# Patient Record
Sex: Female | Born: 1935 | Race: Black or African American | Hispanic: No | Smoking: Never smoker
Health system: Southern US, Community
[De-identification: ages and names within clinical notes are randomized; demographics above are authoritative.]

## PROBLEM LIST (undated history)

## (undated) DIAGNOSIS — I10 Essential (primary) hypertension: Secondary | ICD-10-CM

## (undated) DIAGNOSIS — E118 Type 2 diabetes mellitus with unspecified complications: Secondary | ICD-10-CM

## (undated) DIAGNOSIS — E785 Hyperlipidemia, unspecified: Secondary | ICD-10-CM

## (undated) DIAGNOSIS — I2510 Atherosclerotic heart disease of native coronary artery without angina pectoris: Secondary | ICD-10-CM

## (undated) DIAGNOSIS — K635 Polyp of colon: Secondary | ICD-10-CM

## (undated) DIAGNOSIS — H409 Unspecified glaucoma: Secondary | ICD-10-CM

## (undated) HISTORY — PX: APPENDECTOMY: SHX54

## (undated) HISTORY — PX: PARATHYROIDECTOMY: SHX19

## (undated) HISTORY — DX: Polyp of colon: K63.5

## (undated) HISTORY — PX: CORONARY ANGIOPLASTY: SHX604

---

## 1997-12-22 HISTORY — PX: BREAST BIOPSY: SHX20

## 2004-09-30 ENCOUNTER — Ambulatory Visit: Payer: Self-pay | Admitting: Unknown Physician Specialty

## 2005-01-07 ENCOUNTER — Ambulatory Visit: Payer: Self-pay | Admitting: Unknown Physician Specialty

## 2005-08-26 ENCOUNTER — Ambulatory Visit: Payer: Self-pay | Admitting: Unknown Physician Specialty

## 2005-10-01 ENCOUNTER — Ambulatory Visit: Payer: Self-pay | Admitting: Otolaryngology

## 2006-09-01 ENCOUNTER — Ambulatory Visit: Payer: Self-pay | Admitting: Unknown Physician Specialty

## 2007-09-06 ENCOUNTER — Ambulatory Visit: Payer: Self-pay | Admitting: Unknown Physician Specialty

## 2008-03-06 ENCOUNTER — Ambulatory Visit: Payer: Self-pay | Admitting: Unknown Physician Specialty

## 2008-04-17 ENCOUNTER — Ambulatory Visit: Payer: Self-pay | Admitting: Gastroenterology

## 2008-05-15 ENCOUNTER — Emergency Department: Payer: Self-pay | Admitting: Internal Medicine

## 2008-09-07 ENCOUNTER — Ambulatory Visit: Payer: Self-pay | Admitting: Unknown Physician Specialty

## 2008-09-11 ENCOUNTER — Ambulatory Visit: Payer: Self-pay | Admitting: Unknown Physician Specialty

## 2008-12-22 DIAGNOSIS — K635 Polyp of colon: Secondary | ICD-10-CM

## 2008-12-22 HISTORY — PX: COLONOSCOPY: SHX174

## 2008-12-22 HISTORY — DX: Polyp of colon: K63.5

## 2009-12-11 ENCOUNTER — Ambulatory Visit: Payer: Self-pay | Admitting: Internal Medicine

## 2010-12-12 ENCOUNTER — Ambulatory Visit: Payer: Self-pay | Admitting: Internal Medicine

## 2012-01-01 ENCOUNTER — Ambulatory Visit: Payer: Self-pay | Admitting: Internal Medicine

## 2012-07-14 ENCOUNTER — Ambulatory Visit: Payer: Self-pay | Admitting: Ophthalmology

## 2012-09-15 ENCOUNTER — Ambulatory Visit: Payer: Self-pay | Admitting: Ophthalmology

## 2013-01-12 ENCOUNTER — Ambulatory Visit: Payer: Self-pay | Admitting: Internal Medicine

## 2013-01-14 ENCOUNTER — Ambulatory Visit: Payer: Self-pay | Admitting: Internal Medicine

## 2014-01-13 ENCOUNTER — Ambulatory Visit: Payer: Self-pay | Admitting: Internal Medicine

## 2014-09-05 ENCOUNTER — Encounter: Payer: Self-pay | Admitting: *Deleted

## 2014-09-18 ENCOUNTER — Ambulatory Visit (INDEPENDENT_AMBULATORY_CARE_PROVIDER_SITE_OTHER): Payer: Medicare Other | Admitting: General Surgery

## 2014-09-18 ENCOUNTER — Encounter: Payer: Self-pay | Admitting: General Surgery

## 2014-09-18 VITALS — BP 120/80 | HR 84 | Resp 12 | Ht 66.0 in | Wt 165.0 lb

## 2014-09-18 DIAGNOSIS — Z1211 Encounter for screening for malignant neoplasm of colon: Secondary | ICD-10-CM

## 2014-09-18 DIAGNOSIS — D649 Anemia, unspecified: Secondary | ICD-10-CM

## 2014-09-18 MED ORDER — POLYETHYLENE GLYCOL 3350 17 GM/SCOOP PO POWD: ORAL | Status: DC

## 2014-09-18 NOTE — Patient Instructions (Addendum)
Colonoscopy A colonoscopy is an exam to look at the entire large intestine (colon). This exam can help find problems such as tumors, polyps, inflammation, and areas of bleeding. The exam takes about 1 hour.  LET [facility] CARE PROVIDER KNOW ABOUT:   Any allergies you have.  All medicines you are taking, including vitamins, herbs, eye drops, creams, and over-the-counter medicines.  Previous problems you or members of your family have had with the use of anesthetics.  Any blood disorders you have.  Previous surgeries you have had.  Medical conditions you have. RISKS AND COMPLICATIONS  Generally, this is a safe procedure. However, as with any procedure, complications can occur. Possible complications include:  Bleeding.  Tearing or rupture of the colon wall.  Reaction to medicines given during the exam.  Infection (rare). BEFORE THE PROCEDURE   Ask your health care provider about changing or stopping your regular medicines.  You may be prescribed an oral bowel prep. This involves drinking a large amount of medicated liquid, starting the day before your procedure. The liquid will cause you to have multiple loose stools until your stool is almost clear or light green. This cleans out your colon in preparation for the procedure.  Do not eat or drink anything else once you have started the bowel prep, unless your health care provider tells you it is safe to do so.  Arrange for someone to drive you home after the procedure. PROCEDURE   You will be given medicine to help you relax (sedative).  You will lie on your side with your knees bent.  A long, flexible tube with a light and camera on the end (colonoscope) will be inserted through the rectum and into the colon. The camera sends video back to a computer screen as it moves through the colon. The colonoscope also releases carbon dioxide gas to inflate the colon. This helps your health care provider see the area better.  During  the exam, your health care provider may take a small tissue sample (biopsy) to be examined under a microscope if any abnormalities are found.  The exam is finished when the entire colon has been viewed. AFTER THE PROCEDURE   Do not drive for 24 hours after the exam.  You may have a small amount of blood in your stool.  You may pass moderate amounts of gas and have mild abdominal cramping or bloating. This is caused by the gas used to inflate your colon during the exam.  Ask when your test results will be ready and how you will get your results. Make sure you get your test results. Document Released: 12/05/2000 Document Revised: 09/28/2013 Document Reviewed: 08/15/2013 [facility] Patient Information 2015 [place], [place]. This information is not intended to replace advice given to you by your health care provider. Make sure you discuss any questions you have with your health care provider.  Patient has been scheduled for an upper and lower endoscopy on 09-27-14 at [facility]. This patient has been asked to hold Onglyza day of prep and procedure.

## 2014-09-18 NOTE — Progress Notes (Addendum)
Patient ID: [name] [name], female   DOB: 1935-05-23, 79 y.o.   MRN: [id number]  Chief Complaint  Patient presents with  . Other    New Pt evaluation for screening colonoscopy    HPI [name] [name] is a 79 y.o. female who presents for an evaluation of a colonoscopy. Her last colonoscopy was performed at [facility] in 2010. The patient has a personal history of colon polyps. She denies any problems with the bowels at this time.   HPI  Past Medical History  Diagnosis Date  . Diabetes mellitus without complication   . Colon polyp 2010    Past Surgical History  Procedure Laterality Date  . Colonoscopy  2010  . Breast biopsy Right 1999    History reviewed. No pertinent family history.  Social History History  Substance Use Topics  . Smoking status: Never Smoker   . Smokeless tobacco: Never Used  . Alcohol Use: No    No Known Allergies  Current Outpatient Prescriptions  Medication Sig Dispense Refill  . acetaminophen (TYLENOL) 500 MG tablet Take 500 mg by mouth every 6 (six) hours as needed.      . Amlodipine-Valsartan-HCTZ 10-320-25 MG TABS Take 1 tablet by mouth daily.      . Calcium Carbonate-Vitamin D (CALCIUM + D PO) Take 1 tablet by mouth daily.      . Cholecalciferol (VITAMIN D-3) 1000 UNITS CAPS Take 1 capsule by mouth 2 (two) times daily.      . ONGLYZA 5 MG TABS tablet Take 1 tablet by mouth daily.      Marland Kitchen VIMOVO 500-20 MG TBEC Take 1 tablet by mouth daily.      . polyethylene glycol powder (GLYCOLAX/MIRALAX) powder 255 grams one bottle for colonoscopy prep  255 g  0   No current facility-administered medications for this visit.    Review of Systems Review of Systems  Constitutional: Negative.   Respiratory: Negative.   Cardiovascular: Negative.   Gastrointestinal: Negative.     Blood pressure 120/80, pulse 84, resp. rate 12, height 5\' 6"  (1.676 m), weight 165 lb (74.844 kg).  Physical Exam Physical Exam  Constitutional: She is oriented to person, place,  and time. She appears well-developed and well-nourished.  Eyes: Conjunctivae are normal. No scleral icterus.  Neck: Neck supple.  Cardiovascular: Normal rate, regular rhythm and normal heart sounds.   Pulmonary/Chest: Effort normal and breath sounds normal.  Abdominal: Soft. Normal appearance and bowel sounds are normal. There is no tenderness.  Neurological: She is alert and oriented to person, place, and time.  Skin: Skin is warm and dry.    Data Reviewed Full discussion with PCP.  Colonoscopy dated April 17, 2008 showed 2-9 mm polyps in the cecum which showed active inflammation and crypt hyperplasia. No granulomas.  Assessment    Microcytic anemia her PCP report.     Plan    Patient has been scheduled for an upper and lower endoscopy on 09-27-14 at [facility]. This patient has been asked to hold Onglyza day of prep and procedure.      PCP/Ref MD: Henderson Baltimore 09/19/2014, 8:32 PM

## 2014-09-19 ENCOUNTER — Other Ambulatory Visit: Payer: Self-pay | Admitting: General Surgery

## 2014-09-19 DIAGNOSIS — D649 Anemia, unspecified: Secondary | ICD-10-CM | POA: Insufficient documentation

## 2014-09-19 DIAGNOSIS — Z1211 Encounter for screening for malignant neoplasm of colon: Secondary | ICD-10-CM

## 2014-09-26 ENCOUNTER — Ambulatory Visit (INDEPENDENT_AMBULATORY_CARE_PROVIDER_SITE_OTHER): Payer: Medicare Other | Admitting: *Deleted

## 2014-09-26 DIAGNOSIS — D649 Anemia, unspecified: Secondary | ICD-10-CM

## 2014-09-26 LAB — POC HEMOCCULT BLD/STL (HOME/3-CARD/SCREEN)
CARD #2 DATE: NEGATIVE
Card #3 Date: NEGATIVE
OCCULT BLOOD DATE: NEGATIVE

## 2014-09-26 NOTE — Patient Instructions (Signed)
The patient is aware to call back for any questions or concerns.  

## 2014-09-26 NOTE — Progress Notes (Signed)
stool cards negative

## 2014-09-27 ENCOUNTER — Ambulatory Visit: Payer: Self-pay | Admitting: General Surgery

## 2014-09-27 DIAGNOSIS — D509 Iron deficiency anemia, unspecified: Secondary | ICD-10-CM

## 2014-09-27 DIAGNOSIS — D120 Benign neoplasm of cecum: Secondary | ICD-10-CM

## 2014-09-28 ENCOUNTER — Encounter: Payer: Self-pay | Admitting: General Surgery

## 2014-09-29 ENCOUNTER — Encounter: Payer: Self-pay | Admitting: General Surgery

## 2014-09-29 LAB — PATHOLOGY REPORT

## 2014-10-02 ENCOUNTER — Telehealth: Payer: Self-pay

## 2014-10-02 NOTE — Telephone Encounter (Signed)
Message copied by Lesly Rubenstein on Mon Oct 02, 2014  4:35 PM ------      Message from: [place], Forest Gleason      Created: Mon Oct 02, 2014  2:02 PM       Please notify the patient of the polyp removed at the time of her recent colonoscopy was benign. Confirm she tolerated the procedure well. Follow up-year-old begun as needed basis. ------

## 2014-10-02 NOTE — Telephone Encounter (Signed)
Notified patient as instructed, patient pleased °

## 2014-10-23 ENCOUNTER — Encounter: Payer: Self-pay | Admitting: General Surgery

## 2015-01-15 ENCOUNTER — Ambulatory Visit: Payer: Self-pay | Admitting: Internal Medicine

## 2015-06-18 ENCOUNTER — Ambulatory Visit
Admission: EM | Admit: 2015-06-18 | Discharge: 2015-06-18 | Disposition: A | Discharge status: Home / Self Care | Payer: Medicare Other | Attending: Internal Medicine | Admitting: Internal Medicine

## 2015-06-18 DIAGNOSIS — S39012A Strain of muscle, fascia and tendon of lower back, initial encounter: Secondary | ICD-10-CM | POA: Diagnosis not present

## 2015-06-18 DIAGNOSIS — M5430 Sciatica, unspecified side: Secondary | ICD-10-CM

## 2015-06-18 HISTORY — DX: Unspecified glaucoma: H40.9

## 2015-06-18 HISTORY — DX: Essential (primary) hypertension: I10

## 2015-06-18 MED ORDER — NAPROXEN 375 MG PO TABS: 375.0000 mg | ORAL_TABLET | Freq: Two times a day (BID) | ORAL | Status: DC

## 2015-06-18 MED ORDER — PREDNISONE 50 MG PO TABS: 50.0000 mg | ORAL_TABLET | Freq: Every day | ORAL | Status: DC

## 2015-06-18 NOTE — ED Notes (Signed)
States was helping husband out of his wheelchair a week ago Thursday. By Tuesday had pain over right buttock and down posterior right leg. Also occasionally radiated down posterior left leg

## 2015-06-18 NOTE — ED Provider Notes (Signed)
CSN: [id number]     Arrival date & time 06/18/15  1242 History   First MD Initiated Contact with Patient 06/18/15 1336     Chief Complaint  Patient presents with  . Back Pain   HPI Patient is a 80 year old lady with past medical history notable for diabetes diagnosed about 9 years ago, on oral agents. 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 She was moving him around about a week ago, and had the onset of low back pain radiating into both buttocks and down into the proximal posterior thighs. Pain is worse on the right. Changing positions seems to make it worse. She has had no change in bowel function, or bladder function. No loss of sensation. She is able to walk independently. She has been taking Tylenol, and tried some Tylenol 3 from her primary care provider, which she felt were not helpful.  Past Medical History  Diagnosis Date  . Diabetes mellitus without complication   . Colon polyp 2010  . Hypertension   . Glaucoma    Past Surgical History  Procedure Laterality Date  . Colonoscopy  2010  . Breast biopsy Right 1999  . Appendectomy    . Parathyroidectomy      1986   Family History  Problem Relation Age of Onset  . Diabetes Father   . Diabetes Sister    History  Substance Use Topics  . Smoking status: Never Smoker   . Smokeless tobacco: Never Used  . Alcohol Use: No   OB History    Gravida Para Term Preterm AB TAB SAB Ectopic Multiple Living   4 4        4       Obstetric Comments   1st Menstrual Cycle: 1st Pregnancy:  20     Review of Systems  All other systems reviewed and are negative.   Allergies  Review of patient's allergies indicates no known allergies.  Home Medications   Prior to Admission medications   Medication Sig Start Date End Date Taking? Authorizing Provider  acetaminophen (TYLENOL) 500 MG tablet Take 500 mg by mouth every 6 (six) hours as needed.   Yes Historical Provider, MD  Calcium Carbonate-Vitamin D  (CALCIUM + D PO) Take 1 tablet by mouth daily.   Yes Historical Provider, MD  latanoprost (XALATAN) 0.005 % ophthalmic solution 1 drop at bedtime.   Yes Historical Provider, MD  olmesartan-hydrochlorothiazide (BENICAR HCT) 40-12.5 MG per tablet Take 1 tablet by mouth daily.   Yes Historical Provider, MD  ONGLYZA 5 MG TABS tablet Take 1 tablet by mouth daily. 08/06/14  Yes Historical Provider, MD  VIMOVO 500-20 MG TBEC Take 1 tablet by mouth daily. 07/14/14  Yes Historical Provider, MD         polyethylene glycol powder (GLYCOLAX/MIRALAX) powder 255 grams one bottle for colonoscopy prep 09/18/14   Robert Bellow, MD     BP 173/78 mmHg  Pulse 90  Temp(Src) 97.7 F (36.5 C) (Tympanic)  Resp 16  Ht 5\' 7"  (1.702 m)  Wt 168 lb (76.204 kg)  BMI 26.31 kg/m2  SpO2 99% Physical Exam  Constitutional: She is oriented to person, place, and time. No distress.  Alert, nicely groomed  HENT:  Head: Atraumatic.  Eyes:  Conjugate gaze, no eye redness/drainage  Neck: Neck supple.  Cardiovascular: Normal rate.   Pulmonary/Chest: No respiratory distress.  Abdominal: She exhibits no distension.  Musculoskeletal: Normal range of motion.  Back:       Arms: No leg swelling Leg strength is 5 out of 5 and symmetric in the bilateral lower extremities, proximal and distal No tenderness to palpation over the low midline spine, which is where the pain is. Mild bilateral paralumbar spasm palpable. No rash. No bruising  Neurological: She is alert and oriented to person, place, and time.  Gait is steady, no foot drop, no staggering. Patient is able to easily rise from chair and walk across the room.  Skin: Skin is warm and dry.  No cyanosis  Nursing note and vitals reviewed.   ED Course  Procedures   MDM   1. Low back strain, initial encounter   2. Sciatica neuralgia, unspecified laterality    Prescription for a short pulse of prednisone and some Naprosyn sent to the pharmacy. Patient might  benefit from some physical therapy if she is not improving in 7-10 days. She should go to the emergency room if leg weakness or loss of bowel or bladder control occurs.    Sherlene Shams, MD 06/18/15 [phone]

## 2015-06-18 NOTE — Discharge Instructions (Signed)
Prescriptions for prednisone (for 3-5 days, for pain down legs) and naproxen (anti inflammatory, for pain) sent to the [facility] in [place]. Recheck or followup with primary care provider in 7-10 days if not improving. Go to ER if weakness or clumsiness of legs occurs, or change in bowel/bladder function.   Sciatica Sciatica is pain, weakness, numbness, or tingling along your sciatic nerve. The nerve starts in the lower back and runs down the back of each leg. Nerve damage or certain conditions pinch or put pressure on the sciatic nerve. This causes the pain, weakness, and other discomforts of sciatica. HOME CARE   Only take medicine as told by your doctor.  Apply ice to the affected area for 20 minutes. Do this 3-4 times a day for the first 48-72 hours. Then try heat in the same way.  Exercise, stretch, or do your usual activities if these do not make your pain worse.  Go to physical therapy as told by your doctor.  Keep all doctor visits as told.  Do not wear high heels or shoes that are not supportive.  Get a firm mattress if your mattress is too soft to lessen pain and discomfort. GET HELP RIGHT AWAY IF:   You cannot control when you poop (bowel movement) or pee (urinate).  You have more weakness in your lower back, lower belly (pelvis), butt (buttocks), or legs.  You have redness or puffiness (swelling) of your back.  You have a burning feeling when you pee.  You have pain that gets worse when you lie down.  You have pain that wakes you from your sleep.  Your pain is worse than past pain.  Your pain lasts longer than 4 weeks.  You are suddenly losing weight without reason. MAKE SURE YOU:   Understand these instructions.  Will watch this condition.  Will get help right away if you are not doing well or get worse. Document Released: 09/16/2008 Document Revised: 06/08/2012 Document Reviewed: 04/18/2012 [facility] Patient Information 2015 [place], [place]. This information  is not intended to replace advice given to you by your health care provider. Make sure you discuss any questions you have with your health care provider.

## 2016-04-15 ENCOUNTER — Other Ambulatory Visit: Payer: Self-pay | Admitting: Neurology

## 2016-04-15 DIAGNOSIS — R262 Difficulty in walking, not elsewhere classified: Secondary | ICD-10-CM

## 2016-04-16 DIAGNOSIS — E785 Hyperlipidemia, unspecified: Secondary | ICD-10-CM | POA: Insufficient documentation

## 2016-04-16 DIAGNOSIS — E1140 Type 2 diabetes mellitus with diabetic neuropathy, unspecified: Secondary | ICD-10-CM | POA: Insufficient documentation

## 2016-05-02 ENCOUNTER — Ambulatory Visit
Admission: RE | Admit: 2016-05-02 | Discharge: 2016-05-02 | Disposition: A | Discharge status: Home / Self Care | Payer: Medicare Other | Source: Ambulatory Visit | Attending: Neurology | Admitting: Neurology

## 2016-05-02 DIAGNOSIS — G9389 Other specified disorders of brain: Secondary | ICD-10-CM | POA: Insufficient documentation

## 2016-05-02 DIAGNOSIS — R9082 White matter disease, unspecified: Secondary | ICD-10-CM | POA: Diagnosis not present

## 2016-05-02 DIAGNOSIS — R262 Difficulty in walking, not elsewhere classified: Secondary | ICD-10-CM

## 2016-05-14 DIAGNOSIS — R278 Other lack of coordination: Secondary | ICD-10-CM | POA: Insufficient documentation

## 2017-02-03 ENCOUNTER — Encounter
Admission: RE | Disposition: A | Discharge status: Other Acute Inpatient Hospital | Payer: Self-pay | Source: Ambulatory Visit | Attending: Internal Medicine

## 2017-02-03 ENCOUNTER — Other Ambulatory Visit: Payer: Self-pay | Admitting: Cardiology

## 2017-02-03 ENCOUNTER — Ambulatory Visit
Admission: RE | Admit: 2017-02-03 | Discharge: 2017-02-03 | Disposition: A | Discharge status: Home / Self Care | Payer: Medicare Other | Source: Ambulatory Visit | Attending: Cardiology | Admitting: Cardiology

## 2017-02-03 ENCOUNTER — Inpatient Hospital Stay (HOSPITAL_COMMUNITY)
Admission: AD | Admit: 2017-02-03 | Discharge: 2017-03-05 | DRG: 216 | Disposition: A | Discharge status: Skilled Nursing Facility | Payer: Medicare Other | Source: Other Acute Inpatient Hospital | Attending: Surgery | Admitting: Surgery

## 2017-02-03 ENCOUNTER — Encounter (HOSPITAL_COMMUNITY): Payer: Self-pay | Admitting: Physician Assistant

## 2017-02-03 ENCOUNTER — Encounter: Payer: Self-pay | Admitting: *Deleted

## 2017-02-03 ENCOUNTER — Ambulatory Visit (INDEPENDENT_AMBULATORY_CARE_PROVIDER_SITE_OTHER): Payer: Medicare Other | Admitting: Cardiology

## 2017-02-03 ENCOUNTER — Inpatient Hospital Stay
Admission: RE | Admit: 2017-02-03 | Discharge: 2017-02-03 | DRG: 282 | Disposition: A | Discharge status: Other Acute Inpatient Hospital | Payer: Medicare Other | Source: Ambulatory Visit | Attending: Internal Medicine | Admitting: Internal Medicine

## 2017-02-03 ENCOUNTER — Encounter: Payer: Self-pay | Admitting: Cardiology

## 2017-02-03 VITALS — BP 116/64 | HR 72 | Ht 66.0 in | Wt 158.8 lb

## 2017-02-03 DIAGNOSIS — I2510 Atherosclerotic heart disease of native coronary artery without angina pectoris: Secondary | ICD-10-CM | POA: Diagnosis present

## 2017-02-03 DIAGNOSIS — R570 Cardiogenic shock: Secondary | ICD-10-CM

## 2017-02-03 DIAGNOSIS — I340 Nonrheumatic mitral (valve) insufficiency: Secondary | ICD-10-CM

## 2017-02-03 DIAGNOSIS — J40 Bronchitis, not specified as acute or chronic: Secondary | ICD-10-CM | POA: Diagnosis present

## 2017-02-03 DIAGNOSIS — R7989 Other specified abnormal findings of blood chemistry: Secondary | ICD-10-CM | POA: Diagnosis present

## 2017-02-03 DIAGNOSIS — K7200 Acute and subacute hepatic failure without coma: Secondary | ICD-10-CM | POA: Diagnosis present

## 2017-02-03 DIAGNOSIS — B370 Candidal stomatitis: Secondary | ICD-10-CM | POA: Diagnosis present

## 2017-02-03 DIAGNOSIS — J9811 Atelectasis: Secondary | ICD-10-CM | POA: Diagnosis present

## 2017-02-03 DIAGNOSIS — Z833 Family history of diabetes mellitus: Secondary | ICD-10-CM

## 2017-02-03 DIAGNOSIS — I511 Rupture of chordae tendineae, not elsewhere classified: Secondary | ICD-10-CM | POA: Diagnosis present

## 2017-02-03 DIAGNOSIS — Z952 Presence of prosthetic heart valve: Secondary | ICD-10-CM

## 2017-02-03 DIAGNOSIS — Z7982 Long term (current) use of aspirin: Secondary | ICD-10-CM

## 2017-02-03 DIAGNOSIS — I10 Essential (primary) hypertension: Secondary | ICD-10-CM | POA: Diagnosis present

## 2017-02-03 DIAGNOSIS — I200 Unstable angina: Secondary | ICD-10-CM | POA: Diagnosis present

## 2017-02-03 DIAGNOSIS — R0989 Other specified symptoms and signs involving the circulatory and respiratory systems: Secondary | ICD-10-CM | POA: Diagnosis present

## 2017-02-03 DIAGNOSIS — I5021 Acute systolic (congestive) heart failure: Secondary | ICD-10-CM | POA: Diagnosis not present

## 2017-02-03 DIAGNOSIS — R34 Anuria and oliguria: Secondary | ICD-10-CM | POA: Diagnosis present

## 2017-02-03 DIAGNOSIS — D6489 Other specified anemias: Secondary | ICD-10-CM | POA: Diagnosis present

## 2017-02-03 DIAGNOSIS — J9601 Acute respiratory failure with hypoxia: Secondary | ICD-10-CM

## 2017-02-03 DIAGNOSIS — J13 Pneumonia due to Streptococcus pneumoniae: Secondary | ICD-10-CM | POA: Diagnosis present

## 2017-02-03 DIAGNOSIS — I25110 Atherosclerotic heart disease of native coronary artery with unstable angina pectoris: Secondary | ICD-10-CM | POA: Diagnosis present

## 2017-02-03 DIAGNOSIS — R011 Cardiac murmur, unspecified: Secondary | ICD-10-CM | POA: Diagnosis present

## 2017-02-03 DIAGNOSIS — N990 Postprocedural (acute) (chronic) kidney failure: Secondary | ICD-10-CM | POA: Diagnosis present

## 2017-02-03 DIAGNOSIS — I959 Hypotension, unspecified: Secondary | ICD-10-CM | POA: Diagnosis present

## 2017-02-03 DIAGNOSIS — G9341 Metabolic encephalopathy: Secondary | ICD-10-CM | POA: Diagnosis present

## 2017-02-03 DIAGNOSIS — I214 Non-ST elevation (NSTEMI) myocardial infarction: Secondary | ICD-10-CM | POA: Diagnosis present

## 2017-02-03 DIAGNOSIS — Z951 Presence of aortocoronary bypass graft: Secondary | ICD-10-CM

## 2017-02-03 DIAGNOSIS — Z09 Encounter for follow-up examination after completed treatment for conditions other than malignant neoplasm: Secondary | ICD-10-CM

## 2017-02-03 DIAGNOSIS — Z7901 Long term (current) use of anticoagulants: Secondary | ICD-10-CM

## 2017-02-03 DIAGNOSIS — J9690 Respiratory failure, unspecified, unspecified whether with hypoxia or hypercapnia: Secondary | ICD-10-CM

## 2017-02-03 DIAGNOSIS — I110 Hypertensive heart disease with heart failure: Secondary | ICD-10-CM | POA: Diagnosis present

## 2017-02-03 DIAGNOSIS — E11649 Type 2 diabetes mellitus with hypoglycemia without coma: Secondary | ICD-10-CM | POA: Diagnosis present

## 2017-02-03 DIAGNOSIS — Z9689 Presence of other specified functional implants: Secondary | ICD-10-CM

## 2017-02-03 DIAGNOSIS — E1165 Type 2 diabetes mellitus with hyperglycemia: Secondary | ICD-10-CM | POA: Diagnosis present

## 2017-02-03 DIAGNOSIS — E874 Mixed disorder of acid-base balance: Secondary | ICD-10-CM | POA: Diagnosis present

## 2017-02-03 DIAGNOSIS — H409 Unspecified glaucoma: Secondary | ICD-10-CM | POA: Diagnosis present

## 2017-02-03 DIAGNOSIS — E119 Type 2 diabetes mellitus without complications: Secondary | ICD-10-CM | POA: Diagnosis present

## 2017-02-03 DIAGNOSIS — N189 Chronic kidney disease, unspecified: Secondary | ICD-10-CM | POA: Diagnosis present

## 2017-02-03 DIAGNOSIS — R4702 Dysphasia: Secondary | ICD-10-CM | POA: Diagnosis present

## 2017-02-03 DIAGNOSIS — I509 Heart failure, unspecified: Secondary | ICD-10-CM | POA: Diagnosis not present

## 2017-02-03 DIAGNOSIS — R9431 Abnormal electrocardiogram [ECG] [EKG]: Secondary | ICD-10-CM | POA: Diagnosis present

## 2017-02-03 DIAGNOSIS — Z79899 Other long term (current) drug therapy: Secondary | ICD-10-CM

## 2017-02-03 DIAGNOSIS — R740 Nonspecific elevation of levels of transaminase and lactic acid dehydrogenase [LDH]: Secondary | ICD-10-CM | POA: Diagnosis present

## 2017-02-03 DIAGNOSIS — M1990 Unspecified osteoarthritis, unspecified site: Secondary | ICD-10-CM | POA: Diagnosis present

## 2017-02-03 DIAGNOSIS — R490 Dysphonia: Secondary | ICD-10-CM | POA: Diagnosis not present

## 2017-02-03 DIAGNOSIS — I471 Supraventricular tachycardia: Secondary | ICD-10-CM | POA: Diagnosis present

## 2017-02-03 DIAGNOSIS — J3801 Paralysis of vocal cords and larynx, unilateral: Secondary | ICD-10-CM | POA: Diagnosis not present

## 2017-02-03 DIAGNOSIS — D6959 Other secondary thrombocytopenia: Secondary | ICD-10-CM | POA: Diagnosis present

## 2017-02-03 DIAGNOSIS — I5043 Acute on chronic combined systolic (congestive) and diastolic (congestive) heart failure: Secondary | ICD-10-CM | POA: Diagnosis present

## 2017-02-03 DIAGNOSIS — I493 Ventricular premature depolarization: Secondary | ICD-10-CM | POA: Diagnosis present

## 2017-02-03 DIAGNOSIS — N170 Acute kidney failure with tubular necrosis: Secondary | ICD-10-CM | POA: Diagnosis present

## 2017-02-03 DIAGNOSIS — Z4659 Encounter for fitting and adjustment of other gastrointestinal appliance and device: Secondary | ICD-10-CM

## 2017-02-03 DIAGNOSIS — E876 Hypokalemia: Secondary | ICD-10-CM | POA: Diagnosis present

## 2017-02-03 DIAGNOSIS — R29810 Facial weakness: Secondary | ICD-10-CM | POA: Diagnosis present

## 2017-02-03 DIAGNOSIS — Z01810 Encounter for preprocedural cardiovascular examination: Secondary | ICD-10-CM | POA: Diagnosis not present

## 2017-02-03 DIAGNOSIS — I081 Rheumatic disorders of both mitral and tricuspid valves: Secondary | ICD-10-CM | POA: Diagnosis present

## 2017-02-03 DIAGNOSIS — J154 Pneumonia due to other streptococci: Secondary | ICD-10-CM | POA: Diagnosis not present

## 2017-02-03 DIAGNOSIS — E892 Postprocedural hypoparathyroidism: Secondary | ICD-10-CM | POA: Diagnosis present

## 2017-02-03 DIAGNOSIS — Z86010 Personal history of colonic polyps: Secondary | ICD-10-CM

## 2017-02-03 DIAGNOSIS — Z9889 Other specified postprocedural states: Secondary | ICD-10-CM

## 2017-02-03 DIAGNOSIS — N179 Acute kidney failure, unspecified: Secondary | ICD-10-CM | POA: Diagnosis not present

## 2017-02-03 DIAGNOSIS — Z9049 Acquired absence of other specified parts of digestive tract: Secondary | ICD-10-CM

## 2017-02-03 DIAGNOSIS — E118 Type 2 diabetes mellitus with unspecified complications: Secondary | ICD-10-CM | POA: Diagnosis present

## 2017-02-03 DIAGNOSIS — I480 Paroxysmal atrial fibrillation: Secondary | ICD-10-CM | POA: Diagnosis present

## 2017-02-03 DIAGNOSIS — J90 Pleural effusion, not elsewhere classified: Secondary | ICD-10-CM

## 2017-02-03 HISTORY — DX: Atherosclerotic heart disease of native coronary artery without angina pectoris: I25.10

## 2017-02-03 HISTORY — DX: Type 2 diabetes mellitus with unspecified complications: E11.8

## 2017-02-03 HISTORY — PX: LEFT HEART CATH AND CORONARY ANGIOGRAPHY: CATH118249

## 2017-02-03 LAB — GLUCOSE, CAPILLARY: Glucose-Capillary: 146 mg/dL — ABNORMAL HIGH (ref 65–99)

## 2017-02-03 LAB — CBC WITH DIFFERENTIAL/PLATELET
Basophils Absolute: 0 10*3/uL (ref 0–0.1)
Basophils Relative: 0 %
Eosinophils Absolute: 0.2 10*3/uL (ref 0–0.7)
Eosinophils Relative: 2 %
HEMATOCRIT: 36.8 % (ref 35.0–47.0)
HEMOGLOBIN: 11.4 g/dL — AB (ref 12.0–16.0)
LYMPHS ABS: 1.8 10*3/uL (ref 1.0–3.6)
LYMPHS PCT: 23 %
MCH: 22.6 pg — AB (ref 26.0–34.0)
MCHC: 31.1 g/dL — AB (ref 32.0–36.0)
MCV: 72.6 fL — AB (ref 80.0–100.0)
MONOS PCT: 12 %
Monocytes Absolute: 0.9 10*3/uL (ref 0.2–0.9)
NEUTROS ABS: 4.9 10*3/uL (ref 1.4–6.5)
NEUTROS PCT: 63 %
Platelets: 219 10*3/uL (ref 150–440)
RBC: 5.06 MIL/uL (ref 3.80–5.20)
RDW: 19.8 % — ABNORMAL HIGH (ref 11.5–14.5)
WBC: 7.8 10*3/uL (ref 3.6–11.0)

## 2017-02-03 LAB — TSH: TSH: 0.576 u[IU]/mL (ref 0.350–4.500)

## 2017-02-03 LAB — PROTIME-INR
INR: 1.11
Prothrombin Time: 14.4 seconds (ref 11.4–15.2)

## 2017-02-03 LAB — TROPONIN I: Troponin I: 6.48 ng/mL (ref <0.03)

## 2017-02-03 LAB — BASIC METABOLIC PANEL
ANION GAP: 8 (ref 5–15)
BUN: 29 mg/dL — ABNORMAL HIGH (ref 6–20)
CHLORIDE: 108 mmol/L (ref 101–111)
CO2: 26 mmol/L (ref 22–32)
Calcium: 9.5 mg/dL (ref 8.9–10.3)
Creatinine, Ser: 1.27 mg/dL — ABNORMAL HIGH (ref 0.44–1.00)
GFR calc non Af Amer: 39 mL/min — ABNORMAL LOW (ref >60)
GFR, EST AFRICAN AMERICAN: 45 mL/min — AB (ref >60)
GLUCOSE: 150 mg/dL — AB (ref 65–99)
Potassium: 3.6 mmol/L (ref 3.5–5.1)
Sodium: 142 mmol/L (ref 135–145)

## 2017-02-03 LAB — MAGNESIUM: MAGNESIUM: 2.1 mg/dL (ref 1.7–2.4)

## 2017-02-03 SURGERY — LEFT HEART CATH AND CORONARY ANGIOGRAPHY
Anesthesia: Moderate Sedation | Laterality: Left

## 2017-02-03 MED ORDER — HEPARIN SODIUM (PORCINE) 1000 UNIT/ML IJ SOLN
INTRAMUSCULAR | Status: AC
  Filled 2017-02-03: qty 1

## 2017-02-03 MED ORDER — LINAGLIPTIN 5 MG PO TABS
5.0000 mg | ORAL_TABLET | Freq: Every day | ORAL | Status: DC
Start: 2017-02-04 — End: 2017-02-05
  Administered 2017-02-04: 5 mg via ORAL
  Filled 2017-02-03: qty 1

## 2017-02-03 MED ORDER — FENTANYL CITRATE (PF) 100 MCG/2ML IJ SOLN
INTRAMUSCULAR | Status: DC | PRN
  Administered 2017-02-03: 25 ug via INTRAVENOUS

## 2017-02-03 MED ORDER — ATORVASTATIN CALCIUM 20 MG PO TABS
20.0000 mg | ORAL_TABLET | Freq: Every day | ORAL | Status: DC
  Filled 2017-02-03: qty 1

## 2017-02-03 MED ORDER — HEPARIN (PORCINE) IN NACL 100-0.45 UNIT/ML-% IJ SOLN
950.0000 [IU]/h | INTRAMUSCULAR | Status: DC
  Administered 2017-02-03: 850 [IU]/h via INTRAVENOUS
  Filled 2017-02-03: qty 250

## 2017-02-03 MED ORDER — ONDANSETRON HCL 4 MG/2ML IJ SOLN
4.0000 mg | Freq: Four times a day (QID) | INTRAMUSCULAR | Status: DC | PRN
  Administered 2017-02-04: 4 mg via INTRAVENOUS
  Filled 2017-02-03: qty 2

## 2017-02-03 MED ORDER — IRBESARTAN 300 MG PO TABS
300.0000 mg | ORAL_TABLET | Freq: Every day | ORAL | Status: DC
  Administered 2017-02-04: 300 mg via ORAL
  Filled 2017-02-03 (×2): qty 1

## 2017-02-03 MED ORDER — SODIUM CHLORIDE 0.9 % IV SOLN: 250.0000 mL | INTRAVENOUS | Status: DC | PRN

## 2017-02-03 MED ORDER — ASPIRIN 81 MG PO CHEW: 81.0000 mg | CHEWABLE_TABLET | Freq: Every day | ORAL | Status: DC

## 2017-02-03 MED ORDER — SODIUM CHLORIDE 0.9 % IV SOLN: INTRAVENOUS | Status: DC

## 2017-02-03 MED ORDER — MIDAZOLAM HCL 2 MG/2ML IJ SOLN
INTRAMUSCULAR | Status: DC | PRN
  Administered 2017-02-03: 1 mg via INTRAVENOUS

## 2017-02-03 MED ORDER — SODIUM CHLORIDE 0.9% FLUSH
3.0000 mL | Freq: Two times a day (BID) | INTRAVENOUS | Status: DC
  Administered 2017-02-03: 3 mL via INTRAVENOUS

## 2017-02-03 MED ORDER — MIDAZOLAM HCL 2 MG/2ML IJ SOLN
INTRAMUSCULAR | Status: AC
  Filled 2017-02-03: qty 2

## 2017-02-03 MED ORDER — ACETAMINOPHEN 325 MG PO TABS
650.0000 mg | ORAL_TABLET | ORAL | Status: DC | PRN
  Administered 2017-02-03 – 2017-02-04 (×2): 650 mg via ORAL
  Filled 2017-02-03 (×2): qty 2

## 2017-02-03 MED ORDER — NITROGLYCERIN 0.4 MG SL SUBL: 0.4000 mg | SUBLINGUAL_TABLET | SUBLINGUAL | Status: DC | PRN

## 2017-02-03 MED ORDER — ENSURE ENLIVE PO LIQD
237.0000 mL | Freq: Two times a day (BID) | ORAL | Status: DC
  Administered 2017-02-04 (×2): 237 mL via ORAL

## 2017-02-03 MED ORDER — ASPIRIN EC 81 MG PO TBEC
81.0000 mg | DELAYED_RELEASE_TABLET | Freq: Every day | ORAL | Status: DC
  Administered 2017-02-04: 81 mg via ORAL
  Filled 2017-02-03: qty 1

## 2017-02-03 MED ORDER — HYDROCHLOROTHIAZIDE 12.5 MG PO CAPS
12.5000 mg | ORAL_CAPSULE | Freq: Every day | ORAL | Status: DC
  Administered 2017-02-04: 12.5 mg via ORAL
  Filled 2017-02-03: qty 1

## 2017-02-03 MED ORDER — ATORVASTATIN CALCIUM 80 MG PO TABS
80.0000 mg | ORAL_TABLET | Freq: Every day | ORAL | Status: DC
  Administered 2017-02-03 – 2017-02-04 (×2): 80 mg via ORAL
  Filled 2017-02-03 (×2): qty 1

## 2017-02-03 MED ORDER — FENTANYL CITRATE (PF) 100 MCG/2ML IJ SOLN
INTRAMUSCULAR | Status: AC
  Filled 2017-02-03: qty 2

## 2017-02-03 MED ORDER — PNEUMOCOCCAL VAC POLYVALENT 25 MCG/0.5ML IJ INJ: 0.5000 mL | INJECTION | INTRAMUSCULAR | Status: DC | PRN

## 2017-02-03 MED ORDER — HEPARIN SODIUM (PORCINE) 1000 UNIT/ML IJ SOLN
INTRAMUSCULAR | Status: DC | PRN
  Administered 2017-02-03: 3500 [IU] via INTRAVENOUS

## 2017-02-03 MED ORDER — SODIUM CHLORIDE 0.9% FLUSH: 3.0000 mL | Freq: Two times a day (BID) | INTRAVENOUS | Status: DC

## 2017-02-03 MED ORDER — VERAPAMIL HCL 2.5 MG/ML IV SOLN
INTRAVENOUS | Status: AC
  Filled 2017-02-03: qty 2

## 2017-02-03 MED ORDER — ACETAMINOPHEN 325 MG PO TABS: 650.0000 mg | ORAL_TABLET | ORAL | Status: DC | PRN

## 2017-02-03 MED ORDER — IOPAMIDOL (ISOVUE-300) INJECTION 61%
INTRAVENOUS | Status: DC | PRN
  Administered 2017-02-03: 95 mL via INTRA_ARTERIAL

## 2017-02-03 MED ORDER — ASPIRIN 81 MG PO CHEW
81.0000 mg | CHEWABLE_TABLET | ORAL | Status: AC
  Administered 2017-02-03: 81 mg via ORAL

## 2017-02-03 MED ORDER — CARVEDILOL 3.125 MG PO TABS
3.1250 mg | ORAL_TABLET | Freq: Two times a day (BID) | ORAL | Status: DC
  Administered 2017-02-03 – 2017-02-04 (×3): 3.125 mg via ORAL
  Filled 2017-02-03 (×3): qty 1

## 2017-02-03 MED ORDER — ASPIRIN 81 MG PO CHEW
CHEWABLE_TABLET | ORAL | Status: AC
  Filled 2017-02-03: qty 1

## 2017-02-03 MED ORDER — SODIUM CHLORIDE 0.9% FLUSH: 3.0000 mL | INTRAVENOUS | Status: DC | PRN

## 2017-02-03 MED ORDER — HEPARIN (PORCINE) IN NACL 2-0.9 UNIT/ML-% IJ SOLN
INTRAMUSCULAR | Status: AC
  Filled 2017-02-03: qty 500

## 2017-02-03 MED ORDER — CALCIUM CARBONATE-VITAMIN D 500-200 MG-UNIT PO TABS
1.0000 | ORAL_TABLET | Freq: Every day | ORAL | Status: DC
  Administered 2017-02-04: 1 via ORAL
  Filled 2017-02-03 (×3): qty 1

## 2017-02-03 MED ORDER — LATANOPROST 0.005 % OP SOLN
1.0000 [drp] | Freq: Every day | OPHTHALMIC | Status: DC
  Administered 2017-02-03 – 2017-02-04 (×2): 1 [drp] via OPHTHALMIC
  Filled 2017-02-03: qty 2.5

## 2017-02-03 SURGICAL SUPPLY — 10 items
CATH 5F 110X4 TIG (CATHETERS) ×2 IMPLANT
CATH 5FR JR4 DIAGNOSTIC (CATHETERS) ×2 IMPLANT
CATH INFINITI 5 FR 3DRC (CATHETERS) ×2 IMPLANT
CATH INFINITI 5FR AL1 (CATHETERS) ×2 IMPLANT
DEVICE RAD TR BAND REGULAR (VASCULAR PRODUCTS) ×2 IMPLANT
GLIDESHEATH SLEND SS 6F .021 (SHEATH) ×2 IMPLANT
KIT MANI 3VAL PERCEP (MISCELLANEOUS) ×2 IMPLANT
PACK CARDIAC CATH (CUSTOM PROCEDURE TRAY) ×2 IMPLANT
WIRE HITORQ VERSACORE ST 145CM (WIRE) ×2 IMPLANT
WIRE ROSEN-J .035X260CM (WIRE) ×2 IMPLANT

## 2017-02-03 NOTE — H&P (View-Only) (Signed)
Cardiology Office Note   Date:  02/03/2017   ID:  [name] [name], DOB 1935-04-22, MRN [id number]  Referring Doctor:  Marden Noble, MD   Cardiologist:   Wende Bushy, MD   Reason for consultation:  Chief Complaint  Patient presents with  . other    New patient. referral from Dr Jerene Dilling office Patient states Over the weekend pt has been having CP when lifting or pushing. Meds reviewed verbally with patient.       History of Present Illness: [name] [name] is a 82 y.o. female who presents for Chest pain. Symptoms started 3 days prior to office visit. 01/31/2017. She noted sudden onset of chest pain. She describes it as a sharp pain and a tightness across the center of her chest, 5 out of 10 severity, lasting a few seconds to minutes at a time. This radiated up to the throat. This occurred with physical exertion, mopping the floor, or pushing husband's wheelchair. There was associated significant shortness of breath with the chest pain. Eventually, the symptoms went away with rest but recurred with any physical exertion. This happened again Sunday and then Monday. 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 Since this started happening, she has felt overall weak and tired.  She did call her PCP 02/02/2017 and was advised consultation with cardiology today.  She has been taking aspirin 81 mg by mouth daily. No PND, orthopnea, edema. No palpitations. No history of loss of consciousness.   ROS:  Please see the history of present illness. Aside from mentioned under HPI, all other systems are reviewed and negative.     Past Medical History:  Diagnosis Date  . Colon polyp 2010  . Diabetes mellitus without complication (HCC)   . Glaucoma   . Hypertension     Past Surgical History:  Procedure Laterality Date  . APPENDECTOMY    . BREAST BIOPSY Right 1999  . COLONOSCOPY  2010  . PARATHYROIDECTOMY     [age]     reports that she has  never smoked. She has never used smokeless tobacco. She reports that she does not drink alcohol or use drugs.   family history includes Diabetes in her father and sister.   Outpatient Medications Prior to Visit  Medication Sig Dispense Refill  . acetaminophen (TYLENOL) 500 MG tablet Take 500 mg by mouth every 6 (six) hours as needed.    . Calcium Carbonate-Vitamin D (CALCIUM + D PO) Take 1 tablet by mouth daily.    Marland Kitchen latanoprost (XALATAN) 0.005 % ophthalmic solution 1 drop at bedtime.    . naproxen (NAPROSYN) 375 MG tablet Take 1 tablet (375 mg total) by mouth 2 (two) times daily. (Patient not taking: Reported on 02/03/2017) 20 tablet 0  . olmesartan-hydrochlorothiazide (BENICAR HCT) 40-12.5 MG per tablet Take 1 tablet by mouth daily.    . ONGLYZA 5 MG TABS tablet Take 1 tablet by mouth daily.    . polyethylene glycol powder (GLYCOLAX/MIRALAX) powder 255 grams one bottle for colonoscopy prep 255 g 0  . VIMOVO 500-20 MG TBEC Take 1 tablet by mouth daily.    . predniSONE (DELTASONE) 50 MG tablet Take 1 tablet (50 mg total) by mouth daily. (Patient not taking: Reported on 02/03/2017) 3 tablet 0   No facility-administered medications prior to visit.      Allergies: Patient has no known allergies.    PHYSICAL EXAM: VS:  BP 116/64 (BP  Location: Right Arm, Patient Position: Sitting, Cuff Size: Normal)   Pulse 72   Ht 5\' 6"  (1.676 m)   Wt 158 lb 12 oz (72 kg)   BMI 25.62 kg/m  , Body mass index is 25.62 kg/m. Wt Readings from Last 3 Encounters:  02/03/17 158 lb 12 oz (72 kg)  06/18/15 168 lb (76.2 kg)  09/18/14 165 lb (74.8 kg)    GENERAL:  well developed, well nourished, not in acute distress HEENT: normocephalic, pink conjunctivae, anicteric sclerae, no xanthelasma, normal dentition, oropharynx clear NECK:  no neck vein engorgement, JVP normal, no hepatojugular reflux, carotid upstroke brisk and symmetric, no bruit, no thyromegaly, no lymphadenopathy LUNGS:  good respiratory effort,  clear to auscultation bilaterally CV:  PMI not displaced, no thrills, no lifts, S1 and S2 within normal limits, no palpable S3 or S4, 3/6 HSM, no rubs, no gallops ABD:  Soft, nontender, nondistended, normoactive bowel sounds, no abdominal aortic bruit, no hepatomegaly, no splenomegaly MS: nontender back, no kyphosis, no scoliosis, no joint deformities EXT:  1+ DP/PT pulses, no edema, no varicosities, no cyanosis, no clubbing SKIN: warm, nondiaphoretic, normal turgor, no ulcers NEUROPSYCH: alert, oriented to person, place, and time, sensory/motor grossly intact, normal mood, appropriate affect  Recent Labs: No results found for requested labs within last 8760 hours.   Lipid Panel No results found for: CHOL, TRIG, HDL, CHOLHDL, VLDL, LDLCALC, LDLDIRECT   Other studies Reviewed:  EKG:  The ekg from 02/03/2017 was personally reviewed by me and it revealed possible ectopic atrial rhythm vs acceleratd junctional with retrograde P wave. Diffuse T-wave inversions in the inferior and anterolateral leads. Abnormal EKG. No available old EKG for comparison.  Additional studies/ records that were reviewed personally reviewed by me today include: None available   ASSESSMENT AND PLAN: Chest pain, shortness of breath with exertion Abnormal EKG Risk factors for CAD include advanced age, hypertension, diabetes Possible ACS: Possible NSTEMI versus unstable angina I had a lengthy and detailed discussion with the patient regarding possible diagnosis, diagnostic approach. Recommended further evaluation in patient. We could either do a rule out in the stress test or proceed with a heart catheterization as her presentation is consistent with at least unstable angina. Risks and benefits of cardiac catheterization have been discussed with the patient.  These include less than 1% chance of major complications including but not limited to bleeding, infection, kidney damage, arrhyhthmia, heart attack, stroke, and  death.  The patient understands these risks and is willing to proceed. I discussed the case with Dr. Saunders Revel. We will get the patient ready by doing stat labs and stat chest x-ray. Patient has been nothing by mouth since last night. Last meal was dinner.  Abnormal EKG, Systolic murmur Patient will need an echocardiogram.  HTN BP is well controlled. Continue monitoring BP. Continue current medical therapy and lifestyle changes.    Current medicines are reviewed at length with the patient today.  The patient does not have concerns regarding medicines.  Labs/ tests ordered today include:  Orders Placed This Encounter  Procedures  . DG Chest 2 View  . CBC with Differential/Platelet  . Basic Metabolic Panel (BMET)  . INR/PT  . EKG 12-Lead    I had a lengthy and detailed discussion with the patient regarding diagnoses, prognosis, diagnostic options, treatment options , and side effects of medications.   Disposition:   FU with undersigned after tests  Thank you for this consultation. We will forwarding this consultation to referring physician.  I spent at least 60 minutes with the patient today and more than 50% of the time was spent counseling the patient and coordinating care.    Signed, Wende Bushy, MD  02/03/2017 9:50 AM    [place]  This note was generated in part with voice recognition software and I apologize for any typographical errors that were not detected and corrected.

## 2017-02-03 NOTE — Progress Notes (Signed)
ANTICOAGULATION CONSULT NOTE - Initial Consult  Pharmacy Consult for Heparin Indication: 3vCAD  No Known Allergies  Patient Measurements:    Wt: 71.7 kg IBW: 59.3 kg Heparin Dosing Weight: 71.7 kg  Vital Signs: Temp: 98.1 F (36.7 C) (02/13 1620) Temp Source: Oral (02/13 1620) BP: 147/69 (02/13 1620) Pulse Rate: 86 (02/13 1620)  Labs:  Recent Labs  02/03/17 1118  HGB 11.4*  HCT 36.8  PLT 219  LABPROT 14.4  INR 1.11  CREATININE 1.27*    Estimated Creatinine Clearance: 36.5 mL/min (by C-G formula based on SCr of 1.27 mg/dL (H)).   Medical History: Past Medical History:  Diagnosis Date  . [name]    a. LHC 02/03/17 for unstable angina showed severe 3v CAD w/ sequential 70% & 80% p-mLAD, 99% mLCx, CTO mRCA, elevated LV filling pressure  . Colon polyp 2010  . Diabetes mellitus with complication ([place])   . Glaucoma   . Hypertension     Assessment: 28 YOF with recent CP and SOB concerning for NSTEMI vs [place]. The patient underwent a cardiac cath on 2/13 which showed 3vCAD. Plans are to consult CVTS for possible CABG. Pharmacy consulted to resume Heparin ~2 hours after TR band removed. TR band noted to be removed around 1430.   Goal of Therapy:  Heparin level 0.3-0.7 units/ml Monitor platelets by anticoagulation protocol: Yes   Plan:  1. Start Heparin at 850 units/hr 2. Daily heparin levels, CBC 3. Will continue to monitor for any signs/symptoms of bleeding and will follow up with heparin level in 8 hours   Thank you for allowing pharmacy to be a part of this patient's care.  Alycia Rossetti, PharmD, BCPS Clinical Pharmacist Pager: [phone] 02/03/2017 5:10 PM

## 2017-02-03 NOTE — Patient Instructions (Signed)
Labwork: Labs and Chest Xray  Testing/Procedures: Left heart catheterization  Follow-Up: Your physician recommends that you schedule a follow-up appointment in: 2 weeks with Dr. Yvone Neu  It was a pleasure seeing you today here in the office. Please do not hesitate to give [place] a call back if you have any further questions. [place], BSN

## 2017-02-03 NOTE — Progress Notes (Signed)
CRITICAL VALUE ALERT  Critical value received: Troponin 6.48  Date of notification:  02/03/17  Time of notification:  1842  Critical value read back:yes  Nurse who received alert:  Annamaria Boots  PA notified (1st page):  Ellen Henri  Time of first page:  [phone]  Responding MD:  Lyla Glassing  Time MD responded:  [phone]  Value was from [facility] prior to pt's cath. Pt is chest pain free and has been since admission to [facility]. Primary team aware. Instructed to call if pt begins having chest pain.   Fritz Pickerel, RN

## 2017-02-03 NOTE — Interval H&P Note (Signed)
History and Physical Interval Note:  02/03/2017 11:22 AM  [name]  has presented today for cardiac catheterization, with the diagnosis of unstable angina. The various methods of treatment have been discussed with the patient and family. After consideration of risks, benefits and other options for treatment, the patient has consented to  Procedure(s): Left Heart Cath and Coronary Angiography (Left) as a surgical intervention .  The patient's history has been reviewed, patient examined, no change in status, stable for surgery.  I have reviewed the patient's chart and labs.  Questions were answered to the patient's satisfaction.    Cath Lab Visit (complete for each Cath Lab visit)  Clinical Evaluation Leading to the Procedure:   ACS: Yes.    Non-ACS:    Anginal Classification: CCS III  Anti-ischemic medical therapy: No Therapy  Non-Invasive Test Results: No non-invasive testing performed  Prior CABG: No previous CABG  [name] [name]

## 2017-02-03 NOTE — Progress Notes (Signed)
Cardiology Office Note   Date:  02/03/2017   ID:  [name] [name], DOB 1935/04/14, MRN [id number]  Referring Doctor:  Marden Noble, MD   Cardiologist:   Wende Bushy, MD   Reason for consultation:  Chief Complaint  Patient presents with  . other    New patient. referral from Dr Jerene Dilling office Patient states Over the weekend pt has been having CP when lifting or pushing. Meds reviewed verbally with patient.       History of Present Illness: [name] [name] is a 82 y.o. female who presents for Chest pain. Symptoms started 3 days prior to office visit. 01/31/2017. She noted sudden onset of chest pain. She describes it as a sharp pain and a tightness across the center of her chest, 5 out of 10 severity, lasting a few seconds to minutes at a time. This radiated up to the throat. This occurred with physical exertion, mopping the floor, or pushing husband's wheelchair. There was associated significant shortness of breath with the chest pain. Eventually, the symptoms went away with rest but recurred with any physical exertion. This happened again Sunday and then Monday. 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 Since this started happening, she has felt overall weak and tired.  She did call her PCP 02/02/2017 and was advised consultation with cardiology today.  She has been taking aspirin 81 mg by mouth daily. No PND, orthopnea, edema. No palpitations. No history of loss of consciousness.   ROS:  Please see the history of present illness. Aside from mentioned under HPI, all other systems are reviewed and negative.     Past Medical History:  Diagnosis Date  . Colon polyp 2010  . Diabetes mellitus without complication (HCC)   . Glaucoma   . Hypertension     Past Surgical History:  Procedure Laterality Date  . APPENDECTOMY    . BREAST BIOPSY Right 1999  . COLONOSCOPY  2010  . PARATHYROIDECTOMY     19 86     reports that she has  never smoked. She has never used smokeless tobacco. She reports that she does not drink alcohol or use drugs.   family history includes Diabetes in her father and sister.   Outpatient Medications Prior to Visit  Medication Sig Dispense Refill  . acetaminophen (TYLENOL) 500 MG tablet Take 500 mg by mouth every 6 (six) hours as needed.    . Calcium Carbonate-Vitamin D (CALCIUM + D PO) Take 1 tablet by mouth daily.    Marland Kitchen latanoprost (XALATAN) 0.005 % ophthalmic solution 1 drop at bedtime.    . naproxen (NAPROSYN) 375 MG tablet Take 1 tablet (375 mg total) by mouth 2 (two) times daily. (Patient not taking: Reported on 02/03/2017) 20 tablet 0  . olmesartan-hydrochlorothiazide (BENICAR HCT) 40-12.5 MG per tablet Take 1 tablet by mouth daily.    . ONGLYZA 5 MG TABS tablet Take 1 tablet by mouth daily.    . polyethylene glycol powder (GLYCOLAX/MIRALAX) powder 255 grams one bottle for colonoscopy prep 255 g 0  . VIMOVO 500-20 MG TBEC Take 1 tablet by mouth daily.    . predniSONE (DELTASONE) 50 MG tablet Take 1 tablet (50 mg total) by mouth daily. (Patient not taking: Reported on 02/03/2017) 3 tablet 0   No facility-administered medications prior to visit.      Allergies: Patient has no known allergies.    PHYSICAL EXAM: VS:  BP 116/64 (BP  Location: Right Arm, Patient Position: Sitting, Cuff Size: Normal)   Pulse 72   Ht 5\' 6"  (1.676 m)   Wt 158 lb 12 oz (72 kg)   BMI 25.62 kg/m  , Body mass index is 25.62 kg/m. Wt Readings from Last 3 Encounters:  02/03/17 158 lb 12 oz (72 kg)  06/18/15 168 lb (76.2 kg)  09/18/14 165 lb (74.8 kg)    GENERAL:  well developed, well nourished, not in acute distress HEENT: normocephalic, pink conjunctivae, anicteric sclerae, no xanthelasma, normal dentition, oropharynx clear NECK:  no neck vein engorgement, JVP normal, no hepatojugular reflux, carotid upstroke brisk and symmetric, no bruit, no thyromegaly, no lymphadenopathy LUNGS:  good respiratory effort,  clear to auscultation bilaterally CV:  PMI not displaced, no thrills, no lifts, S1 and S2 within normal limits, no palpable S3 or S4, 3/6 HSM, no rubs, no gallops ABD:  Soft, nontender, nondistended, normoactive bowel sounds, no abdominal aortic bruit, no hepatomegaly, no splenomegaly MS: nontender back, no kyphosis, no scoliosis, no joint deformities EXT:  1+ DP/PT pulses, no edema, no varicosities, no cyanosis, no clubbing SKIN: warm, nondiaphoretic, normal turgor, no ulcers NEUROPSYCH: alert, oriented to person, place, and time, sensory/motor grossly intact, normal mood, appropriate affect  Recent Labs: No results found for requested labs within last 8760 hours.   Lipid Panel No results found for: CHOL, TRIG, HDL, CHOLHDL, VLDL, LDLCALC, LDLDIRECT   Other studies Reviewed:  EKG:  The ekg from 02/03/2017 was personally reviewed by me and it revealed possible ectopic atrial rhythm vs acceleratd junctional with retrograde P wave. Diffuse T-wave inversions in the inferior and anterolateral leads. Abnormal EKG. No available old EKG for comparison.  Additional studies/ records that were reviewed personally reviewed by me today include: None available   ASSESSMENT AND PLAN: Chest pain, shortness of breath with exertion Abnormal EKG Risk factors for CAD include advanced age, hypertension, diabetes Possible ACS: Possible NSTEMI versus unstable angina I had a lengthy and detailed discussion with the patient regarding possible diagnosis, diagnostic approach. Recommended further evaluation in patient. We could either do a rule out in the stress test or proceed with a heart catheterization as her presentation is consistent with at least unstable angina. Risks and benefits of cardiac catheterization have been discussed with the patient.  These include less than 1% chance of major complications including but not limited to bleeding, infection, kidney damage, arrhyhthmia, heart attack, stroke, and  death.  The patient understands these risks and is willing to proceed. I discussed the case with Dr. Saunders Revel. We will get the patient ready by doing stat labs and stat chest x-ray. Patient has been nothing by mouth since last night. Last meal was dinner.  Abnormal EKG, Systolic murmur Patient will need an echocardiogram.  HTN BP is well controlled. Continue monitoring BP. Continue current medical therapy and lifestyle changes.    Current medicines are reviewed at length with the patient today.  The patient does not have concerns regarding medicines.  Labs/ tests ordered today include:  Orders Placed This Encounter  Procedures  . DG Chest 2 View  . CBC with Differential/Platelet  . Basic Metabolic Panel (BMET)  . INR/PT  . EKG 12-Lead    I had a lengthy and detailed discussion with the patient regarding diagnoses, prognosis, diagnostic options, treatment options , and side effects of medications.   Disposition:   FU with undersigned after tests  Thank you for this consultation. We will forwarding this consultation to referring physician.  I spent at least 60 minutes with the patient today and more than 50% of the time was spent counseling the patient and coordinating care.    Signed, Wende Bushy, MD  02/03/2017 9:50 AM    [place]  This note was generated in part with voice recognition software and I apologize for any typographical errors that were not detected and corrected.

## 2017-02-03 NOTE — Progress Notes (Addendum)
Pt oriented to room and equipment. VSS. Telemetry applied, CCMD notified. Call bell within reach, will continue to monitor.   Paged attending team for orders  Fritz Pickerel, RN

## 2017-02-03 NOTE — H&P (Signed)
History and Physical  Patient ID: [name] [name] MRN: [id number], DOB: Dec 23, 1934 Admit Date: (Not on file) Date of Encounter: 02/03/2017, 1:43 PM Primary Physician: Marden Noble, MD Primary Cardiologist: Dr. Yvone Neu, MD  Chief Complaint: Chest pain Reason for Admission: Unstable angina with 3-vessel CAD noted on [facility] 02/03/2017  HPI: 82 y.o. female with h/o recently diagnosed 3-vessel CAD as below by cardiac cath on 02/03/17 in the setting of unstable angina, DM2, HTN, and glaucoma who was seen by Dr. Yvone Neu this morning as a new patient for evaluation of chest pain, was found to have unstable angina with EKG changes and underwent emergent cardiac cath that showed 3-vessel CAD and has been transferred to [facility] for evaluation of possible CABG.  Prior to her office visit on 02/03/17 she did not have any previously known cardiac history. She reported a 3 day history of sudden onset chest pain that began on 2/10. Pain has been described as sharp with a tightness across the center of her chest, rate a 5/10 in severity. Pain will last for a few seconds to minutes with self resolution. There is radiation into her throat. Pain has been associated with physical exertiona such as mopping the floor or pushing her husband's wheelchair. There is associated SOB. Pain improves with rest. Since her symptoms began on 2/10 she has overall felt fatigued and weak. She has been taking ASA 81 mg daily at home. During her office visit with Dr. Yvone Neu today in the [place] office she was still having symptoms. EKG showed possible ectopic atrial rhythm vs accelerated junctional with retrograde P wave. There were diffuse TWI in the inferior and anterolateral leads. No prior to compare to. Case was discussed with Dr. Saunders Revel. She was given ASA 81 mg pre cath and underwent stat labs and CXR followed by [place] on 02/03/17 that showed diffuse 3-vessel CAD including sequential 70% and 80% proximal and mid LAD stenoses, 99% mid  LCx lesion with TIMI-2 flow, and CTO of the mid RCA. Mildly elevated LV filling pressure. Recommendation to transfer to [facility] for evaluation of possible CABG. It was recommended for further cardiac catheterizations an alternate approach be used given severe right subclavian artery tortuosity. Currently without angina.    Past Medical History:  Diagnosis Date  . [name]    a. LHC 02/03/17 for unstable angina showed severe 3v CAD w/ sequential 70% & 80% p-mLAD, 99% mLCx, CTO mRCA, elevated LV filling pressure  . Colon polyp 2010  . Diabetes mellitus without complication ([place])   . Glaucoma   . Hypertension      Most Recent Cardiac Studies: LHC 02/03/17: Coronary Findings   Dominance: Right  Left Main  Vessel is large.  LM lesion, 15% stenosed.  Left Anterior Descending  Vessel is large.  Prox LAD lesion, 70% stenosed. The lesion is discrete.  Mid LAD lesion, 80% stenosed. The lesion is discrete and eccentric.  First Diagonal Branch  Vessel is moderate in size.  1st Diag lesion, 50% stenosed.  Second Diagonal Branch  Vessel is small in size.  Third Diagonal Branch  Vessel is small in size.  Left Circumflex  Mid Cx lesion, 99% stenosed.  First Obtuse Marginal Branch  Vessel is small in size. There is TIMI-2 flow into distal vessel.  Second Obtuse Marginal Branch  Vessel is moderate in size.  Third Obtuse Marginal Branch  Vessel is small in size.  Right Coronary Artery  Vessel is moderate in size. The  RCA has a high, anterior take-off.  Prox RCA lesion, 80% stenosed.  Mid RCA lesion, 99% stenosed.  Right Posterior Descending Artery  Vessel is moderate in size. RPDA filled by collaterals from 3rd Sept.  Right Posterior Atrioventricular Branch  Vessel is moderate in size.  Left Heart   Left Ventricle LV end diastolic pressure is mildly elevated. LVEDP 15-20 mmHg.    Coronary Diagrams   Diagnostic Diagram        Surgical History:  Past Surgical History:    Procedure Laterality Date  . APPENDECTOMY    . BREAST BIOPSY Right 1999  . COLONOSCOPY  2010  . PARATHYROIDECTOMY     1986     Home Meds: Prior to Admission medications   Medication Sig Start Date End Date Taking? Authorizing Provider  acetaminophen (TYLENOL) 500 MG tablet Take 500 mg by mouth every 6 (six) hours as needed.    Historical Provider, MD  Calcium Carbonate-Vitamin D (CALCIUM + D PO) Take 1 tablet by mouth daily.    Historical Provider, MD  latanoprost (XALATAN) 0.005 % ophthalmic solution 1 drop at bedtime.    Historical Provider, MD  naproxen (NAPROSYN) 375 MG tablet Take 1 tablet (375 mg total) by mouth 2 (two) times daily. Patient not taking: Reported on 02/03/2017 06/18/15   Sherlene Shams, MD  olmesartan-hydrochlorothiazide (BENICAR HCT) 40-12.5 MG per tablet Take 1 tablet by mouth daily.    Historical Provider, MD  ONGLYZA 5 MG TABS tablet Take 1 tablet by mouth daily. 08/06/14   Historical Provider, MD  polyethylene glycol powder (GLYCOLAX/MIRALAX) powder 255 grams one bottle for colonoscopy prep 09/18/14   Robert Bellow, MD  VIMOVO 500-20 MG TBEC Take 1 tablet by mouth daily. 07/14/14   Historical Provider, MD    Allergies: No Known Allergies  Social History   Social History  . Marital status: Married    Spouse name: N/A  . Number of children: N/A  . Years of education: N/A   Occupational History  . Not on file.   Social History Main Topics  . Smoking status: Never Smoker  . Smokeless tobacco: Never Used  . Alcohol use No  . Drug use: No  . Sexual activity: Not on file   Other Topics Concern  . Not on file   Social History Narrative  . No narrative on file     Family History  Problem Relation Age of Onset  . Diabetes Father   . Diabetes Sister     Review of Systems: Review of Systems  Constitutional: Positive for malaise/fatigue. Negative for chills, diaphoresis, fever and weight loss.  HENT: Negative.   Eyes: Negative.   Respiratory:  Positive for shortness of breath. Negative for cough, hemoptysis, sputum production and wheezing.   Cardiovascular: Positive for chest pain. Negative for palpitations, orthopnea, claudication, leg swelling and PND.  Gastrointestinal: Negative for abdominal pain, blood in stool, heartburn, melena, nausea and vomiting.  Genitourinary: Negative.   Musculoskeletal: Negative for falls and myalgias.  Skin: Negative.   Neurological: Positive for weakness. Negative for dizziness, tingling, tremors, sensory change, speech change, focal weakness and loss of consciousness.  Endo/Heme/Allergies: Negative.   Psychiatric/Behavioral: Negative for substance abuse. The patient is not nervous/anxious.   All other systems reviewed and are negative.   Labs:   Lab Results  Component Value Date   WBC 7.8 02/03/2017   HGB 11.4 (L) 02/03/2017   HCT 36.8 02/03/2017   MCV 72.6 (L) 02/03/2017   PLT 219 02/03/2017  Recent Labs Lab 02/03/17 1118  NA 142  K 3.6  CL 108  CO2 26  BUN 29*  CREATININE 1.27*  CALCIUM 9.5  GLUCOSE 150*    Radiology/Studies:  Dg Chest 2 View  Result Date: 02/03/2017 IMPRESSION: Cardiomegaly with mild pulmonary interstitial prominence suggests low-grade CHF. Alternatively chronic bronchitic-fibrotic changes could produce similar appearance of the pulmonary interstitium. There is no acute pneumonia nor pleural effusion. Thoracic aortic atherosclerosis. Electronically Signed   By: David  Martinique M.D.   On: 02/03/2017 10:56     EKG: Interpreted by me showed: Possible ectopic atrial rhythm vs accelerated junctional with retrograde P wave. There were diffuse TWI in the inferior and anterolateral leads. No prior to compare to.  Weights: Wt Readings from Last 3 Encounters:  02/03/17 158 lb 12 oz (72 kg)  06/18/15 168 lb (76.2 kg)  09/18/14 165 lb (74.8 kg)    Physical Exam: VS:  BP 116/64 (BP Location: Right Arm, Patient Position: Sitting, Cuff Size: Normal)   Pulse 72   Ht  5\' 6"  (1.676 m)   Wt 158 lb 12 oz (72 kg)   BMI 25.62 kg/m  , Body mass index is 25.62 kg/m. General: Well developed, well nourished, in no acute distress. Head: Normocephalic, atraumatic, sclera non-icteric, no xanthomas, nares are without discharge.  Neck: Negative for carotid bruits. JVD not elevated. Lungs: Clear bilaterally to auscultation without wheezes, rales, or rhonchi. Breathing is unlabored. Heart: RRR with S1 S2.III/VI HSM, no rubs, or gallops appreciated. Abdomen: Soft, non-tender, non-distended with normoactive bowel sounds. No hepatomegaly. No rebound/guarding. No obvious abdominal masses. Msk:  Strength and tone appear normal for age. Extremities: No clubbing or cyanosis. No edema. Distal pedal pulses are 1+ and equal bilaterally. Neuro: Alert and oriented X 3. No focal deficit. No facial asymmetry. Moves all extremities spontaneously. Psych:  Responds to questions appropriately with a normal affect.    ASSESSMENT AND PLAN:  Principal Problem:   Unstable angina (HCC) Active Problems:   [name]   Murmur   AKI (acute kidney injury) ([place])   Type 2 diabetes mellitus with complication (HCC)   Essential hypertension   1. 3-vessel CAD/unstable angina: -By cardiac cath 02/03/17 as above -Admit to [name] for evaluation of possible CABG, Trish to get in touch with [facility] -Start heparin gtt 2 hours after TR band removal per MD (approximately 5 PM on 02/03/17) -Check echo as no LV gram was performed on cardiac catheterization -Check fasting lipid panel and A1C for further risk stratification -Continue ASA 81 mg daily -Add Coreg 3.125 mg bid -Add Lipitor 80 mg daily  2. Murmur: -Check echo as above  3. DM/hyperglycemia: -A1C as above -Continue home medications  4. HTN: -Well controlled  -Continue home medications  5. AKI: -Baseline unknown -IV fluids post cardiac cath -Monitor  6. Glaucoma: -Continue home Xalatan  7. OA: -Hold Vimovo   8.  Subclinical hypokalemia: -Replete to 4.0 -Check magnesium and replete to 2.0 as indicated   9. Status post parathyroidectomy: -Check TSH -Not on thyroid replacement therapy as an outpatient   Signed, Marcille Blanco [place] Pager: [phone] 02/03/2017, 1:43 PM

## 2017-02-04 ENCOUNTER — Inpatient Hospital Stay (HOSPITAL_COMMUNITY): Payer: Medicare Other | Admitting: Anesthesiology

## 2017-02-04 ENCOUNTER — Encounter (HOSPITAL_COMMUNITY): Payer: Medicare Other

## 2017-02-04 ENCOUNTER — Inpatient Hospital Stay (HOSPITAL_COMMUNITY): Payer: Medicare Other

## 2017-02-04 ENCOUNTER — Encounter (HOSPITAL_COMMUNITY): Payer: Self-pay | Admitting: Anesthesiology

## 2017-02-04 ENCOUNTER — Other Ambulatory Visit: Payer: Self-pay | Admitting: *Deleted

## 2017-02-04 ENCOUNTER — Encounter (HOSPITAL_COMMUNITY)
Admission: AD | Disposition: A | Discharge status: Skilled Nursing Facility | Payer: Self-pay | Source: Other Acute Inpatient Hospital | Attending: Surgery

## 2017-02-04 DIAGNOSIS — R011 Cardiac murmur, unspecified: Secondary | ICD-10-CM

## 2017-02-04 DIAGNOSIS — Z01810 Encounter for preprocedural cardiovascular examination: Secondary | ICD-10-CM

## 2017-02-04 DIAGNOSIS — I2510 Atherosclerotic heart disease of native coronary artery without angina pectoris: Secondary | ICD-10-CM

## 2017-02-04 DIAGNOSIS — N179 Acute kidney failure, unspecified: Secondary | ICD-10-CM

## 2017-02-04 DIAGNOSIS — I25110 Atherosclerotic heart disease of native coronary artery with unstable angina pectoris: Secondary | ICD-10-CM

## 2017-02-04 DIAGNOSIS — I340 Nonrheumatic mitral (valve) insufficiency: Secondary | ICD-10-CM

## 2017-02-04 DIAGNOSIS — I200 Unstable angina: Secondary | ICD-10-CM

## 2017-02-04 DIAGNOSIS — R570 Cardiogenic shock: Secondary | ICD-10-CM

## 2017-02-04 HISTORY — PX: IABP INSERTION: CATH118242

## 2017-02-04 HISTORY — PX: RIGHT HEART CATH: CATH118263

## 2017-02-04 LAB — COMPREHENSIVE METABOLIC PANEL
ALT: 24 U/L (ref 14–54)
AST: 72 U/L — AB (ref 15–41)
Albumin: 3.5 g/dL (ref 3.5–5.0)
Alkaline Phosphatase: 63 U/L (ref 38–126)
Anion gap: 11 (ref 5–15)
BILIRUBIN TOTAL: 1.2 mg/dL (ref 0.3–1.2)
BUN: 22 mg/dL — AB (ref 6–20)
CHLORIDE: 106 mmol/L (ref 101–111)
CO2: 24 mmol/L (ref 22–32)
CREATININE: 1.15 mg/dL — AB (ref 0.44–1.00)
Calcium: 9.2 mg/dL (ref 8.9–10.3)
GFR calc non Af Amer: 44 mL/min — ABNORMAL LOW (ref >60)
GFR, EST AFRICAN AMERICAN: 51 mL/min — AB (ref >60)
Glucose, Bld: 141 mg/dL — ABNORMAL HIGH (ref 65–99)
Potassium: 3.6 mmol/L (ref 3.5–5.1)
Sodium: 141 mmol/L (ref 135–145)
Total Protein: 5.8 g/dL — ABNORMAL LOW (ref 6.5–8.1)

## 2017-02-04 LAB — GLUCOSE, CAPILLARY
GLUCOSE-CAPILLARY: 135 mg/dL — AB (ref 65–99)
Glucose-Capillary: 139 mg/dL — ABNORMAL HIGH (ref 65–99)
Glucose-Capillary: 224 mg/dL — ABNORMAL HIGH (ref 65–99)
Glucose-Capillary: 236 mg/dL — ABNORMAL HIGH (ref 65–99)

## 2017-02-04 LAB — PULMONARY FUNCTION TEST
DL/VA % pred: 105 %
DL/VA: 5.42 ml/min/mmHg/L
DLCO COR % PRED: 46 %
DLCO COR: 13.09 ml/min/mmHg
DLCO UNC % PRED: 40 %
DLCO UNC: 11.59 ml/min/mmHg
FEF 25-75 POST: 0.76 L/s
FEF 25-75 PRE: 1.42 L/s
FEF2575-%Change-Post: -46 %
FEF2575-%PRED-PRE: 91 %
FEF2575-%Pred-Post: 49 %
FEV1-%Change-Post: -11 %
FEV1-%PRED-POST: 71 %
FEV1-%PRED-PRE: 79 %
FEV1-POST: 1.31 L
FEV1-Pre: 1.48 L
FEV1FVC-%CHANGE-POST: 0 %
FEV1FVC-%Pred-Pre: 107 %
FEV6-%CHANGE-POST: -10 %
FEV6-%PRED-POST: 71 %
FEV6-%PRED-PRE: 80 %
FEV6-Post: 1.64 L
FEV6-Pre: 1.83 L
FEV6FVC-%PRED-POST: 104 %
FEV6FVC-%Pred-Pre: 104 %
FVC-%Change-Post: -10 %
FVC-%Pred-Post: 68 %
FVC-%Pred-Pre: 76 %
FVC-Post: 1.64 L
FVC-Pre: 1.83 L
POST FEV6/FVC RATIO: 100 %
PRE FEV1/FVC RATIO: 81 %
Post FEV1/FVC ratio: 80 %
Pre FEV6/FVC Ratio: 100 %
RV % pred: 74 %
RV: 1.87 L
TLC % PRED: 60 %
TLC: 3.33 L

## 2017-02-04 LAB — COOXEMETRY PANEL
Carboxyhemoglobin: 1.1 % (ref 0.5–1.5)
Methemoglobin: 0.9 % (ref 0.0–1.5)
O2 SAT: 39.4 %
TOTAL HEMOGLOBIN: 10.3 g/dL — AB (ref 12.0–16.0)

## 2017-02-04 LAB — CBC WITH DIFFERENTIAL/PLATELET
BASOS ABS: 0 10*3/uL (ref 0.0–0.1)
BASOS PCT: 0 %
EOS ABS: 0.3 10*3/uL (ref 0.0–0.7)
Eosinophils Relative: 3 %
HCT: 33.7 % — ABNORMAL LOW (ref 36.0–46.0)
HEMOGLOBIN: 10.2 g/dL — AB (ref 12.0–15.0)
LYMPHS ABS: 2.9 10*3/uL (ref 0.7–4.0)
LYMPHS PCT: 34 %
MCH: 22.5 pg — ABNORMAL LOW (ref 26.0–34.0)
MCHC: 30.3 g/dL (ref 30.0–36.0)
MCV: 74.4 fL — ABNORMAL LOW (ref 78.0–100.0)
Monocytes Absolute: 0.9 10*3/uL (ref 0.1–1.0)
Monocytes Relative: 10 %
NEUTROS ABS: 4.5 10*3/uL (ref 1.7–7.7)
Neutrophils Relative %: 53 %
Platelets: 192 10*3/uL (ref 150–400)
RBC: 4.53 MIL/uL (ref 3.87–5.11)
RDW: 19.1 % — ABNORMAL HIGH (ref 11.5–15.5)
WBC: 8.6 10*3/uL (ref 4.0–10.5)

## 2017-02-04 LAB — POCT I-STAT 3, VENOUS BLOOD GAS (G3P V)
Acid-base deficit: 10 mmol/L — ABNORMAL HIGH (ref 0.0–2.0)
Acid-base deficit: 9 mmol/L — ABNORMAL HIGH (ref 0.0–2.0)
Bicarbonate: 14.7 mmol/L — ABNORMAL LOW (ref 20.0–28.0)
Bicarbonate: 17.1 mmol/L — ABNORMAL LOW (ref 20.0–28.0)
O2 Saturation: 100 %
O2 Saturation: 51 %
TCO2: 15 mmol/L (ref 0–100)
TCO2: 18 mmol/L (ref 0–100)
pCO2, Ven: 27.7 mmHg — ABNORMAL LOW (ref 44.0–60.0)
pCO2, Ven: 35 mmHg — ABNORMAL LOW (ref 44.0–60.0)
pH, Ven: 7.297 (ref 7.250–7.430)
pH, Ven: 7.332 (ref 7.250–7.430)
pO2, Ven: 174 mmHg — ABNORMAL HIGH (ref 32.0–45.0)
pO2, Ven: 30 mmHg — CL (ref 32.0–45.0)

## 2017-02-04 LAB — POCT I-STAT 3, ART BLOOD GAS (G3+)
Acid-base deficit: 7 mmol/L — ABNORMAL HIGH (ref 0.0–2.0)
BICARBONATE: 16.4 mmol/L — AB (ref 20.0–28.0)
O2 SAT: 93 %
PCO2 ART: 23.6 mmHg — AB (ref 32.0–48.0)
Patient temperature: 35.3
TCO2: 17 mmol/L (ref 0–100)
pH, Arterial: 7.443 (ref 7.350–7.450)
pO2, Arterial: 58 mmHg — ABNORMAL LOW (ref 83.0–108.0)

## 2017-02-04 LAB — HEPARIN LEVEL (UNFRACTIONATED)
HEPARIN UNFRACTIONATED: 0.21 [IU]/mL — AB (ref 0.30–0.70)
Heparin Unfractionated: 0.32 IU/mL (ref 0.30–0.70)

## 2017-02-04 LAB — HEMOGLOBIN A1C
HEMOGLOBIN A1C: 6.3 % — AB (ref 4.8–5.6)
MEAN PLASMA GLUCOSE: 134 mg/dL

## 2017-02-04 LAB — ECHOCARDIOGRAM COMPLETE
HEIGHTINCHES: 66 in
Weight: 2528 oz

## 2017-02-04 LAB — LIPID PANEL
Cholesterol: 156 mg/dL (ref 0–200)
HDL: 42 mg/dL (ref >40)
LDL Cholesterol: 93 mg/dL (ref 0–99)
TRIGLYCERIDES: 104 mg/dL (ref <150)
Total CHOL/HDL Ratio: 3.7 RATIO
VLDL: 21 mg/dL (ref 0–40)

## 2017-02-04 LAB — ABO/RH: ABO/RH(D): O POS

## 2017-02-04 SURGERY — IABP INSERTION
Anesthesia: LOCAL

## 2017-02-04 MED ORDER — SODIUM CHLORIDE 0.9 % IV SOLN
INTRAVENOUS | Status: DC
  Filled 2017-02-04 (×2): qty 2.5

## 2017-02-04 MED ORDER — DEXTROSE 5 % IV SOLN
0.0000 ug/min | INTRAVENOUS | Status: DC
  Administered 2017-02-05: 30 ug/min via INTRAVENOUS
  Filled 2017-02-04 (×4): qty 16

## 2017-02-04 MED ORDER — SODIUM CHLORIDE 0.9 % IV SOLN
INTRAVENOUS | Status: DC
  Administered 2017-02-04 – 2017-02-05 (×2): via INTRAVENOUS

## 2017-02-04 MED ORDER — HEPARIN (PORCINE) IN NACL 100-0.45 UNIT/ML-% IJ SOLN
800.0000 [IU]/h | INTRAMUSCULAR | Status: DC
  Administered 2017-02-04: 1000 [IU]/h via INTRAVENOUS
  Administered 2017-02-04: 800 [IU]/h via INTRAVENOUS
  Filled 2017-02-04: qty 250

## 2017-02-04 MED ORDER — SODIUM BICARBONATE 8.4 % IV SOLN
INTRAVENOUS | Status: DC | PRN
  Administered 2017-02-04: 100 meq via INTRAVENOUS

## 2017-02-04 MED ORDER — DEXMEDETOMIDINE HCL IN NACL 400 MCG/100ML IV SOLN
0.1000 ug/kg/h | INTRAVENOUS | Status: DC
  Filled 2017-02-04 (×2): qty 100

## 2017-02-04 MED ORDER — SODIUM CHLORIDE 0.9 % IV SOLN
INTRAVENOUS | Status: DC | PRN
  Administered 2017-02-04: 100 mL/h via INTRAVENOUS
  Administered 2017-02-04: 75 mL/h via INTRAVENOUS

## 2017-02-04 MED ORDER — ADENOSINE 6 MG/2ML IV SOLN
INTRAVENOUS | Status: AC
  Filled 2017-02-04: qty 2

## 2017-02-04 MED ORDER — HEPARIN (PORCINE) IN NACL 2-0.9 UNIT/ML-% IJ SOLN
INTRAMUSCULAR | Status: DC | PRN
Start: 2017-02-04 — End: 2017-02-04
  Administered 2017-02-04: 1500 mL

## 2017-02-04 MED ORDER — TRANEXAMIC ACID (OHS) PUMP PRIME SOLUTION
2.0000 mg/kg | INTRAVENOUS | Status: DC
  Filled 2017-02-04 (×2): qty 1.43

## 2017-02-04 MED ORDER — ETOMIDATE 2 MG/ML IV SOLN
INTRAVENOUS | Status: DC | PRN
  Administered 2017-02-04: 10 mg via INTRAVENOUS

## 2017-02-04 MED ORDER — TEMAZEPAM 15 MG PO CAPS: 15.0000 mg | ORAL_CAPSULE | Freq: Once | ORAL | Status: DC | PRN

## 2017-02-04 MED ORDER — CHLORHEXIDINE GLUCONATE 0.12% ORAL RINSE (MEDLINE KIT)
15.0000 mL | Freq: Two times a day (BID) | OROMUCOSAL | Status: DC
  Administered 2017-02-04 – 2017-02-05 (×2): 15 mL via OROMUCOSAL

## 2017-02-04 MED ORDER — NITROGLYCERIN IN D5W 200-5 MCG/ML-% IV SOLN
2.0000 ug/min | INTRAVENOUS | Status: DC
  Filled 2017-02-04: qty 250

## 2017-02-04 MED ORDER — MIDAZOLAM HCL 2 MG/2ML IJ SOLN
INTRAMUSCULAR | Status: DC | PRN
  Administered 2017-02-04 (×3): 2 mg via INTRAVENOUS

## 2017-02-04 MED ORDER — EPINEPHRINE PF 1 MG/ML IJ SOLN
0.0000 ug/min | INTRAVENOUS | Status: DC
  Filled 2017-02-04 (×2): qty 4

## 2017-02-04 MED ORDER — DOPAMINE-DEXTROSE 3.2-5 MG/ML-% IV SOLN
0.0000 ug/kg/min | INTRAVENOUS | Status: DC
  Filled 2017-02-04: qty 250

## 2017-02-04 MED ORDER — SUCCINYLCHOLINE CHLORIDE 20 MG/ML IJ SOLN
INTRAMUSCULAR | Status: DC | PRN
  Administered 2017-02-04: 100 mg via INTRAVENOUS

## 2017-02-04 MED ORDER — ALBUTEROL SULFATE (2.5 MG/3ML) 0.083% IN NEBU
2.5000 mg | INHALATION_SOLUTION | Freq: Once | RESPIRATORY_TRACT | Status: AC
  Administered 2017-02-04: 2.5 mg via RESPIRATORY_TRACT

## 2017-02-04 MED ORDER — BISACODYL 5 MG PO TBEC
5.0000 mg | DELAYED_RELEASE_TABLET | Freq: Once | ORAL | Status: AC
  Administered 2017-02-04: 5 mg via ORAL
  Filled 2017-02-04: qty 1

## 2017-02-04 MED ORDER — SODIUM CHLORIDE 0.9 % IV SOLN
INTRAVENOUS | Status: DC | PRN
  Administered 2017-02-04: 50 mL/h via INTRAVENOUS

## 2017-02-04 MED ORDER — TRANEXAMIC ACID (OHS) BOLUS VIA INFUSION
15.0000 mg/kg | INTRAVENOUS | Status: DC
  Filled 2017-02-04: qty 1076

## 2017-02-04 MED ORDER — SODIUM CHLORIDE 0.9% FLUSH: 10.0000 mL | INTRAVENOUS | Status: DC | PRN

## 2017-02-04 MED ORDER — DOPAMINE-DEXTROSE 3.2-5 MG/ML-% IV SOLN
INTRAVENOUS | Status: DC | PRN
  Administered 2017-02-04: 10 ug/kg/min via INTRAVENOUS

## 2017-02-04 MED ORDER — DOPAMINE-DEXTROSE 3.2-5 MG/ML-% IV SOLN
INTRAVENOUS | Status: AC
  Filled 2017-02-04: qty 250

## 2017-02-04 MED ORDER — VANCOMYCIN HCL 10 G IV SOLR
1250.0000 mg | INTRAVENOUS | Status: DC
  Filled 2017-02-04 (×2): qty 1250

## 2017-02-04 MED ORDER — SODIUM CHLORIDE 0.9% FLUSH: 3.0000 mL | INTRAVENOUS | Status: DC | PRN

## 2017-02-04 MED ORDER — FENTANYL CITRATE (PF) 100 MCG/2ML IJ SOLN
INTRAMUSCULAR | Status: AC
  Filled 2017-02-04: qty 2

## 2017-02-04 MED ORDER — SODIUM CHLORIDE 0.9 % IV BOLUS (SEPSIS)
500.0000 mL | Freq: Once | INTRAVENOUS | Status: AC
  Administered 2017-02-04: 500 mL via INTRAVENOUS

## 2017-02-04 MED ORDER — DEXTROSE 5 % IV SOLN
1.5000 g | INTRAVENOUS | Status: DC
  Filled 2017-02-04 (×2): qty 1.5

## 2017-02-04 MED ORDER — ADENOSINE 6 MG/2ML IV SOLN
INTRAVENOUS | Status: DC | PRN
  Administered 2017-02-04: 6 mg via INTRAVENOUS

## 2017-02-04 MED ORDER — HEPARIN (PORCINE) IN NACL 2-0.9 UNIT/ML-% IJ SOLN
INTRAMUSCULAR | Status: AC
  Filled 2017-02-04: qty 1000

## 2017-02-04 MED ORDER — PANTOPRAZOLE SODIUM 40 MG IV SOLR
40.0000 mg | INTRAVENOUS | Status: DC
  Administered 2017-02-04: 40 mg via INTRAVENOUS
  Filled 2017-02-04: qty 40

## 2017-02-04 MED ORDER — MIDAZOLAM HCL 2 MG/2ML IJ SOLN
INTRAMUSCULAR | Status: AC
  Filled 2017-02-04: qty 2

## 2017-02-04 MED ORDER — SODIUM CHLORIDE 0.9 % IV SOLN
30.0000 ug/min | INTRAVENOUS | Status: DC
  Filled 2017-02-04 (×2): qty 2

## 2017-02-04 MED ORDER — LIDOCAINE HCL (PF) 1 % IJ SOLN
INTRAMUSCULAR | Status: AC
  Filled 2017-02-04: qty 30

## 2017-02-04 MED ORDER — LIDOCAINE HCL (PF) 1 % IJ SOLN
INTRAMUSCULAR | Status: DC | PRN
  Administered 2017-02-04: 20 mL

## 2017-02-04 MED ORDER — POTASSIUM CHLORIDE 2 MEQ/ML IV SOLN
80.0000 meq | INTRAVENOUS | Status: DC
  Filled 2017-02-04 (×2): qty 40

## 2017-02-04 MED ORDER — SODIUM CHLORIDE 0.9 % IV SOLN: 250.0000 mL | INTRAVENOUS | Status: DC | PRN

## 2017-02-04 MED ORDER — SODIUM CHLORIDE 0.9 % IV SOLN
0.5000 mg/h | INTRAVENOUS | Status: DC
  Administered 2017-02-04: 2 mg/h via INTRAVENOUS
  Administered 2017-02-05: 1 mg/h via INTRAVENOUS
  Filled 2017-02-04 (×2): qty 10

## 2017-02-04 MED ORDER — TRANEXAMIC ACID 1000 MG/10ML IV SOLN
1.5000 mg/kg/h | INTRAVENOUS | Status: DC
  Filled 2017-02-04 (×2): qty 25

## 2017-02-04 MED ORDER — PLASMA-LYTE 148 IV SOLN
INTRAVENOUS | Status: DC
  Filled 2017-02-04 (×2): qty 2.5

## 2017-02-04 MED ORDER — SODIUM CHLORIDE 0.9% FLUSH
10.0000 mL | Freq: Two times a day (BID) | INTRAVENOUS | Status: DC
  Administered 2017-02-04: 10 mL

## 2017-02-04 MED ORDER — DEXTROSE 5 % IV SOLN
0.0000 ug/min | INTRAVENOUS | Status: DC
  Administered 2017-02-04: 20 ug/min via INTRAVENOUS
  Filled 2017-02-04 (×2): qty 4

## 2017-02-04 MED ORDER — SODIUM CHLORIDE 0.9 % IV SOLN
10.0000 ug/h | INTRAVENOUS | Status: DC
  Administered 2017-02-04: 25 ug/h via INTRAVENOUS
  Administered 2017-02-05: 50 ug/h via INTRAVENOUS
  Administered 2017-02-05: 300 ug/h via INTRAVENOUS
  Filled 2017-02-04 (×2): qty 50

## 2017-02-04 MED ORDER — ORAL CARE MOUTH RINSE
15.0000 mL | Freq: Four times a day (QID) | OROMUCOSAL | Status: DC
  Administered 2017-02-05 (×2): 15 mL via OROMUCOSAL

## 2017-02-04 MED ORDER — DEXTROSE 5 % IV SOLN
750.0000 mg | INTRAVENOUS | Status: DC
  Filled 2017-02-04 (×2): qty 750

## 2017-02-04 MED ORDER — FENTANYL CITRATE (PF) 100 MCG/2ML IJ SOLN
INTRAMUSCULAR | Status: DC | PRN
  Administered 2017-02-04 (×2): 25 ug via INTRAVENOUS

## 2017-02-04 MED ORDER — MAGNESIUM SULFATE 50 % IJ SOLN
40.0000 meq | INTRAMUSCULAR | Status: DC
  Filled 2017-02-04 (×2): qty 10

## 2017-02-04 MED ORDER — SODIUM BICARBONATE 8.4 % IV SOLN
INTRAVENOUS | Status: AC
  Filled 2017-02-04: qty 50

## 2017-02-04 MED ORDER — SODIUM CHLORIDE 0.9 % IV SOLN
INTRAVENOUS | Status: DC
  Filled 2017-02-04 (×2): qty 30

## 2017-02-04 MED ORDER — SODIUM CHLORIDE 0.9% FLUSH
3.0000 mL | Freq: Two times a day (BID) | INTRAVENOUS | Status: DC
  Administered 2017-02-05: 3 mL via INTRAVENOUS

## 2017-02-04 MED ORDER — MIDAZOLAM HCL 2 MG/2ML IJ SOLN
0.5000 mg | INTRAMUSCULAR | Status: DC | PRN
Start: 2017-02-04 — End: 2017-02-05
  Filled 2017-02-04 (×2): qty 2

## 2017-02-04 SURGICAL SUPPLY — 13 items
BALLN LINEAR 7.5FR IABP 40CC (BALLOONS) ×2
BALLOON LINEAR 7.5FR IABP 40CC (BALLOONS) ×1 IMPLANT
CATH SWAN GANZ VIP 7.5F (CATHETERS) ×2 IMPLANT
DEVICE SECURE STATLOCK IABP (MISCELLANEOUS) ×4 IMPLANT
HOVERMATT SINGLE USE (MISCELLANEOUS) ×2 IMPLANT
SHEATH PINNACLE 6F 10CM (SHEATH) ×2 IMPLANT
SHEATH PINNACLE 8F 10CM (SHEATH) ×2 IMPLANT
SLEEVE REPOSITIONING LENGTH 30 (MISCELLANEOUS) ×4 IMPLANT
TRANSDUCER W/STOPCOCK (MISCELLANEOUS) ×2 IMPLANT
TUBING ART PRESS 72  MALE/FEM (TUBING) ×1
TUBING ART PRESS 72 MALE/FEM (TUBING) ×1 IMPLANT
WIRE EMERALD 3MM-J .025X260CM (WIRE) ×2 IMPLANT
WIRE EMERALD 3MM-J .035X150CM (WIRE) ×2 IMPLANT

## 2017-02-04 NOTE — Progress Notes (Signed)
Patient became very nauseated and vomited several times.  Patient stated she was dizzy and "swimmy-headed"  Patient's BP was noted to be 82/57.  Patient was lethargic and increasingly drowsy, still stating she felt badly.  Patient was noted to be cool and clammy.  Respiratory Response was called.  Cardiology PA was called.  Patient was given a 500 cc fluid bolus per MD order.  Patient continued to deteriorate.  Dr. Ellyn Hack and Dr. Irish Lack were present.  Patient was intubated and transferred to the cath lab emergently.

## 2017-02-04 NOTE — Progress Notes (Signed)
ANTICOAGULATION CONSULT NOTE - FOLLOW UP  Pharmacy Consult for Heparin Indication: 3vCAD  No Known Allergies  Patient Measurements: Height: 5\' 6"  (167.6 cm) Weight: 158 lb (71.7 kg) IBW/kg (Calculated) : 59.3  Heparin Dosing Weight: 72 kg  Vital Signs: Temp: 98.4 F (36.9 C) (02/14 0535) Temp Source: Oral (02/14 0535) BP: 140/81 (02/14 0535) Pulse Rate: 88 (02/14 0535)  Labs:  Recent Labs  02/03/17 1118 02/04/17 0211 02/04/17 1132  HGB 11.4* 10.2*  --   HCT 36.8 33.7*  --   PLT 219 192  --   LABPROT 14.4  --   --   INR 1.11  --   --   HEPARINUNFRC  --  0.21* 0.32  CREATININE 1.27* 1.15*  --   TROPONINI 6.48*  --   --     Estimated Creatinine Clearance: 40.3 mL/min (by C-G formula based on SCr of 1.15 mg/dL (H)).   Assessment: 40 YOF with recent CP and SOB concerning for NSTEMI vs [place]. The patient underwent a cardiac cath on 02/03/17 which showed 3vCAD.  Plans are to continue IV heparin while awaiting CVTS evaluation for possible CABG.    Heparin level is now therapeutic and toward the low end of normal.  No bleeding nor hematoma documented.   Goal of Therapy:  Heparin level 0.3-0.7 units/ml Monitor platelets by anticoagulation protocol: Yes    Plan:  - Increase heparin gtt slightly to 1000 units/hr - Daily heparin level and CBC    Kavir Savoca D. Mina Marble, PharmD, BCPS Pager:  [phone] 02/04/2017, 12:45 PM

## 2017-02-04 NOTE — Progress Notes (Signed)
[id number] Did not walk with pt as she stated she has had a busy day and she got some pain med for discomfort in chest. Discussed importance of mobility and IS after surgery. Does not have IS yet. Demonstrated how to get up and down without use of arms and discussed sternal precautions. Gave pt OHS booklet and care guide. Wrote down how to view pre op video. Son had concerns as pt may need Rehab after discharge. The family is to discuss if anyone available 24/7 after discharge. Told son that case manager and SW would be involved with them after surgery to discuss if needed. Graylon Good RN BSN 02/04/2017 2:15 PM

## 2017-02-04 NOTE — Progress Notes (Signed)
Progress Note  Patient Name: [name] [name] Date of Encounter: 02/04/2017  Primary Cardiologist: Dr Yvone Neu  Subjective   Pt is comfortable this morning. Has some chest pain more musculoskeletal in nature.   Inpatient Medications    Scheduled Meds: . aspirin EC  81 mg Oral Daily  . atorvastatin  80 mg Oral q1800  . calcium-vitamin D  1 tablet Oral Q breakfast  . carvedilol  3.125 mg Oral BID WC  . feeding supplement (ENSURE ENLIVE)  237 mL Oral BID BM  . hydrochlorothiazide  12.5 mg Oral Daily  . irbesartan  300 mg Oral Daily  . latanoprost  1 drop Both Eyes QHS  . linagliptin  5 mg Oral Daily   Continuous Infusions: . heparin 950 Units/hr (02/04/17 0304)   PRN Meds: acetaminophen, nitroGLYCERIN, ondansetron (ZOFRAN) IV, pneumococcal 23 valent vaccine   Vital Signs    Vitals:   02/03/17 1620 02/03/17 2033 02/04/17 0535  BP: (!) 147/69 132/80 140/81  Pulse: 86 88 88  Resp: 18 18 18   Temp: 98.1 F (36.7 C) 98.2 F (36.8 C) 98.4 F (36.9 C)  TempSrc: Oral Oral Oral  SpO2: 100% 99% 100%   No intake or output data in the 24 hours ending 02/04/17 0931 There were no vitals filed for this visit.  Telemetry    NSR  With rates in the 80's and 90's - Personally Reviewed  ECG    02/03/2017- possible ectopic atrial rhythm with inferior and anterolateral T-wave inversions concerning for ischemia   Physical Exam   GEN: No acute distress.   Neck: No JVD Cardiac: RRR, SEM murmurs, rubs, or gallops.  Respiratory: Clear to auscultation bilaterally. GI: Soft, nontender, non-distended  MS: No edema; No deformity. Neuro:  Nonfocal  Psych: Normal affect   Labs    Chemistry Recent Labs Lab 02/03/17 1118 02/04/17 0211  NA 142 141  K 3.6 3.6  CL 108 106  CO2 26 24  GLUCOSE 150* 141*  BUN 29* 22*  CREATININE 1.27* 1.15*  CALCIUM 9.5 9.2  PROT  --  5.8*  ALBUMIN  --  3.5  AST  --  72*  ALT  --  24  ALKPHOS  --  63  BILITOT  --  1.2  GFRNONAA 39* 44*    GFRAA 45* 51*  ANIONGAP 8 11     Hematology Recent Labs Lab 02/03/17 1118 02/04/17 0211  WBC 7.8 8.6  RBC 5.06 4.53  HGB 11.4* 10.2*  HCT 36.8 33.7*  MCV 72.6* 74.4*  MCH 22.6* 22.5*  MCHC 31.1* 30.3  RDW 19.8* 19.1*  PLT 219 192    Cardiac Enzymes Recent Labs Lab 02/03/17 1118  TROPONINI 6.48*   No results for input(s): TROPIPOC in the last 168 hours.   BNPNo results for input(s): BNP, PROBNP in the last 168 hours.   DDimer No results for input(s): DDIMER in the last 168 hours.   Radiology    Dg Chest 2 View  Result Date: 02/03/2017 CLINICAL DATA:  Unstable angina with symptoms beginning on February 7th. Nonsmoker. EXAM: CHEST  2 VIEW COMPARISON:  None in PACs FINDINGS: The lungs are well-expanded. The interstitial markings are coarse. There is no alveolar infiltrate or pleural effusion. The cardiac silhouette is mildly enlarged. The pulmonary vascularity is mildly engorged. There is tortuosity of the ascending and descending thoracic aorta with mural calcification. There is mild multilevel degenerative disc disease of the thoracic spine. IMPRESSION: Cardiomegaly with mild pulmonary interstitial prominence suggests low-grade  CHF. Alternatively chronic bronchitic-fibrotic changes could produce similar appearance of the pulmonary interstitium. There is no acute pneumonia nor pleural effusion. Thoracic aortic atherosclerosis. Electronically Signed   By: David  Martinique M.D.   On: 02/03/2017 10:56    Cardiac Studies  Echo pending  ____________________________________________________________________________________ Procedures 02/03/2017  Left Heart Cath and Coronary Angiography  Conclusion   Conclusions: 1. Significant three-vessel coronary artery disease, including sequential 70% and 80% proximal and mid LAD stenoses, 99% mid LCx lesion with TIMI-2 flow, and chronic subtotal occlusion of the mid RCA. 2. Mildly elevated left ventricular filling  pressure.  Recommendations: 1. Given history of new chest pain this weekend with minimal activity, diabetes, and three-vessel CAD, recommend transfer to [facility] for inpatient CABG evaluation. 2. Initiate heparin infusion 2 hours after TR band removal. 3. Aggressive secondary prevention with statin therapy and beta-blockade. 4. Transthoracic echocardiogram. 5. Given severe right subclavian artery tortuosity, recommend alternate approach for further heart catheterizations.  Nelva Bush, MD [place] Pager: [phone]     Patient Profile     82 y.o. female  with h/o DM2, HTN, and glaucoma who was seen by Dr. Yvone Neu on 2/13 as a new patient for evaluation of chest pain, was found to have unstable angina with EKG changes and underwent emergent cardiac cath that showed 3-vessel CAD and has been transferred to [facility] for evaluation of possible CABG.  Assessment & Plan    1. 3-vessel CAD/unstable angina: -By cardiac cath 02/03/17 as above -Troponin 6.48 -Admit to [facility] for evaluation of possible CABG, Trish to get in touch with TCTS -Heparin drip -Check echo as no LV gram was performed on cardiac catheterization -LDL 96, Hgb A1c 6.3 -Continue ASA 81 mg daily, Coreg 3.125 mg bid, Lipitor 80 mg daily  2. Murmur: -Check echo as above  3. DM/hyperglycemia: -A1C 6.3 -Continue home medications  4. HTN: -Well controlled  -Continue home medications  5. AKI: -Baseline unknown, Cr 1.27 --> 1.15 post cath -IV fluids post cardiac cath -Monitor  6. Glaucoma: -Continue home Xalatan  7. OA: -Hold Vimovo   8. Subclinical hypokalemia: -Replete to 4.0, K+ 3.6 today -Magnesium 2.1  9. Status post parathyroidectomy: -TSH 0.576 -Not on thyroid replacement therapy as an outpatient   Signed, Daune Perch, NP  02/04/2017, 9:31 AM    Patient examined chart reviewed some muscular pain this am SEMI with 3VD transferred for CABG. CVTS has not seen have asked Trish to  call again Continue heparin check ECG this  Am SEM and need for echo to check EF  Jenkins Rouge

## 2017-02-04 NOTE — Significant Event (Signed)
Rapid Response Event Note  Overview:  Called by RN for assistance with patient suddenly hypotensive. Time Called: 4514 Arrival Time: 1800 Event Type: Cardiac, Hypotension  Initial Focused Assessment:  Called by RN for assistance with patient suddenly hypotensive.  Patient was c/o nausea, vomited and had BM, while on commode she developed a headache and received zofran.  On my arrival to patients room, RN and family at bedside.  Patient lying in bed lethargic, c/o not feeling well and hot, denies CP or SOB.  Skin is clammy.  SBP 60's, HR 90, spo2 96% on RR 10.  Patient is currently confused, no other focal neuro deficit noted.  [place] PA at bedside.     Interventions:  NS 500cc bolus given, EKG done.  zoll pads placed. MD called to bedside.  BP 76/51, HR 87, another 500cc bolus given per MD.  Another EKg done, MD viewed.  Anesthesia called for intubation.  1855 Levophed hung at 60mcg/min.  NS bag 1000cc wide open, levophed titrated upat 1659 to 63mcg/min, 1900 74/56, 1906 patient intubated by anesthesia with 7.5, subglottic with color change.  1910 76/61.    Plan of Care (if not transferred):  Patienit transferred to cath lab with RT on vent, zoll and MD  Event Summary:     at      at          [facility]

## 2017-02-04 NOTE — Progress Notes (Signed)
[place] Progress Note Patient Name: [name] [name] DOB: July 02, 1935 MRN: [id number]   Date of Service  02/04/2017  HPI/Events of Note  Hypoxemia on ABG No sedation order  eICU Interventions  PAD protocol Fentanyl gtt, prn versed Increase PEEP to 8        [facility] 02/04/2017, 11:54 PM

## 2017-02-04 NOTE — Progress Notes (Signed)
ANTICOAGULATION CONSULT NOTE - Follow Up Consult  Pharmacy Consult for Heparin  Indication: Multi-vessel CAD  No Known Allergies   Vital Signs: Temp: 98.2 F (36.8 C) (02/13 2033) Temp Source: Oral (02/13 2033) BP: 132/80 (02/13 2033) Pulse Rate: 88 (02/13 2033)  Labs:  Recent Labs  02/03/17 1118 02/04/17 0211  HGB 11.4* 10.2*  HCT 36.8 33.7*  PLT 219 192  LABPROT 14.4  --   INR 1.11  --   HEPARINUNFRC  --  0.21*  CREATININE 1.27* 1.15*  TROPONINI 6.48*  --     Estimated Creatinine Clearance: 40.3 mL/min (by C-G formula based on SCr of 1.15 mg/dL (H)).   Assessment: On heparin s/p cath for multi-vessel CAD, awaiting CVTS consult, heparin level low, no issues per RN.   Goal of Therapy:  Heparin level 0.3-0.7 units/ml Monitor platelets by anticoagulation protocol: Yes   Plan:  -Inc heparin to 950 units/hr -1200 HL  [name] [name] 02/04/2017,2:55 AM

## 2017-02-04 NOTE — Progress Notes (Addendum)
   Called to evaluate patient due to decreased mental status and hypotension. She had been doing well all day. She then became more lethargic. She was arousable only to loud voice or mechanical stimulus. She would wake up slightly and be able to nod her head yes or no but then would quickly go back to sleep. Blood pressures were in the 16X to 09U systolic. She did not report any chest pain but she was diaphoretic. Initial ECG performed showed diffuse ST segment depression, suggestive of global ischemia.  Exam: Lethargic Shallow respirations, no wheezing Regular rate and rhythm 2+ right femoral pulse Skin cool to touch  ECG as noted above.  I spoke to Dr. Ellyn Hack who is the intervention was on call. We discussed plan for balloon pump placement versus Impella for support device. I called the on-call surgeon who agreed with balloon pump.  Dr. Ellyn Hack and I discussed the case and decided the patient needed to be intubated as well given this cardiogenic shock presentation. The cath lab team was called in emergently for plan for balloon pump placement and Swan-Ganz catheter placement.  The patient will be moved to the ICU. I discussed the situation with the family who were at bedside. The chaplain was also available to them for support.  Critical care time 45 minutes  Jettie Booze, MD

## 2017-02-04 NOTE — Progress Notes (Signed)
ANTICOAGULATION CONSULT NOTE - FOLLOW UP  Pharmacy Consult for Heparin Indication: 3vCAD--> now s/p IABP  No Known Allergies  Patient Measurements: Height: 5\' 6"  (167.6 cm) Weight: 158 lb (71.7 kg) IBW/kg (Calculated) : 59.3  Heparin Dosing Weight: 72 kg  Vital Signs: Temp: 97.8 F (36.6 C) (02/14 1336) Temp Source: Oral (02/14 1336) BP: 113/79 (02/14 2100) Pulse Rate: 99 (02/14 2028)  Labs:  Recent Labs  02/03/17 1118 02/04/17 0211 02/04/17 1132  HGB 11.4* 10.2*  --   HCT 36.8 33.7*  --   PLT 219 192  --   LABPROT 14.4  --   --   INR 1.11  --   --   HEPARINUNFRC  --  0.21* 0.32  CREATININE 1.27* 1.15*  --   TROPONINI 6.48*  --   --     Estimated Creatinine Clearance: 40.3 mL/min (by C-G formula based on SCr of 1.15 mg/dL (H)).   Assessment: 62 YOF with recent CP and SOB concerning for NSTEMI vs [place]. The patient underwent a cardiac cath on 02/03/17 which showed 3vCAD.  Plans are to continue IV heparin while awaiting CVTS evaluation for possible CABG.    Now s/p IABP placement this evening, heparin to resume with lower goal range of 0.2 to 0.5  Goal of Therapy:  Heparin level = 0.2 to 0.5 Monitor platelets by anticoagulation protocol: Yes    Plan:  - Heparin at 800 units / hr - Daily heparin level and CBC  Follow up plans for CABG 2/15  Thank you Anette Guarneri, PharmD [phone]   02/04/2017, 9:35 PM

## 2017-02-04 NOTE — Progress Notes (Addendum)
Pre-op Cardiac Surgery  Carotid Findings:  Bilateral:  1-39% ICA stenosis.  Vertebral artery flow is antegrade.     Upper Extremity Right Left  Brachial Pressures 148 Triphasic 155 Triphasic  Radial Waveforms Triphasic Triphasic  Ulnar Waveforms Triphasic Triphasic  Palmar Arch (Allen's Test) Normal Normal   Findings:  Doppler waveforms remained normal bilaterally with both radial and ulnar compressions    Lower  Extremity Right Left  Dorsalis Pedis 145 Triphasic 158 Biphasic  Posterior Tibial 149 triphasic 163 Triphasic  Ankle/Brachial Indices 0.96 1.05    Findings:  ABIs indicate normal arterial flow bilaterally at rest.  Toma Copier, RVS 02/04/2017 04:30 PM

## 2017-02-04 NOTE — Anesthesia Procedure Notes (Signed)
Procedure Name: Intubation Date/Time: 02/04/2017 7:05 PM Performed by: Jeray Shugart S Pre-anesthesia Checklist: Emergency Drugs available, Suction available, Patient identified, Patient being monitored and Timeout performed Patient Re-evaluated:Patient Re-evaluated prior to inductionOxygen Delivery Method: Ambu bag Preoxygenation: Pre-oxygenation with 100% oxygen Intubation Type: IV induction Ventilation: Mask ventilation without difficulty Laryngoscope Size: Glidescope Tube type: Subglottic suction tube Tube size: 7.5 mm Number of attempts: 1 Airway Equipment and Method: Video-laryngoscopy Placement Confirmation: ETT inserted through vocal cords under direct vision,  CO2 detector and breath sounds checked- equal and bilateral Secured at: 21 cm Tube secured with: Tape Dental Injury: Teeth and Oropharynx as per pre-operative assessment

## 2017-02-04 NOTE — Interval H&P Note (Signed)
History and Physical Interval Note:  02/04/2017 7:28 PM  [name] [name]  has presented today for surgery, with the diagnosis of Hypotension - Cardiogenic Shock with Multivessel CAD  The various methods of treatment have been discussed with the patient and family. After consideration of risks, benefits and other options for treatment, the patient has consented to  Procedure(s): IABP Insertion (N/A) Swan Ganx Insertion  as a surgical intervention .  The patient's history has been reviewed, patient examined, She has had a notabl change in status, stable for surgery.  I have reviewed the patient's chart and labs.  Questions were answered to the patient's satisfaction.     Glenetta Hew

## 2017-02-04 NOTE — Transfer of Care (Signed)
Emergent airway securement for cath lab procedure

## 2017-02-04 NOTE — Progress Notes (Signed)
  Initially called to see the patient around 1800 due to headache, vomiting, diaphoresis, and hypotension. BP at 80/56 when call received. Went to check on the patient and ordered 500 cc IVF bolus.  She was lethargic and minimally responsive to questions. EKG performed and showed significant ST depression along anterior leads. ZOLL Pads placed. Dr. Irish Lack notified and came to the bedside to examine the patient. He contacted Dr. Ellyn Hack who came to assess the patient and recommended balloon pump. Started on Levophed 10 mcg/min. Was intubated prior to going to the cath lab due to concerns about protecting her airway.   Family updated about patient's current conditions. Patient's belongings given to the family.  Signed, Erma Heritage, PA-C 02/04/2017, 7:35 PM Pager: [phone]

## 2017-02-04 NOTE — Consult Note (Signed)
PULMONARY / CRITICAL CARE MEDICINE   Name: [name] [name] MRN: [id number] DOB: 11/25/1935    ADMISSION DATE:  02/03/2017 CONSULTATION DATE:  02/04/2017  REFERRING MD:  Dr. Martinique  CHIEF COMPLAINT:  AMS  HISTORY OF PRESENT ILLNESS:  82 year old female with past medical history as below, which is significant for CAD, diabetes mellitus, and hypertension. She presented to cardiologist to/13 with complaints of chest pain and shortness of breath while doing light housework onset about 3 days prior to arrival. She had an EKG done in the clinic, which was concerning for ischemia. She was referred for urgent cardiac cath which revealed three-vessel CAD. She was admitted in transfer to [facility] for urgent CABG which was scheduled for 2/15. After arrival to [facility] on 2/14 she was noted to be lethargic and hypotensive after doing well all day. She was in cardiogenic shock secondary to coronary artery disease requiring intubation, vasopressors, and emergent blood pressure support with intra-aortic balloon pump. The procedure was without complication, and she was sent to the ICU for recovery. PCCM to assist with ventilator and medical management.   PAST MEDICAL HISTORY :  She  has a past medical history of [name]; Colon polyp (2010); Diabetes mellitus with complication ([place]); Glaucoma; and Hypertension.  PAST SURGICAL HISTORY: She  has a past surgical history that includes Colonoscopy (2010); Breast biopsy (Right, 1999); Appendectomy; Parathyroidectomy; and Left Heart Cath and Coronary Angiography (Left, 02/03/2017).  No Known Allergies  No current facility-administered medications on file prior to encounter.    Current Outpatient Prescriptions on File Prior to Encounter  Medication Sig  . acetaminophen (TYLENOL) 500 MG tablet Take 500 mg by mouth every 6 (six) hours as needed for mild pain.   . Calcium Carbonate-Vitamin D (CALCIUM + D PO) Take 1 tablet by mouth daily.  Marland Kitchen  latanoprost (XALATAN) 0.005 % ophthalmic solution Place 1 drop into both eyes at bedtime.   Marland Kitchen olmesartan-hydrochlorothiazide (BENICAR HCT) 40-12.5 MG per tablet Take 1 tablet by mouth daily.  . ONGLYZA 5 MG TABS tablet Take 1 tablet by mouth daily.  Marland Kitchen VIMOVO 500-20 MG TBEC Take 1 tablet by mouth daily.  . naproxen (NAPROSYN) 375 MG tablet Take 1 tablet (375 mg total) by mouth 2 (two) times daily. (Patient not taking: Reported on 02/03/2017)  . polyethylene glycol powder (GLYCOLAX/MIRALAX) powder 255 grams one bottle for colonoscopy prep (Patient not taking: Reported on 02/03/2017)  . [DISCONTINUED] Amlodipine-Valsartan-HCTZ 10-320-25 MG TABS Take 1 tablet by mouth daily.    FAMILY HISTORY:  Her indicated that her mother is deceased. She indicated that her father is deceased. She indicated that her sister is deceased. She indicated that only one of her four brothers is alive.    SOCIAL HISTORY: She  reports that she has never smoked. She has never used smokeless tobacco. She reports that she does not drink alcohol or use drugs.  REVIEW OF SYSTEMS:   Unable due to intubation and encephalopathy  SUBJECTIVE:    VITAL SIGNS: BP (!) 76/51 (BP Location: Right Arm)   Pulse 87   Temp 97.8 F (36.6 C) (Oral)   Resp 20   Ht 5\' 6"  (1.676 m)   Wt 71.7 kg (158 lb)   SpO2 96%   BMI 25.50 kg/m   HEMODYNAMICS:    VENTILATOR SETTINGS:    INTAKE / OUTPUT: No intake/output data recorded.  PHYSICAL EXAMINATION: General:  Elderly female in no acute distress on ventilator Neuro:  Sedated HEENT:  Normocephalic/atraumatic, PERRL, mild JVD Cardiovascular:  RRR, no MRG Lungs:  Clear bilateral breath sounds Abdomen:  Soft, nondistended Musculoskeletal:  No acute deformity Skin:  Grossly intact  LABS:  BMET  Recent Labs Lab 02/03/17 1118 02/04/17 0211  NA 142 141  K 3.6 3.6  CL 108 106  CO2 26 24  BUN 29* 22*  CREATININE 1.27* 1.15*  GLUCOSE 150* 141*    Electrolytes  Recent  Labs Lab 02/03/17 1118 02/03/17 1839 02/04/17 0211  CALCIUM 9.5  --  9.2  MG  --  2.1  --     CBC  Recent Labs Lab 02/03/17 1118 02/04/17 0211  WBC 7.8 8.6  HGB 11.4* 10.2*  HCT 36.8 33.7*  PLT 219 192    Coag's  Recent Labs Lab 02/03/17 1118  INR 1.11    Sepsis Markers No results for input(s): LATICACIDVEN, PROCALCITON, O2SATVEN in the last 168 hours.  ABG No results for input(s): PHART, PCO2ART, PO2ART in the last 168 hours.  Liver Enzymes  Recent Labs Lab 02/04/17 0211  AST 72*  ALT 24  ALKPHOS 63  BILITOT 1.2  ALBUMIN 3.5    Cardiac Enzymes  Recent Labs Lab 02/03/17 1118  TROPONINI 6.48*    Glucose  Recent Labs Lab 02/03/17 2203 02/04/17 0621 02/04/17 1121 02/04/17 1801  GLUCAP 146* 139* 135* 224*    Imaging No results found.   STUDIES:  LHC 2/13 > significant three-vessel coronary artery disease, including sequential 70% 80% proximal and mid LAD stenoses, mid left circumflex lesion, and chronic subtotal occlusion of the mid RCA. Echo 2/13 > LVEF 40-45%, hypokinesis of the anterior myocardium, akinesis of the anterolateral and inferior lateral myocardium. Grade 2 diastolic dysfunction.  CULTURES:   ANTIBIOTICS:  SIGNIFICANT EVENTS:   LINES/TUBES: L fem art sheath 2/14 > L fem Swan 2/14 >  DISCUSSION:   ASSESSMENT / PLAN:  PULMONARY A: Acute hypoxemic respiratory failure secondary to acute heart failure  P:   Full vent support Chest x-ray for ET tube placement ABG Vap prevention bundle  CARDIOVASCULAR A:  Cardiac shock secondary to acute heart failure Three-vessel CAD, severe P:  Telemetry monitoring M AP goal greater than 65 mmHg Levophed infusion titrated to map goal Cardiology primary For CABG 2/15 Did not tolerate dopamine well (SVT)  RENAL A:   Acute kidney injury  P:   Follow BMP Strict I&O  GASTROINTESTINAL A:   No acute issues  P:   Nothing by mouth Protonix IV for stress ulcer  prophylaxis  HEMATOLOGIC A:   Anemia, uncertain chronicity P:  Follow CBC Heparin infusion  INFECTIOUS A:   No acute issues P:   Follow WBC and fever curve   ENDOCRINE A:   Diabetes mellitus P:   CBG monitoring  Insulin infusion per primary  NEUROLOGIC A:   Acute metabolic encephalopathy in the setting of shock P:   RASS goal: -1 to -2 Fentanyl and Versed infusions   FAMILY  - Updates: Family updated by cardiology  - Inter-disciplinary family meet or Palliative Care meeting due by:  2/20   Georgann Housekeeper, AGACNP-BC [place] [facility]/Critical [facility] Pager [phone] or [phone]  02/04/2017 9:46 PM

## 2017-02-04 NOTE — Consult Note (Signed)
[address]       [place] [address]             [phone]        [name] [name] [place]in Medical Record #[id number] Date of Birth: [birth year]  Referring: No ref. provider found Primary Care: Marden Noble, MD  Primary Cardiologist: Dr. Yvone Neu  Chief Complaint:   Chest pain  History of Present Illness:     This is a 82 year old female with a past medical history of type 2 diabetes mellitus, hypertension, hyperlipidemia, acute kidney injury, and a history of glaucoma who was seen on 02/03/2017 by Dr. Farrel Conners with a new complaint of chest pain. She was found to have unstable angina with EKG changes and therefore underwent an emergent cardiac catheterization which showed three-vessel coronary artery disease. She also has a history of a murmur, therefore an echocardiogram was performed today. The results are pending. She does have a surgical history significant for parathyroidectomy in 1986. She is not on any thyroid replacement therapy. We were consulted for evaluation of candidacy for revascularization. She is currently on heparin drip and pain free.    Current Activity/ Functional Status: Patient is independent with mobility/ambulation, transfers, ADL's, IADL's.   Zubrod Score: At the time of surgery this patient's most appropriate activity status/level should be described as:  []     0    Normal activity, no symptoms [x]     1    Restricted in physical strenuous activity but ambulatory, able to do out light work []     2    Ambulatory and capable of self care, unable to do work activities, up and about                 more than 50%  Of the time                            []     3    Only limited self care, in bed greater than 50% of waking hours []     4    Completely disabled, no self care, confined to bed or chair []     5    Moribund  Past Medical History:  Diagnosis Date  . [name]    a. LHC 02/03/17 for unstable angina showed severe 3v CAD  w/ sequential 70% & 80% p-mLAD, 99% mLCx, CTO mRCA, elevated LV filling pressure  . Colon polyp 2010  . Diabetes mellitus with complication ([place])   . Glaucoma   . Hypertension     Past Surgical History:  Procedure Laterality Date  . APPENDECTOMY    . BREAST BIOPSY Right 1999  . COLONOSCOPY  2010  . LEFT HEART CATH AND CORONARY ANGIOGRAPHY Left 02/03/2017   Procedure: Left Heart Cath and Coronary Angiography;  Surgeon: Nelva Bush, MD;  Location: [facility];  Service: Cardiovascular;  Laterality: Left;  . PARATHYROIDECTOMY     1986    History  Smoking Status  . Never Smoker  Smokeless Tobacco  . Never Used    History  Alcohol Use No    Social History   Social History  . Marital status: Married    Spouse name: N/A  . Number of children: N/A  . Years of education: N/A   Occupational History  . Not on file.   Social History Main Topics  . Smoking status: Never Smoker  .  Smokeless tobacco: Never Used  . Alcohol use No  . Drug use: No  . Sexual activity: Not on file   Other Topics Concern  . Not on file   Social History Narrative  . No narrative on file    No Known Allergies  Current Facility-Administered Medications  Medication Dose Route Frequency Provider Last Rate Last Dose  . acetaminophen (TYLENOL) tablet 650 mg  650 mg Oral Q4H PRN Consuelo Pandy, PA-C   650 mg at 02/04/17 0943  . aspirin EC tablet 81 mg  81 mg Oral Daily Consuelo Pandy, PA-C   81 mg at 02/04/17 0943  . atorvastatin (LIPITOR) tablet 80 mg  80 mg Oral q1800 Brittainy Erie Noe, PA-C   80 mg at 02/03/17 1741  . calcium-vitamin D (OSCAL WITH D) 500-200 MG-UNIT per tablet 1 tablet  1 tablet Oral Q breakfast Consuelo Pandy, PA-C   1 tablet at 02/04/17 0943  . carvedilol (COREG) tablet 3.125 mg  3.125 mg Oral BID WC Brittainy M Simmons, PA-C   3.125 mg at 02/04/17 0943  . feeding supplement (ENSURE ENLIVE) (ENSURE ENLIVE) liquid 237 mL  237 mL Oral BID BM Peter M  Martinique, MD   237 mL at 02/04/17 0944  . heparin ADULT infusion 100 units/mL (25000 units/216mL sodium chloride 0.45%)  950 Units/hr Intravenous Continuous Erenest Blank, RPH 9.5 mL/hr at 02/04/17 0304 950 Units/hr at 02/04/17 0304  . hydrochlorothiazide (MICROZIDE) capsule 12.5 mg  12.5 mg Oral Daily Brittainy Erie Noe, PA-C   12.5 mg at 02/04/17 0943  . irbesartan (AVAPRO) tablet 300 mg  300 mg Oral Daily Brittainy Erie Noe, PA-C   300 mg at 02/04/17 5102  . latanoprost (XALATAN) 0.005 % ophthalmic solution 1 drop  1 drop Both Eyes QHS Brittainy Erie Noe, PA-C   1 drop at 02/03/17 2131  . linagliptin (TRADJENTA) tablet 5 mg  5 mg Oral Daily Brittainy Erie Noe, PA-C   5 mg at 02/04/17 0943  . nitroGLYCERIN (NITROSTAT) SL tablet 0.4 mg  0.4 mg Sublingual Q5 Min x 3 PRN Brittainy M Simmons, PA-C      . ondansetron [facility]) injection 4 mg  4 mg Intravenous Q6H PRN Brittainy Erie Noe, PA-C      . pneumococcal 23 valent vaccine (PNU-IMMUNE) injection 0.5 mL  0.5 mL Intramuscular Prior to discharge Peter M Martinique, MD        Prescriptions Prior to Admission  Medication Sig Dispense Refill Last Dose  . acetaminophen (TYLENOL) 500 MG tablet Take 500 mg by mouth every 6 (six) hours as needed for mild pain.    Past Week at Unknown time  . Calcium Carbonate-Vitamin D (CALCIUM + D PO) Take 1 tablet by mouth daily.   02/02/2017 at Unknown time  . latanoprost (XALATAN) 0.005 % ophthalmic solution Place 1 drop into both eyes at bedtime.    02/02/2017 at Unknown time  . olmesartan-hydrochlorothiazide (BENICAR HCT) 40-12.5 MG per tablet Take 1 tablet by mouth daily.   02/02/2017 at Unknown time  . ONGLYZA 5 MG TABS tablet Take 1 tablet by mouth daily.   02/02/2017 at Unknown time  . VIMOVO 500-20 MG TBEC Take 1 tablet by mouth daily.   02/02/2017 at Unknown time  . naproxen (NAPROSYN) 375 MG tablet Take 1 tablet (375 mg total) by mouth 2 (two) times daily. (Patient not taking: Reported on 02/03/2017) 20 tablet 0  Not Taking at Unknown time  . polyethylene glycol powder (GLYCOLAX/MIRALAX) powder 255  grams one bottle for colonoscopy prep (Patient not taking: Reported on 02/03/2017) 255 g 0 Not Taking at Unknown time    Family History  Problem Relation Age of Onset  . Diabetes Father   . Diabetes Sister      Review of Systems:  Pertinent items are noted in HPI.     Cardiac Review of Systems: Y or N  Chest Pain Jazmín.Cullens    ]  Resting SOB [ N  ] Exertional SOB  [Y  ]  Orthopnea [  ]   Pedal Edema [  N ]    Palpitations [ N ] Syncope  [  ]   Presyncope [   ]  General Review of Systems: [Y] = yes [  ]=no Constitional: recent weight change [ Y ]; anorexia [  ]; fatigue [Y  ]; nausea [ N ]; night sweats [  ]; fever [ N ]; or chills Aqua.Slicker  ]                                                               Dental: poor dentition[  ]; Last Dentist visit:   Eye : blurred vision [  ]; diplopia [   ]; vision changes [  ];  Amaurosis fugax[  ]; Resp: cough [ N ];  wheezing[  ];  hemoptysis[  ]; shortness of breath[N  ]; paroxysmal nocturnal dyspnea[  ]; dyspnea on exertion[  Y]; or orthopnea[  ];  GI:  gallstones[  ], vomiting[ N ];  dysphagia[N  ]; melena[ N ];  hematochezia [  ]; heartburn[  ];   Hx of  Colonoscopy[  ]; GU: kidney stones [  ]; hematuria[  ];   dysuria [N  ];  nocturia[  ];  history of     obstruction [  ]; urinary frequency Aqua.Slicker  ]             Skin: rash, swelling[ N ];, hair loss[  ];  peripheral edema[N  ];  or itching[  ]; Musculosketetal: myalgias[  ];  joint swelling[  ];  joint erythema[  ];  joint pain[  ];  back pain[  ];  Heme/Lymph: bruising[  ];  bleeding[  ];  anemia[  ];  Neuro: TIA[  ];  headaches[ N ];  stroke[  ];  vertigo[  ];  seizures[  ];   paresthesias[  Y];  difficulty walking[N  ];  Psych:depression[  ]; anxiety[  ];  Endocrine: diabetes[ Y ];  thyroid dysfunction[N  ];  Immunizations: Flu [  ]; Pneumococcal[  ];  Other:  Physical Exam: BP 140/81 (BP Location: Left Arm)   Pulse  88   Temp 98.4 F (36.9 C) (Oral)   Resp 18   SpO2 100%    General appearance: alert, cooperative, appears stated age and no distress Neck: no JVD and supple, symmetrical, trachea midline Resp: clear to auscultation bilaterally Cardio: regular rate and rhythm, S1, S2 normal, systolic murmur: holosystolic 2/6, blowing left anterior axillary line at 5th ICS and no rub GI: soft, non-tender; bowel sounds normal; no masses,  no organomegaly Extremities: extremities normal, atraumatic, no cyanosis or edema  Diagnostic Studies & Laboratory data:  Procedures   Left Heart Cath and Coronary Angiography  Conclusion  Conclusions: 1. Significant three-vessel coronary artery disease, including sequential 70% and 80% proximal and mid LAD stenoses, 99% mid LCx lesion with TIMI-2 flow, and chronic subtotal occlusion of the mid RCA. 2. Mildly elevated left ventricular filling pressure.  Recommendations: 1. Given history of new chest pain this weekend with minimal activity, diabetes, and three-vessel CAD, recommend transfer to [facility] for inpatient CABG evaluation. 2. Initiate heparin infusion 2 hours after TR band removal. 3. Aggressive secondary prevention with statin therapy and beta-blockade. 4. Transthoracic echocardiogram. 5. Given severe right subclavian artery tortuosity, recommend alternate approach for further heart catheterizations.  Nelva Bush, MD [facility] Pager: [phone]        Recent Radiology Findings:   Dg Chest 2 View  Result Date: 02/03/2017 CLINICAL DATA:  Unstable angina with symptoms beginning on February 7th. Nonsmoker. EXAM: CHEST  2 VIEW COMPARISON:  None in PACs FINDINGS: The lungs are well-expanded. The interstitial markings are coarse. There is no alveolar infiltrate or pleural effusion. The cardiac silhouette is mildly enlarged. The pulmonary vascularity is mildly engorged. There is tortuosity of the ascending and descending thoracic aorta with  mural calcification. There is mild multilevel degenerative disc disease of the thoracic spine. IMPRESSION: Cardiomegaly with mild pulmonary interstitial prominence suggests low-grade CHF. Alternatively chronic bronchitic-fibrotic changes could produce similar appearance of the pulmonary interstitium. There is no acute pneumonia nor pleural effusion. Thoracic aortic atherosclerosis. Electronically Signed   By: David  Martinique M.D.   On: 02/03/2017 10:56     I have independently reviewed the above radiologic studies.  Recent Lab Findings: Lab Results  Component Value Date   WBC 8.6 02/04/2017   HGB 10.2 (L) 02/04/2017   HCT 33.7 (L) 02/04/2017   PLT 192 02/04/2017   GLUCOSE 141 (H) 02/04/2017   CHOL 156 02/04/2017   TRIG 104 02/04/2017   HDL 42 02/04/2017   LDLCALC 93 02/04/2017   ALT 24 02/04/2017   AST 72 (H) 02/04/2017   NA 141 02/04/2017   K 3.6 02/04/2017   CL 106 02/04/2017   CREATININE 1.15 (H) 02/04/2017   BUN 22 (H) 02/04/2017   CO2 24 02/04/2017   TSH 0.576 02/03/2017   INR 1.11 02/03/2017   HGBA1C 6.3 (H) 02/03/2017      Assessment / Plan:   This patient is currently being worked up for candidacy for revascularization. The cardiac cath report and echocardiogram will be reviewed by the surgeon. The current plan is to either undergo coronary bypass grafting on Thursday or Friday of this week. The patient is on Lipitor, Coreg, Avapro and heparin gtt.      I  spent 30 minutes counseling the patient face to face and 50% or more the  time was spent in counseling and coordination of care. The total time spent in the appointment was 30 minutes.    Claiborne Billings Rayburn PA-S Tessa Conte,PA-C  02/04/2017 10:04 AM   Chart reviewed, patient examined, agree with above.  She had no prior cardiac history until she started having chest pain and nausea on Saturday and again Sunday. Admitted with NSTEMI with troponin 6.48 yesterday. Cath reviewed and shows severe 3 vessel coronary  disease with tight proximal LAD and LCX, occluded RCA. Her echo was reviewed and shows an EF of 45% with anterior hypokinesis, anterolateral and inferolateral akinesis with severe central MR that appears to be due to annular dilation. She has not had any significant SOB or orthopnea but had mild pulmonary interstitial edema on CXR. I agree that  CABG and MV repair are indicated. I discussed the operative procedure with the patient and her son including alternatives, benefits and risks; including but not limited to bleeding, blood transfusion, infection, stroke, myocardial infarction, graft failure, heart block requiring a permanent pacemaker, organ dysfunction, and death.  [name] [name] understands and agrees to proceed.  We will schedule surgery for tomorrow am.

## 2017-02-04 NOTE — H&P (View-Only) (Signed)
   Called to evaluate patient due to decreased mental status and hypotension. She had been doing well all day. She then became more lethargic. She was arousable only to loud voice or mechanical stimulus. She would wake up slightly and be able to nod her head yes or no but then would quickly go back to sleep. Blood pressures were in the 92T to 24M systolic. She did not report any chest pain but she was diaphoretic. Initial ECG performed showed diffuse ST segment depression, suggestive of global ischemia.  Exam: Lethargic Shallow respirations, no wheezing Regular rate and rhythm 2+ right femoral pulse Skin cool to touch  ECG as noted above.  I spoke to Dr. Ellyn Hack who is the intervention was on call. We discussed plan for balloon pump placement versus Impella for support device. I called the on-call surgeon who agreed with balloon pump.  Dr. Ellyn Hack and I discussed the case and decided the patient needed to be intubated as well given this cardiogenic shock presentation. The cath lab team was called in emergently for plan for balloon pump placement and Swan-Ganz catheter placement.  The patient will be moved to the ICU. I discussed the situation with the family who were at bedside. The chaplain was also available to them for support.  Critical care time 45 minutes  Jettie Booze, MD

## 2017-02-05 ENCOUNTER — Encounter (HOSPITAL_COMMUNITY)
Admission: AD | Disposition: A | Discharge status: Skilled Nursing Facility | Payer: Self-pay | Source: Other Acute Inpatient Hospital | Attending: Surgery

## 2017-02-05 ENCOUNTER — Inpatient Hospital Stay (HOSPITAL_COMMUNITY): Payer: Medicare Other

## 2017-02-05 ENCOUNTER — Inpatient Hospital Stay (HOSPITAL_COMMUNITY): Payer: Medicare Other | Admitting: Certified Registered"

## 2017-02-05 ENCOUNTER — Encounter (HOSPITAL_COMMUNITY): Payer: Self-pay | Admitting: *Deleted

## 2017-02-05 DIAGNOSIS — Z951 Presence of aortocoronary bypass graft: Secondary | ICD-10-CM

## 2017-02-05 DIAGNOSIS — J9601 Acute respiratory failure with hypoxia: Secondary | ICD-10-CM

## 2017-02-05 DIAGNOSIS — I340 Nonrheumatic mitral (valve) insufficiency: Secondary | ICD-10-CM

## 2017-02-05 HISTORY — PX: TEE WITHOUT CARDIOVERSION: SHX5443

## 2017-02-05 HISTORY — PX: CORONARY ARTERY BYPASS GRAFT: SHX141

## 2017-02-05 HISTORY — PX: MITRAL VALVE REPAIR: SHX2039

## 2017-02-05 LAB — POCT I-STAT 3, ART BLOOD GAS (G3+)
ACID-BASE DEFICIT: 6 mmol/L — AB (ref 0.0–2.0)
ACID-BASE DEFICIT: 6 mmol/L — AB (ref 0.0–2.0)
ACID-BASE DEFICIT: 4 mmol/L — AB (ref 0.0–2.0)
Acid-base deficit: 7 mmol/L — ABNORMAL HIGH (ref 0.0–2.0)
Acid-base deficit: 7 mmol/L — ABNORMAL HIGH (ref 0.0–2.0)
Acid-base deficit: 4 mmol/L — ABNORMAL HIGH (ref 0.0–2.0)
BICARBONATE: 17 mmol/L — AB (ref 20.0–28.0)
BICARBONATE: 17.4 mmol/L — AB (ref 20.0–28.0)
BICARBONATE: 18.2 mmol/L — AB (ref 20.0–28.0)
BICARBONATE: 23.1 mmol/L (ref 20.0–28.0)
Bicarbonate: 17.3 mmol/L — ABNORMAL LOW (ref 20.0–28.0)
Bicarbonate: 20.4 mmol/L (ref 20.0–28.0)
Bicarbonate: 23.4 mmol/L (ref 20.0–28.0)
O2 SAT: 99 %
O2 SAT: 100 %
O2 SAT: 100 %
O2 Saturation: 98 %
O2 Saturation: 100 %
O2 Saturation: 100 %
O2 Saturation: 99 %
PCO2 ART: 35.5 mmHg (ref 32.0–48.0)
PCO2 ART: 33.7 mmHg (ref 32.0–48.0)
PH ART: 7.416 (ref 7.350–7.450)
PH ART: 7.348 — AB (ref 7.350–7.450)
PH ART: 7.318 — AB (ref 7.350–7.450)
PO2 ART: 104 mmHg (ref 83.0–108.0)
PO2 ART: 117 mmHg — AB (ref 83.0–108.0)
PO2 ART: 324 mmHg — AB (ref 83.0–108.0)
Patient temperature: 98.8
TCO2: 18 mmol/L (ref 0–100)
TCO2: 18 mmol/L (ref 0–100)
TCO2: 18 mmol/L (ref 0–100)
TCO2: 19 mmol/L (ref 0–100)
TCO2: 25 mmol/L (ref 0–100)
TCO2: 21 mmol/L (ref 0–100)
TCO2: 24 mmol/L (ref 0–100)
pCO2 arterial: 23.9 mmHg — ABNORMAL LOW (ref 32.0–48.0)
pCO2 arterial: 27 mmHg — ABNORMAL LOW (ref 32.0–48.0)
pCO2 arterial: 31.7 mmHg — ABNORMAL LOW (ref 32.0–48.0)
pCO2 arterial: 50.4 mmHg — ABNORMAL HIGH (ref 32.0–48.0)
pCO2 arterial: 29.6 mmHg — ABNORMAL LOW (ref 32.0–48.0)
pH, Arterial: 7.459 — ABNORMAL HIGH (ref 7.350–7.450)
pH, Arterial: 7.269 — ABNORMAL LOW (ref 7.350–7.450)
pH, Arterial: 7.389 (ref 7.350–7.450)
pH, Arterial: 7.504 — ABNORMAL HIGH (ref 7.350–7.450)
pO2, Arterial: 185 mmHg — ABNORMAL HIGH (ref 83.0–108.0)
pO2, Arterial: 323 mmHg — ABNORMAL HIGH (ref 83.0–108.0)
pO2, Arterial: 142 mmHg — ABNORMAL HIGH (ref 83.0–108.0)
pO2, Arterial: 237 mmHg — ABNORMAL HIGH (ref 83.0–108.0)

## 2017-02-05 LAB — CBC
HCT: 26 % — ABNORMAL LOW (ref 36.0–46.0)
HEMATOCRIT: 31.8 % — AB (ref 36.0–46.0)
HEMATOCRIT: 29.8 % — AB (ref 36.0–46.0)
Hemoglobin: 9.7 g/dL — ABNORMAL LOW (ref 12.0–15.0)
Hemoglobin: 9.6 g/dL — ABNORMAL LOW (ref 12.0–15.0)
Hemoglobin: 8.4 g/dL — ABNORMAL LOW (ref 12.0–15.0)
MCH: 22.7 pg — ABNORMAL LOW (ref 26.0–34.0)
MCH: 24.2 pg — ABNORMAL LOW (ref 26.0–34.0)
MCH: 24.4 pg — AB (ref 26.0–34.0)
MCHC: 30.5 g/dL (ref 30.0–36.0)
MCHC: 32.2 g/dL (ref 30.0–36.0)
MCHC: 32.3 g/dL (ref 30.0–36.0)
MCV: 74.3 fL — ABNORMAL LOW (ref 78.0–100.0)
MCV: 75.1 fL — AB (ref 78.0–100.0)
MCV: 75.6 fL — ABNORMAL LOW (ref 78.0–100.0)
PLATELETS: 104 10*3/uL — AB (ref 150–400)
Platelets: 153 10*3/uL (ref 150–400)
Platelets: 114 10*3/uL — ABNORMAL LOW (ref 150–400)
RBC: 4.28 MIL/uL (ref 3.87–5.11)
RBC: 3.97 MIL/uL (ref 3.87–5.11)
RBC: 3.44 MIL/uL — ABNORMAL LOW (ref 3.87–5.11)
RDW: 19.6 % — AB (ref 11.5–15.5)
RDW: 18.6 % — ABNORMAL HIGH (ref 11.5–15.5)
RDW: 19 % — ABNORMAL HIGH (ref 11.5–15.5)
WBC: 12.5 10*3/uL — AB (ref 4.0–10.5)
WBC: 20.1 10*3/uL — AB (ref 4.0–10.5)
WBC: 13.9 10*3/uL — ABNORMAL HIGH (ref 4.0–10.5)

## 2017-02-05 LAB — POCT I-STAT, CHEM 8
BUN: 26 mg/dL — ABNORMAL HIGH (ref 6–20)
BUN: 23 mg/dL — ABNORMAL HIGH (ref 6–20)
BUN: 28 mg/dL — ABNORMAL HIGH (ref 6–20)
BUN: 26 mg/dL — ABNORMAL HIGH (ref 6–20)
BUN: 27 mg/dL — AB (ref 6–20)
BUN: 28 mg/dL — ABNORMAL HIGH (ref 6–20)
BUN: 30 mg/dL — AB (ref 6–20)
CALCIUM ION: 1.04 mmol/L — AB (ref 1.15–1.40)
CALCIUM ION: 1.01 mmol/L — AB (ref 1.15–1.40)
CALCIUM ION: 1.22 mmol/L (ref 1.15–1.40)
CHLORIDE: 106 mmol/L (ref 101–111)
CHLORIDE: 104 mmol/L (ref 101–111)
CHLORIDE: 108 mmol/L (ref 101–111)
CHLORIDE: 110 mmol/L (ref 101–111)
CREATININE: 2.4 mg/dL — AB (ref 0.44–1.00)
CREATININE: 2.1 mg/dL — AB (ref 0.44–1.00)
CREATININE: 2.2 mg/dL — AB (ref 0.44–1.00)
CREATININE: 2.3 mg/dL — AB (ref 0.44–1.00)
Calcium, Ion: 1.14 mmol/L — ABNORMAL LOW (ref 1.15–1.40)
Calcium, Ion: 0.96 mmol/L — ABNORMAL LOW (ref 1.15–1.40)
Calcium, Ion: 1.13 mmol/L — ABNORMAL LOW (ref 1.15–1.40)
Calcium, Ion: 0.91 mmol/L — ABNORMAL LOW (ref 1.15–1.40)
Chloride: 111 mmol/L (ref 101–111)
Chloride: 112 mmol/L — ABNORMAL HIGH (ref 101–111)
Chloride: 111 mmol/L (ref 101–111)
Creatinine, Ser: 2.5 mg/dL — ABNORMAL HIGH (ref 0.44–1.00)
Creatinine, Ser: 2.1 mg/dL — ABNORMAL HIGH (ref 0.44–1.00)
Creatinine, Ser: 2.3 mg/dL — ABNORMAL HIGH (ref 0.44–1.00)
GLUCOSE: 135 mg/dL — AB (ref 65–99)
GLUCOSE: 151 mg/dL — AB (ref 65–99)
GLUCOSE: 136 mg/dL — AB (ref 65–99)
GLUCOSE: 123 mg/dL — AB (ref 65–99)
GLUCOSE: 105 mg/dL — AB (ref 65–99)
Glucose, Bld: 141 mg/dL — ABNORMAL HIGH (ref 65–99)
Glucose, Bld: 159 mg/dL — ABNORMAL HIGH (ref 65–99)
HCT: 29 % — ABNORMAL LOW (ref 36.0–46.0)
HCT: 20 % — ABNORMAL LOW (ref 36.0–46.0)
HCT: 22 % — ABNORMAL LOW (ref 36.0–46.0)
HCT: 22 % — ABNORMAL LOW (ref 36.0–46.0)
HCT: 22 % — ABNORMAL LOW (ref 36.0–46.0)
HCT: 22 % — ABNORMAL LOW (ref 36.0–46.0)
HEMATOCRIT: 27 % — AB (ref 36.0–46.0)
HEMOGLOBIN: 9.9 g/dL — AB (ref 12.0–15.0)
HEMOGLOBIN: 9.2 g/dL — AB (ref 12.0–15.0)
Hemoglobin: 6.8 g/dL — CL (ref 12.0–15.0)
Hemoglobin: 7.5 g/dL — ABNORMAL LOW (ref 12.0–15.0)
Hemoglobin: 7.5 g/dL — ABNORMAL LOW (ref 12.0–15.0)
Hemoglobin: 7.5 g/dL — ABNORMAL LOW (ref 12.0–15.0)
Hemoglobin: 7.5 g/dL — ABNORMAL LOW (ref 12.0–15.0)
POTASSIUM: 3.1 mmol/L — AB (ref 3.5–5.1)
POTASSIUM: 3 mmol/L — AB (ref 3.5–5.1)
POTASSIUM: 3.2 mmol/L — AB (ref 3.5–5.1)
POTASSIUM: 3.4 mmol/L — AB (ref 3.5–5.1)
Potassium: 3.8 mmol/L (ref 3.5–5.1)
Potassium: 4.5 mmol/L (ref 3.5–5.1)
Potassium: 3.8 mmol/L (ref 3.5–5.1)
SODIUM: 142 mmol/L (ref 135–145)
SODIUM: 146 mmol/L — AB (ref 135–145)
Sodium: 145 mmol/L (ref 135–145)
Sodium: 145 mmol/L (ref 135–145)
Sodium: 148 mmol/L — ABNORMAL HIGH (ref 135–145)
Sodium: 144 mmol/L (ref 135–145)
Sodium: 145 mmol/L (ref 135–145)
TCO2: 18 mmol/L (ref 0–100)
TCO2: 18 mmol/L (ref 0–100)
TCO2: 21 mmol/L (ref 0–100)
TCO2: 29 mmol/L (ref 0–100)
TCO2: 24 mmol/L (ref 0–100)
TCO2: 24 mmol/L (ref 0–100)
TCO2: 24 mmol/L (ref 0–100)

## 2017-02-05 LAB — CREATININE, SERUM
CREATININE: 2.42 mg/dL — AB (ref 0.44–1.00)
GFR calc Af Amer: 21 mL/min — ABNORMAL LOW (ref >60)
GFR, EST NON AFRICAN AMERICAN: 18 mL/min — AB (ref >60)

## 2017-02-05 LAB — URINALYSIS, ROUTINE W REFLEX MICROSCOPIC
BILIRUBIN URINE: NEGATIVE
Glucose, UA: 100 mg/dL — AB
Ketones, ur: NEGATIVE mg/dL
Nitrite: POSITIVE — AB
SPECIFIC GRAVITY, URINE: 1.02 (ref 1.005–1.030)
pH: 6.5 (ref 5.0–8.0)

## 2017-02-05 LAB — MAGNESIUM
MAGNESIUM: 1.8 mg/dL (ref 1.7–2.4)
MAGNESIUM: 1.9 mg/dL (ref 1.7–2.4)

## 2017-02-05 LAB — COOXEMETRY PANEL
CARBOXYHEMOGLOBIN: 0.7 % (ref 0.5–1.5)
Carboxyhemoglobin: 1.1 % (ref 0.5–1.5)
Methemoglobin: 0.7 % (ref 0.0–1.5)
Methemoglobin: 1 % (ref 0.0–1.5)
O2 SAT: 77.6 %
O2 Saturation: 53.1 %
TOTAL HEMOGLOBIN: 10 g/dL — AB (ref 12.0–16.0)
Total hemoglobin: 9.9 g/dL — ABNORMAL LOW (ref 12.0–16.0)

## 2017-02-05 LAB — PROTIME-INR
INR: 1.87
Prothrombin Time: 21.8 seconds — ABNORMAL HIGH (ref 11.4–15.2)

## 2017-02-05 LAB — GLUCOSE, CAPILLARY
GLUCOSE-CAPILLARY: 121 mg/dL — AB (ref 65–99)
GLUCOSE-CAPILLARY: 121 mg/dL — AB (ref 65–99)
Glucose-Capillary: 203 mg/dL — ABNORMAL HIGH (ref 65–99)
Glucose-Capillary: 118 mg/dL — ABNORMAL HIGH (ref 65–99)
Glucose-Capillary: 109 mg/dL — ABNORMAL HIGH (ref 65–99)
Glucose-Capillary: 94 mg/dL (ref 65–99)

## 2017-02-05 LAB — VAS US DOPPLER PRE CABG
LCCADSYS: -30 cm/s
LCCAPDIAS: -7 cm/s
LEFT ECA DIAS: -8 cm/s
LEFT VERTEBRAL DIAS: -7 cm/s
LICADDIAS: -13 cm/s
Left CCA dist dias: -10 cm/s
Left CCA prox sys: -41 cm/s
Left ICA dist sys: -40 cm/s
Left ICA prox dias: -11 cm/s
Left ICA prox sys: -35 cm/s
RCCAPSYS: -42 cm/s
RIGHT ECA DIAS: -8 cm/s
RIGHT VERTEBRAL DIAS: -12 cm/s
Right CCA prox dias: -12 cm/s
Right cca dist sys: -47 cm/s

## 2017-02-05 LAB — COMPREHENSIVE METABOLIC PANEL
ALK PHOS: 91 U/L (ref 38–126)
ALT: 313 U/L — AB (ref 14–54)
ANION GAP: 9 (ref 5–15)
AST: 527 U/L — ABNORMAL HIGH (ref 15–41)
Albumin: 2.7 g/dL — ABNORMAL LOW (ref 3.5–5.0)
BILIRUBIN TOTAL: 1.5 mg/dL — AB (ref 0.3–1.2)
BUN: 28 mg/dL — ABNORMAL HIGH (ref 6–20)
CALCIUM: 7.9 mg/dL — AB (ref 8.9–10.3)
CO2: 19 mmol/L — AB (ref 22–32)
CREATININE: 2.13 mg/dL — AB (ref 0.44–1.00)
Chloride: 114 mmol/L — ABNORMAL HIGH (ref 101–111)
GFR, EST AFRICAN AMERICAN: 24 mL/min — AB (ref >60)
GFR, EST NON AFRICAN AMERICAN: 21 mL/min — AB (ref >60)
Glucose, Bld: 221 mg/dL — ABNORMAL HIGH (ref 65–99)
Potassium: 3.4 mmol/L — ABNORMAL LOW (ref 3.5–5.1)
SODIUM: 142 mmol/L (ref 135–145)
TOTAL PROTEIN: 4.7 g/dL — AB (ref 6.5–8.1)

## 2017-02-05 LAB — URINALYSIS, MICROSCOPIC (REFLEX)

## 2017-02-05 LAB — APTT
APTT: 83 s — AB (ref 24–36)
aPTT: 61 seconds — ABNORMAL HIGH (ref 24–36)

## 2017-02-05 LAB — SURGICAL PCR SCREEN
MRSA, PCR: NEGATIVE
STAPHYLOCOCCUS AUREUS: NEGATIVE

## 2017-02-05 LAB — HEMOGLOBIN AND HEMATOCRIT, BLOOD
HCT: 23.5 % — ABNORMAL LOW (ref 36.0–46.0)
HEMOGLOBIN: 7.5 g/dL — AB (ref 12.0–15.0)

## 2017-02-05 LAB — PHOSPHORUS: PHOSPHORUS: 2.6 mg/dL (ref 2.5–4.6)

## 2017-02-05 LAB — POCT I-STAT 4, (NA,K, GLUC, HGB,HCT)
GLUCOSE: 109 mg/dL — AB (ref 65–99)
HEMATOCRIT: 26 % — AB (ref 36.0–46.0)
Hemoglobin: 8.8 g/dL — ABNORMAL LOW (ref 12.0–15.0)
POTASSIUM: 3.6 mmol/L (ref 3.5–5.1)
SODIUM: 145 mmol/L (ref 135–145)

## 2017-02-05 LAB — PREPARE RBC (CROSSMATCH)

## 2017-02-05 LAB — HEPARIN LEVEL (UNFRACTIONATED): HEPARIN UNFRACTIONATED: 0.23 [IU]/mL — AB (ref 0.30–0.70)

## 2017-02-05 LAB — LACTIC ACID, PLASMA: Lactic Acid, Venous: 1.6 mmol/L (ref 0.5–1.9)

## 2017-02-05 LAB — PLATELET COUNT: Platelets: 70 10*3/uL — ABNORMAL LOW (ref 150–400)

## 2017-02-05 SURGERY — Surgical Case
Anesthesia: *Unknown

## 2017-02-05 SURGERY — CORONARY ARTERY BYPASS GRAFTING (CABG)
Anesthesia: General | Site: Chest

## 2017-02-05 MED ORDER — HEPARIN SODIUM (PORCINE) 1000 UNIT/ML IJ SOLN
INTRAMUSCULAR | Status: DC | PRN
  Administered 2017-02-05: 19000 [IU] via INTRAVENOUS

## 2017-02-05 MED ORDER — MAGNESIUM SULFATE 50 % IJ SOLN: 40.0000 meq | INTRAMUSCULAR | Status: DC

## 2017-02-05 MED ORDER — CEFUROXIME SODIUM 1.5 G IJ SOLR
1.5000 g | INTRAMUSCULAR | Status: AC
  Administered 2017-02-05: 1.5 g via INTRAVENOUS
  Administered 2017-02-05: .75 g via INTRAVENOUS
  Filled 2017-02-05 (×2): qty 1.5

## 2017-02-05 MED ORDER — LACTATED RINGERS IV SOLN
INTRAVENOUS | Status: DC
  Administered 2017-02-05 (×2): via INTRAVENOUS

## 2017-02-05 MED ORDER — SODIUM CHLORIDE 0.9 % IV SOLN: INTRAVENOUS | Status: DC

## 2017-02-05 MED ORDER — DOBUTAMINE IN D5W 4-5 MG/ML-% IV SOLN
2.5000 ug/kg/min | INTRAVENOUS | Status: DC
  Administered 2017-02-05: 2.5 ug/kg/min via INTRAVENOUS
  Filled 2017-02-05: qty 250

## 2017-02-05 MED ORDER — METOPROLOL TARTRATE 25 MG/10 ML ORAL SUSPENSION: 12.5000 mg | Freq: Two times a day (BID) | ORAL | Status: DC

## 2017-02-05 MED ORDER — METOPROLOL TARTRATE 5 MG/5ML IV SOLN: 2.5000 mg | INTRAVENOUS | Status: DC | PRN

## 2017-02-05 MED ORDER — TRANEXAMIC ACID (OHS) BOLUS VIA INFUSION: 15.0000 mg/kg | INTRAVENOUS | Status: DC

## 2017-02-05 MED ORDER — DEXMEDETOMIDINE HCL IN NACL 400 MCG/100ML IV SOLN
0.0000 ug/kg/h | INTRAVENOUS | Status: DC
  Administered 2017-02-05 – 2017-02-09 (×17): 0.7 ug/kg/h via INTRAVENOUS
  Filled 2017-02-05 (×3): qty 100
  Filled 2017-02-05: qty 50
  Filled 2017-02-05 (×2): qty 100
  Filled 2017-02-05 (×2): qty 50
  Filled 2017-02-05: qty 100
  Filled 2017-02-05: qty 50
  Filled 2017-02-05: qty 100
  Filled 2017-02-05: qty 50
  Filled 2017-02-05: qty 100
  Filled 2017-02-05: qty 50
  Filled 2017-02-05: qty 100
  Filled 2017-02-05: qty 50

## 2017-02-05 MED ORDER — HEMOSTATIC AGENTS (NO CHARGE) OPTIME
TOPICAL | Status: DC | PRN
  Administered 2017-02-05: 1 via TOPICAL

## 2017-02-05 MED ORDER — PANTOPRAZOLE SODIUM 40 MG PO TBEC: 40.0000 mg | DELAYED_RELEASE_TABLET | Freq: Every day | ORAL | Status: DC

## 2017-02-05 MED ORDER — SODIUM CHLORIDE 0.9 % IV SOLN: 250.0000 mL | INTRAVENOUS | Status: DC

## 2017-02-05 MED ORDER — MORPHINE SULFATE (PF) 2 MG/ML IV SOLN
2.0000 mg | INTRAVENOUS | Status: DC | PRN
  Administered 2017-02-06 – 2017-02-07 (×11): 4 mg via INTRAVENOUS
  Administered 2017-02-08: 2 mg via INTRAVENOUS
  Administered 2017-02-08: 4 mg via INTRAVENOUS
  Administered 2017-02-08: 2 mg via INTRAVENOUS
  Administered 2017-02-08 – 2017-02-09 (×4): 4 mg via INTRAVENOUS
  Administered 2017-02-09: 2 mg via INTRAVENOUS
  Filled 2017-02-05 (×19): qty 2

## 2017-02-05 MED ORDER — MORPHINE SULFATE (PF) 2 MG/ML IV SOLN: 1.0000 mg | INTRAVENOUS | Status: AC | PRN

## 2017-02-05 MED ORDER — SODIUM CHLORIDE 0.9 % IV SOLN
INTRAVENOUS | Status: AC
  Administered 2017-02-05: 1.5 [IU]/h via INTRAVENOUS
  Filled 2017-02-05 (×2): qty 2.5

## 2017-02-05 MED ORDER — THROMBIN 20000 UNITS EX SOLR
OROMUCOSAL | Status: DC | PRN
  Administered 2017-02-05: 4 mL via TOPICAL
  Administered 2017-02-05 (×2): via TOPICAL

## 2017-02-05 MED ORDER — SODIUM CHLORIDE 0.9% FLUSH
3.0000 mL | Freq: Two times a day (BID) | INTRAVENOUS | Status: DC
  Administered 2017-02-06 – 2017-02-18 (×17): 3 mL via INTRAVENOUS

## 2017-02-05 MED ORDER — TRANEXAMIC ACID 1000 MG/10ML IV SOLN
1.5000 mg/kg/h | INTRAVENOUS | Status: AC
  Administered 2017-02-05: 1.5 mg/kg/h via INTRAVENOUS
  Filled 2017-02-05 (×2): qty 25

## 2017-02-05 MED ORDER — FENTANYL CITRATE (PF) 250 MCG/5ML IJ SOLN
INTRAMUSCULAR | Status: AC
  Filled 2017-02-05: qty 20

## 2017-02-05 MED ORDER — SODIUM CHLORIDE 0.9 % IV SOLN: 30.0000 ug/min | INTRAVENOUS | Status: DC

## 2017-02-05 MED ORDER — CHLORHEXIDINE GLUCONATE 0.12 % MT SOLN
15.0000 mL | Freq: Once | OROMUCOSAL | Status: AC
  Administered 2017-02-05: 15 mL via OROMUCOSAL

## 2017-02-05 MED ORDER — PHENYLEPHRINE 40 MCG/ML (10ML) SYRINGE FOR IV PUSH (FOR BLOOD PRESSURE SUPPORT)
PREFILLED_SYRINGE | INTRAVENOUS | Status: AC
  Filled 2017-02-05: qty 20

## 2017-02-05 MED ORDER — MIDAZOLAM HCL 2 MG/2ML IJ SOLN
2.0000 mg | INTRAMUSCULAR | Status: DC | PRN
  Administered 2017-02-06 – 2017-02-09 (×18): 2 mg via INTRAVENOUS
  Filled 2017-02-05 (×20): qty 2

## 2017-02-05 MED ORDER — PROTAMINE SULFATE 10 MG/ML IV SOLN
INTRAVENOUS | Status: DC | PRN
  Administered 2017-02-05: 190 mg via INTRAVENOUS

## 2017-02-05 MED ORDER — ASPIRIN 81 MG PO CHEW
324.0000 mg | CHEWABLE_TABLET | Freq: Every day | ORAL | Status: DC
  Administered 2017-02-06 – 2017-02-08 (×3): 324 mg
  Filled 2017-02-05 (×4): qty 4

## 2017-02-05 MED ORDER — SODIUM CHLORIDE 0.9 % IV SOLN: 1.5000 mg/kg/h | INTRAVENOUS | Status: DC

## 2017-02-05 MED ORDER — TRAMADOL HCL 50 MG PO TABS: 50.0000 mg | ORAL_TABLET | ORAL | Status: DC | PRN

## 2017-02-05 MED ORDER — METOPROLOL TARTRATE 12.5 MG HALF TABLET: 12.5000 mg | ORAL_TABLET | Freq: Two times a day (BID) | ORAL | Status: DC

## 2017-02-05 MED ORDER — VANCOMYCIN HCL IN DEXTROSE 1-5 GM/200ML-% IV SOLN
1000.0000 mg | Freq: Once | INTRAVENOUS | Status: DC
  Filled 2017-02-05: qty 200

## 2017-02-05 MED ORDER — DEXTROSE 5 % IV SOLN: 750.0000 mg | INTRAVENOUS | Status: DC

## 2017-02-05 MED ORDER — CHLORHEXIDINE GLUCONATE CLOTH 2 % EX PADS
6.0000 | MEDICATED_PAD | Freq: Once | CUTANEOUS | Status: AC
  Administered 2017-02-05: 6 via TOPICAL

## 2017-02-05 MED ORDER — DEXTROSE 5 % IV SOLN
750.0000 mg | INTRAVENOUS | Status: DC
  Filled 2017-02-05 (×2): qty 750

## 2017-02-05 MED ORDER — TRANEXAMIC ACID (OHS) BOLUS VIA INFUSION
15.0000 mg/kg | INTRAVENOUS | Status: AC
  Administered 2017-02-05: 1164 mg via INTRAVENOUS
  Filled 2017-02-05: qty 1164

## 2017-02-05 MED ORDER — PLASMA-LYTE 148 IV SOLN
INTRAVENOUS | Status: AC
  Administered 2017-02-05: 500 mL
  Filled 2017-02-05 (×3): qty 2.5

## 2017-02-05 MED ORDER — ALBUMIN HUMAN 5 % IV SOLN
250.0000 mL | INTRAVENOUS | Status: AC | PRN
  Administered 2017-02-05 (×3): 250 mL via INTRAVENOUS
  Filled 2017-02-05: qty 250

## 2017-02-05 MED ORDER — LACTATED RINGERS IV SOLN
INTRAVENOUS | Status: DC
  Administered 2017-02-05 – 2017-02-14 (×4): via INTRAVENOUS

## 2017-02-05 MED ORDER — POTASSIUM CHLORIDE 2 MEQ/ML IV SOLN
80.0000 meq | INTRAVENOUS | Status: DC
  Filled 2017-02-05 (×2): qty 40

## 2017-02-05 MED ORDER — ACETAMINOPHEN 160 MG/5ML PO SOLN: 650.0000 mg | Freq: Once | ORAL | Status: AC

## 2017-02-05 MED ORDER — EPHEDRINE 5 MG/ML INJ
INTRAVENOUS | Status: AC
  Filled 2017-02-05: qty 10

## 2017-02-05 MED ORDER — SODIUM CHLORIDE 0.9 % IV SOLN
0.0000 ug/min | INTRAVENOUS | Status: DC
  Filled 2017-02-05: qty 2

## 2017-02-05 MED ORDER — FENTANYL CITRATE (PF) 250 MCG/5ML IJ SOLN
INTRAMUSCULAR | Status: DC | PRN
  Administered 2017-02-05 (×3): 100 ug via INTRAVENOUS
  Administered 2017-02-05: 250 ug via INTRAVENOUS
  Administered 2017-02-05 (×2): 100 ug via INTRAVENOUS
  Administered 2017-02-05: 250 ug via INTRAVENOUS

## 2017-02-05 MED ORDER — METOPROLOL TARTRATE 12.5 MG HALF TABLET: 12.5000 mg | ORAL_TABLET | Freq: Once | ORAL | Status: DC

## 2017-02-05 MED ORDER — FENTANYL CITRATE (PF) 100 MCG/2ML IJ SOLN: 50.0000 ug | Freq: Once | INTRAMUSCULAR | Status: AC

## 2017-02-05 MED ORDER — ACETAMINOPHEN 160 MG/5ML PO SOLN
1000.0000 mg | Freq: Four times a day (QID) | ORAL | Status: DC
  Administered 2017-02-05 – 2017-02-06 (×2): 1000 mg
  Filled 2017-02-05 (×2): qty 40.6

## 2017-02-05 MED ORDER — METOPROLOL TARTRATE 12.5 MG HALF TABLET: 12.5000 mg | ORAL_TABLET | ORAL | Status: DC

## 2017-02-05 MED ORDER — NITROGLYCERIN IN D5W 200-5 MCG/ML-% IV SOLN
0.0000 ug/min | INTRAVENOUS | Status: DC
  Administered 2017-02-09: 25 ug/min via INTRAVENOUS
  Administered 2017-02-10 (×3): 100 ug/min via INTRAVENOUS
  Filled 2017-02-05 (×4): qty 250

## 2017-02-05 MED ORDER — EPINEPHRINE PF 1 MG/ML IJ SOLN
0.0000 ug/min | INTRAMUSCULAR | Status: DC
  Filled 2017-02-05 (×2): qty 4

## 2017-02-05 MED ORDER — DOPAMINE-DEXTROSE 3.2-5 MG/ML-% IV SOLN
0.0000 ug/kg/min | INTRAVENOUS | Status: DC
  Filled 2017-02-05 (×2): qty 250

## 2017-02-05 MED ORDER — MIDAZOLAM HCL 10 MG/2ML IJ SOLN
INTRAMUSCULAR | Status: AC
  Filled 2017-02-05: qty 2

## 2017-02-05 MED ORDER — DIAZEPAM 2 MG PO TABS: 2.0000 mg | ORAL_TABLET | Freq: Once | ORAL | Status: DC

## 2017-02-05 MED ORDER — ROCURONIUM BROMIDE 50 MG/5ML IV SOSY
PREFILLED_SYRINGE | INTRAVENOUS | Status: AC
  Filled 2017-02-05: qty 25

## 2017-02-05 MED ORDER — NITROGLYCERIN IN D5W 200-5 MCG/ML-% IV SOLN: 2.0000 ug/min | INTRAVENOUS | Status: DC

## 2017-02-05 MED ORDER — NITROGLYCERIN IN D5W 200-5 MCG/ML-% IV SOLN
2.0000 ug/min | INTRAVENOUS | Status: AC
  Administered 2017-02-05: 16.6 ug/min via INTRAVENOUS
  Filled 2017-02-05: qty 250

## 2017-02-05 MED ORDER — TRANEXAMIC ACID (OHS) PUMP PRIME SOLUTION: 2.0000 mg/kg | INTRAVENOUS | Status: DC

## 2017-02-05 MED ORDER — ACETAMINOPHEN 500 MG PO TABS: 1000.0000 mg | ORAL_TABLET | Freq: Four times a day (QID) | ORAL | Status: DC

## 2017-02-05 MED ORDER — POTASSIUM CHLORIDE 2 MEQ/ML IV SOLN: 80.0000 meq | INTRAVENOUS | Status: DC

## 2017-02-05 MED ORDER — VANCOMYCIN HCL 10 G IV SOLR: 1250.0000 mg | INTRAVENOUS | Status: DC

## 2017-02-05 MED ORDER — CHLORHEXIDINE GLUCONATE 0.12 % MT SOLN
15.0000 mL | OROMUCOSAL | Status: AC
  Administered 2017-02-05: 15 mL via OROMUCOSAL

## 2017-02-05 MED ORDER — SODIUM CHLORIDE 0.9 % IV SOLN: INTRAVENOUS | Status: DC | PRN

## 2017-02-05 MED ORDER — ROCURONIUM BROMIDE 10 MG/ML (PF) SYRINGE
PREFILLED_SYRINGE | INTRAVENOUS | Status: DC | PRN
  Administered 2017-02-05: 100 mg via INTRAVENOUS
  Administered 2017-02-05: 50 mg via INTRAVENOUS

## 2017-02-05 MED ORDER — ASPIRIN 81 MG PO CHEW: 81.0000 mg | CHEWABLE_TABLET | Freq: Every day | ORAL | Status: DC

## 2017-02-05 MED ORDER — FUROSEMIDE 10 MG/ML IJ SOLN
INTRAMUSCULAR | Status: AC
  Filled 2017-02-05: qty 8

## 2017-02-05 MED ORDER — DOPAMINE-DEXTROSE 3.2-5 MG/ML-% IV SOLN: 0.0000 ug/kg/min | INTRAVENOUS | Status: DC

## 2017-02-05 MED ORDER — BISACODYL 10 MG RE SUPP: 10.0000 mg | Freq: Every day | RECTAL | Status: DC

## 2017-02-05 MED ORDER — ACETAMINOPHEN 650 MG RE SUPP
650.0000 mg | Freq: Once | RECTAL | Status: AC
  Administered 2017-02-05: 650 mg via RECTAL

## 2017-02-05 MED ORDER — EPINEPHRINE PF 1 MG/ML IJ SOLN: 0.0000 ug/min | INTRAVENOUS | Status: DC

## 2017-02-05 MED ORDER — LACTATED RINGERS IV SOLN
INTRAVENOUS | Status: DC | PRN
  Administered 2017-02-05 (×2): via INTRAVENOUS

## 2017-02-05 MED ORDER — MILRINONE LACTATE IN DEXTROSE 20-5 MG/100ML-% IV SOLN
0.3750 ug/kg/min | INTRAVENOUS | Status: DC
  Administered 2017-02-05: 0.375 ug/kg/min via INTRAVENOUS
  Filled 2017-02-05: qty 100

## 2017-02-05 MED ORDER — MILRINONE LACTATE IN DEXTROSE 20-5 MG/100ML-% IV SOLN
0.2500 ug/kg/min | INTRAVENOUS | Status: DC
  Administered 2017-02-05 – 2017-02-09 (×8): 0.375 ug/kg/min via INTRAVENOUS
  Administered 2017-02-10: 0.25 ug/kg/min via INTRAVENOUS
  Administered 2017-02-10: 0.375 ug/kg/min via INTRAVENOUS
  Filled 2017-02-05 (×11): qty 100

## 2017-02-05 MED ORDER — SODIUM CHLORIDE 0.45 % IV SOLN
INTRAVENOUS | Status: DC | PRN
  Administered 2017-02-05 – 2017-02-08 (×2): via INTRAVENOUS

## 2017-02-05 MED ORDER — BISACODYL 5 MG PO TBEC
10.0000 mg | DELAYED_RELEASE_TABLET | Freq: Every day | ORAL | Status: DC
Start: 2017-02-06 — End: 2017-02-07
  Administered 2017-02-06 – 2017-02-07 (×2): 10 mg via ORAL
  Filled 2017-02-05 (×2): qty 2

## 2017-02-05 MED ORDER — SODIUM CHLORIDE 0.9% FLUSH: 3.0000 mL | INTRAVENOUS | Status: DC | PRN

## 2017-02-05 MED ORDER — SODIUM CHLORIDE 0.9 % IV SOLN
INTRAVENOUS | Status: DC
  Filled 2017-02-05 (×2): qty 30

## 2017-02-05 MED ORDER — 0.9 % SODIUM CHLORIDE (POUR BTL) OPTIME
TOPICAL | Status: DC | PRN
  Administered 2017-02-05: 1000 mL

## 2017-02-05 MED ORDER — SODIUM CHLORIDE 0.9 % IV SOLN
INTRAVENOUS | Status: DC
  Administered 2017-02-06 – 2017-02-08 (×2): via INTRAVENOUS

## 2017-02-05 MED ORDER — INSULIN ASPART 100 UNIT/ML ~~LOC~~ SOLN
0.0000 [IU] | SUBCUTANEOUS | Status: DC
  Administered 2017-02-05: 5 [IU] via SUBCUTANEOUS

## 2017-02-05 MED ORDER — SODIUM CHLORIDE 0.9 % IV SOLN: 10.0000 mL/h | Freq: Once | INTRAVENOUS | Status: DC

## 2017-02-05 MED ORDER — SODIUM CHLORIDE 0.9 % IV SOLN
INTRAVENOUS | Status: DC
  Administered 2017-02-05: 2 [IU]/h via INTRAVENOUS
  Filled 2017-02-05 (×2): qty 2.5

## 2017-02-05 MED ORDER — DEXMEDETOMIDINE HCL IN NACL 400 MCG/100ML IV SOLN: 0.1000 ug/kg/h | INTRAVENOUS | Status: DC

## 2017-02-05 MED ORDER — NOREPINEPHRINE BITARTRATE 1 MG/ML IV SOLN
0.0000 ug/min | INTRAVENOUS | Status: DC
  Administered 2017-02-05: 20 ug/min via INTRAVENOUS
  Administered 2017-02-06: 15 ug/min via INTRAVENOUS
  Administered 2017-02-06 – 2017-02-07 (×2): 17 ug/min via INTRAVENOUS
  Filled 2017-02-05 (×5): qty 4

## 2017-02-05 MED ORDER — CEFUROXIME SODIUM 1.5 G IJ SOLR: 1.5000 g | INTRAMUSCULAR | Status: DC

## 2017-02-05 MED ORDER — INSULIN REGULAR BOLUS VIA INFUSION
0.0000 [IU] | Freq: Three times a day (TID) | INTRAVENOUS | Status: DC
  Filled 2017-02-05: qty 10

## 2017-02-05 MED ORDER — ASPIRIN EC 325 MG PO TBEC
325.0000 mg | DELAYED_RELEASE_TABLET | Freq: Every day | ORAL | Status: DC
  Administered 2017-02-09 – 2017-02-11 (×2): 325 mg via ORAL
  Filled 2017-02-05: qty 1

## 2017-02-05 MED ORDER — MIDAZOLAM HCL 2 MG/2ML IJ SOLN
4.0000 mg | Freq: Once | INTRAMUSCULAR | Status: AC
  Administered 2017-02-05: 4 mg via INTRAVENOUS

## 2017-02-05 MED ORDER — TRANEXAMIC ACID (OHS) PUMP PRIME SOLUTION
2.0000 mg/kg | INTRAVENOUS | Status: DC
Start: 2017-02-05 — End: 2017-02-05
  Filled 2017-02-05 (×2): qty 1.55

## 2017-02-05 MED ORDER — PLASMA-LYTE 148 IV SOLN: INTRAVENOUS | Status: DC

## 2017-02-05 MED ORDER — THROMBIN 20000 UNITS EX SOLR
CUTANEOUS | Status: DC | PRN
  Administered 2017-02-05: 20000 [IU] via TOPICAL

## 2017-02-05 MED ORDER — FAMOTIDINE IN NACL 20-0.9 MG/50ML-% IV SOLN
20.0000 mg | Freq: Two times a day (BID) | INTRAVENOUS | Status: AC
Start: 2017-02-05 — End: 2017-02-05
  Administered 2017-02-05 (×2): 20 mg via INTRAVENOUS
  Filled 2017-02-05: qty 50

## 2017-02-05 MED ORDER — LACTATED RINGERS IV SOLN: 500.0000 mL | Freq: Once | INTRAVENOUS | Status: DC | PRN

## 2017-02-05 MED ORDER — FUROSEMIDE 10 MG/ML IJ SOLN
INTRAMUSCULAR | Status: DC | PRN
  Administered 2017-02-05: 80 mg via INTRAMUSCULAR

## 2017-02-05 MED ORDER — ROCURONIUM BROMIDE 50 MG/5ML IV SOSY
PREFILLED_SYRINGE | INTRAVENOUS | Status: AC
  Filled 2017-02-05: qty 5

## 2017-02-05 MED ORDER — DEXMEDETOMIDINE HCL IN NACL 400 MCG/100ML IV SOLN
0.1000 ug/kg/h | INTRAVENOUS | Status: AC
  Administered 2017-02-05: 0.7 ug/kg/h via INTRAVENOUS
  Filled 2017-02-05 (×2): qty 100

## 2017-02-05 MED ORDER — POTASSIUM CHLORIDE 2 MEQ/ML IV SOLN: 30.0000 meq | Freq: Once | INTRAVENOUS | Status: DC

## 2017-02-05 MED ORDER — SODIUM CHLORIDE 0.9 % IV SOLN
30.0000 meq | Freq: Once | INTRAVENOUS | Status: AC
  Administered 2017-02-06: 30 meq via INTRAVENOUS
  Filled 2017-02-05: qty 15

## 2017-02-05 MED ORDER — MIDAZOLAM HCL 5 MG/5ML IJ SOLN
INTRAMUSCULAR | Status: DC | PRN
  Administered 2017-02-05 (×2): 5 mg via INTRAVENOUS

## 2017-02-05 MED ORDER — DEXTROSE 5 % IV SOLN
1.5000 g | Freq: Two times a day (BID) | INTRAVENOUS | Status: DC
  Administered 2017-02-05 – 2017-02-06 (×2): 1.5 g via INTRAVENOUS
  Filled 2017-02-05 (×3): qty 1.5

## 2017-02-05 MED ORDER — ONDANSETRON HCL 4 MG/2ML IJ SOLN
4.0000 mg | Freq: Four times a day (QID) | INTRAMUSCULAR | Status: DC | PRN
  Administered 2017-02-13 – 2017-02-19 (×2): 4 mg via INTRAVENOUS
  Filled 2017-02-05 (×2): qty 2

## 2017-02-05 MED ORDER — MAGNESIUM SULFATE 4 GM/100ML IV SOLN: 4.0000 g | Freq: Once | INTRAVENOUS | Status: DC

## 2017-02-05 MED ORDER — SODIUM CHLORIDE 0.9 % IV SOLN
30.0000 ug/min | INTRAVENOUS | Status: AC
  Administered 2017-02-05: 30 ug/min via INTRAVENOUS
  Filled 2017-02-05 (×2): qty 2

## 2017-02-05 MED ORDER — DOCUSATE SODIUM 100 MG PO CAPS
200.0000 mg | ORAL_CAPSULE | Freq: Every day | ORAL | Status: DC
  Filled 2017-02-05: qty 2

## 2017-02-05 MED ORDER — OXYCODONE HCL 5 MG PO TABS: 5.0000 mg | ORAL_TABLET | ORAL | Status: DC | PRN

## 2017-02-05 MED ORDER — VANCOMYCIN HCL 10 G IV SOLR
1250.0000 mg | INTRAVENOUS | Status: AC
  Administered 2017-02-05: 1250 mg via INTRAVENOUS
  Filled 2017-02-05 (×2): qty 1250

## 2017-02-05 MED ORDER — MAGNESIUM SULFATE 50 % IJ SOLN
40.0000 meq | INTRAMUSCULAR | Status: DC
  Filled 2017-02-05 (×2): qty 10

## 2017-02-05 MED FILL — Heparin Sodium (Porcine) Inj 1000 Unit/ML: INTRAMUSCULAR | Qty: 30 | Status: AC

## 2017-02-05 MED FILL — Magnesium Sulfate Inj 50%: INTRAMUSCULAR | Qty: 10 | Status: AC

## 2017-02-05 MED FILL — Potassium Chloride Inj 2 mEq/ML: INTRAVENOUS | Qty: 40 | Status: AC

## 2017-02-05 SURGICAL SUPPLY — 128 items
ADAPTER CARDIO PERF ANTE/RETRO (ADAPTER) ×3 IMPLANT
BAG DECANTER FOR FLEXI CONT (MISCELLANEOUS) ×3 IMPLANT
BANDAGE ACE 4X5 VEL STRL LF (GAUZE/BANDAGES/DRESSINGS) ×3 IMPLANT
BANDAGE ACE 6X5 VEL STRL LF (GAUZE/BANDAGES/DRESSINGS) ×3 IMPLANT
BANDAGE ELASTIC 4 VELCRO ST LF (GAUZE/BANDAGES/DRESSINGS) ×3 IMPLANT
BANDAGE ELASTIC 6 VELCRO ST LF (GAUZE/BANDAGES/DRESSINGS) ×3 IMPLANT
BASKET HEART (ORDER IN 25'S) (MISCELLANEOUS) ×1
BASKET HEART (ORDER IN 25S) (MISCELLANEOUS) ×2 IMPLANT
BIOPATCH RED 1 DISK 7.0 (GAUZE/BANDAGES/DRESSINGS) ×3 IMPLANT
BLADE STERNUM SYSTEM 6 (BLADE) ×3 IMPLANT
BLADE SURG 15 STRL LF DISP TIS (BLADE) ×2 IMPLANT
BLADE SURG 15 STRL SS (BLADE) ×1
BNDG GAUZE ELAST 4 BULKY (GAUZE/BANDAGES/DRESSINGS) ×6 IMPLANT
CANISTER SUCTION 2500CC (MISCELLANEOUS) ×3 IMPLANT
CANN PRFSN 3/8X14X24FR PCFC (MISCELLANEOUS) ×2
CANNULA GUNDRY RCSP 15FR (MISCELLANEOUS) ×3 IMPLANT
CANNULA PRFSN 3/8X14X24FR PCFC (MISCELLANEOUS) ×2 IMPLANT
CANNULA VEN MTL TIP RT (MISCELLANEOUS) ×1
CANNULA VRC MALB SNGL STG 34FR (MISCELLANEOUS) ×2 IMPLANT
CATH ROBINSON RED A/P 18FR (CATHETERS) ×9 IMPLANT
CATH THORACIC 28FR (CATHETERS) ×6 IMPLANT
CATH THORACIC 36FR (CATHETERS) ×3 IMPLANT
CATH THORACIC 36FR RT ANG (CATHETERS) ×3 IMPLANT
CLIP TI MEDIUM 24 (CLIP) IMPLANT
CLIP TI WIDE RED SMALL 24 (CLIP) ×3 IMPLANT
CONN 1/2X1/2X1/2  BEN (MISCELLANEOUS) ×1
CONN 1/2X1/2X1/2 BEN (MISCELLANEOUS) ×2 IMPLANT
CONN 3/8X1/2 ST GISH (MISCELLANEOUS) ×6 IMPLANT
COUNTER NEEDLE 20 DBL MAG RED (NEEDLE) ×3 IMPLANT
COVER SURGICAL LIGHT HANDLE (MISCELLANEOUS) ×6 IMPLANT
CRADLE DONUT ADULT HEAD (MISCELLANEOUS) ×3 IMPLANT
DRAPE CARDIOVASCULAR INCISE (DRAPES) ×1
DRAPE SLUSH/WARMER DISC (DRAPES) ×3 IMPLANT
DRAPE SRG 135X102X78XABS (DRAPES) ×2 IMPLANT
DRSG COVADERM 4X14 (GAUZE/BANDAGES/DRESSINGS) ×3 IMPLANT
ELECT CAUTERY BLADE 6.4 (BLADE) ×3 IMPLANT
ELECT REM PT RETURN 9FT ADLT (ELECTROSURGICAL) ×6
ELECTRODE REM PT RTRN 9FT ADLT (ELECTROSURGICAL) ×4 IMPLANT
FELT TEFLON 1X6 (MISCELLANEOUS) ×6 IMPLANT
GAUZE SPONGE 4X4 12PLY STRL (GAUZE/BANDAGES/DRESSINGS) ×6 IMPLANT
GLOVE BIO SURGEON STRL SZ 6 (GLOVE) IMPLANT
GLOVE BIO SURGEON STRL SZ 6.5 (GLOVE) ×18 IMPLANT
GLOVE BIO SURGEON STRL SZ7 (GLOVE) ×6 IMPLANT
GLOVE BIO SURGEON STRL SZ7.5 (GLOVE) IMPLANT
GLOVE BIOGEL PI IND STRL 6 (GLOVE) IMPLANT
GLOVE BIOGEL PI IND STRL 6.5 (GLOVE) IMPLANT
GLOVE BIOGEL PI IND STRL 7.0 (GLOVE) IMPLANT
GLOVE BIOGEL PI INDICATOR 6 (GLOVE)
GLOVE BIOGEL PI INDICATOR 6.5 (GLOVE)
GLOVE BIOGEL PI INDICATOR 7.0 (GLOVE)
GLOVE EUDERMIC 7 POWDERFREE (GLOVE) ×6 IMPLANT
GLOVE ORTHO TXT STRL SZ7.5 (GLOVE) IMPLANT
GOWN STRL REUS W/ TWL LRG LVL3 (GOWN DISPOSABLE) ×8 IMPLANT
GOWN STRL REUS W/ TWL XL LVL3 (GOWN DISPOSABLE) ×2 IMPLANT
GOWN STRL REUS W/TWL LRG LVL3 (GOWN DISPOSABLE) ×4
GOWN STRL REUS W/TWL XL LVL3 (GOWN DISPOSABLE) ×1
HEMOSTAT POWDER SURGIFOAM 1G (HEMOSTASIS) ×9 IMPLANT
HEMOSTAT SURGICEL 2X14 (HEMOSTASIS) ×3 IMPLANT
INSERT FOGARTY 61MM (MISCELLANEOUS) IMPLANT
INSERT FOGARTY XLG (MISCELLANEOUS) IMPLANT
KIT BASIN OR (CUSTOM PROCEDURE TRAY) ×3 IMPLANT
KIT CATH CPB BARTLE (MISCELLANEOUS) ×3 IMPLANT
KIT DRAINAGE VACCUM ASSIST (KITS) ×3 IMPLANT
KIT ROOM TURNOVER OR (KITS) ×3 IMPLANT
KIT SUCTION CATH 14FR (SUCTIONS) ×3 IMPLANT
KIT VASOVIEW HEMOPRO VH 3000 (KITS) ×3 IMPLANT
LINE VENT (MISCELLANEOUS) ×3 IMPLANT
LOOP VESSEL SUPERMAXI WHITE (MISCELLANEOUS) ×3 IMPLANT
NS IRRIG 1000ML POUR BTL (IV SOLUTION) ×18 IMPLANT
PACK OPEN HEART (CUSTOM PROCEDURE TRAY) ×3 IMPLANT
PAD ARMBOARD 7.5X6 YLW CONV (MISCELLANEOUS) ×6 IMPLANT
PAD ELECT DEFIB RADIOL ZOLL (MISCELLANEOUS) ×3 IMPLANT
PENCIL BUTTON HOLSTER BLD 10FT (ELECTRODE) ×3 IMPLANT
PUNCH AORTIC ROT 4.0MM RCL 40 (MISCELLANEOUS) ×3 IMPLANT
PUNCH AORTIC ROTATE 4.0MM (MISCELLANEOUS) IMPLANT
PUNCH AORTIC ROTATE 4.5MM 8IN (MISCELLANEOUS) IMPLANT
PUNCH AORTIC ROTATE 5MM 8IN (MISCELLANEOUS) IMPLANT
SET CARDIOPLEGIA MPS 5001102 (MISCELLANEOUS) ×3 IMPLANT
SPONGE GAUZE 4X4 12PLY STER LF (GAUZE/BANDAGES/DRESSINGS) ×9 IMPLANT
SPONGE INTESTINAL PEANUT (DISPOSABLE) IMPLANT
SPONGE LAP 18X18 X RAY DECT (DISPOSABLE) ×3 IMPLANT
SPONGE LAP 4X18 X RAY DECT (DISPOSABLE) ×9 IMPLANT
SUCKER WEIGHTED FLEX (MISCELLANEOUS) ×3 IMPLANT
SUT BONE WAX W31G (SUTURE) ×3 IMPLANT
SUT ETHIBOND 2 0 SH (SUTURE) ×7 IMPLANT
SUT ETHIBOND 2 0 SH 36X2 (SUTURE) ×2 IMPLANT
SUT ETHIBOND 2 0 V4 (SUTURE) IMPLANT
SUT ETHIBOND 2 0V4 GREEN (SUTURE) IMPLANT
SUT MNCRL AB 4-0 PS2 18 (SUTURE) IMPLANT
SUT PROLENE 3 0 SH 48 (SUTURE) ×12 IMPLANT
SUT PROLENE 3 0 SH DA (SUTURE) IMPLANT
SUT PROLENE 3 0 SH1 36 (SUTURE) ×6 IMPLANT
SUT PROLENE 4 0 RB 1 (SUTURE) ×2
SUT PROLENE 4 0 SH DA (SUTURE) IMPLANT
SUT PROLENE 4-0 RB1 .5 CRCL 36 (SUTURE) ×4 IMPLANT
SUT PROLENE 5 0 C 1 36 (SUTURE) IMPLANT
SUT PROLENE 6 0 C 1 30 (SUTURE) IMPLANT
SUT PROLENE 7 0 BV 1 (SUTURE) IMPLANT
SUT PROLENE 7 0 BV1 MDA (SUTURE) ×6 IMPLANT
SUT PROLENE 8 0 BV175 6 (SUTURE) IMPLANT
SUT SILK  1 MH (SUTURE) ×1
SUT SILK 1 MH (SUTURE) ×2 IMPLANT
SUT SILK 2 0 SH CR/8 (SUTURE) ×6 IMPLANT
SUT STEEL STERNAL CCS#1 18IN (SUTURE) IMPLANT
SUT STEEL SZ 6 DBL 3X14 BALL (SUTURE) IMPLANT
SUT VIC AB 1 CTX 36 (SUTURE) ×2
SUT VIC AB 1 CTX36XBRD ANBCTR (SUTURE) ×4 IMPLANT
SUT VIC AB 2-0 CT1 27 (SUTURE)
SUT VIC AB 2-0 CT1 TAPERPNT 27 (SUTURE) IMPLANT
SUT VIC AB 2-0 CTX 27 (SUTURE) IMPLANT
SUT VIC AB 3-0 SH 27 (SUTURE)
SUT VIC AB 3-0 SH 27X BRD (SUTURE) IMPLANT
SUT VIC AB 3-0 X1 27 (SUTURE) IMPLANT
SUT VICRYL 4-0 PS2 18IN ABS (SUTURE) IMPLANT
SUTURE E-PAK OPEN HEART (SUTURE) ×3 IMPLANT
SYSTEM SAHARA CHEST DRAIN ATS (WOUND CARE) ×6 IMPLANT
TAPE CLOTH SURG 4X10 WHT LF (GAUZE/BANDAGES/DRESSINGS) ×9 IMPLANT
TAPE PAPER 3X10 WHT MICROPORE (GAUZE/BANDAGES/DRESSINGS) ×3 IMPLANT
TOWEL GREEN STERILE (TOWEL DISPOSABLE) ×3 IMPLANT
TOWEL GREEN STERILE FF (TOWEL DISPOSABLE) ×3 IMPLANT
TOWEL OR 17X24 6PK STRL BLUE (TOWEL DISPOSABLE) IMPLANT
TOWEL OR 17X26 10 PK STRL BLUE (TOWEL DISPOSABLE) IMPLANT
TRAY FOLEY IC TEMP SENS 16FR (CATHETERS) ×3 IMPLANT
TUBING INSUFFLATION (TUBING) ×3 IMPLANT
UNDERPAD 30X30 (UNDERPADS AND DIAPERS) ×3 IMPLANT
VALVE MAGNA MITRAL 25MM (Prosthesis & Implant Heart) ×3 IMPLANT
VRC MALLEABLE SINGLE STG 34FR (MISCELLANEOUS) ×3
WATER STERILE IRR 1000ML POUR (IV SOLUTION) ×6 IMPLANT

## 2017-02-05 NOTE — Progress Notes (Signed)
1 Day Post-Op Procedure(s) (LRB): IABP Insertion (N/A) Right Heart Cath (N/A) Subjective:  Events of overnight noted. I saw her at 5:00 last night and she was doing fine sitting up eating dinner and then around 6:30 developed hypotension, N/V and became lethargic with ECG showing anteroseptal ST depression. There was already an emergency CABG going on in the OR and she was intubated and taken to the cath lab where IABP and Swan inserted. Left heart cath not done. She required escalating doses of levophed for cardiogenic shock and was started on dobutamine overnight. This am she is still in cardiogenic shock with CI 1.6 on levophed 14 mcg and dobut 10. PAP is 54/23, CVP 12. The pressures recorded overnight were probably falsely low due to transducer being too high. Co-ox 53 this am. CXR shows pulmonary vascular congestion.   Objective: Vital signs in last 24 hours: Temp:  [94.3 F (34.6 C)-99 F (37.2 C)] 98.8 F (37.1 C) (02/15 0800) Pulse Rate:  [80-163] 119 (02/15 0800) Cardiac Rhythm: Normal sinus rhythm (02/15 0732) Resp:  [17-81] 20 (02/15 0800) BP: (70-151)/(49-132) 117/79 (02/15 0800) SpO2:  [0 %-100 %] 99 % (02/15 0800) FiO2 (%):  [50 %-100 %] 50 % (02/15 0800) Weight:  [71.7 kg (158 lb)-77.6 kg (171 lb 1.2 oz)] 77.6 kg (171 lb 1.2 oz) (02/15 0500)  Hemodynamic parameters for last 24 hours: PAP: (28-53)/(12-25) 43/16 CVP:  [4 mmHg-51 mmHg] 6 mmHg CO:  [2.1 L/min-3.2 L/min] 3 L/min CI:  [1.2 L/min/m2-1.7 L/min/m2] 1.6 L/min/m2  Intake/Output from previous day: 02/14 0701 - 02/15 0700 In: 1217.7 [I.V.:1217.7] Out: 35 [Urine:35] Intake/Output this shift: Total I/O In: 272.1 [I.V.:272.1] Out: 0   General appearance: intubated and sedated Neurologic: intact Heart: regular rate and rhythm and 3/6 holosystolic murmur Lungs: clear to auscultation bilaterally Abdomen: soft, non-tender  Lab Results:  Recent Labs  02/04/17 0211 02/05/17 0515  WBC 8.6 12.5*  HGB 10.2*  9.7*  HCT 33.7* 31.8*  PLT 192 153   BMET:  Recent Labs  02/04/17 0211 02/05/17 0515  NA 141 142  K 3.6 3.4*  CL 106 114*  CO2 24 19*  GLUCOSE 141* 221*  BUN 22* 28*  CREATININE 1.15* 2.13*  CALCIUM 9.2 7.9*    PT/INR:  Recent Labs  02/03/17 1118  LABPROT 14.4  INR 1.11   ABG    Component Value Date/Time   PHART 7.459 (H) 02/05/2017 0522   HCO3 17.0 (L) 02/05/2017 0522   TCO2 18 02/05/2017 0522   ACIDBASEDEF 6.0 (H) 02/05/2017 0522   O2SAT 98.0 02/05/2017 0522   CBG (last 3)   Recent Labs  02/04/17 2342 02/05/17 0346 02/05/17 0740  GLUCAP 236* 203* 118*    Assessment/Plan:  Severe multi-vessel coronary disease and severe central MR. Decompensation last night that was very sudden with anteroseptal ST depression with persistent cardiogenic shock despite IABP, high dose vasopressors, anuric renal failure, shock liver dysfunction. She has a much different murmur than she had last night when I listened to her and it is now holosystolic. She should have an echo to reassess things, rule out VSD, flail mitral valve. She is not a candidate for CABG, MV repair in her current condition. Heart failure team is consulting this am. Will follow.   LOS: 2 days    Gaye Pollack 02/05/2017

## 2017-02-05 NOTE — Progress Notes (Signed)
Initial Nutrition Assessment  DOCUMENTATION CODES:   Not applicable  INTERVENTION:    If TF warranted, rec Vital AF 1.2 at goal rate of 50 ml/h (1200 ml per day) and Prostat 30 ml BID   TF regimen to provide 1640 kcals, 120 gm protein, 973 ml free water daily  NUTRITION DIAGNOSIS:   Inadequate oral intake related to inability to eat as evidenced by NPO status  GOAL:   Patient will meet greater than or equal to 90% of their needs  MONITOR:   Vent status, Labs, Weight trends, I & O's  REASON FOR ASSESSMENT:   Ventilator  ASSESSMENT:   82 yo [place] with HTN and DM found to have significant three-vessel CAD in the setting of new chest pain with minimal exertion over the last 3 days consistent with unstable angina.   Pt s/p procedures 2/15: CABG, MITRAL VALVE REPAIR  Patient is currently intubated on ventilator support >> OGT in place MV: 9.8 L/min Temp (24hrs), Avg:97.8 F (36.6 C), Min:94.3 F (34.6 C), Max:99.5 F (37.5 C)  Pt currently in ECHO LAB. Per Malnutrition Screening Tool, pt was eating poorly because of a decreased appetite. RD unable to complete Nutrition Focused Physical Exam at this time. Labs and medications reviewed. CBG's [phone].  Diet Order:  Diet NPO time specified  Skin:  Reviewed, no issues  Last BM:  2/12  Height:   Ht Readings from Last 1 Encounters:  02/05/17 5\' 6"  (1.676 m)    Weight:   Wt Readings from Last 1 Encounters:  02/05/17 171 lb 1.2 oz (77.6 kg)    Ideal Body Weight:  59 kg  BMI:  Body mass index is 27.61 kg/m.  Estimated Nutritional Needs:   Kcal:  2500  Protein:  115-125 gm  Fluid:  per MD  EDUCATION NEEDS:   No education needs identified at this time  Arthur Holms, RD, LDN Pager #: [phone] After-Hours Pager #: [phone]

## 2017-02-05 NOTE — Progress Notes (Signed)
PULMONARY / CRITICAL CARE MEDICINE   Name: [name] [name] MRN: [id number] DOB: 04-23-1935    ADMISSION DATE:  02/03/2017 CONSULTATION DATE:  02/04/2017  REFERRING MD:  Dr. Martinique  CHIEF COMPLAINT:  AMS  HISTORY OF PRESENT ILLNESS:  82 year old female with past medical history as below, which is significant for CAD, diabetes mellitus, and hypertension. She presented to cardiologist to/13 with complaints of chest pain and shortness of breath while doing light housework onset about 3 days prior to arrival. She had an EKG done in the clinic, which was concerning for ischemia. She was referred for urgent cardiac cath which revealed three-vessel CAD. She was admitted in transfer to [facility] for urgent CABG which was scheduled for 2/15. After arrival to [facility] on 2/14 she was noted to be lethargic and hypotensive after doing well all day. She was in cardiogenic shock secondary to coronary artery disease requiring intubation, vasopressors, and emergent blood pressure support with intra-aortic balloon pump. The procedure was without complication, and she was sent to the ICU for recovery. PCCM to assist with ventilator and medical management.   SUBJECTIVE:  Intubated, sedated, IABP in.  Arousable Dropping urine output BP low on pressors   VITAL SIGNS: BP 112/75   Pulse (!) 101   Temp 99 F (37.2 C)   Resp 20   Ht 5\' 6"  (1.676 m)   Wt 77.6 kg (171 lb 1.2 oz)   SpO2 99%   BMI 27.61 kg/m   HEMODYNAMICS: PAP: (28-55)/(12-25) 55/24 CVP:  [4 mmHg-51 mmHg] 13 mmHg CO:  [2.1 L/min-3.2 L/min] 3 L/min CI:  [1.2 L/min/m2-1.7 L/min/m2] 1.6 L/min/m2  VENTILATOR SETTINGS: Vent Mode: PRVC FiO2 (%):  [50 %-100 %] 50 % Set Rate:  [20 bmp] 20 bmp Vt Set:  [500 mL] 500 mL PEEP:  [5 cmH20-8 cmH20] 8 cmH20 Plateau Pressure:  [18 cmH20-24 cmH20] 24 cmH20  INTAKE / OUTPUT: I/O last 3 completed shifts: In: 1217.7 [I.V.:1217.7] Out: 35 [Urine:35]  PHYSICAL EXAMINATION: General:   Elderly female in no acute distress on ventilator. Intubated. Sedated. Neuro:  Sedated.  CN grossly intact. (-) lateralizing signs.  HEENT:  Normocephalic/atraumatic, PERRL, mild JVD Cardiovascular:  RRR, no MRG Lungs:  Good ae. Crackles at bases. (-) wheezing/rhonchi Abdomen:  Soft, nondistended. (-) masses/tenderness. Dec BS Musculoskeletal:  No acute deformity Skin:  Grossly intact. Cool distal extremities.   LABS:  BMET  Recent Labs Lab 02/03/17 1118 02/04/17 0211 02/05/17 0515  NA 142 141 142  K 3.6 3.6 3.4*  CL 108 106 114*  CO2 26 24 19*  BUN 29* 22* 28*  CREATININE 1.27* 1.15* 2.13*  GLUCOSE 150* 141* 221*    Electrolytes  Recent Labs Lab 02/03/17 1118 02/03/17 1839 02/04/17 0211 02/05/17 0515  CALCIUM 9.5  --  9.2 7.9*  MG  --  2.1  --   --     CBC  Recent Labs Lab 02/03/17 1118 02/04/17 0211 02/05/17 0515  WBC 7.8 8.6 12.5*  HGB 11.4* 10.2* 9.7*  HCT 36.8 33.7* 31.8*  PLT 219 192 153    Coag's  Recent Labs Lab 02/03/17 1118 02/05/17 0240  APTT  --  61*  INR 1.11  --     Sepsis Markers No results for input(s): LATICACIDVEN, PROCALCITON, O2SATVEN in the last 168 hours.  ABG  Recent Labs Lab 02/04/17 2204 02/05/17 0522  PHART 7.443 7.459*  PCO2ART 23.6* 23.9*  PO2ART 58.0* 104.0    Liver Enzymes  Recent Labs Lab 02/04/17 0211  02/05/17 0515  AST 72* 527*  ALT 24 313*  ALKPHOS 63 91  BILITOT 1.2 1.5*  ALBUMIN 3.5 2.7*    Cardiac Enzymes  Recent Labs Lab 02/03/17 1118  TROPONINI 6.48*    Glucose  Recent Labs Lab 02/04/17 0621 02/04/17 1121 02/04/17 1801 02/04/17 2342 02/05/17 0346 02/05/17 0740  GLUCAP 139* 135* 224* 236* 203* 118*    Imaging Dg Chest Port 1 View  Result Date: 02/05/2017 CLINICAL DATA:  Hypoxia EXAM: PORTABLE CHEST 1 VIEW COMPARISON:  February 04, 2017 FINDINGS: Endotracheal tube tip is 1.8 cm above the carina. Central catheter placed from a femoral approach has its tip in the distal  right main pulmonary artery. Nasogastric tube tip and side port are below the diaphragm. No pneumothorax. There is perihilar interstitial edema. There is patchy atelectasis in mid lower lung zones. There is cardiomegaly with pulmonary venous hypertension. IMPRESSION: Tube and catheter positions as described without pneumothorax. Evidence a degree of congestive heart failure. No airspace consolidation. Electronically Signed   By: Lowella Grip III M.D.   On: 02/05/2017 07:36   Dg Chest Port 1 View  Result Date: 02/04/2017 CLINICAL DATA:  Respiratory failure. History of diabetes, coronary artery disease, hypertension, left heart catheterization. Nonsmoker. EXAM: PORTABLE CHEST 1 VIEW COMPARISON:  02/03/2017 FINDINGS: Interval placement of an endotracheal tube with tip measuring 3.3 cm above the carina. An inferior approach Swan-Ganz catheter is present with tip over the right pulmonary artery. Shallow inspiration. Cardiac enlargement. Mild diffuse pulmonary vascular congestion. Mild interstitial pattern to the lung bases suggest edema. No definite consolidation. No pneumothorax. IMPRESSION: Appliances appear in satisfactory location. Shallow inspiration. Cardiac enlargement with vascular congestion. Mild edema. Electronically Signed   By: Lucienne Capers M.D.   On: 02/04/2017 22:34   Dg Abd Portable 1v  Result Date: 02/05/2017 CLINICAL DATA:  OG tube placement EXAM: PORTABLE ABDOMEN - 1 VIEW COMPARISON:  None. FINDINGS: Enteric tube is demonstrated with tip in the mid upper abdomen consistent with location in the body of the stomach. Paucity of gas in the abdomen. Surgical clips in the right lower quadrant. No radiopaque stones. Degenerative changes in the spine. IMPRESSION: Enteric tube tip is in the upper mid abdomen consistent with location in the body of the stomach. Electronically Signed   By: Lucienne Capers M.D.   On: 02/05/2017 00:25     STUDIES:  LHC 2/13 > significant three-vessel coronary  artery disease, including sequential 70% 80% proximal and mid LAD stenoses, mid left circumflex lesion, and chronic subtotal occlusion of the mid RCA. Echo 2/13 > LVEF 40-45%, hypokinesis of the anterior myocardium, akinesis of the anterolateral and inferior lateral myocardium. Grade 2 diastolic dysfunction.  CULTURES: MRSA 2/14 > (-) Trache asp 2/14 >   ANTIBIOTICS: Cefuroxime 2/14 > 2/14 Vanc 2/14 >   SIGNIFICANT EVENTS: 2/14 admit for urgent CABG but pt in cardiogenic shock. Intubated.    LINES/TUBES: L fem art sheath 2/14 > L fem Swan 2/14 >  DISCUSSION: 20F, with severe 3VD, in cardiogenic shock.  Initial plan was for CABG but it is on hold as she is too sick for CABG. Intubated. With IABP. On pressors.   ASSESSMENT / PLAN:  PULMONARY A: Acute hypoxemic respiratory failure secondary to acute systolic heart failure exacerbation/pulmonary edema + CAD/ischemia  P:   Full vent support. Adjust vent 2/2 alkalemia.  Not ready for weaning. Check abg/CXR.   Vap prevention bundle Will defer to cards re: diuresis   CARDIOVASCULAR A:  Cardiogenic shock  secondary to acute heart failure/CAD/Ischemia Three-vessel CAD, severe P:  Cont vent support Cardiology is primary. Will defer drips to cards. Currently on Dopamine and levophed drip. Suggest to increase levophed drip.  Telemetry monitoring MAP goal greater than 65 mmHg CABG on hold.  Did not tolerate dopamine well (SVT) Cont IABP Cont heparin drip.  Defer diuresis to cards.  Trand lactic acid  RENAL A:   Acute kidney injury  P:   Follow BMP Strict I&O Defer diuretics to cards. May end up needing CVVH.    GASTROINTESTINAL A:   No acute issues  P:   Nothing by mouth Protonix IV for stress ulcer prophylaxis  HEMATOLOGIC A:   Anemia, uncertain chronicity P:  Follow CBC Heparin infusion   INFECTIOUS A:   No acute issues P:   Follow WBC and fever curve  On cefuroxime and vanc perioperatively.  (-)  evidence for infection for now. Observe.   ENDOCRINE A:   Diabetes mellitus P:   CBG monitoring  Insulin infusion per primary  NEUROLOGIC A:   Acute metabolic encephalopathy in the setting of shock P:   RASS goal: -1 to -2 Fentanyl drip  and Versed pushes.  She appears comfortable.    FAMILY  - Updates: Family updated by cardiology on 2/14.  No family at bedside on 2/15.   - Inter-disciplinary family meet or Palliative Care meeting due by:  2/20    I spent  30  minutes of Critical Care time with this patient today.    Monica Becton, MD 02/05/2017, 8:57 AM [facility] Pager [phone] After 3 pm or if no answer, call [phone]

## 2017-02-05 NOTE — Progress Notes (Signed)
  Echocardiogram Echocardiogram Transesophageal has been performed.  Diamond Nickel 02/05/2017, 10:49 AM

## 2017-02-05 NOTE — Progress Notes (Signed)
RT note: Sputum sample obtained and sent down to main lab without complications.

## 2017-02-05 NOTE — Progress Notes (Signed)
Advanced Heart Failure Rounding Note   Subjective:    82 y/o woman with HTN, DM and glaucoma admitted 2/13 from [facility] with NSTEMI. Cath with severe 3v CAD with TIMI-2 flow in large OM territory and chronically occluded RCA  Echo 40-45% with severe central MR. RV ok   CABG was scheduled for today but last night developed hypotension and shock with multi-sytem organ failure. Intubated and taken to cath lab for IABP placement. Dr. Prescott Gum has seen the patient. CABG deferred and request HF team input.   Co-ox last night 39% -> 53% this am on dobutamine 10 and norepi 14. SBP 90s. Sedated but awakens to stimuli  Swan numbers  RA  5 PA  46/16 CO/CI  3.0/1.6   Objective:   Weight Range:  Vital Signs:   Temp:  [94.3 F (34.6 C)-99 F (37.2 C)] 99 F (37.2 C) (02/15 0745) Pulse Rate:  [80-163] 146 (02/15 0745) Resp:  [17-81] 20 (02/15 0745) BP: (70-151)/(49-132) 95/57 (02/15 0745) SpO2:  [0 %-100 %] 98 % (02/15 0745) FiO2 (%):  [50 %-100 %] 50 % (02/15 0430) Weight:  [71.7 kg (158 lb)-77.6 kg (171 lb 1.2 oz)] 77.6 kg (171 lb 1.2 oz) (02/15 0500) Last BM Date: 02/02/17  Weight change: Filed Weights   02/04/17 1241 02/05/17 0500  Weight: 71.7 kg (158 lb) 77.6 kg (171 lb 1.2 oz)    Intake/Output:   Intake/Output Summary (Last 24 hours) at 02/05/17 0751 Last data filed at 02/05/17 0600  Gross per 24 hour  Intake          1217.66 ml  Output               35 ml  Net          1182.66 ml     Physical Exam: General:  Intubated sedated. Awakens to stimul HEENT: normal Neck: supple. RIJ Carotids 2+ bilat; no bruits. No lymphadenopathy or thryomegaly appreciated. Cor: PMI nondisplaced. Regular rate & rhythm. Loud holosystolic murmur Lungs: clear Abdomen: soft, nontender, nondistended. No hepatosplenomegaly. No bruits or masses. Good bowel sounds. Extremities: no cyanosis, clubbing, rash, edema. R groin IABP and swan in place Neuro: intubated/sedated, cranial nerves  grossly intact. moves all 4 extremities w/o difficulty. Affect pleasant  Telemetry: Sinus tach 110  Labs: Basic Metabolic Panel:  Recent Labs Lab 02/03/17 1118 02/03/17 1839 02/04/17 0211 02/05/17 0515  NA 142  --  141 142  K 3.6  --  3.6 3.4*  CL 108  --  106 114*  CO2 26  --  24 19*  GLUCOSE 150*  --  141* 221*  BUN 29*  --  22* 28*  CREATININE 1.27*  --  1.15* 2.13*  CALCIUM 9.5  --  9.2 7.9*  MG  --  2.1  --   --     Liver Function Tests:  Recent Labs Lab 02/04/17 0211 02/05/17 0515  AST 72* 527*  ALT 24 313*  ALKPHOS 63 91  BILITOT 1.2 1.5*  PROT 5.8* 4.7*  ALBUMIN 3.5 2.7*   No results for input(s): LIPASE, AMYLASE in the last 168 hours. No results for input(s): AMMONIA in the last 168 hours.  CBC:  Recent Labs Lab 02/03/17 1118 02/04/17 0211 02/05/17 0515  WBC 7.8 8.6 12.5*  NEUTROABS 4.9 4.5  --   HGB 11.4* 10.2* 9.7*  HCT 36.8 33.7* 31.8*  MCV 72.6* 74.4* 74.3*  PLT 219 192 153    Cardiac Enzymes:  Recent Labs  Lab 02/03/17 1118  TROPONINI 6.48*    BNP: BNP (last 3 results) No results for input(s): BNP in the last 8760 hours.  ProBNP (last 3 results) No results for input(s): PROBNP in the last 8760 hours.    Other results:  Imaging: Dg Chest 2 View  Result Date: 02/03/2017 CLINICAL DATA:  Unstable angina with symptoms beginning on February 7th. Nonsmoker. EXAM: CHEST  2 VIEW COMPARISON:  None in PACs FINDINGS: The lungs are well-expanded. The interstitial markings are coarse. There is no alveolar infiltrate or pleural effusion. The cardiac silhouette is mildly enlarged. The pulmonary vascularity is mildly engorged. There is tortuosity of the ascending and descending thoracic aorta with mural calcification. There is mild multilevel degenerative disc disease of the thoracic spine. IMPRESSION: Cardiomegaly with mild pulmonary interstitial prominence suggests low-grade CHF. Alternatively chronic bronchitic-fibrotic changes could produce  similar appearance of the pulmonary interstitium. There is no acute pneumonia nor pleural effusion. Thoracic aortic atherosclerosis. Electronically Signed   By: David  Martinique M.D.   On: 02/03/2017 10:56   Dg Chest Port 1 View  Result Date: 02/05/2017 CLINICAL DATA:  Hypoxia EXAM: PORTABLE CHEST 1 VIEW COMPARISON:  February 04, 2017 FINDINGS: Endotracheal tube tip is 1.8 cm above the carina. Central catheter placed from a femoral approach has its tip in the distal right main pulmonary artery. Nasogastric tube tip and side port are below the diaphragm. No pneumothorax. There is perihilar interstitial edema. There is patchy atelectasis in mid lower lung zones. There is cardiomegaly with pulmonary venous hypertension. IMPRESSION: Tube and catheter positions as described without pneumothorax. Evidence a degree of congestive heart failure. No airspace consolidation. Electronically Signed   By: Lowella Grip III M.D.   On: 02/05/2017 07:36   Dg Chest Port 1 View  Result Date: 02/04/2017 CLINICAL DATA:  Respiratory failure. History of diabetes, coronary artery disease, hypertension, left heart catheterization. Nonsmoker. EXAM: PORTABLE CHEST 1 VIEW COMPARISON:  02/03/2017 FINDINGS: Interval placement of an endotracheal tube with tip measuring 3.3 cm above the carina. An inferior approach Swan-Ganz catheter is present with tip over the right pulmonary artery. Shallow inspiration. Cardiac enlargement. Mild diffuse pulmonary vascular congestion. Mild interstitial pattern to the lung bases suggest edema. No definite consolidation. No pneumothorax. IMPRESSION: Appliances appear in satisfactory location. Shallow inspiration. Cardiac enlargement with vascular congestion. Mild edema. Electronically Signed   By: Lucienne Capers M.D.   On: 02/04/2017 22:34   Dg Abd Portable 1v  Result Date: 02/05/2017 CLINICAL DATA:  OG tube placement EXAM: PORTABLE ABDOMEN - 1 VIEW COMPARISON:  None. FINDINGS: Enteric tube is  demonstrated with tip in the mid upper abdomen consistent with location in the body of the stomach. Paucity of gas in the abdomen. Surgical clips in the right lower quadrant. No radiopaque stones. Degenerative changes in the spine. IMPRESSION: Enteric tube tip is in the upper mid abdomen consistent with location in the body of the stomach. Electronically Signed   By: Lucienne Capers M.D.   On: 02/05/2017 00:25      Medications:     Scheduled Medications: . aspirin EC  81 mg Oral Daily  . atorvastatin  80 mg Oral q1800  . calcium-vitamin D  1 tablet Oral Q breakfast  . cefUROXime (ZINACEF)  IV  1.5 g Intravenous To OR  . cefUROXime (ZINACEF)  IV  750 mg Intravenous To OR  . chlorhexidine gluconate (MEDLINE KIT)  15 mL Mouth Rinse BID  . dexmedetomidine  0.1-0.7 mcg/kg/hr Intravenous To OR  . diazepam  2 mg Oral Once  . DOPamine  0-10 mcg/kg/min Intravenous To OR  . epinephrine  0-10 mcg/min Intravenous To OR  . feeding supplement (ENSURE ENLIVE)  237 mL Oral BID BM  . heparin 30,000 units/NS 1000 mL solution for CELLSAVER   Other To OR  . hydrochlorothiazide  12.5 mg Oral Daily  . insulin aspart  0-15 Units Subcutaneous Q4H  . insulin (NOVOLIN-R) infusion   Intravenous To OR  . irbesartan  300 mg Oral Daily  . latanoprost  1 drop Both Eyes QHS  . linagliptin  5 mg Oral Daily  . magnesium sulfate  40 mEq Other To OR  . mouth rinse  15 mL Mouth Rinse QID  . metoprolol tartrate  12.5 mg Oral Once  . nitroGLYCERIN  2-200 mcg/min Intravenous To OR  . pantoprazole (PROTONIX) IV  40 mg Intravenous Q24H  . phenylephrine 14m/250mL NS (0.066mml) infusion  30-200 mcg/min Intravenous To OR  . potassium chloride  80 mEq Other To OR  . sodium chloride flush  10-40 mL Intracatheter Q12H  . sodium chloride flush  3 mL Intravenous Q12H  . tranexamic acid (CYKLOKAPRON) infusion ([facility])  1.5 mg/kg/hr Intravenous To OR  . tranexamic acid  15 mg/kg Intravenous To OR  . tranexamic acid  2 mg/kg  Intracatheter To OR  . vancomycin  1,250 mg Intravenous To OR     Infusions: . sodium chloride 75 mL/hr at 02/05/17 0600  . DOBUTamine 10 mcg/kg/min (02/05/17 0627)  . fentaNYL infusion INTRAVENOUS 300 mcg/hr (02/05/17 0715)  . heparin 800 Units/hr (02/05/17 0600)  . midazolam (VERSED) infusion Stopped (02/05/17 0026)  . norepinephrine (LEVOPHED) Adult infusion 14 mcg/min (02/05/17 0618)     PRN Medications:  sodium chloride, acetaminophen, midazolam, nitroGLYCERIN, ondansetron (ZOFRAN) IV, pneumococcal 23 valent vaccine, sodium chloride flush, sodium chloride flush, temazepam  ECG: NSR 98 with LVH and severe anterior ST depression.   Assessment:   1. Cardiogenic shock    --echo 02/04/14. EF 40-45% severe MR 2. Acute respiratory failure 3. NSTEMI/CAD    --cath 2/13: Significant three-vessel coronary artery disease, including sequential 70% and 80% proximal and mid LAD stenoses, 99% mid LCx lesion with TIMI-2 flow, and chronic subtotal occlusion of the mid RCA.  4. Severe mitral regurgitation 5. DM2 6. HTN  7. AKI  8. Hypokalemia 9. Transaminitis/shock liver       Plan/Discussion:    She has profound cardiogenic shock in setting of acute MI and severe MR. Based on echo MR looks central and not clearly ischemic. There is akinesis of inferior and inferolateral walls.   BP improved with IABP and dual pressors but with progressive multi-system organ failure and evidence of ongoing hypoperfusion. Will titrate norepi to 30 in an attempt to get co-ox > 60%. Repeat co-ox in 1 hour. Check lactate. Will need arterial line.   Will proceed with bedside TEE to better evaluate MR and look for other mechanical complications. I suspect she will get worse before she gets better.   D/w Dr. BaCyndia Bentt the bedside.   The patient is critically ill with multiple organ systems failure and requires high complexity decision making for assessment and support, frequent evaluation and titration of  therapies, application of advanced monitoring technologies and extensive interpretation of multiple databases.   Critical Care Time devoted to patient care services described in this note is 45 Minutes   Length of Stay: 2   BeGlori BickersD 02/05/2017, 7:51 AM  Advanced Heart Failure Team Pager [phone]  M-F; 7a - 4p)  Please contact [facility] for night-coverage after hours (4p -7a ) and weekends on [email or web address]

## 2017-02-05 NOTE — Progress Notes (Signed)
Received patient from OR, BP labile, requiring titration of pressors and albumin infusions. See flowsheets for more details on intake and output on arrival to SICU.

## 2017-02-05 NOTE — Procedures (Signed)
Arterial Catheter Insertion Procedure Note [name] [name] [id number] 1935-09-08  Procedure: Insertion of Arterial Catheter  Indications: Blood pressure monitoring and Frequent blood sampling  Procedure Details Consent: Risks of procedure as well as the alternatives and risks of each were explained to the (patient/caregiver).  Consent for procedure obtained. Time Out: Verified patient identification, verified procedure, site/side was marked, verified correct patient position, special equipment/implants available, medications/allergies/relevent history reviewed, required imaging and test results available.  Performed  Maximum sterile technique was used including antiseptics, cap, gloves, gown, hand hygiene, mask and sheet. Skin prep: Chlorhexidine; local anesthetic administered 20 gauge catheter was inserted into left radial artery using the Seldinger technique.  Evaluation Blood flow good; BP tracing good. Complications: No apparent complications.   Philomena Doheny 02/05/2017

## 2017-02-05 NOTE — Anesthesia Preprocedure Evaluation (Signed)
Anesthesia Evaluation    Reviewed: Patient's Chart, lab work & pertinent test results, Unable to perform ROS - Chart review only  Airway Mallampati: II       Dental  (+) Edentulous Upper, Edentulous Lower   Pulmonary    + rhonchi  + decreased breath sounds      Cardiovascular hypertension,  Rhythm:Regular Rate:Tachycardia     Neuro/Psych    GI/Hepatic   Endo/Other  diabetes  Renal/GU      Musculoskeletal   Abdominal   Peds  Hematology   Anesthesia Other Findings   Reproductive/Obstetrics                             Anesthesia Physical Anesthesia Plan  ASA: IV and emergent  Anesthesia Plan: General   Post-op Pain Management:    Induction: Intravenous  Airway Management Planned: Oral ETT  Additional Equipment:   Intra-op Plan:   Post-operative Plan:   Informed Consent:   Only emergency history available  Plan Discussed with: Anesthesiologist and CRNA  Anesthesia Plan Comments:         Anesthesia Quick Evaluation

## 2017-02-05 NOTE — Anesthesia Postprocedure Evaluation (Addendum)
Anesthesia Post Note  Patient: [name] [name]  Procedure(s) Performed: Procedure(s) (LRB): IABP Insertion (N/A) Right Heart Cath (N/A)  Patient location during evaluation: Nursing Unit Anesthesia Type: General Level of consciousness: sedated and patient remains intubated per anesthesia plan Vital Signs Assessment: vitals unstable Respiratory status: patient on ventilator - see flowsheet for VS and patient remains intubated per anesthesia plan Cardiovascular status: tachycardic and unstable Anesthetic complications: no       Last Vitals:  Vitals:   02/05/17 0730 02/05/17 0745  BP: 92/70 (!) 95/57  Pulse: (!) 112 (!) 146  Resp: 20 20  Temp: 37.1 C 37.2 C    Last Pain:  Vitals:   02/05/17 0400  TempSrc: Core (Comment)  PainSc:                  [name] [name]

## 2017-02-05 NOTE — OR Nursing (Signed)
1709 First call made to SICU.

## 2017-02-05 NOTE — OR Nursing (Signed)
1827 Rolling call made to SICU.

## 2017-02-05 NOTE — Anesthesia Preprocedure Evaluation (Signed)
Anesthesia Evaluation  Patient identified by MRN, date of birth, ID band Patient unresponsive    Reviewed: Unable to perform ROS - Chart review onlyPreop documentation limited or incomplete due to emergent nature of procedure.  Airway Mallampati: Intubated       Dental   Pulmonary    Pulmonary exam normal        Cardiovascular hypertension, + CAD  Normal cardiovascular exam+ Valvular Problems/Murmurs MR      Neuro/Psych    GI/Hepatic   Endo/Other  diabetes  Renal/GU      Musculoskeletal   Abdominal   Peds  Hematology   Anesthesia Other Findings   Reproductive/Obstetrics                             Anesthesia Physical Anesthesia Plan  ASA: IV  Anesthesia Plan: General   Post-op Pain Management:    Induction: Intravenous  Airway Management Planned: Oral ETT  Additional Equipment: Arterial line, CVP, PA Cath and Ultrasound Guidance Line Placement  Intra-op Plan:   Post-operative Plan: Post-operative intubation/ventilation  Informed Consent: I have reviewed the patients History and Physical, chart, labs and discussed the procedure including the risks, benefits and alternatives for the proposed anesthesia with the patient or authorized representative who has indicated his/her understanding and acceptance.     Plan Discussed with: CRNA and Surgeon  Anesthesia Plan Comments:         Anesthesia Quick Evaluation

## 2017-02-05 NOTE — Progress Notes (Signed)
ANTICOAGULATION CONSULT NOTE - Follow Up Consult  Pharmacy Consult for heparin Indication: IABP  Labs:  Recent Labs  02/03/17 1118 02/04/17 0211 02/04/17 1132 02/05/17 0240 02/05/17 0515  HGB 11.4* 10.2*  --   --  9.7*  HCT 36.8 33.7*  --   --  31.8*  PLT 219 192  --   --  153  APTT  --   --   --  61*  --   LABPROT 14.4  --   --   --   --   INR 1.11  --   --   --   --   HEPARINUNFRC  --  0.21* 0.32  --  0.23*  CREATININE 1.27* 1.15*  --   --   --   TROPONINI 6.48*  --   --   --   --     Assessment/Plan:  82yo female therapeutic on heparin with initial dosing after IABP placed.  CTCS states pt is not currently CABG candidate. Will continue gtt at current rate and confirm stable with additional level.   Wynona Neat, PharmD, BCPS  02/05/2017,6:00 AM

## 2017-02-05 NOTE — Progress Notes (Signed)
   Progress Note  Patient Name: [name] [name] Date of Encounter: 02/05/2017  Primary Cardiologist: Dr. Yvone Neu  Subjective   Echo yesterday showing severe MR with moderate reduced EF. She acutely decompensated overnight with cardiogenic shock and multiple organ system dysfunction. She was was intubated and sedated with IABP and vasopressors initiated. 90s SBP this morning on art line. TEE this morning demonstrated severe MR w/ partial flail mitral valve.  Vital Signs    Vitals:   02/05/17 0900 02/05/17 0915 02/05/17 0930 02/05/17 0945  BP: (!) 124/101 (!) 118/93 121/68 110/62  Pulse: (!) 113 (!) 151 (!) 208 (!) 155  Resp: 20 16 16 16   Temp: 99.1 F (37.3 C) 99.1 F (37.3 C) 99.1 F (37.3 C) 99.3 F (37.4 C)  TempSrc:      SpO2: 96% 97% 97% 98%  Weight:      Height:        Filed Weights   02/04/17 1241 02/05/17 0500  Weight: 158 lb (71.7 kg) 171 lb 1.2 oz (77.6 kg)    Physical Exam   General: Intubated, sedated Neck: Supple without bruits, no LAN Lungs: Ventilator assisted breaths, no crackles/wheezes Heart: RRR, loud holosystolic murmur audible over left lower sternal border and apex Extremities: No clubbing, cyanosis, no  Pitting edema. R groin IABP in place Neuro: Sedated, Moving extremities to stimuli, not following directions  Patient Profile     82 y.o. female with HTN and DM found to have significant three-vessel CAD in the setting of new unstable angina admitted with NSTEMI with plans for CABG and MV repair.  Assessment & Plan    Cardiogenic shock w/ severe MR: Acutely worsened MR overnight with partial posterior mitral valve flail seen on TEE today suspected 2/2 papillary rupture from her NSTEMI. Now s/p IABP on levophed, dobutamine/milrinine. LA not too high but multiple organ system biomarkers worsened.   -Now with advanced heart failure team for management and CVTS planning urgent MV repair today. CAD/NSTEMI: 70%-80% proximal and mid LAD stenoses, 99%  mid LCx lesion, and chronic subtotal occlusion mid RCA. Trop 6.8 w/ EKG depressions. -For CABG as above.  Hypoxic respiratory failure: Intubated and sedated now w/ PCCM managing AKI, LFT elevation: 2/2 cardiogenic shock  Pulmonary and critical care, advanced heart failure team, CVTS on board to assume care. We appreciate their assistance on this critically ill, unstable patient.    Collier Salina, MD PGY-II Internal Medicine Resident Pager# [phone] 02/05/2017, 10:16 AM   Patient seen this am Agree with Dr Cyndia Bent that murmur much louder TEE by DB with flail leaflet PMR. Given diagnosis patient to OR Despite overall clinical condition. She had TIMI 2 flow to circumflex And suspect this closed or caused infarct to papillary muscle.    Jenkins Rouge

## 2017-02-05 NOTE — Anesthesia Procedure Notes (Signed)
Date/Time: 02/05/2017 10:37 AM Performed by: Lance Coon Pre-anesthesia Checklist: Patient identified, Emergency Drugs available, Suction available, Patient being monitored and Timeout performed Patient Re-evaluated:Patient Re-evaluated prior to inductionOxygen Delivery Method: Circle system utilized Preoxygenation: Pre-oxygenation with 100% oxygen Intubation Type: Inhalational induction Placement Confirmation: positive ETCO2 and breath sounds checked- equal and bilateral Comments: 7.5 subglottic suction tube secured at 20cm indwelling from ICU.

## 2017-02-05 NOTE — Procedures (Signed)
     TRANSESOPHAGEAL ECHOCARDIOGRAM   NAME:  [name] [name]   MRN: [id number] DOB:  [birth year]   ADMIT DATE: 02/03/2017  INDICATIONS: Cardiogenic shock. Severe MR  PROCEDURE:   Emergent consent assumed. The patient was intubated and sedated and in cardiogenic shock.  After a procedural time-out, the patient was given 4 mg versed and 50 mcg fentanyl. The transesophageal probe was inserted in the esophagus and stomach with mild difficulty due to presence of ETT and NGT.    COMPLICATIONS:    There were no immediate complications.  FINDINGS:  LEFT VENTRICLE: EF = 45% (on dual inotropes). Lateral and inferolateral AK  RIGHT VENTRICLE: Normal size and function.   LEFT ATRIUM: Dilated  LEFT ATRIAL APPENDAGE: Not well visualized  RIGHT ATRIUM: Normal. + Swan   AORTIC VALVE:  Trileaflet. Mild to moderately calcified. No significant AI or AS.    MITRAL VALVE:  Partial flail of MV with severe eccentric MR directed anteriorly.   TRICUSPID VALVE: Normal. Mild to moderate TR  PULMONIC VALVE: Grossly normal. Trivial PI  INTERATRIAL SEPTUM: No PFO or ASD.  PERICARDIUM: Small effusion along RV.   DESCENDING AORTA: Not well visualized.    CONCLUSION:  Patient with severe MR due to partial flail of MV. Reviewed with Dr. Cyndia Bent at bedside.    Bensimhon, Daniel,MD 9:29 AM

## 2017-02-05 NOTE — Anesthesia Procedure Notes (Addendum)
Central Venous Catheter Insertion Performed by: Lillia Abed, anesthesiologist Start/End2/15/2018 11:00 AM, 02/05/2017 11:10 AM Patient location: OR. Position: Trendelenburg Patient sedated Hand hygiene performed , maximum sterile barriers used  and Seldinger technique used Central line was placed.MAC introducer Swan type:thermodilution Procedure performed using ultrasound guided technique. Ultrasound Notes:anatomy identified, needle tip was noted to be adjacent to the nerve/plexus identified, no ultrasound evidence of intravascular and/or intraneural injection and image(s) printed for medical record Attempts: 1 Following insertion, line sutured. Post procedure assessment: blood return through all ports, free fluid flow and no air  Patient tolerated the procedure well with no immediate complications.

## 2017-02-05 NOTE — Progress Notes (Signed)
Cardiology Fellow Dr. Kenton Kingfisher updated on patient. CVP dropped from 10 to 3, Augmentation on IABP dropped from 100s to 70s, BP Mean dropped from 70 to low 50s on 17 of levophed, and UOP has been low with 25cc since admit at 2130 on 2/14. Order given to start low dose dobutamine. Will continue to monitor.

## 2017-02-05 NOTE — OR Nursing (Signed)
1750 Second call made to SICU.

## 2017-02-05 NOTE — Anesthesia Postprocedure Evaluation (Signed)
Anesthesia Post Note  Patient: [name] [name]  Procedure(s) Performed: Procedure(s) (LRB): CORONARY ARTERY BYPASS GRAFTING (CABG) x 4                                                                               LIMA-LAD SEQ SVG-DIAG-OM SVG-PD (N/A) TRANSESOPHAGEAL ECHOCARDIOGRAM (TEE) (N/A) MITRAL VALVE REPLACEMENT (MVR) WITH MAGNA MITRAL EASE PERICARDIAL BIOPROSTHESIS 25 MM (N/A)  Patient location during evaluation: SICU Anesthesia Type: General Level of consciousness: sedated Pain management: pain level controlled Vital Signs Assessment: post-procedure vital signs reviewed and stable Respiratory status: patient remains intubated per anesthesia plan Cardiovascular status: stable Anesthetic complications: no       Last Vitals:  Vitals:   02/05/17 1849 02/05/17 1900  BP: (!) 103/35   Pulse: (!) 102   Resp: 16 16  Temp:  36.4 C    Last Pain:  Vitals:   02/05/17 0400  TempSrc: Core (Comment)  PainSc:                  [name] [name] DAVID

## 2017-02-05 NOTE — Progress Notes (Signed)
  Echocardiogram Echocardiogram Transesophageal has been performed.  [name] [name] 02/05/2017, 9:58 AM

## 2017-02-05 NOTE — Progress Notes (Signed)
Patient ID: [name] [name], female   DOB: 05/18/1935, 82 y.o.   MRN: [id number] Cardiothoracic Surgery:  TEE reviewed with Dr. Haroldine Laws. There is wide open MR due to flail posterior mitral leaflet, most likely papillary muscle rupture. She is still in cardiogenic shock but LV function looks reasonable and her only chance of survival is with emergent CABG and MV replacement. I discussed the surgery, high risks including postop renal failure requiring dialysis, multi-system organ failure and death with her daughter by telephone and she would like to proceed with surgery. Patient's son is on the way to hospital. Posted in the OR.

## 2017-02-05 NOTE — Brief Op Note (Signed)
02/03/2017 - 02/05/2017  4:02 PM  PATIENT:  [name] [name]  82 y.o. female  PRE-OPERATIVE DIAGNOSIS:  Coronary artery disease  POST-OPERATIVE DIAGNOSIS:  Coronary artery disease  PROCEDURE:  Procedure(s): CORONARY ARTERY BYPASS GRAFTING (CABG) x 4 (N/A) TRANSESOPHAGEAL ECHOCARDIOGRAM (TEE) (N/A) MITRAL VALVE REPLACEMENT (MVR) WITH MAGNA MITRAL EASE PERICARDIAL BIOPROSTHESIS 25 MM (N/A) LIMA-LAD SEQ SVG-DIAG-OM SVG-PD  SURGEON:  Surgeon(s) and Role:    * Gaye Pollack, MD - Primary  PHYSICIAN ASSISTANT: Jadene Pierini PA-C  ANESTHESIA:   general  EBL:  Total I/O In: 1467.5 [I.V.:1467.5] Out: 24 [Urine:13]  BLOOD ADMINISTERED:1 UNIT CC PRBC  DRAINS: 3 Chest Tube(s) in the MEDIASTINUM AND PLEURA   LOCAL MEDICATIONS USED:  NONE  SPECIMEN:  Source of Specimen:  PAPILLARY MUSCLE  DISPOSITION OF SPECIMEN: PATHOLOGY COUNTS:  YES  TOURNIQUET:  * No tourniquets in log *  DICTATION: .Dragon Dictation  PLAN OF CARE: Admit to inpatient   PATIENT DISPOSITION:  ICU - intubated and hemodynamically stable.   Delay start of Pharmacological VTE agent (>24hrs) due to surgical blood loss or risk of bleeding: yes  COMPLICATIONS: NO KNOWN

## 2017-02-05 NOTE — OR Nursing (Signed)
1840 Pt's rings given to SICU nurse in specimen container with pt label on it. Given upon arrival to pt room.

## 2017-02-05 NOTE — Progress Notes (Signed)
1 Day Post-Op Procedure(s) (LRB): IABP Insertion (N/A) Right Heart Cath (N/A) Subjective: Patient examined after requiring urgent intubation and placement of IABP She is currently in cardiogenic shock, acidotic with pulmonary edema on CXR, low urine output on norepi She is not currently a candidate for CABG/MVR Recommend consult to Advance heart Failure service to treat cardiogenic shock  Objective: Vital signs in last 24 hours: Temp:  [95.5 F (35.3 C)-98.4 F (36.9 C)] 96.1 F (35.6 C) (02/15 0000) Pulse Rate:  [80-135] 87 (02/15 0000) Cardiac Rhythm: Normal sinus rhythm (02/14 2130) Resp:  [17-81] 20 (02/15 0000) BP: (70-140)/(49-89) 113/73 (02/15 0000) SpO2:  [0 %-100 %] 97 % (02/15 0000) FiO2 (%):  [100 %] 100 % (02/14 2050) Weight:  [158 lb (71.7 kg)] 158 lb (71.7 kg) (02/14 1241)  Hemodynamic parameters for last 24 hours: PAP: (47-51)/(17-24) 47/24 CVP:  [7 mmHg-13 mmHg] 13 mmHg  Intake/Output from previous day: No intake/output data recorded. Intake/Output this shift: No intake/output data recorded.  Sedated on vent IABP in place Extremities cool, poor pulses  Lab Results:  Recent Labs  02/03/17 1118 02/04/17 0211  WBC 7.8 8.6  HGB 11.4* 10.2*  HCT 36.8 33.7*  PLT 219 192   BMET:  Recent Labs  02/03/17 1118 02/04/17 0211  NA 142 141  K 3.6 3.6  CL 108 106  CO2 26 24  GLUCOSE 150* 141*  BUN 29* 22*  CREATININE 1.27* 1.15*  CALCIUM 9.5 9.2    PT/INR:  Recent Labs  02/03/17 1118  LABPROT 14.4  INR 1.11   ABG    Component Value Date/Time   PHART 7.443 02/04/2017 2204   HCO3 16.4 (L) 02/04/2017 2204   TCO2 17 02/04/2017 2204   ACIDBASEDEF 7.0 (H) 02/04/2017 2204   O2SAT 93.0 02/04/2017 2204   CBG (last 3)   Recent Labs  02/04/17 1121 02/04/17 1801 02/04/17 2342  GLUCAP 135* 224* 236*    Assessment/Plan: S/P Procedure(s) (LRB): IABP Insertion (N/A) Right Heart Cath (N/A) Patients surgery on hold- will discuss with Dr Cyndia Bent  in am   LOS: 2 days    Tharon Aquas Trigt III 02/05/2017

## 2017-02-05 NOTE — Transfer of Care (Signed)
Immediate Anesthesia Transfer of Care Note  Patient: [name] [name]  Procedure(s) Performed: Procedure(s): CORONARY ARTERY BYPASS GRAFTING (CABG) x 4                                                                               LIMA-LAD SEQ SVG-DIAG-OM SVG-PD (N/A) TRANSESOPHAGEAL ECHOCARDIOGRAM (TEE) (N/A) MITRAL VALVE REPLACEMENT (MVR) WITH MAGNA MITRAL EASE PERICARDIAL BIOPROSTHESIS 25 MM (N/A)  Patient Location: SICU  Anesthesia Type:General  Level of Consciousness: sedated, patient cooperative and Patient remains intubated per anesthesia plan  Airway & Oxygen Therapy: Patient remains intubated per anesthesia plan and Patient placed on Ventilator (see vital sign flow sheet for setting)  Post-op Assessment: Report given to RN and Post -op Vital signs reviewed and stable  Post vital signs: Reviewed and stable  Last Vitals:  Vitals:   02/05/17 1030 02/05/17 1849  BP:  (!) 103/35  Pulse:  (!) 102  Resp: 18 16  Temp:      Last Pain:  Vitals:   02/05/17 0400  TempSrc: Core (Comment)  PainSc:          Complications: No apparent anesthesia complications

## 2017-02-06 ENCOUNTER — Inpatient Hospital Stay (HOSPITAL_COMMUNITY): Payer: Medicare Other

## 2017-02-06 ENCOUNTER — Encounter (HOSPITAL_COMMUNITY): Payer: Self-pay | Admitting: Surgery

## 2017-02-06 DIAGNOSIS — G5603 Carpal tunnel syndrome, bilateral upper limbs: Secondary | ICD-10-CM | POA: Insufficient documentation

## 2017-02-06 DIAGNOSIS — I214 Non-ST elevation (NSTEMI) myocardial infarction: Principal | ICD-10-CM

## 2017-02-06 DIAGNOSIS — I340 Nonrheumatic mitral (valve) insufficiency: Secondary | ICD-10-CM

## 2017-02-06 LAB — PREPARE RBC (CROSSMATCH)

## 2017-02-06 LAB — PREPARE PLATELET PHERESIS
BLOOD PRODUCT EXPIRATION DATE: 201802172359
Blood Product Expiration Date: 201802172359
ISSUE DATE / TIME: 201802151619
ISSUE DATE / TIME: 201802151619
UNIT TYPE AND RH: 6200
Unit Type and Rh: 6200

## 2017-02-06 LAB — POCT I-STAT 3, ART BLOOD GAS (G3+)
Acid-base deficit: 5 mmol/L — ABNORMAL HIGH (ref 0.0–2.0)
Bicarbonate: 20.1 mmol/L (ref 20.0–28.0)
O2 Saturation: 99 %
PH ART: 7.353 (ref 7.350–7.450)
PO2 ART: 134 mmHg — AB (ref 83.0–108.0)
Patient temperature: 37.2
TCO2: 21 mmol/L (ref 0–100)
pCO2 arterial: 36.2 mmHg (ref 32.0–48.0)

## 2017-02-06 LAB — GLUCOSE, CAPILLARY
GLUCOSE-CAPILLARY: 106 mg/dL — AB (ref 65–99)
GLUCOSE-CAPILLARY: 106 mg/dL — AB (ref 65–99)
GLUCOSE-CAPILLARY: 115 mg/dL — AB (ref 65–99)
GLUCOSE-CAPILLARY: 117 mg/dL — AB (ref 65–99)
GLUCOSE-CAPILLARY: 120 mg/dL — AB (ref 65–99)
GLUCOSE-CAPILLARY: 123 mg/dL — AB (ref 65–99)
GLUCOSE-CAPILLARY: 124 mg/dL — AB (ref 65–99)
GLUCOSE-CAPILLARY: 125 mg/dL — AB (ref 65–99)
GLUCOSE-CAPILLARY: 126 mg/dL — AB (ref 65–99)
GLUCOSE-CAPILLARY: 134 mg/dL — AB (ref 65–99)
GLUCOSE-CAPILLARY: 135 mg/dL — AB (ref 65–99)
GLUCOSE-CAPILLARY: 142 mg/dL — AB (ref 65–99)
Glucose-Capillary: 123 mg/dL — ABNORMAL HIGH (ref 65–99)
Glucose-Capillary: 117 mg/dL — ABNORMAL HIGH (ref 65–99)
Glucose-Capillary: 110 mg/dL — ABNORMAL HIGH (ref 65–99)
Glucose-Capillary: 112 mg/dL — ABNORMAL HIGH (ref 65–99)
Glucose-Capillary: 128 mg/dL — ABNORMAL HIGH (ref 65–99)
Glucose-Capillary: 134 mg/dL — ABNORMAL HIGH (ref 65–99)
Glucose-Capillary: 134 mg/dL — ABNORMAL HIGH (ref 65–99)
Glucose-Capillary: 115 mg/dL — ABNORMAL HIGH (ref 65–99)
Glucose-Capillary: 112 mg/dL — ABNORMAL HIGH (ref 65–99)

## 2017-02-06 LAB — BASIC METABOLIC PANEL
Anion gap: 8 (ref 5–15)
BUN: 26 mg/dL — AB (ref 6–20)
CHLORIDE: 116 mmol/L — AB (ref 101–111)
CO2: 20 mmol/L — AB (ref 22–32)
Calcium: 7.5 mg/dL — ABNORMAL LOW (ref 8.9–10.3)
Creatinine, Ser: 2.63 mg/dL — ABNORMAL HIGH (ref 0.44–1.00)
GFR calc Af Amer: 19 mL/min — ABNORMAL LOW (ref >60)
GFR calc non Af Amer: 16 mL/min — ABNORMAL LOW (ref >60)
GLUCOSE: 106 mg/dL — AB (ref 65–99)
POTASSIUM: 4 mmol/L (ref 3.5–5.1)
Sodium: 144 mmol/L (ref 135–145)

## 2017-02-06 LAB — CBC
HCT: 29 % — ABNORMAL LOW (ref 36.0–46.0)
HEMATOCRIT: 29.8 % — AB (ref 36.0–46.0)
HEMOGLOBIN: 9.7 g/dL — AB (ref 12.0–15.0)
Hemoglobin: 9.5 g/dL — ABNORMAL LOW (ref 12.0–15.0)
MCH: 25.3 pg — AB (ref 26.0–34.0)
MCH: 25.3 pg — AB (ref 26.0–34.0)
MCHC: 32.6 g/dL (ref 30.0–36.0)
MCHC: 32.8 g/dL (ref 30.0–36.0)
MCV: 77.6 fL — AB (ref 78.0–100.0)
MCV: 77.1 fL — ABNORMAL LOW (ref 78.0–100.0)
PLATELETS: 94 10*3/uL — AB (ref 150–400)
Platelets: 105 10*3/uL — ABNORMAL LOW (ref 150–400)
RBC: 3.84 MIL/uL — AB (ref 3.87–5.11)
RBC: 3.76 MIL/uL — AB (ref 3.87–5.11)
RDW: 19.2 % — ABNORMAL HIGH (ref 11.5–15.5)
RDW: 19 % — ABNORMAL HIGH (ref 11.5–15.5)
WBC: 12.9 10*3/uL — ABNORMAL HIGH (ref 4.0–10.5)
WBC: 14.6 10*3/uL — ABNORMAL HIGH (ref 4.0–10.5)

## 2017-02-06 LAB — PREPARE FRESH FROZEN PLASMA
Blood Product Expiration Date: 201802192359
Blood Product Expiration Date: 201802192359
ISSUE DATE / TIME: 201802151618
ISSUE DATE / TIME: 201802151618
UNIT TYPE AND RH: 7300
Unit Type and Rh: 7300

## 2017-02-06 LAB — POCT I-STAT, CHEM 8
BUN: 26 mg/dL — ABNORMAL HIGH (ref 6–20)
BUN: 27 mg/dL — ABNORMAL HIGH (ref 6–20)
CALCIUM ION: 1.12 mmol/L — AB (ref 1.15–1.40)
CHLORIDE: 110 mmol/L (ref 101–111)
CREATININE: 2.4 mg/dL — AB (ref 0.44–1.00)
CREATININE: 3 mg/dL — AB (ref 0.44–1.00)
Calcium, Ion: 1.11 mmol/L — ABNORMAL LOW (ref 1.15–1.40)
Chloride: 110 mmol/L (ref 101–111)
GLUCOSE: 126 mg/dL — AB (ref 65–99)
GLUCOSE: 128 mg/dL — AB (ref 65–99)
HCT: 25 % — ABNORMAL LOW (ref 36.0–46.0)
HCT: 30 % — ABNORMAL LOW (ref 36.0–46.0)
HEMOGLOBIN: 8.5 g/dL — AB (ref 12.0–15.0)
Hemoglobin: 10.2 g/dL — ABNORMAL LOW (ref 12.0–15.0)
POTASSIUM: 3.7 mmol/L (ref 3.5–5.1)
Potassium: 3.4 mmol/L — ABNORMAL LOW (ref 3.5–5.1)
Sodium: 146 mmol/L — ABNORMAL HIGH (ref 135–145)
Sodium: 145 mmol/L (ref 135–145)
TCO2: 22 mmol/L (ref 0–100)
TCO2: 22 mmol/L (ref 0–100)

## 2017-02-06 LAB — COOXEMETRY PANEL
Carboxyhemoglobin: 0.9 % (ref 0.5–1.5)
METHEMOGLOBIN: 1.4 % (ref 0.0–1.5)
O2 Saturation: 69 %
Total hemoglobin: 9.5 g/dL — ABNORMAL LOW (ref 12.0–16.0)

## 2017-02-06 LAB — CREATININE, SERUM
CREATININE: 2.94 mg/dL — AB (ref 0.44–1.00)
GFR calc Af Amer: 16 mL/min — ABNORMAL LOW (ref >60)
GFR calc non Af Amer: 14 mL/min — ABNORMAL LOW (ref >60)

## 2017-02-06 LAB — MAGNESIUM
MAGNESIUM: 1.8 mg/dL (ref 1.7–2.4)
Magnesium: 1.8 mg/dL (ref 1.7–2.4)

## 2017-02-06 MED ORDER — DEXTROSE 5 % IV SOLN
1.5000 g | INTRAVENOUS | Status: AC
  Administered 2017-02-07: 1.5 g via INTRAVENOUS
  Filled 2017-02-06: qty 1.5

## 2017-02-06 MED ORDER — ORAL CARE MOUTH RINSE
15.0000 mL | Freq: Four times a day (QID) | OROMUCOSAL | Status: DC
  Administered 2017-02-06 – 2017-02-10 (×19): 15 mL via OROMUCOSAL

## 2017-02-06 MED ORDER — SODIUM CHLORIDE 0.9 % IV SOLN: Freq: Once | INTRAVENOUS | Status: DC

## 2017-02-06 MED ORDER — VASOPRESSIN 20 UNIT/ML IV SOLN
0.0300 [IU]/min | INTRAVENOUS | Status: DC
  Administered 2017-02-06: 0.03 [IU]/min via INTRAVENOUS
  Filled 2017-02-06: qty 2

## 2017-02-06 MED ORDER — FUROSEMIDE 10 MG/ML IJ SOLN
40.0000 mg | Freq: Once | INTRAMUSCULAR | Status: AC
  Administered 2017-02-06: 40 mg via INTRAVENOUS

## 2017-02-06 MED ORDER — CHLORHEXIDINE GLUCONATE 0.12% ORAL RINSE (MEDLINE KIT)
15.0000 mL | Freq: Two times a day (BID) | OROMUCOSAL | Status: DC
  Administered 2017-02-06 – 2017-02-10 (×9): 15 mL via OROMUCOSAL

## 2017-02-06 MED ORDER — FENTANYL CITRATE (PF) 100 MCG/2ML IJ SOLN
25.0000 ug | INTRAMUSCULAR | Status: DC | PRN
  Administered 2017-02-10: 25 ug via INTRAVENOUS
  Filled 2017-02-06: qty 2

## 2017-02-06 MED FILL — Mannitol IV Soln 20%: INTRAVENOUS | Qty: 500 | Status: AC

## 2017-02-06 MED FILL — Electrolyte-R (PH 7.4) Solution: INTRAVENOUS | Qty: 4000 | Status: AC

## 2017-02-06 MED FILL — Sodium Bicarbonate IV Soln 8.4%: INTRAVENOUS | Qty: 150 | Status: AC

## 2017-02-06 MED FILL — Heparin Sodium (Porcine) Inj 1000 Unit/ML: INTRAMUSCULAR | Qty: 10 | Status: AC

## 2017-02-06 MED FILL — Calcium Chloride Inj 10%: INTRAVENOUS | Qty: 10 | Status: AC

## 2017-02-06 MED FILL — Sodium Chloride IV Soln 0.9%: INTRAVENOUS | Qty: 4000 | Status: AC

## 2017-02-06 MED FILL — Lidocaine HCl IV Inj 20 MG/ML: INTRAVENOUS | Qty: 5 | Status: AC

## 2017-02-06 NOTE — Plan of Care (Signed)
Problem: Activity: Goal: Risk for activity intolerance will decrease Outcome: Not Progressing Pt remains on bedrest with IABP in place.

## 2017-02-06 NOTE — Progress Notes (Signed)
EKG CRITICAL VALUE     12 lead EKG performed.  Critical value noted.  Lattie Haw, RN notified.   Neva Seat, CCT 02/06/2017 6:58 AM

## 2017-02-06 NOTE — Op Note (Addendum)
CARDIOVASCULAR SURGERY OPERATIVE NOTE  02/05/2017  Surgeon:  Gaye Pollack, MD  First Assistant: Jadene Pierini,  PA-C   Preoperative Diagnosis:  Severe multi-vessel coronary artery disease and severe mitral regurgitation due to acute papillary muscle rupture with cardiogenic shock and multi-system organ failure.   Postoperative Diagnosis:  Same   Procedure:  1. Median Sternotomy 2. Extracorporeal circulation 3.   Coronary artery bypass grafting x 4   Left internal mammary graft to the LAD  Sequential SVG to diagonal and OM  SVG to PDA  4.   Endoscopic vein harvest from the right leg 5.   Chordal sparing Mitral valve replacement using a 25 mm Edwards Magna-Ease pericardial valve  Anesthesia:  General Endotracheal   Clinical History/Surgical Indication:  This is a 82 year old female with a past medical history of type 2 diabetes mellitus, hypertension, hyperlipidemia, acute kidney injury, and a history of glaucoma who was seen on 02/03/2017 by Dr. Farrel Conners with a new complaint of chest pain. She had no prior cardiac history until she started having chest pain and nausea on Saturday and again Sunday. Admitted with NSTEMI with troponin 6.48 yesterday. Cath reviewed and shows severe 3 vessel coronary disease with tight proximal LAD and LCX, occluded RCA. Her echo was reviewed and shows an EF of 45% with anterior hypokinesis, anterolateral and inferolateral akinesis with severe central MR that appears to be due to annular dilation. She has not had any significant SOB or orthopnea but had mild pulmonary interstitial edema on CXR. Plans were made for urgent surgery the following day but about an hour and a half after I talked with her and her son she developed hypotension, N/V and became lethargic with ECG showing anteroseptal ST depression. There was already an emergency CABG going on in the OR and  she was intubated and taken to the cath lab where IABP and Swan inserted. Left heart cath not done. She required escalating doses of levophed for cardiogenic shock and was started on dobutamine. She remained in cardiogenic shock with acute anuric renal failue, shock liver and acute hypoxemic respiratory failure on the vent. When I saw her in the morning she had a new holosystolic murmur and a TEE showed a flail posterior mitral leaflet with wide open MR. She was still in cardiogenic shock but LV function looked reasonable and her only chance of survival was with emergent CABG and MV replacement. I discussed the surgery, high risks including postop renal failure requiring dialysis, multi-system organ failure and death with her daughter by telephone and she agreed to proceed with surgery.  Preparation:  The patient was taken straight back to the OR intubated with an IABP.  The consent was signed by me. Preoperative antibiotics were given. A pulmonary arterial line and radial arterial line were placed by the anesthesia team. She was positioned supine on the operating room table. After being placed under general endotracheal anesthesia by the anesthesia team the neck, chest, abdomen, and both legs were prepped with betadine soap and solution and draped in the usual sterile manner. A surgical time-out was taken and the correct patient and operative procedure were confirmed with the nursing and anesthesia staff.  TEE: performed by Dr. Lillia Abed  Conclusions   Result status: Final result   Left ventricle: Normal cavity size. LV systolic function is mildly to moderately reduced with an EF of 40-45%.  Mitral valve: No leaflet thickening and calcification present. Ruptured chordae causing severe posterior leaflet incompetence. Severe regurgitation. Large flail portion of  the posterior leaflet involving the medial scallop.  Tricuspid valve: Mild regurgitation. The tricuspid valve regurgitation jet is  central.     Cardiopulmonary Bypass:  A median sternotomy was performed. The pericardium was opened in the midline. Right ventricular function appeared normal. The ascending aorta was of normal size and had no palpable plaque. There were no contraindications to aortic cannulation or cross-clamping. The patient was fully systemically heparinized and the ACT was maintained > 400 sec. The proximal aortic arch was cannulated with a 20 F aortic cannula for arterial inflow. Bi-caval venous cannulation was performed via the SVC with a 24 F metal tip right angle cannula and the IVC with a 32 F plastic malleable cannnula. An antegrade cardioplegia/vent cannula was inserted into the mid-ascending aorta. A retrograde cardioplegia cannula was placed in the coronary sinus via the right atrium. Aortic occlusion was performed with a single cross-clamp. Systemic cooling to 28 degrees Centigrade and topical cooling of the heart with iced saline were used. Hyperkalemic antegrade and retrograde cold blood cardioplegia was used to induce diastolic arrest and was then given at about 20 minute intervals throughout the period of arrest to maintain myocardial temperature at or below 10 degrees centigrade. A temperature probe was inserted into the interventricular septum and an insulating pad was placed in the pericardium.   Left internal mammary harvest:  The left side of the sternum was retracted using the Rultract retractor. The left internal mammary artery was harvested as a pedicle graft. All side branches were clipped. It was a medium-sized vessel of good quality with excellent blood flow. It was ligated distally and divided. It was sprayed with topical papaverine solution to prevent vasospasm.   Endoscopic vein harvest:  The left greater saphenous vein was harvested endoscopically through a 2 cm incision medial to the left knee. It was harvested from the upper thigh to below the knee. It was a medium-sized vein of  good quality. The side branches were all ligated with 4-0 silk ties. We initially examined the saphenous vein in the right leg but it was too small.   Coronary arteries:  The coronary arteries were examined.   LAD:  Medium sized vessel with mild distal disease. The diagonal was also a medium sized vessel with heavy proximal disease only.  LCX:  Medium sized OM that was only diseased proximally  RCA:  Diffusely diseased. The PDA was small but graftable with minimal distal disease.   Grafts:  1. LIMA to the LAD: 1.75 mm. It was sewn end to side using 8-0 prolene continuous suture. 2. Sequential SVG to diagonal:  1.6 mm. It was sewn sequential side to side using 7-0 prolene continuous suture. 3. Sequential SVG to OM:  1.75 mm. It was sewn sequential end to side using 7-0 prolene continuous suture. 4. SVG to PDA:  1.6 mm. It was sewn end to side using 7-0 prolene continuous suture.   The diagonal and OM were grafted sequentially due the lack of adequate vein length to do them separately.  The proximal vein graft anastomoses were performed to the mid-ascending aorta using continuous 6-0 prolene suture. Graft markers were placed around the proximal anastomoses.  Mitral Valve Replacement:   The left atrium was opened through a vertical incision in the interatrial groove. Exposure was good. Valve inspection showed fairly normal valve leaflets but there was a rupture of one of the heads of the posteromedial papillary muscle to the posterior leaflet. There was a large flail segment of the posterior leaflet  comprising all of P2 and part of P3. The anterior leaflet was split down the middle and a paddle of the leaflet was left attached to the  mitral annulus laterally and medially using the valve sutures. The ruptured papillary muscle and the attached cords were excised. The remainder of the posterior leaflet was left intact and folded up using the valve sutures to reinforced the posterior annulus  and maintain the subvalvular apparatus.  A series of pledgetted 2-0 Ethibond sutures were placed around the mitral annulus. A 25 mm Edwards Magna-Ease valve was chosen ( model [id number], [place] [id number]). The sutures were placed through the sewing ring and it was lowered into place. The sutures were tied. The valve was tested with saline and there was no regurgitation. The atrium was closed with 2 layers of continuous 3-0 prolene suture.   Completion:  The patient was rewarmed to 37 degrees Centigrade. The clamp was removed from the LIMA pedicle and there was rapid warming of the septum and no intrinsic rhythm.  The crossclamp was removed with a time of 172 minutes. The distal and proximal anastomoses were checked for hemostasis. The position of the grafts was satisfactory. Two temporary epicardial pacing wires were placed on the right atrium and two on the right ventricle. She was paced DOO. The patient was weaned from CPB without difficulty on Milrinone and levophed.  CPB time was 231 minutes. Cardiac output was 3.8 LPM. TEE showed a normally functioning mitral valve prosthesis.  Heparin was fully reversed with protamine and the aortic and venous cannulas removed. Hemostasis was achieved. Mediastinal and bilateral pleural drainage tubes were placed. The sternum was closed with double #6 stainless steel wires. The fascia was closed with continuous # 1 vicryl suture. The subcutaneous tissue was closed with 2-0 vicryl continuous suture. The skin was closed with 3-0 vicryl subcuticular suture. All sponge, needle, and instrument counts were reported correct at the end of the case. Dry sterile dressings were placed over the incisions and around the chest tubes which were connected to pleurevac suction. The patient was then transported to the surgical intensive care unit in critical but stable condition.

## 2017-02-06 NOTE — Progress Notes (Signed)
Patients two rings were given to son and daughter in specimen cup to take home.

## 2017-02-06 NOTE — Progress Notes (Signed)
PULMONARY / CRITICAL CARE MEDICINE   Name: [name] [name] MRN: [id number] DOB: Jan 16, 1935    ADMISSION DATE:  02/03/2017 CONSULTATION DATE:  02/04/2017  REFERRING MD:  Dr. Martinique  CHIEF COMPLAINT:  AMS  HISTORY OF PRESENT ILLNESS:  82 year old female with past medical history as below, which is significant for CAD, diabetes mellitus, and hypertension. She presented to cardiologist to/13 with complaints of chest pain and shortness of breath while doing light housework onset about 3 days prior to arrival. She had an EKG done in the clinic, which was concerning for ischemia. She was referred for urgent cardiac cath which revealed three-vessel CAD. She was admitted in transfer to [facility] for urgent CABG which was scheduled for 2/15. After arrival to [facility] on 2/14 she was noted to be lethargic and hypotensive after doing well all day. She was in cardiogenic shock secondary to coronary artery disease requiring intubation, vasopressors, and emergent blood pressure support with intra-aortic balloon pump. The procedure was without complication, and she was sent to the ICU for recovery. PCCM to assist with ventilator and medical management. Patient then had CABG x 4 and MV replacement on 2/15.    SUBJECTIVE:  Pt had CABG x 4 and MV replacement on 2/15 pm.  Remains on the vent  VITAL SIGNS: BP (!) 115/56   Pulse 86   Temp 99.1 F (37.3 C)   Resp 12   Ht 5\' 6"  (1.676 m)   Wt 84.9 kg (187 lb 2.7 oz)   SpO2 100%   BMI 30.21 kg/m   HEMODYNAMICS: PAP: (18-54)/(8-31) 28/13 CVP:  [22 mmHg-23 mmHg] 22 mmHg CO:  [2.8 L/min-4 L/min] 3.2 L/min CI:  [1.5 L/min/m2-2.1 L/min/m2] 1.7 L/min/m2  VENTILATOR SETTINGS: Vent Mode: SIMV;PSV FiO2 (%):  [50 %-100 %] 50 % Set Rate:  [12 bmp-16 bmp] 12 bmp Vt Set:  [500 mL] 500 mL PEEP:  [5 cmH20-8 cmH20] 5 cmH20 Pressure Support:  [10 cmH20] 10 cmH20 Plateau Pressure:  [20 cmH20-24 cmH20] 22 cmH20  INTAKE / OUTPUT: I/O last 3 completed  shifts: In: 8099.7 [I.V.:5604.7; Blood:1930; IV IOMBTDHRC:163] Out: 8453 [MIWOE:3212; Blood:1500; Chest Tube:655]  PHYSICAL EXAMINATION: General:  Elderly female in no acute distress on ventilator. Intubated. Sedated. Neuro:  Sedated.  CN grossly intact. (-) lateralizing signs.  HEENT:  Normocephalic/atraumatic, PERRL, mild JVD Cardiovascular:  RRR, no MRG Lungs:  Good ae. Crackles at bases. (-) wheezing/rhonchi. (+) CT per surgery.  Abdomen:  Soft, nondistended. (-) masses/tenderness. Dec BS Musculoskeletal:  No acute deformity Skin:  Grossly intact. Warm distal extremities.   LABS:  BMET  Recent Labs Lab 02/04/17 0211 02/05/17 0515  02/05/17 1539 02/05/17 1712 02/05/17 1842 02/05/17 2300 02/06/17 0417  NA 141 142  < > 145 146* 145  --  144  K 3.6 3.4*  < > 4.5 3.8 3.6  --  4.0  CL 106 114*  < > 110 111  --   --  116*  CO2 24 19*  --   --   --   --   --  20*  BUN 22* 28*  < > 28* 30*  --   --  26*  CREATININE 1.15* 2.13*  < > 2.20* 2.30*  --  2.42* 2.63*  GLUCOSE 141* 221*  < > 123* 105* 109*  --  106*  < > = values in this interval not displayed.  Electrolytes  Recent Labs Lab 02/04/17 0211 02/05/17 0515 02/05/17 0930 02/05/17 2300 02/06/17 0417  CALCIUM 9.2  7.9*  --   --  7.5*  MG  --   --  1.8 1.9 1.8  PHOS  --   --  2.6  --   --     CBC  Recent Labs Lab 02/05/17 1844 02/05/17 2300 02/06/17 0417  WBC 20.1* 13.9* 12.9*  HGB 9.6* 8.4* 9.7*  HCT 29.8* 26.0* 29.8*  PLT 114* 104* 105*    Coag's  Recent Labs Lab 02/03/17 1118 02/05/17 0240 02/05/17 1844  APTT  --  61* 83*  INR 1.11  --  1.87    Sepsis Markers  Recent Labs Lab 02/05/17 0835  LATICACIDVEN 1.6    ABG  Recent Labs Lab 02/05/17 1444 02/05/17 1751 02/05/17 1856  PHART 7.269* 7.389 7.504*  PCO2ART 50.4* 33.7 29.6*  PO2ART 323.0* 142.0* 237.0*    Liver Enzymes  Recent Labs Lab 02/04/17 0211 02/05/17 0515  AST 72* 527*  ALT 24 313*  ALKPHOS 63 91  BILITOT 1.2  1.5*  ALBUMIN 3.5 2.7*    Cardiac Enzymes  Recent Labs Lab 02/03/17 1118  TROPONINI 6.48*    Glucose  Recent Labs Lab 02/06/17 0259 02/06/17 0403 02/06/17 0457 02/06/17 0554 02/06/17 0657 02/06/17 0800  GLUCAP 123* 117* 110* 106* 112* 115*    Imaging Dg Chest Port 1 View  Result Date: 02/06/2017 CLINICAL DATA:  Status post coronary bypass grafting EXAM: PORTABLE CHEST 1 VIEW COMPARISON:  02/05/2017 FINDINGS: Cardiac shadow is stable. Postsurgical changes are again seen. An intra-aortic balloon pump is noted in satisfactory position. Endotracheal tube and nasogastric catheter are seen. Pericardial drain, mediastinal drain and bilateral thoracostomy catheters are noted. Swan-Ganz catheter is stable. No pneumothorax is seen. Patchy changes in the left base are again noted IMPRESSION: Postsurgical changes with tubes and lines as described. Persistent left basilar changes. Electronically Signed   By: Inez Catalina M.D.   On: 02/06/2017 07:53   Dg Chest Port 1 View  Result Date: 02/05/2017 CLINICAL DATA:  Status post CABG EXAM: PORTABLE CHEST 1 VIEW COMPARISON:  Chest radiograph 02/05/2017 at 6:09 a.m. FINDINGS: The patient has undergone median sternotomy. There is now prosthetic mitral valve. There are chest tubes overlying the right upper and left lower hemi thoraces. Intra-aortic balloon pump radiopaque marker is just above the left hilum. Endotracheal tube tip is just below the level of the clavicular heads. NG tube side port overlies the gastric body. Pulmonary artery catheter tip overlies the main pulmonary artery. There is shallow lung inflation and bibasilar atelectasis. Suspected small left pleural effusion. No focal consolidation. Mild pulmonary edema. IMPRESSION: 1. Status post CABG and mitral valve replacement. 2. PA catheter tip overlying the main pulmonary artery. 3. Intra-aortic balloon pump marker overlies the distal aortic arch. 4. Mild pulmonary edema and bibasilar  atelectasis. Electronically Signed   By: Ulyses Jarred M.D.   On: 02/05/2017 19:17     STUDIES:  LHC 2/13 > significant three-vessel coronary artery disease, including sequential 70% 80% proximal and mid LAD stenoses, mid left circumflex lesion, and chronic subtotal occlusion of the mid RCA. Echo 2/13 > LVEF 40-45%, hypokinesis of the anterior myocardium, akinesis of the anterolateral and inferior lateral myocardium. Grade 2 diastolic dysfunction.  CULTURES: MRSA 2/14 > (-) Trache asp 2/14 >   ANTIBIOTICS: Cefuroxime 2/14 >  Vanc 2/14 >   SIGNIFICANT EVENTS: 2/14 admit for urgent CABG but pt in cardiogenic shock. Intubated.  2/15 CABG x 4, MV replacement   LINES/TUBES: L fem art sheath 2/14 > L IJ Luiz Blare  2/14 > L radial art line 2/15 >    DISCUSSION: 44F, with severe 3VD, in cardiogenic shock.  Initial plan was for CABG but it is on hold as she is too sick for CABG. Intubated. With IABP. On pressors. Patient subsequently had CABG x 4 and MV replacement on 2/15.   ASSESSMENT / PLAN:  PULMONARY A: Acute hypoxemic respiratory failure secondary to acute systolic heart failure exacerbation/pulmonary edema + CAD/ischemia + S/P CABG 2/15  P:   Full vent support.  No weaning for now.  ABG is acceptable.  Lasix per Cards.  Vap prevention bundle   CARDIOVASCULAR A:  [name], MV replacement 2/15 Cardiogenic shock secondary to acute heart failure/CAD/Ischemia Three-vessel CAD, severe P:  Cont vent support Per TCVS/cardiology MAP goal greater than 65 mmHg On levophed drip Cont IABP Heparin drip was discontinued with CABG Agree with diuresis   RENAL A:   Acute kidney injury  P:   Follow BMP Strict I&O Making urine.  Agree with diuresis (gentle).    GASTROINTESTINAL A:   No acute issues  P:   Nothing by mouth Protonix IV for stress ulcer prophylaxis   HEMATOLOGIC A:   Anemia, uncertain chronicity P:  Follow CBC Heparin infusion on hold with  CABG.    INFECTIOUS A:   No acute issues P:   Follow WBC and fever curve  On cefuroxime and vanc perioperatively.  (-) evidence for infection for now. Observe.    ENDOCRINE A:   Diabetes mellitus P:   CBG monitoring  Insulin infusion per primary   NEUROLOGIC A:   Acute metabolic encephalopathy in the setting of shock P:   RASS goal: -1 to -2 Precedex drip (came with it post op) Fentanyl pushes and Versed pushes.  She appears comfortable.    FAMILY  - Updates: Family updated by cardiology on 2/14.  No family at bedside on 2/15.   - Inter-disciplinary family meet or Palliative Care meeting due by:  2/20    I spent  30  minutes of Critical Care time with this patient today.    Monica Becton, MD 02/06/2017, 9:53 AM [facility] Pager [phone] After 3 pm or if no answer, call [phone]

## 2017-02-06 NOTE — Progress Notes (Signed)
PHARMACY NOTE:  ANTIMICROBIAL RENAL DOSAGE ADJUSTMENT  Current antimicrobial regimen includes a mismatch between antimicrobial dosage and estimated renal function.  As per policy approved by the Pharmacy & Therapeutics and Medical Executive Committees, the antimicrobial dosage will be adjusted accordingly.  Current antimicrobial dosage:  Cefuroxime 1.5gm IV q12h for 4 doses  Indication: surgical prophyalxis  Renal Function:  Estimated Creatinine Clearance: 16.7 mL/min (by C-G formula based on SCr of 3 mg/dL (H)).     Antimicrobial dosage has been changed to:  Cefuroxime 1.5gm IV q24h for 1 dose (next due 02/07/17 ~ 10am)   Hildred Laser, Pharm D 02/06/2017 6:12 PM

## 2017-02-06 NOTE — Progress Notes (Signed)
Advanced Heart Failure Rounding Note  PCP:  Primary Cardiologist:   Subjective:    S/p emergent CABG x 4 and MVR with bioprosthetic valve for LCX NSTEMI with flail MV  Remains intubated and sedated. On IABP with 1:1 support. Currently on milrinone 0.375 mcg/kg/min, norepi 12, and vasopressin 0.03.  Out 1.7 L of urine. Weight up 17 lbs from pre op weight, though ? Accuracy due to bed weight. IABP remains in place   Hemodynamics performed at bedside with RN PAP 28/13 PAP mean 19 CO 3.3 CI 1.8 PCWP 21  Objective:   Weight Range: 187 lb 2.7 oz (84.9 kg) Body mass index is 30.21 kg/m.   Vital Signs:   Temp:  [96.3 F (35.7 C)-99.5 F (37.5 C)] 99.1 F (37.3 C) (02/16 0900) Pulse Rate:  [86-226] 86 (02/16 0900) Resp:  [12-25] 12 (02/16 0900) BP: (103-115)/(35-56) 115/56 (02/16 0900) SpO2:  [87 %-100 %] 100 % (02/16 0900) Arterial Line BP: (128-144)/(46-51) 128/46 (02/15 1030) FiO2 (%):  [50 %-100 %] 50 % (02/16 0825) Weight:  [187 lb 2.7 oz (84.9 kg)] 187 lb 2.7 oz (84.9 kg) (02/16 0500) Last BM Date: 02/02/17  Weight change: Filed Weights   02/04/17 1241 02/05/17 0500 02/06/17 0500  Weight: 158 lb (71.7 kg) 171 lb 1.2 oz (77.6 kg) 187 lb 2.7 oz (84.9 kg)    Intake/Output:   Intake/Output Summary (Last 24 hours) at 02/06/17 1001 Last data filed at 02/06/17 0900  Gross per 24 hour  Intake          6652.44 ml  Output             4088 ml  Net          2564.44 ml     Physical Exam: General:  Intubated and sedated.  HEENT: + ETT Neck: supple. RIJ swan Carotids 2+ bilat; no bruits. No lymphadenopathy or thyromegaly appreciated. Cor: PMI nondisplaced. RRR.No M/G/R noted.  Lungs: Scattered rhonchi bilaterally.  Abdomen: soft, ND, no HSM. No bruits or masses. +BS  Extremities: no cyanosis, clubbing, rash. 1+ BLE edema. R groin IABP  Neuro: Intubated and sedated.   Telemetry: Reviewed, NSR  Labs: CBC  Recent Labs  02/03/17 1118 02/04/17 0211   02/05/17 2300 02/06/17 0417  WBC 7.8 8.6  < > 13.9* 12.9*  NEUTROABS 4.9 4.5  --   --   --   HGB 11.4* 10.2*  < > 8.4* 9.7*  HCT 36.8 33.7*  < > 26.0* 29.8*  MCV 72.6* 74.4*  < > 75.6* 77.6*  PLT 219 192  < > 104* 105*  < > = values in this interval not displayed. Basic Metabolic Panel  Recent Labs  02/05/17 0515 02/05/17 0930  02/05/17 1712 02/05/17 1842 02/05/17 2300 02/06/17 0417  NA 142  --   < > 146* 145  --  144  K 3.4*  --   < > 3.8 3.6  --  4.0  CL 114*  --   < > 111  --   --  116*  CO2 19*  --   --   --   --   --  20*  GLUCOSE 221*  --   < > 105* 109*  --  106*  BUN 28*  --   < > 30*  --   --  26*  CREATININE 2.13*  --   < > 2.30*  --  2.42* 2.63*  CALCIUM 7.9*  --   --   --   --   --  7.5*  MG  --  1.8  --   --   --  1.9 1.8  PHOS  --  2.6  --   --   --   --   --   < > = values in this interval not displayed. Liver Function Tests  Recent Labs  02/04/17 0211 02/05/17 0515  AST 72* 527*  ALT 24 313*  ALKPHOS 63 91  BILITOT 1.2 1.5*  PROT 5.8* 4.7*  ALBUMIN 3.5 2.7*   No results for input(s): LIPASE, AMYLASE in the last 72 hours. Cardiac Enzymes  Recent Labs  02/03/17 1118  TROPONINI 6.48*    BNP: BNP (last 3 results) No results for input(s): BNP in the last 8760 hours.  ProBNP (last 3 results) No results for input(s): PROBNP in the last 8760 hours.   D-Dimer No results for input(s): DDIMER in the last 72 hours. Hemoglobin A1C  Recent Labs  02/03/17 1839  HGBA1C 6.3*   Fasting Lipid Panel  Recent Labs  02/04/17 0211  CHOL 156  HDL 42  LDLCALC 93  TRIG 104  CHOLHDL 3.7   Thyroid Function Tests  Recent Labs  02/03/17 1839  TSH 0.576    Other results:     Imaging/Studies:  Dg Chest Port 1 View  Result Date: 02/06/2017 CLINICAL DATA:  Status post coronary bypass grafting EXAM: PORTABLE CHEST 1 VIEW COMPARISON:  02/05/2017 FINDINGS: Cardiac shadow is stable. Postsurgical changes are again seen. An intra-aortic balloon  pump is noted in satisfactory position. Endotracheal tube and nasogastric catheter are seen. Pericardial drain, mediastinal drain and bilateral thoracostomy catheters are noted. Swan-Ganz catheter is stable. No pneumothorax is seen. Patchy changes in the left base are again noted IMPRESSION: Postsurgical changes with tubes and lines as described. Persistent left basilar changes. Electronically Signed   By: Inez Catalina M.D.   On: 02/06/2017 07:53   Dg Chest Port 1 View  Result Date: 02/05/2017 CLINICAL DATA:  Status post CABG EXAM: PORTABLE CHEST 1 VIEW COMPARISON:  Chest radiograph 02/05/2017 at 6:09 a.m. FINDINGS: The patient has undergone median sternotomy. There is now prosthetic mitral valve. There are chest tubes overlying the right upper and left lower hemi thoraces. Intra-aortic balloon pump radiopaque marker is just above the left hilum. Endotracheal tube tip is just below the level of the clavicular heads. NG tube side port overlies the gastric body. Pulmonary artery catheter tip overlies the main pulmonary artery. There is shallow lung inflation and bibasilar atelectasis. Suspected small left pleural effusion. No focal consolidation. Mild pulmonary edema. IMPRESSION: 1. Status post CABG and mitral valve replacement. 2. PA catheter tip overlying the main pulmonary artery. 3. Intra-aortic balloon pump marker overlies the distal aortic arch. 4. Mild pulmonary edema and bibasilar atelectasis. Electronically Signed   By: Ulyses Jarred M.D.   On: 02/05/2017 19:17       Medications:     Scheduled Medications: . sodium chloride   Intravenous Once  . aspirin EC  325 mg Oral Daily   Or  . aspirin  324 mg Per Tube Daily  . bisacodyl  10 mg Oral Daily   Or  . bisacodyl  10 mg Rectal Daily  . cefUROXime (ZINACEF)  IV  1.5 g Intravenous Q12H  . chlorhexidine gluconate (MEDLINE KIT)  15 mL Mouth Rinse BID  . docusate sodium  200 mg Oral Daily  . insulin regular  0-10 Units Intravenous TID WC  .  mouth rinse  15 mL Mouth Rinse QID  . [  START ON 02/07/2017] pantoprazole  40 mg Oral Daily  . sodium chloride flush  3 mL Intravenous Q12H     Infusions: . sodium chloride 20 mL/hr at 02/06/17 0900  . sodium chloride    . sodium chloride 20 mL/hr at 02/06/17 0900  . dexmedetomidine 0.7 mcg/kg/hr (02/06/17 0948)  . insulin (NOVOLIN-R) infusion 2 Units/hr (02/06/17 0903)  . lactated ringers Stopped (02/06/17 0100)  . lactated ringers 20 mL/hr at 02/06/17 0900  . milrinone 0.374 mcg/kg/min (02/06/17 0900)  . nitroGLYCERIN Stopped (02/05/17 2300)  . norepinephrine (LEVOPHED) Adult infusion 15 mcg/min (02/06/17 0923)  . vasopressin (PITRESSIN) infusion - *FOR SHOCK* Stopped (02/06/17 0924)     PRN Medications:  sodium chloride, albumin human, fentaNYL (SUBLIMAZE) injection, lactated ringers, midazolam, morphine injection, ondansetron (ZOFRAN) IV, oxyCODONE, sodium chloride flush, traMADol   Assessment/Plan   1. CAD [name] 2. Severe MR with flail segment s/p MVR c/ bioprosthetic valve. 3. Cardiogenic shock 4. ARF 5. Shock Liver 6. Acute respiratory failure with hypoxemia requiring intubation  Remains intubated and sedated this am. Management per CCM.  Chest tubes in place.    Cardiac index marginal.  Would keep norepi and wean vasopressin first as tolerated.  May have to increase norepi to tolerate. Continue milrinone 0.375 mcg/kg/min for now.   NO weaning.   Continue IABP at current support. 1:1.  With some urine output but weight up post op.  Will give 40 mg IV lasix and follow response.    Follow renal function and LFTs as shock improves.   Length of Stay: 3  Annamaria Helling  02/06/2017, 10:01 AM  Advanced Heart Failure Team Pager [phone] (M-F; 7a - 4p)  Please contact [facility] for night-coverage after hours (4p -7a ) and weekends on [email or web address]  Patient seen and examined with Oda Kilts, PA-C. We discussed all aspects of the encounter. I  agree with the assessment and plan as stated above.   She is doing quite well post emergency CABG/MVR for flail MV leaflet. Creatinine up but from baseline but has stabilized and is making urine. Hemodynamics look decent though CO still a bit low. Will continue IABP and milrinone. Titrate norepi up slowly to get CI up around 2. Can stop vasopressin. Diurese gently. Hopefully can extubate soon.  The patient is critically ill with multiple organ systems failure and requires high complexity decision making for assessment and support, frequent evaluation and titration of therapies, application of advanced monitoring technologies and extensive interpretation of multiple databases.   Critical Care Time devoted to patient care services described in this note is 35 Minutes.  Romond Pipkins,MD 11:02 AM

## 2017-02-06 NOTE — Progress Notes (Signed)
30cc of 1mg /ml Versed and 170cc of `72mcg/ml Fentanyl wasted in sink with Eleonore Chiquito, RN.

## 2017-02-06 NOTE — Progress Notes (Signed)
1 Day Post-Op Procedure(s) (LRB): CORONARY ARTERY BYPASS GRAFTING (CABG) x 4                                                                               LIMA-LAD SEQ SVG-DIAG-OM SVG-PD (N/A) TRANSESOPHAGEAL ECHOCARDIOGRAM (TEE) (N/A) MITRAL VALVE REPLACEMENT (MVR) WITH MAGNA MITRAL EASE PERICARDIAL BIOPROSTHESIS 25 MM (N/A) Subjective:  Intubated and sedated on Precedex  Objective: Vital signs in last 24 hours: Temp:  [96.3 F (35.7 C)-99.5 F (37.5 C)] 99.1 F (37.3 C) (02/16 0645) Pulse Rate:  [90-226] 90 (02/16 0645) Cardiac Rhythm: Normal sinus rhythm (02/15 2000) Resp:  [12-25] 12 (02/16 0645) BP: (95-124)/(35-101) 103/35 (02/15 1849) SpO2:  [87 %-100 %] 100 % (02/16 0645) Arterial Line BP: (128-144)/(46-51) 128/46 (02/15 1030) FiO2 (%):  [50 %-100 %] 50 % (02/16 0515) Weight:  [84.9 kg (187 lb 2.7 oz)] 84.9 kg (187 lb 2.7 oz) (02/16 0500)  Hemodynamic parameters for last 24 hours: PAP: (18-68)/(8-31) 26/12 CVP:  [5 mmHg-23 mmHg] 22 mmHg CO:  [2.8 L/min-4 L/min] 3.2 L/min CI:  [1.5 L/min/m2-2.1 L/min/m2] 1.7 L/min/m2  Intake/Output from previous day: 02/15 0701 - 02/16 0700 In: 6882.1 [I.V.:4387.1; Blood:1930; IV DHRCBULAG:536] Out: 3908 [IWOEH:2122; Blood:1500; Chest Tube:655] Intake/Output this shift: No intake/output data recorded.  General appearance: mild anasarca Neurologic: reportedly woke up and moved all ext per nursing. Heart: regular rate and rhythm, S1, S2 normal, no murmur, click, rub or gallop Lungs: rhonchi bilaterally Abdomen: soft, non-distended, no BS Extremities: edema mild, pedal doppler flow present per nurses Wound: dressings dry chest tube output low, no air leak  Lab Results:  Recent Labs  02/05/17 2300 02/06/17 0417  WBC 13.9* 12.9*  HGB 8.4* 9.7*  HCT 26.0* 29.8*  PLT 104* 105*   BMET:  Recent Labs  02/05/17 0515  02/05/17 1712 02/05/17 1842 02/05/17 2300 02/06/17 0417  NA 142  < > 146* 145  --  144  K 3.4*  < > 3.8 3.6   --  4.0  CL 114*  < > 111  --   --  116*  CO2 19*  --   --   --   --  20*  GLUCOSE 221*  < > 105* 109*  --  106*  BUN 28*  < > 30*  --   --  26*  CREATININE 2.13*  < > 2.30*  --  2.42* 2.63*  CALCIUM 7.9*  --   --   --   --  7.5*  < > = values in this interval not displayed.  PT/INR:  Recent Labs  02/05/17 1844  LABPROT 21.8*  INR 1.87   ABG    Component Value Date/Time   PHART 7.504 (H) 02/05/2017 1856   HCO3 23.4 02/05/2017 1856   TCO2 24 02/05/2017 1856   ACIDBASEDEF 4.0 (H) 02/05/2017 1751   O2SAT 69.0 02/06/2017 0425   CBG (last 3)   Recent Labs  02/06/17 0457 02/06/17 0554 02/06/17 0657  GLUCAP 110* 106* 112*   CXR: Pulmonary vascular congestion and pulmonary edema that is mild, bilateral atelectasis.  ECG: sinus, anterolateral and inferior T wave abnormality, QTc 563  Assessment/Plan: S/P Procedure(s) (LRB): Emergent CORONARY ARTERY BYPASS GRAFTING (  CABG) x 4                                                                               LIMA-LAD SEQ SVG-DIAG-OM SVG-PD (N/A) TRANSESOPHAGEAL ECHOCARDIOGRAM (TEE) (N/A) MITRAL VALVE REPLACEMENT (MVR) WITH MAGNA MITRAL EASE PERICARDIAL BIOPROSTHESIS 25 MM (N/A)  Severe 3-vessel CAD presenting with acute NSTEMI. Initial echo showing severe central MR due to annular dilation. Sudden decompensation with acute systolic and diastolic heart failure, cardiogenic shock due to ruptured papillary muscle and wide open MR, acute respiratory failure, acute anuric renal failure and shock liver.  She has been labile overnight with marginal CI 1.7-2.1 on Milrinone 0.375, levophed up to 20 mcg, IABP, NO 30 ppm. Vasopressin added to support BP and allow using less Levophed. She has not been acidotic and has had good urine output so I think CI is probably adequate. PA pressures have been low. Will wean NO today but plan to continue milrinone and levophed, IABP and give her a day of stability before removing IABP.  Keep on vent today.    Preop acute renal failue: making adequate urine now although creat up a little higher. Continue to observe.  Preop shock liver: This should improve now. Will check LFT's tomorrow..   LOS: 3 days    [name] [name] 02/06/2017

## 2017-02-06 NOTE — Progress Notes (Signed)
Patient ID: [name] [name], female   DOB: [birth year], 82 y.o.   MRN: [id number]  SICU Evening Rounds:   Hemodynamically labile on Milrinone 0.375, levophed 17 and NO 2 ppm. IABP 1:1 CI = 2.3 PA 36/21  Remains sedated on vent Urine output 50-60/hr CT output low  CBC    Component Value Date/Time   WBC 14.6 (H) 02/06/2017 1636   RBC 3.76 (L) 02/06/2017 1636   HGB 10.2 (L) 02/06/2017 1658   HCT 30.0 (L) 02/06/2017 1658   PLT 94 (L) 02/06/2017 1636   MCV 77.1 (L) 02/06/2017 1636   MCH 25.3 (L) 02/06/2017 1636   MCHC 32.8 02/06/2017 1636   RDW 19.0 (H) 02/06/2017 1636   LYMPHSABS 2.9 02/04/2017 0211   MONOABS 0.9 02/04/2017 0211   EOSABS 0.3 02/04/2017 0211   BASOSABS 0.0 02/04/2017 0211     BMET    Component Value Date/Time   NA 145 02/06/2017 1658   K 3.7 02/06/2017 1658   CL 110 02/06/2017 1658   CO2 20 (L) 02/06/2017 0417   GLUCOSE 128 (H) 02/06/2017 1658   BUN 27 (H) 02/06/2017 1658   CREATININE 3.00 (H) 02/06/2017 1658   CALCIUM 7.5 (L) 02/06/2017 0417   GFRNONAA 14 (L) 02/06/2017 1636   GFRAA 16 (L) 02/06/2017 1636     A/P:  She remains critically ill with multi-system organ failure. Continue current pressors and IABP Repeat labs and CXR in am.

## 2017-02-07 ENCOUNTER — Inpatient Hospital Stay (HOSPITAL_COMMUNITY): Payer: Medicare Other

## 2017-02-07 DIAGNOSIS — Z951 Presence of aortocoronary bypass graft: Secondary | ICD-10-CM

## 2017-02-07 DIAGNOSIS — J154 Pneumonia due to other streptococci: Secondary | ICD-10-CM

## 2017-02-07 LAB — COOXEMETRY PANEL
Carboxyhemoglobin: 1.4 % (ref 0.5–1.5)
METHEMOGLOBIN: 0.8 % (ref 0.0–1.5)
O2 Saturation: 81.5 %
Total hemoglobin: 9.5 g/dL — ABNORMAL LOW (ref 12.0–16.0)

## 2017-02-07 LAB — CBC
HCT: 27.2 % — ABNORMAL LOW (ref 36.0–46.0)
HCT: 28.9 % — ABNORMAL LOW (ref 36.0–46.0)
HEMOGLOBIN: 8.7 g/dL — AB (ref 12.0–15.0)
Hemoglobin: 9.3 g/dL — ABNORMAL LOW (ref 12.0–15.0)
MCH: 25.1 pg — ABNORMAL LOW (ref 26.0–34.0)
MCH: 25.3 pg — AB (ref 26.0–34.0)
MCHC: 32 g/dL (ref 30.0–36.0)
MCHC: 32.2 g/dL (ref 30.0–36.0)
MCV: 78.4 fL (ref 78.0–100.0)
MCV: 78.7 fL (ref 78.0–100.0)
PLATELETS: 93 10*3/uL — AB (ref 150–400)
Platelets: 76 10*3/uL — ABNORMAL LOW (ref 150–400)
RBC: 3.47 MIL/uL — ABNORMAL LOW (ref 3.87–5.11)
RBC: 3.67 MIL/uL — AB (ref 3.87–5.11)
RDW: 19.8 % — AB (ref 11.5–15.5)
RDW: 19.8 % — AB (ref 11.5–15.5)
WBC: 13.8 10*3/uL — AB (ref 4.0–10.5)
WBC: 14.6 10*3/uL — ABNORMAL HIGH (ref 4.0–10.5)

## 2017-02-07 LAB — BLOOD GAS, ARTERIAL
ACID-BASE DEFICIT: 5.6 mmol/L — AB (ref 0.0–2.0)
BICARBONATE: 19.5 mmol/L — AB (ref 20.0–28.0)
FIO2: 50
MECHVT: 500 mL
O2 SAT: 97 %
PATIENT TEMPERATURE: 98.6
PCO2 ART: 39.8 mmHg (ref 32.0–48.0)
PEEP/CPAP: 5 cmH2O
PH ART: 7.312 — AB (ref 7.350–7.450)
PRESSURE SUPPORT: 10 cmH2O
RATE: 12 resp/min
pO2, Arterial: 102 mmHg (ref 83.0–108.0)

## 2017-02-07 LAB — COMPREHENSIVE METABOLIC PANEL
ALBUMIN: 2.3 g/dL — AB (ref 3.5–5.0)
ALK PHOS: 57 U/L (ref 38–126)
ALK PHOS: 61 U/L (ref 38–126)
ALT: 220 U/L — ABNORMAL HIGH (ref 14–54)
ALT: 240 U/L — AB (ref 14–54)
ANION GAP: 9 (ref 5–15)
AST: 211 U/L — ABNORMAL HIGH (ref 15–41)
AST: 243 U/L — AB (ref 15–41)
Albumin: 2.4 g/dL — ABNORMAL LOW (ref 3.5–5.0)
Anion gap: 11 (ref 5–15)
BUN: 29 mg/dL — AB (ref 6–20)
BUN: 28 mg/dL — AB (ref 6–20)
CALCIUM: 7.3 mg/dL — AB (ref 8.9–10.3)
CO2: 20 mmol/L — AB (ref 22–32)
CO2: 20 mmol/L — AB (ref 22–32)
Calcium: 7.2 mg/dL — ABNORMAL LOW (ref 8.9–10.3)
Chloride: 111 mmol/L (ref 101–111)
Chloride: 110 mmol/L (ref 101–111)
Creatinine, Ser: 2.85 mg/dL — ABNORMAL HIGH (ref 0.44–1.00)
Creatinine, Ser: 2.98 mg/dL — ABNORMAL HIGH (ref 0.44–1.00)
GFR calc Af Amer: 17 mL/min — ABNORMAL LOW (ref >60)
GFR calc non Af Amer: 15 mL/min — ABNORMAL LOW (ref >60)
GFR calc non Af Amer: 14 mL/min — ABNORMAL LOW (ref >60)
GFR, EST AFRICAN AMERICAN: 16 mL/min — AB (ref >60)
GLUCOSE: 169 mg/dL — AB (ref 65–99)
Glucose, Bld: 164 mg/dL — ABNORMAL HIGH (ref 65–99)
POTASSIUM: 3.6 mmol/L (ref 3.5–5.1)
Potassium: 3.7 mmol/L (ref 3.5–5.1)
SODIUM: 140 mmol/L (ref 135–145)
SODIUM: 141 mmol/L (ref 135–145)
Total Bilirubin: 3 mg/dL — ABNORMAL HIGH (ref 0.3–1.2)
Total Bilirubin: 3.4 mg/dL — ABNORMAL HIGH (ref 0.3–1.2)
Total Protein: 3.8 g/dL — ABNORMAL LOW (ref 6.5–8.1)
Total Protein: 4.1 g/dL — ABNORMAL LOW (ref 6.5–8.1)

## 2017-02-07 LAB — GLUCOSE, CAPILLARY
GLUCOSE-CAPILLARY: 133 mg/dL — AB (ref 65–99)
GLUCOSE-CAPILLARY: 159 mg/dL — AB (ref 65–99)
GLUCOSE-CAPILLARY: 150 mg/dL — AB (ref 65–99)
GLUCOSE-CAPILLARY: 120 mg/dL — AB (ref 65–99)
GLUCOSE-CAPILLARY: 128 mg/dL — AB (ref 65–99)
GLUCOSE-CAPILLARY: 116 mg/dL — AB (ref 65–99)
GLUCOSE-CAPILLARY: 103 mg/dL — AB (ref 65–99)
Glucose-Capillary: 116 mg/dL — ABNORMAL HIGH (ref 65–99)
Glucose-Capillary: 122 mg/dL — ABNORMAL HIGH (ref 65–99)
Glucose-Capillary: 129 mg/dL — ABNORMAL HIGH (ref 65–99)
Glucose-Capillary: 129 mg/dL — ABNORMAL HIGH (ref 65–99)
Glucose-Capillary: 132 mg/dL — ABNORMAL HIGH (ref 65–99)
Glucose-Capillary: 150 mg/dL — ABNORMAL HIGH (ref 65–99)

## 2017-02-07 LAB — MAGNESIUM: Magnesium: 1.6 mg/dL — ABNORMAL LOW (ref 1.7–2.4)

## 2017-02-07 MED ORDER — POTASSIUM CHLORIDE 20 MEQ/15ML (10%) PO SOLN
20.0000 meq | Freq: Once | ORAL | Status: AC
  Administered 2017-02-07: 20 meq
  Filled 2017-02-07: qty 15

## 2017-02-07 MED ORDER — FAMOTIDINE IN NACL 20-0.9 MG/50ML-% IV SOLN
20.0000 mg | Freq: Two times a day (BID) | INTRAVENOUS | Status: DC
  Administered 2017-02-07 – 2017-02-08 (×3): 20 mg via INTRAVENOUS
  Filled 2017-02-07 (×3): qty 50

## 2017-02-07 MED ORDER — FUROSEMIDE 10 MG/ML IJ SOLN
40.0000 mg | Freq: Two times a day (BID) | INTRAMUSCULAR | Status: AC
  Administered 2017-02-07 (×2): 40 mg via INTRAVENOUS
  Filled 2017-02-07 (×2): qty 4

## 2017-02-07 MED ORDER — SODIUM BICARBONATE 8.4 % IV SOLN
50.0000 meq | Freq: Once | INTRAVENOUS | Status: AC
  Administered 2017-02-07: 50 meq via INTRAVENOUS

## 2017-02-07 MED ORDER — DEXTROSE 5 % IV SOLN
1.0000 g | INTRAVENOUS | Status: DC
  Administered 2017-02-07 – 2017-02-11 (×5): 1 g via INTRAVENOUS
  Filled 2017-02-07 (×5): qty 10

## 2017-02-07 MED ORDER — NOREPINEPHRINE BITARTRATE 1 MG/ML IV SOLN
0.0000 ug/min | INTRAVENOUS | Status: DC
  Administered 2017-02-07: 15 ug/min via INTRAVENOUS
  Administered 2017-02-07: 18 ug/min via INTRAVENOUS
  Administered 2017-02-08: 9 ug/min via INTRAVENOUS
  Administered 2017-02-09: 2 ug/min via INTRAVENOUS
  Filled 2017-02-07 (×4): qty 16

## 2017-02-07 MED ORDER — INSULIN DETEMIR 100 UNIT/ML ~~LOC~~ SOLN
20.0000 [IU] | Freq: Every day | SUBCUTANEOUS | Status: DC
  Administered 2017-02-07 – 2017-02-08 (×2): 20 [IU] via SUBCUTANEOUS
  Filled 2017-02-07 (×3): qty 0.2

## 2017-02-07 MED ORDER — ATORVASTATIN CALCIUM 80 MG PO TABS
80.0000 mg | ORAL_TABLET | Freq: Every day | ORAL | Status: DC
  Administered 2017-02-07 – 2017-02-08 (×2): 80 mg
  Filled 2017-02-07 (×3): qty 1

## 2017-02-07 MED ORDER — MAGNESIUM SULFATE 2 GM/50ML IV SOLN
2.0000 g | Freq: Once | INTRAVENOUS | Status: AC
  Administered 2017-02-07: 2 g via INTRAVENOUS
  Filled 2017-02-07: qty 50

## 2017-02-07 MED ORDER — INSULIN ASPART 100 UNIT/ML ~~LOC~~ SOLN
0.0000 [IU] | SUBCUTANEOUS | Status: DC
  Administered 2017-02-07: 2 [IU] via SUBCUTANEOUS
  Administered 2017-02-10: 5 [IU] via SUBCUTANEOUS
  Administered 2017-02-10 (×2): 3 [IU] via SUBCUTANEOUS
  Administered 2017-02-10: 5 [IU] via SUBCUTANEOUS
  Administered 2017-02-10: 2 [IU] via SUBCUTANEOUS
  Administered 2017-02-10 (×2): 5 [IU] via SUBCUTANEOUS
  Administered 2017-02-11 (×5): 3 [IU] via SUBCUTANEOUS
  Administered 2017-02-12 (×3): 2 [IU] via SUBCUTANEOUS
  Administered 2017-02-12: 3 [IU] via SUBCUTANEOUS
  Administered 2017-02-12 – 2017-02-13 (×3): 2 [IU] via SUBCUTANEOUS
  Administered 2017-02-13: 3 [IU] via SUBCUTANEOUS
  Administered 2017-02-14: 2 [IU] via SUBCUTANEOUS
  Administered 2017-02-14: 3 [IU] via SUBCUTANEOUS
  Administered 2017-02-14 – 2017-02-15 (×4): 2 [IU] via SUBCUTANEOUS
  Administered 2017-02-15 – 2017-02-16 (×4): 3 [IU] via SUBCUTANEOUS
  Administered 2017-02-16 (×2): 2 [IU] via SUBCUTANEOUS
  Administered 2017-02-16 – 2017-02-17 (×2): 3 [IU] via SUBCUTANEOUS
  Administered 2017-02-17 – 2017-02-18 (×5): 2 [IU] via SUBCUTANEOUS
  Administered 2017-02-18: 3 [IU] via SUBCUTANEOUS
  Administered 2017-02-18 – 2017-02-19 (×5): 2 [IU] via SUBCUTANEOUS
  Administered 2017-02-19: 3 [IU] via SUBCUTANEOUS
  Administered 2017-02-20: 5 [IU] via SUBCUTANEOUS
  Administered 2017-02-20: 2 [IU] via SUBCUTANEOUS
  Administered 2017-02-21: 3 [IU] via SUBCUTANEOUS
  Administered 2017-02-21 – 2017-02-22 (×4): 2 [IU] via SUBCUTANEOUS
  Administered 2017-02-22: 3 [IU] via SUBCUTANEOUS
  Administered 2017-02-22 – 2017-02-23 (×2): 2 [IU] via SUBCUTANEOUS

## 2017-02-07 NOTE — Progress Notes (Addendum)
Patient ID: [name] [name], female   DOB: 01-Apr-1935, 82 y.o.   MRN: [id number]     Advanced Heart Failure Rounding Note  PCP:  Primary Cardiologist:   Subjective:    S/p emergent CABG x 4 and bioprosthetic MVR for LCX NSTEMI with flail MV  Remains intubated and sedated. On IABP with 1:1 support. Currently on milrinone 0.375 mcg/kg/min and norepi 18, off vasopressin.  Out 1.2 L of urine. Weight stable   [place] numbers:  PAP 38/22 CI 2.4  Objective:   Weight Range: 187 lb 9.8 oz (85.1 kg) Body mass index is 30.28 kg/m.   Vital Signs:   Temp:  [97.5 F (36.4 C)-99.1 F (37.3 C)] 97.9 F (36.6 C) (02/17 0700) Pulse Rate:  [86-218] 100 (02/17 0700) Resp:  [12-16] 12 (02/17 0700) BP: (73-121)/(41-73) 102/73 (02/17 0700) SpO2:  [98 %-100 %] 100 % (02/17 0700) FiO2 (%):  [50 %] 50 % (02/17 0410) Weight:  [187 lb 9.8 oz (85.1 kg)] 187 lb 9.8 oz (85.1 kg) (02/17 0340) Last BM Date:  (Prior to surgery)  Weight change: Filed Weights   02/05/17 0500 02/06/17 0500 02/07/17 0340  Weight: 171 lb 1.2 oz (77.6 kg) 187 lb 2.7 oz (84.9 kg) 187 lb 9.8 oz (85.1 kg)    Intake/Output:   Intake/Output Summary (Last 24 hours) at 02/07/17 0752 Last data filed at 02/07/17 0700  Gross per 24 hour  Intake          3704.31 ml  Output             2070 ml  Net          1634.31 ml     Physical Exam: General:  Intubated and sedated.  HEENT: + ETT Neck: supple. RIJ swan Carotids 2+ bilat; no bruits. No lymphadenopathy or thyromegaly appreciated. Cor: PMI nondisplaced. RRR. No M/G/R noted.  Pedal pulses dopplerable.  Lungs: Scattered rhonchi bilaterally.  Abdomen: soft, ND, no HSM. No bruits or masses. +BS  Extremities: no cyanosis, clubbing, rash. Trace ankle edema. R groin IABP.  Neuro: Intubated and sedated.   Telemetry: Reviewed, NSR  Labs: CBC  Recent Labs  02/07/17 0454 02/07/17 0456  WBC 14.6* 13.8*  HGB 9.3* 8.7*  HCT 28.9* 27.2*  MCV 78.7 78.4  PLT 93* 76*   Basic  Metabolic Panel  Recent Labs  02/05/17 0930  02/06/17 0417 02/06/17 1636  02/07/17 0454 02/07/17 0456  NA  --   < > 144  --   < > 141 140  K  --   < > 4.0  --   < > 3.7 3.6  CL  --   < > 116*  --   < > 110 111  CO2  --   --  20*  --   --  20* 20*  GLUCOSE  --   < > 106*  --   < > 164* 169*  BUN  --   < > 26*  --   < > 28* 29*  CREATININE  --   < > 2.63* 2.94*  < > 2.98* 2.85*  CALCIUM  --   --  7.5*  --   --  7.3* 7.2*  MG 1.8  < > 1.8 1.8  --   --   --   PHOS 2.6  --   --   --   --   --   --   < > = values in this interval not displayed. Liver Function  Tests  Recent Labs  02/07/17 0454 02/07/17 0456  AST 243* 211*  ALT 240* 220*  ALKPHOS 61 57  BILITOT 3.4* 3.0*  PROT 4.1* 3.8*  ALBUMIN 2.4* 2.3*   No results for input(s): LIPASE, AMYLASE in the last 72 hours. Cardiac Enzymes No results for input(s): CKTOTAL, CKMB, CKMBINDEX, TROPONINI in the last 72 hours.  BNP: BNP (last 3 results) No results for input(s): BNP in the last 8760 hours.  ProBNP (last 3 results) No results for input(s): PROBNP in the last 8760 hours.   D-Dimer No results for input(s): DDIMER in the last 72 hours. Hemoglobin A1C No results for input(s): HGBA1C in the last 72 hours. Fasting Lipid Panel No results for input(s): CHOL, HDL, LDLCALC, TRIG, CHOLHDL, LDLDIRECT in the last 72 hours. Thyroid Function Tests No results for input(s): TSH, T4TOTAL, T3FREE, THYROIDAB in the last 72 hours.  Invalid input(s): FREET3  Other results:     Imaging/Studies:  Dg Chest Port 1 View  Result Date: 02/07/2017 CLINICAL DATA:  Status post CABG EXAM: PORTABLE CHEST 1 VIEW COMPARISON:  Yesterday FINDINGS: Endotracheal tube tip between the clavicular heads and carina. Right IJ sheath with tip at the pulmonary artery outflow or main pulmonary artery. Intra-aortic balloon pump which is inflated at time of imaging, marker overlapping the arch. Thoracic drains present. Nasogastric tube reaches the stomach.  Unchanged atelectasis. No pulmonary edema or pneumothorax noted. Stable cardiomegaly after CABG and mitral valve replacement. IMPRESSION: 1. Stable positioning of tubes and lines as described. 2. Unchanged atelectasis.  No edema or visible pneumothorax. Electronically Signed   By: Monte Fantasia M.D.   On: 02/07/2017 07:25      Medications:     Scheduled Medications: . sodium chloride   Intravenous Once  . aspirin EC  325 mg Oral Daily   Or  . aspirin  324 mg Per Tube Daily  . atorvastatin  80 mg Per Tube q1800  . bisacodyl  10 mg Oral Daily   Or  . bisacodyl  10 mg Rectal Daily  . cefUROXime (ZINACEF)  IV  1.5 g Intravenous Q24H  . chlorhexidine gluconate (MEDLINE KIT)  15 mL Mouth Rinse BID  . docusate sodium  200 mg Oral Daily  . insulin regular  0-10 Units Intravenous TID WC  . mouth rinse  15 mL Mouth Rinse QID  . pantoprazole  40 mg Oral Daily  . sodium chloride flush  3 mL Intravenous Q12H    Infusions: . sodium chloride 20 mL/hr at 02/07/17 0700  . sodium chloride    . sodium chloride 10 mL/hr at 02/07/17 0600  . dexmedetomidine 0.7 mcg/kg/hr (02/07/17 0700)  . insulin (NOVOLIN-R) infusion 2.9 Units/hr (02/07/17 0747)  . lactated ringers Stopped (02/06/17 0100)  . lactated ringers 20 mL/hr at 02/07/17 0700  . milrinone 0.375 mcg/kg/min (02/07/17 0700)  . nitroGLYCERIN Stopped (02/05/17 2300)  . norepinephrine (LEVOPHED) Adult infusion 181 mcg/min (02/07/17 0700)  . vasopressin (PITRESSIN) infusion - *FOR SHOCK* Stopped (02/06/17 0924)    PRN Medications: sodium chloride, fentaNYL (SUBLIMAZE) injection, lactated ringers, midazolam, morphine injection, ondansetron (ZOFRAN) IV, oxyCODONE, sodium chloride flush, traMADol   Assessment/Plan   1. CAD [name] 2. Severe MR with flail segment s/p MVR c/ bioprosthetic valve. 3. Cardiogenic shock: Echo pre-op with EF 40-45%, lateral akinesis, severe MR.  4. AKI 5. Shock Liver 6. Acute respiratory failure with  hypoxemia requiring intubation 7. Thrombocytopenia: Post-op. 8. Tracheal aspirate with Strep pneumoniae  Remains intubated and sedated  this am. Management per CCM.  Chest tubes in place.    Cardiac index good this morning, off vasopressin now.  Wean norepinephrine as tolerated by BP today. Continue milrinone 0.375 mcg/kg/min for now.   Continue IABP at 1:1, doubt we will be able to wean today.  Site ok, pulses dopplerable.  Hgb and plts higher today.   UOP present but not vigorous, creatinine stable.  Will give Lasix 40 mg IV bid x 2 doses today.    Tracheal aspirate with Strep pneumo, CXR with atelectasis.  Afebrile, WBCs 14.6.  Would treat for tracheobronchitis with ceftriaxone.    Critical Care Time devoted to patient care services described in this note is 35 Minutes.  Sheamus Hasting,MD 7:52 AM  02/07/2017

## 2017-02-07 NOTE — Progress Notes (Signed)
ANTIBIOTIC CONSULT NOTE - INITIAL  Pharmacy Consult for Ceftriaxone Indication: pneumonia  No Known Allergies  Patient Measurements: Height: 5\' 6"  (167.6 cm) Weight: 187 lb 9.8 oz (85.1 kg) IBW/kg (Calculated) : 59.3  Vital Signs: Temp: 98.1 F (36.7 C) (02/17 0800) Temp Source: Core (Comment) (02/17 0400) BP: 96/66 (02/17 0800) Pulse Rate: 220 (02/17 0800) Intake/Output from previous day: 02/16 0701 - 02/17 0700 In: 3704.3 [I.V.:3644.3; NG/GT:60] Out: 2070 [Urine:1260; Emesis/NG output:450; Chest Tube:360] Intake/Output from this shift: No intake/output data recorded.  Labs:  Recent Labs  02/06/17 1636 02/06/17 1658 02/07/17 0454 02/07/17 0456  WBC 14.6*  --  14.6* 13.8*  HGB 9.5* 10.2* 9.3* 8.7*  PLT 94*  --  93* 76*  CREATININE 2.94* 3.00* 2.98* 2.85*   Estimated Creatinine Clearance: 17.6 mL/min (by C-G formula based on SCr of 2.85 mg/dL (H)). No results for input(s): VANCOTROUGH, VANCOPEAK, VANCORANDOM, GENTTROUGH, GENTPEAK, GENTRANDOM, TOBRATROUGH, TOBRAPEAK, TOBRARND, AMIKACINPEAK, AMIKACINTROU, AMIKACIN in the last 72 hours.   Microbiology: Recent Results (from the past 720 hour(s))  Surgical pcr screen     Status: None   Collection Time: 02/04/17  9:56 PM  Result Value Ref Range Status   MRSA, PCR NEGATIVE NEGATIVE Final   Staphylococcus aureus NEGATIVE NEGATIVE Final    Comment:        The Xpert SA Assay (FDA approved for NASAL specimens in patients over 98 years of age), is one component of a comprehensive surveillance program.  Test performance has been validated by [facility] for patients greater than or equal to 83 year old. It is not intended to diagnose infection nor to guide or monitor treatment.   Culture, respiratory (NON-Expectorated)     Status: None (Preliminary result)   Collection Time: 02/05/17  9:44 AM  Result Value Ref Range Status   Specimen Description TRACHEAL ASPIRATE  Final   Special Requests Normal  Final   Gram Stain    Final    ABUNDANT WBC PRESENT,BOTH PMN AND MONONUCLEAR ABUNDANT GRAM POSITIVE COCCI IN PAIRS MODERATE GRAM NEGATIVE COCCI IN PAIRS FEW GRAM NEGATIVE RODS    Culture ABUNDANT STREPTOCOCCUS PNEUMONIAE  Final   Report Status PENDING  Incomplete    Medical History: Past Medical History:  Diagnosis Date  . [name]    a. LHC 02/03/17 for unstable angina showed severe 3v CAD w/ sequential 70% & 80% p-mLAD, 99% mLCx, CTO mRCA, elevated LV filling pressure  . Colon polyp 2010  . Diabetes mellitus with complication ([place])   . Glaucoma   . Hypertension     Assessment: Patient growing strep pneumo in trach aspirate from 2/15, now treating for CAP. Previously not on any abx except pre/post op. WBC 13.8, afebrile.   Plan:  -CTX 1g Q24H -F/u LOT, cultures, clinical progress -Pharmacy to sign off as no renal adjustment needed  Myer Peer Grayland Ormond), PharmD  [id number] Pharmacy Resident Pager: [phone] 02/07/2017 8:14 AM

## 2017-02-07 NOTE — Progress Notes (Signed)
2 Days Post-Op Procedure(s) (LRB): CORONARY ARTERY BYPASS GRAFTING (CABG) x 4                                                                               LIMA-LAD SEQ SVG-DIAG-OM SVG-PD (N/A) TRANSESOPHAGEAL ECHOCARDIOGRAM (TEE) (N/A) MITRAL VALVE REPLACEMENT (MVR) WITH MAGNA MITRAL EASE PERICARDIAL BIOPROSTHESIS 25 MM (N/A) Subjective:  Remains intubated and sedated.  On milrinone 0.375, levophed 18 mcg, IABP 1:1 with CI 2.22, Co-ox 81.5.  Objective: Vital signs in last 24 hours: Temp:  [97.5 F (36.4 C)-98.6 F (37 C)] 98.1 F (36.7 C) (02/17 0900) Pulse Rate:  [97-218] 179 (02/17 0900) Cardiac Rhythm: Sinus tachycardia (02/17 0600) Resp:  [12-17] 17 (02/17 0900) BP: (73-121)/(42-73) 104/53 (02/17 0900) SpO2:  [98 %-100 %] 100 % (02/17 0900) FiO2 (%):  [50 %] 50 % (02/17 0840) Weight:  [85.1 kg (187 lb 9.8 oz)] 85.1 kg (187 lb 9.8 oz) (02/17 0340)  Hemodynamic parameters for last 24 hours: PAP: (31-47)/(18-31) 36/19 CO:  [3.9 L/min-5.2 L/min] 4.2 L/min CI:  [2.1 L/min/m2-2.8 L/min/m2] 2.2 L/min/m2  Intake/Output from previous day: 02/16 0701 - 02/17 0700 In: 3704.3 [I.V.:3644.3; NG/GT:60] Out: 2070 [Urine:1260; Emesis/NG output:450; Chest Tube:360] Intake/Output this shift: Total I/O In: 341.2 [I.V.:341.2] Out: 155 [Urine:120; Emesis/NG output:20; Chest Tube:15]  General appearance: intubated and sedated Heart: regular rate and rhythm, S1, S2 normal, no murmur, click, rub or gallop Lungs: clear to auscultation bilaterally Abdomen: soft, no bowel sounds Extremities: edema moderate anasarca Wound: dressings dry  Lab Results:  Recent Labs  02/07/17 0454 02/07/17 0456  WBC 14.6* 13.8*  HGB 9.3* 8.7*  HCT 28.9* 27.2*  PLT 93* 76*   BMET:  Recent Labs  02/07/17 0454 02/07/17 0456  NA 141 140  K 3.7 3.6  CL 110 111  CO2 20* 20*  GLUCOSE 164* 169*  BUN 28* 29*  CREATININE 2.98* 2.85*  CALCIUM 7.3* 7.2*    PT/INR:  Recent Labs  02/05/17 1844   LABPROT 21.8*  INR 1.87   ABG    Component Value Date/Time   PHART 7.312 (L) 02/07/2017 0330   HCO3 19.5 (L) 02/07/2017 0330   TCO2 22 02/06/2017 1658   ACIDBASEDEF 5.6 (H) 02/07/2017 0330   O2SAT 81.5 02/07/2017 0330   O2SAT 97.0 02/07/2017 0330   CBG (last 3)   Recent Labs  02/07/17 0624 02/07/17 0741 02/07/17 0845  GLUCAP 150* 132* 120*    Assessment/Plan: S/P Procedure(s) (LRB): CORONARY ARTERY BYPASS GRAFTING (CABG) x 4                                                                               LIMA-LAD SEQ SVG-DIAG-OM SVG-PD (N/A) TRANSESOPHAGEAL ECHOCARDIOGRAM (TEE) (N/A) MITRAL VALVE REPLACEMENT (MVR) WITH MAGNA MITRAL EASE PERICARDIAL BIOPROSTHESIS 25 MM (N/A)  Severe 3-vessel CAD presenting with acute NSTEMI. Initial echo showing severe central MR due to annular dilation. Sudden decompensation  with acute systolic and diastolic heart failure, cardiogenic shock due to ruptured papillary muscle and wide open MR, acute respiratory failure, acute anuric renal failure and shock liver.  Hemodynamics are relatively stable on milrinone and levophed at 17-18 mcg. Good CI and Co-ox. Wean levophed as tolerated. I decreased IABP to 1:2. I don't think I would remove that today on high dose levophed.  Acute renal failure: creat improved today and making adequate urine. Lasix ordered. Weight is 29 lbs over preop.  Shock liver: LFT's and bili improving slowly  Acute respiratory failure: continue vent support for now until more stable and can diurese volume excess.  Thrombocytopenia: most likely secondary to IABP  Expected acute postop blood loss anemia: stable.  Hypomagnesemia: 1.6 and having PVC's so will give 2 g Mag sulfate.  Sputum culture with strep pneumo: started on Ceftriaxone for possible bronchitis. I don't see any signs of pneumonia. Preop urinalysis showed moderate leukocytes and + nitrite. Will culture urine.  DM: start Levemir and SSI. Stop insulin  drip.    LOS: 4 days    Gaye Pollack 02/07/2017

## 2017-02-07 NOTE — Progress Notes (Signed)
Patient ID: [name] [name], female   DOB: [birth year], 82 y.o.   MRN: [id number]   SICU Evening Rounds:   Hemodynamically stable on milrinone, levophed and IABP 1:2 CI = 2.1  Remains sedated on vent  Urine output good with lasix. CT output low  CBC    Component Value Date/Time   WBC 13.8 (H) 02/07/2017 0456   RBC 3.47 (L) 02/07/2017 0456   HGB 8.7 (L) 02/07/2017 0456   HCT 27.2 (L) 02/07/2017 0456   PLT 76 (L) 02/07/2017 0456   MCV 78.4 02/07/2017 0456   MCH 25.1 (L) 02/07/2017 0456   MCHC 32.0 02/07/2017 0456   RDW 19.8 (H) 02/07/2017 0456   LYMPHSABS 2.9 02/04/2017 0211   MONOABS 0.9 02/04/2017 0211   EOSABS 0.3 02/04/2017 0211   BASOSABS 0.0 02/04/2017 0211     BMET    Component Value Date/Time   NA 140 02/07/2017 0456   K 3.6 02/07/2017 0456   CL 111 02/07/2017 0456   CO2 20 (L) 02/07/2017 0456   GLUCOSE 169 (H) 02/07/2017 0456   BUN 29 (H) 02/07/2017 0456   CREATININE 2.85 (H) 02/07/2017 0456   CALCIUM 7.2 (L) 02/07/2017 0456   GFRNONAA 15 (L) 02/07/2017 0456   GFRAA 17 (L) 02/07/2017 0456     A/P:  Stable day today.  Continue current plans. If renal function continues to improve will increase diuresis.

## 2017-02-07 NOTE — Progress Notes (Signed)
PULMONARY / CRITICAL CARE MEDICINE   Name: [name] [name] MRN: [id number] DOB: [birth year]    ADMISSION DATE:  02/03/2017 CONSULTATION DATE:  02/04/2017  REFERRING MD:  Dr. Martinique  CHIEF COMPLAINT:  AMS  HISTORY OF PRESENT ILLNESS:  82 year old female with past medical history as below, which is significant for CAD, diabetes mellitus, and hypertension. She presented to cardiologist to/13 with complaints of chest pain and shortness of breath while doing light housework onset about 3 days prior to arrival. She had an EKG done in the clinic, which was concerning for ischemia. She was referred for urgent cardiac cath which revealed three-vessel CAD. She was admitted in transfer to [facility] for urgent CABG which was scheduled for 2/15. After arrival to [facility] on 2/14 she was noted to be lethargic and hypotensive after doing well all day. She was in cardiogenic shock secondary to coronary artery disease requiring intubation, vasopressors, and emergent blood pressure support with intra-aortic balloon pump. The procedure was without complication, and she was sent to the ICU for recovery. PCCM to assist with ventilator and medical management. Patient then had CABG x 4 and MV replacement on 2/15.    SUBJECTIVE:  Resting on vent , sedation with precedex   Remains on Levophed, off Vasopressin  On IABP 1:1   VITAL SIGNS: BP (!) 104/53   Pulse (!) 179   Temp 98.1 F (36.7 C)   Resp 17   Ht 5\' 6"  (1.676 m)   Wt 187 lb 9.8 oz (85.1 kg)   SpO2 100%   BMI 30.28 kg/m   HEMODYNAMICS: PAP: (31-47)/(18-31) 36/19 CO:  [3.9 L/min-5.2 L/min] 4.2 L/min CI:  [2.1 L/min/m2-2.8 L/min/m2] 2.2 L/min/m2  VENTILATOR SETTINGS: Vent Mode: SIMV;PSV;PRVC FiO2 (%):  [50 %] 50 % Set Rate:  [12 bmp] 12 bmp Vt Set:  [500 mL] 500 mL PEEP:  [5 cmH20] 5 cmH20 Pressure Support:  [10 cmH20] 10 cmH20 Plateau Pressure:  [10 cmH20-22 cmH20] 10 cmH20  INTAKE / OUTPUT: I/O last 3 completed shifts: In:  6563.9 [I.V.:5843.9; Blood:345; NG/GT:60; IV [id number]:287] Out: 8676 [Urine:2550; Emesis/NG output:450; Chest Tube:995]  PHYSICAL EXAMINATION: General:  Elderly female on ventilator. Intubated. Sedated. Neuro:  Sedated. On vent  HEENT:  ETT  Cardiovascular:  RRR, no MRG Lungs:  Good ae. Crackles at bases. (-) wheezing/rhonchi. (+) CT per surgery.  Abdomen:  Soft, nondistended. (-) masses/tenderness. Dec BS Musculoskeletal:  No acute deformity Skin:  Grossly intact. Warm distal extremities.   LABS:  BMET  Recent Labs Lab 02/06/17 0417  02/06/17 1658 02/07/17 0454 02/07/17 0456  NA 144  --  145 141 140  K 4.0  --  3.7 3.7 3.6  CL 116*  --  110 110 111  CO2 20*  --   --  20* 20*  BUN 26*  --  27* 28* 29*  CREATININE 2.63*  < > 3.00* 2.98* 2.85*  GLUCOSE 106*  --  128* 164* 169*  < > = values in this interval not displayed.  Electrolytes  Recent Labs Lab 02/05/17 0930  02/06/17 0417 02/06/17 1636 02/07/17 0454 02/07/17 0456 02/07/17 0843  CALCIUM  --   --  7.5*  --  7.3* 7.2*  --   MG 1.8  < > 1.8 1.8  --   --  1.6*  PHOS 2.6  --   --   --   --   --   --   < > = values in this interval not displayed.  CBC  Recent Labs Lab 02/06/17 1636 02/06/17 1658 02/07/17 0454 02/07/17 0456  WBC 14.6*  --  14.6* 13.8*  HGB 9.5* 10.2* 9.3* 8.7*  HCT 29.0* 30.0* 28.9* 27.2*  PLT 94*  --  93* 76*    Coag's  Recent Labs Lab 02/03/17 1118 02/05/17 0240 02/05/17 1844  APTT  --  61* 83*  INR 1.11  --  1.87    Sepsis Markers  Recent Labs Lab 02/05/17 0835  LATICACIDVEN 1.6    ABG  Recent Labs Lab 02/05/17 1856 02/06/17 0948 02/07/17 0330  PHART 7.504* 7.353 7.312*  PCO2ART 29.6* 36.2 39.8  PO2ART 237.0* 134.0* 102    Liver Enzymes  Recent Labs Lab 02/05/17 0515 02/07/17 0454 02/07/17 0456  AST 527* 243* 211*  ALT 313* 240* 220*  ALKPHOS 91 61 57  BILITOT 1.5* 3.4* 3.0*  ALBUMIN 2.7* 2.4* 2.3*    Cardiac Enzymes  Recent Labs Lab  02/03/17 1118  TROPONINI 6.48*    Glucose  Recent Labs Lab 02/07/17 0122 02/07/17 0337 02/07/17 0533 02/07/17 0624 02/07/17 0741 02/07/17 0845  GLUCAP 133* 159* 150* 150* 132* 120*    Imaging Dg Chest Port 1 View  Result Date: 02/07/2017 CLINICAL DATA:  Status post CABG EXAM: PORTABLE CHEST 1 VIEW COMPARISON:  Yesterday FINDINGS: Endotracheal tube tip between the clavicular heads and carina. Right IJ sheath with tip at the pulmonary artery outflow or main pulmonary artery. Intra-aortic balloon pump which is inflated at time of imaging, marker overlapping the arch. Thoracic drains present. Nasogastric tube reaches the stomach. Unchanged atelectasis. No pulmonary edema or pneumothorax noted. Stable cardiomegaly after CABG and mitral valve replacement. IMPRESSION: 1. Stable positioning of tubes and lines as described. 2. Unchanged atelectasis.  No edema or visible pneumothorax. Electronically Signed   By: Monte Fantasia M.D.   On: 02/07/2017 07:25     STUDIES:  LHC 2/13 > significant three-vessel coronary artery disease, including sequential 70% 80% proximal and mid LAD stenoses, mid left circumflex lesion, and chronic subtotal occlusion of the mid RCA. Echo 2/13 > LVEF 40-45%, hypokinesis of the anterior myocardium, akinesis of the anterolateral and inferior lateral myocardium. Grade 2 diastolic dysfunction.  CULTURES: MRSA 2/14 > (-) Trache asp 2/14 >abund strep pneum>   ANTIBIOTICS: Cefuroxime 2/14 > 2/15 Vanc 2/14 > 2/15 2/17 Rocephin >>  SIGNIFICANT EVENTS: 2/14 admit for urgent CABG but pt in cardiogenic shock. Intubated.  2/15 CABG x 4, MV replacement   LINES/TUBES: L fem art sheath 2/14 > L IJ Swan 2/14 > L radial art line 2/15 >    DISCUSSION: 9F, with severe 3VD, in cardiogenic shock.  Initial plan was for CABG but it is on hold as she is too sick for CABG. Intubated. With IABP. On pressors. Patient subsequently had CABG x 4 and MV replacement on 2/15.    ASSESSMENT / PLAN:  PULMONARY A: Acute hypoxemic respiratory failure secondary to acute systolic heart failure exacerbation/pulmonary edema + CAD/ischemia + S/P CABG 2/15 Strep Pneum +Bronchitis /PNA   P:   Full vent support.  No weaning for now.  Lasix per Cards.  Vap prevention bundle Cont IV abx, w/ Rocephin   CARDIOVASCULAR A:  [name], MV replacement 2/15 Cardiogenic shock secondary to acute heart failure/CAD/Ischemia Three-vessel CAD, severe P:  Cont vent support Per TCVS/cardiology MAP goal greater than 65 mmHg On levophed drip Cont IABP Heparin drip was discontinued with CABG Diuresis per CTS    RENAL A:   Acute  kidney injury  P:   Follow BMP Strict I&O Making urine.  Agree with diuresis (gentle).    GASTROINTESTINAL A:   No acute issues  P:   NPO  Protonix IV for stress ulcer prophylaxis   HEMATOLOGIC A:   Anemia, uncertain chronicity P:  Follow CBC Heparin infusion on hold with CABG.    INFECTIOUS A:   Strep Pneumonia Sputum ?Bronchitis/CAP  P:   Follow WBC and fever curve  Rocephin 2/17     ENDOCRINE A:   Diabetes mellitus P:   CBG monitoring  Insulin infusion per primary   NEUROLOGIC A:   Acute metabolic encephalopathy in the setting of shock P:   RASS goal: -1 to -2 Precedex drip (came with it post op) Fentanyl pushes and Versed pushes.  She appears comfortable.    FAMILY  - Updates: Family updated by cardiology on 2/14.  No family at bedside on 2/15.   - Inter-disciplinary family meet or Palliative Care meeting due by:  2/20  Rahaf Carbonell NP-C  [facility]  [phone]   02/07/2017

## 2017-02-08 ENCOUNTER — Inpatient Hospital Stay (HOSPITAL_COMMUNITY): Payer: Medicare Other

## 2017-02-08 DIAGNOSIS — J13 Pneumonia due to Streptococcus pneumoniae: Secondary | ICD-10-CM

## 2017-02-08 LAB — CBC
HCT: 28.3 % — ABNORMAL LOW (ref 36.0–46.0)
Hemoglobin: 9 g/dL — ABNORMAL LOW (ref 12.0–15.0)
MCH: 24.8 pg — ABNORMAL LOW (ref 26.0–34.0)
MCHC: 31.8 g/dL (ref 30.0–36.0)
MCV: 78 fL (ref 78.0–100.0)
PLATELETS: 78 10*3/uL — AB (ref 150–400)
RBC: 3.63 MIL/uL — ABNORMAL LOW (ref 3.87–5.11)
RDW: 20.1 % — AB (ref 11.5–15.5)
WBC: 14.4 10*3/uL — ABNORMAL HIGH (ref 4.0–10.5)

## 2017-02-08 LAB — TYPE AND SCREEN
BLOOD PRODUCT EXPIRATION DATE: 201803152359
Blood Product Expiration Date: 201803152359
Blood Product Expiration Date: 201803152359
Blood Product Expiration Date: 201803152359
ISSUE DATE / TIME: 201802151124
ISSUE DATE / TIME: 201802151124
ISSUE DATE / TIME: 201802160145
ISSUE DATE / TIME: 201802151124
UNIT TYPE AND RH: 5100
UNIT TYPE AND RH: 5100
Unit Type and Rh: 5100
Unit Type and Rh: 5100

## 2017-02-08 LAB — COOXEMETRY PANEL
CARBOXYHEMOGLOBIN: 1 % (ref 0.5–1.5)
METHEMOGLOBIN: 1.2 % (ref 0.0–1.5)
O2 Saturation: 68.5 %
Total hemoglobin: 8.8 g/dL — ABNORMAL LOW (ref 12.0–16.0)

## 2017-02-08 LAB — GLUCOSE, CAPILLARY
GLUCOSE-CAPILLARY: 111 mg/dL — AB (ref 65–99)
GLUCOSE-CAPILLARY: 106 mg/dL — AB (ref 65–99)
Glucose-Capillary: 102 mg/dL — ABNORMAL HIGH (ref 65–99)
Glucose-Capillary: 92 mg/dL (ref 65–99)
Glucose-Capillary: 160 mg/dL — ABNORMAL HIGH (ref 65–99)
Glucose-Capillary: 61 mg/dL — ABNORMAL LOW (ref 65–99)

## 2017-02-08 LAB — URINE CULTURE: Culture: NO GROWTH

## 2017-02-08 LAB — COMPREHENSIVE METABOLIC PANEL
ALBUMIN: 2.2 g/dL — AB (ref 3.5–5.0)
ALT: 176 U/L — ABNORMAL HIGH (ref 14–54)
ANION GAP: 9 (ref 5–15)
AST: 112 U/L — ABNORMAL HIGH (ref 15–41)
Alkaline Phosphatase: 66 U/L (ref 38–126)
BUN: 32 mg/dL — ABNORMAL HIGH (ref 6–20)
CHLORIDE: 111 mmol/L (ref 101–111)
CO2: 22 mmol/L (ref 22–32)
Calcium: 7.3 mg/dL — ABNORMAL LOW (ref 8.9–10.3)
Creatinine, Ser: 2.78 mg/dL — ABNORMAL HIGH (ref 0.44–1.00)
GFR calc Af Amer: 18 mL/min — ABNORMAL LOW (ref >60)
GFR calc non Af Amer: 15 mL/min — ABNORMAL LOW (ref >60)
GLUCOSE: 109 mg/dL — AB (ref 65–99)
POTASSIUM: 3.6 mmol/L (ref 3.5–5.1)
SODIUM: 142 mmol/L (ref 135–145)
Total Bilirubin: 3.4 mg/dL — ABNORMAL HIGH (ref 0.3–1.2)
Total Protein: 4.5 g/dL — ABNORMAL LOW (ref 6.5–8.1)

## 2017-02-08 LAB — MAGNESIUM: Magnesium: 1.9 mg/dL (ref 1.7–2.4)

## 2017-02-08 MED ORDER — DEXTROSE 50 % IV SOLN
INTRAVENOUS | Status: AC
  Administered 2017-02-08: 25 mL
  Filled 2017-02-08: qty 50

## 2017-02-08 MED ORDER — FAMOTIDINE IN NACL 20-0.9 MG/50ML-% IV SOLN
20.0000 mg | INTRAVENOUS | Status: DC
  Administered 2017-02-09 – 2017-02-12 (×4): 20 mg via INTRAVENOUS
  Filled 2017-02-08 (×4): qty 50

## 2017-02-08 MED ORDER — POTASSIUM CHLORIDE 20 MEQ PO PACK: 20.0000 meq | PACK | Freq: Two times a day (BID) | ORAL | Status: DC

## 2017-02-08 MED ORDER — FUROSEMIDE 10 MG/ML IJ SOLN
60.0000 mg | Freq: Two times a day (BID) | INTRAMUSCULAR | Status: DC
  Administered 2017-02-08 – 2017-02-09 (×3): 60 mg via INTRAVENOUS
  Filled 2017-02-08 (×3): qty 6

## 2017-02-08 MED ORDER — POTASSIUM CHLORIDE 20 MEQ PO PACK
20.0000 meq | PACK | Freq: Once | ORAL | Status: DC
Start: 2017-02-08 — End: 2017-02-08
  Filled 2017-02-08: qty 1

## 2017-02-08 MED ORDER — POTASSIUM CHLORIDE 2 MEQ/ML IV SOLN
30.0000 meq | Freq: Once | INTRAVENOUS | Status: AC
  Administered 2017-02-08: 30 meq via INTRAVENOUS
  Filled 2017-02-08: qty 15

## 2017-02-08 NOTE — Progress Notes (Signed)
PULMONARY / CRITICAL CARE MEDICINE   Name: MONICK RENA MRN: [id number] DOB: 04-Dec-1935    ADMISSION DATE:  02/03/2017 CONSULTATION DATE:  02/04/2017  REFERRING MD:  Dr. Martinique  CHIEF COMPLAINT:  AMS  HISTORY OF PRESENT ILLNESS:  82 year old female with past medical history as below, which is significant for CAD, diabetes mellitus, and hypertension. She presented to cardiologist to/13 with complaints of chest pain and shortness of breath while doing light housework onset about 3 days prior to arrival. She had an EKG done in the clinic, which was concerning for ischemia. She was referred for urgent cardiac cath which revealed three-vessel CAD. She was admitted in transfer to [facility] for urgent CABG which was scheduled for 2/15. After arrival to [facility] on 2/14 she was noted to be lethargic and hypotensive after doing well all day. She was in cardiogenic shock secondary to coronary artery disease requiring intubation, vasopressors, and emergent blood pressure support with intra-aortic balloon pump. The procedure was without complication, and she was sent to the ICU for recovery. PCCM to assist with ventilator and medical management. Patient then had CABG x 4 and MV replacement on 2/15.    SUBJECTIVE:  Resting on vent , sedation with precedex  , woke up for RN this morning , followed simple commans. Levophed dose decreased this am   VITAL SIGNS: BP 102/69   Pulse 99   Temp 97.9 F (36.6 C)   Resp 12   Ht 5\' 6"  (1.676 m)   Wt 178 lb 9.2 oz (81 kg)   SpO2 100%   BMI 28.82 kg/m   HEMODYNAMICS: PAP: (27-42)/(14-25) 28/14 CO:  [3.7 L/min-4.9 L/min] 3.7 L/min CI:  [2 L/min/m2-2.6 L/min/m2] 2 L/min/m2  VENTILATOR SETTINGS: Vent Mode: SIMV;PSV FiO2 (%):  [40 %-50 %] 40 % Set Rate:  [12 bmp] 12 bmp Vt Set:  [500 mL] 500 mL PEEP:  [5 cmH20] 5 cmH20 Pressure Support:  [10 cmH20] 10 cmH20 Plateau Pressure:  [13 cmH20-20 cmH20] 20 cmH20  INTAKE / OUTPUT: I/O last 3  completed shifts: In: 4016.6 [I.V.:3846.6; NG/GT:120; IV Piggyback:50] Out: 3290 [Urine:2355; Emesis/NG output:470; Chest Tube:465]  PHYSICAL EXAMINATION: General:  Elderly female on ventilator. Intubated. Sedated. Neuro:  Sedated. On vent  HEENT:  ETT  Cardiovascular:  RRR, no MRG Lungs:  Good ae. Crackles at bases. (-) wheezing/rhonchi. (+) CT per surgery.  Abdomen:  Soft, nondistended. (-) masses/tenderness. Dec BS Musculoskeletal:  No acute deformity Skin:  Grossly intact. Warm distal extremities.   LABS:  BMET  Recent Labs Lab 02/07/17 0454 02/07/17 0456 02/08/17 0334  NA 141 140 142  K 3.7 3.6 3.6  CL 110 111 111  CO2 20* 20* 22  BUN 28* 29* 32*  CREATININE 2.98* 2.85* 2.78*  GLUCOSE 164* 169* 109*    Electrolytes  Recent Labs Lab 02/05/17 0930  02/06/17 1636 02/07/17 0454 02/07/17 0456 02/07/17 0843 02/08/17 0334  CALCIUM  --   < >  --  7.3* 7.2*  --  7.3*  MG 1.8  < > 1.8  --   --  1.6* 1.9  PHOS 2.6  --   --   --   --   --   --   < > = values in this interval not displayed.  CBC  Recent Labs Lab 02/07/17 0454 02/07/17 0456 02/08/17 0334  WBC 14.6* 13.8* 14.4*  HGB 9.3* 8.7* 9.0*  HCT 28.9* 27.2* 28.3*  PLT 93* 76* 78*    Coag's  Recent  Labs Lab 02/03/17 1118 02/05/17 0240 02/05/17 1844  APTT  --  61* 83*  INR 1.11  --  1.87    Sepsis Markers  Recent Labs Lab 02/05/17 0835  LATICACIDVEN 1.6    ABG  Recent Labs Lab 02/05/17 1856 02/06/17 0948 02/07/17 0330  PHART 7.504* 7.353 7.312*  PCO2ART 29.6* 36.2 39.8  PO2ART 237.0* 134.0* 102    Liver Enzymes  Recent Labs Lab 02/07/17 0454 02/07/17 0456 02/08/17 0334  AST 243* 211* 112*  ALT 240* 220* 176*  ALKPHOS 61 57 66  BILITOT 3.4* 3.0* 3.4*  ALBUMIN 2.4* 2.3* 2.2*    Cardiac Enzymes  Recent Labs Lab 02/03/17 1118  TROPONINI 6.48*    Glucose  Recent Labs Lab 02/07/17 0952 02/07/17 1221 02/07/17 1610 02/07/17 1931 02/07/17 2327 02/08/17 0306   GLUCAP 128* 122* 129* 116* 103* 102*    Imaging Dg Chest Port 1 View  Result Date: 02/08/2017 CLINICAL DATA:  Coronary disease post CABG, history hypertension, diabetes mellitus EXAM: PORTABLE CHEST 1 VIEW COMPARISON:  Portable exam 0544 hours compared to 02/07/2017 FINDINGS: Tip of endotracheal tube projects 10 mm above carina. Nasogastric tube extends into stomach. Epicardial pacing wires present. BILATERAL thoracostomy tubes and mediastinal drains present. RIGHT jugular Swan-Ganz catheter with tip projecting over main pulmonary artery at bifurcation. Tip of intra-aortic balloon pump projects at the inferior aspect of aortic arch slightly lower than on the previous study. Enlargement of cardiac silhouette post CABG and MVR. Pulmonary vascular congestion. Bibasilar atelectasis greater on LEFT. No pneumothorax.  All IMPRESSION: Stable postoperative changes as above. Electronically Signed   By: Lavonia Dana M.D.   On: 02/08/2017 07:20     STUDIES:  LHC 2/13 > significant three-vessel coronary artery disease, including sequential 70% 80% proximal and mid LAD stenoses, mid left circumflex lesion, and chronic subtotal occlusion of the mid RCA. Echo 2/13 > LVEF 40-45%, hypokinesis of the anterior myocardium, akinesis of the anterolateral and inferior lateral myocardium. Grade 2 diastolic dysfunction.  CULTURES: MRSA 2/14 > (-) Trache asp 2/14 >abund strep pneum>ceftriaxone sens    ANTIBIOTICS: Cefuroxime 2/14 > 2/15 Vanc 2/14 > 2/15 2/17 Rocephin >>  SIGNIFICANT EVENTS: 2/14 admit for urgent CABG but pt in cardiogenic shock. Intubated.  2/15 CABG x 4, MV replacement   LINES/TUBES: L fem art sheath 2/14 > L IJ Swan 2/14 > L radial art line 2/15 >    DISCUSSION: 69F, with severe 3VD, in cardiogenic shock.  Initial plan was for CABG but it is on hold as she is too sick for CABG. Intubated. With IABP. On pressors. Patient subsequently had CABG x 4 and MV replacement on 2/15.    ASSESSMENT / PLAN:  PULMONARY A: Acute hypoxemic respiratory failure secondary to acute systolic heart failure exacerbation/pulmonary edema + CAD/ischemia + S/P CABG 2/15 Strep Pneum +Bronchitis /PNA   P:   Full vent support.  No weaning for now.  Lasix per Cards.  Vap prevention bundle Cont IV abx, w/ Rocephin   CARDIOVASCULAR A:  [name], MV replacement 2/15 Cardiogenic shock secondary to acute heart failure/CAD/Ischemia Three-vessel CAD, severe P:  Cont vent support Per TCVS/cardiology MAP goal greater than 65 mmHg On levophed drip Cont IABP Heparin drip was discontinued with CABG Diuresis per CTS    RENAL A:   Acute kidney injury hypomag   P:   Follow BMP Strict I&O Making urine.  Agree with diuresis (gentle).    GASTROINTESTINAL A:   Nutrition   P:  NPO  Protonix IV for stress ulcer prophylaxis Discuss TF initiation w/ CTS    HEMATOLOGIC A:   Anemia, uncertain chronicity P:  Follow CBC Heparin infusion on hold with CABG.    INFECTIOUS A:   Strep Pneumonia Sputum ?Bronchitis/CAP  P:   Follow WBC and fever curve  Rocephin 2/17     ENDOCRINE A:   Diabetes mellitus P:   CBG monitoring  Insulin infusion per primary   NEUROLOGIC A:   Acute metabolic encephalopathy in the setting of shock P:   RASS goal: -1 to -2 Precedex drip (came with it post op) Fentanyl pushes and Versed pushes.  She appears comfortable.    FAMILY  - Updates: Family updated by cardiology on 2/14.  No family at bedside on 2/15.   - Inter-disciplinary family meet or Palliative Care meeting due by:  2/20  Tammy Parrett NP-C  [facility]  [phone]   02/08/2017

## 2017-02-08 NOTE — Progress Notes (Signed)
3 Days Post-Op Procedure(s) (LRB): CORONARY ARTERY BYPASS GRAFTING (CABG) x 4                                                                               LIMA-LAD SEQ SVG-DIAG-OM SVG-PD (N/A) TRANSESOPHAGEAL ECHOCARDIOGRAM (TEE) (N/A) MITRAL VALVE REPLACEMENT (MVR) WITH MAGNA MITRAL EASE PERICARDIAL BIOPROSTHESIS 25 MM (N/A) Subjective: Intubated and sedated but according to nurse has woken up this am and following commands  Objective: Vital signs in last 24 hours: Temp:  [97.3 F (36.3 C)-100.2 F (37.9 C)] 97.9 F (36.6 C) (02/18 0845) Pulse Rate:  [50-179] 96 (02/18 0845) Cardiac Rhythm: Sinus tachycardia (02/18 0800) Resp:  [12-23] 12 (02/18 0845) BP: (84-137)/(47-91) 113/60 (02/18 0815) SpO2:  [99 %-100 %] 100 % (02/18 0845) FiO2 (%):  [40 %-50 %] 40 % (02/18 0800) Weight:  [81 kg (178 lb 9.2 oz)] 81 kg (178 lb 9.2 oz) (02/18 0500)  Hemodynamic parameters for last 24 hours: PAP: (27-42)/(14-25) 27/15 CO:  [3.7 L/min-4.9 L/min] 3.9 L/min CI:  [2 L/min/m2-2.6 L/min/m2] 2.1 L/min/m2  Intake/Output from previous day: 02/17 0701 - 02/18 0700 In: 2026.4 [I.V.:1916.4; NG/GT:60; IV Piggyback:50] Out: 2495 [Urine:1800; Emesis/NG output:420; Chest Tube:275] Intake/Output this shift: Total I/O In: 142.2 [I.V.:62.2; NG/GT:30; IV Piggyback:50] Out: 245 [Urine:225; Chest Tube:20]  Heart: regular rate and rhythm, S1, S2 normal, no murmur, click, rub or gallop Lungs: clear to auscultation bilaterally Abdomen: soft, non-tender; bowel sounds absent Extremities: edema moderate Wound: dressing dry  Lab Results:  Recent Labs  02/07/17 0456 02/08/17 0334  WBC 13.8* 14.4*  HGB 8.7* 9.0*  HCT 27.2* 28.3*  PLT 76* 78*   BMET:  Recent Labs  02/07/17 0456 02/08/17 0334  NA 140 142  K 3.6 3.6  CL 111 111  CO2 20* 22  GLUCOSE 169* 109*  BUN 29* 32*  CREATININE 2.85* 2.78*  CALCIUM 7.2* 7.3*    PT/INR:  Recent Labs  02/05/17 1844  LABPROT 21.8*  INR 1.87   ABG     Component Value Date/Time   PHART 7.312 (L) 02/07/2017 0330   HCO3 19.5 (L) 02/07/2017 0330   TCO2 22 02/06/2017 1658   ACIDBASEDEF 5.6 (H) 02/07/2017 0330   O2SAT 68.5 02/08/2017 0330   CBG (last 3)   Recent Labs  02/07/17 1931 02/07/17 2327 02/08/17 0306  GLUCAP 116* 103* 102*   CLINICAL DATA:  Coronary disease post CABG, history hypertension, diabetes mellitus  EXAM: PORTABLE CHEST 1 VIEW  COMPARISON:  Portable exam 0544 hours compared to 02/07/2017  FINDINGS: Tip of endotracheal tube projects 10 mm above carina.  Nasogastric tube extends into stomach.  Epicardial pacing wires present.  BILATERAL thoracostomy tubes and mediastinal drains present.  RIGHT jugular Swan-Ganz catheter with tip projecting over main pulmonary artery at bifurcation.  Tip of intra-aortic balloon pump projects at the inferior aspect of aortic arch slightly lower than on the previous study.  Enlargement of cardiac silhouette post CABG and MVR.  Pulmonary vascular congestion.  Bibasilar atelectasis greater on LEFT.  No pneumothorax.  All  IMPRESSION: Stable postoperative changes as above.   Electronically Signed   By: Lavonia Dana M.D.   On: 02/08/2017 07:20  Assessment/Plan: S/P Procedure(s) (  LRB): CORONARY ARTERY BYPASS GRAFTING (CABG) x 4                                                                               LIMA-LAD SEQ SVG-DIAG-OM SVG-PD (N/A) TRANSESOPHAGEAL ECHOCARDIOGRAM (TEE) (N/A) MITRAL VALVE REPLACEMENT (MVR) WITH MAGNA MITRAL EASE PERICARDIAL BIOPROSTHESIS 25 MM (N/A)  Severe 3-vessel CAD presenting with acute NSTEMI. Initial echo showing severe central MR due to annular dilation. Sudden decompensation with acute systolic and diastolic heart failure, cardiogenic shock due to ruptured papillary muscle and wide open MR, acute respiratory failure, acute anuric renal failure and shock liver.  Hemodynamics are relatively stable on milrinone and  levophed which is down to 10 mcg.  Good CI and Co-ox 68.5. Wean levophed as tolerated. I decreased IABP to 1:3.  Will see how she does and consider removing later today.  Acute renal failure: creat improved today and making adequate urine. Lasix ordered. Weight is 9 lbs less than yesterday but I doubt that is accurate.  Shock liver: LFT's and bili improving slowly  Acute respiratory failure: continue vent support for now until more stable and can diurese volume excess.  Thrombocytopenia: most likely secondary to IABP  Expected acute postop blood loss anemia: stable.  Hypomagnesemia: given some yesterday and will repeat in am.  Sputum culture with strep pneumo: started on Ceftriaxone for possible bronchitis. I don't see any signs of pneumonia. Preop urinalysis showed moderate leukocytes and + nitrite. Culture pending.  DM: start Levemir and SSI. Stop insulin drip.   LOS: 5 days    Gaye Pollack 02/08/2017

## 2017-02-08 NOTE — Progress Notes (Signed)
Patient ID: [name] [name], female   DOB: 26-Jul-1935, 82 y.o.   MRN: [id number]  SICU Evening Rounds:   Hemodynamically stable  CI = 2 on milrinone 0.375 and levophed 9 for BP, IABP 1:2  Sedated on vent  Urine output good with lasix CT output low  CBC    Component Value Date/Time   WBC 14.4 (H) 02/08/2017 0334   RBC 3.63 (L) 02/08/2017 0334   HGB 9.0 (L) 02/08/2017 0334   HCT 28.3 (L) 02/08/2017 0334   PLT 78 (L) 02/08/2017 0334   MCV 78.0 02/08/2017 0334   MCH 24.8 (L) 02/08/2017 0334   MCHC 31.8 02/08/2017 0334   RDW 20.1 (H) 02/08/2017 0334   LYMPHSABS 2.9 02/04/2017 0211   MONOABS 0.9 02/04/2017 0211   EOSABS 0.3 02/04/2017 0211   BASOSABS 0.0 02/04/2017 0211     BMET    Component Value Date/Time   NA 142 02/08/2017 0334   K 3.6 02/08/2017 0334   CL 111 02/08/2017 0334   CO2 22 02/08/2017 0334   GLUCOSE 109 (H) 02/08/2017 0334   BUN 32 (H) 02/08/2017 0334   CREATININE 2.78 (H) 02/08/2017 0334   CALCIUM 7.3 (L) 02/08/2017 0334   GFRNONAA 15 (L) 02/08/2017 0334   GFRAA 18 (L) 02/08/2017 0334     A/P:  Stable,  Continue current plans

## 2017-02-08 NOTE — Progress Notes (Addendum)
Patient ID: [name] [name], female   DOB: 31-Jan-1935, 82 y.o.   MRN: [id number]     Advanced Heart Failure Rounding Note  PCP:  Primary Cardiologist:   Subjective:    S/p emergent CABG x 4 and bioprosthetic MVR for LCX NSTEMI with flail MV  Remains intubated and sedated. On IABP with 1:2 support. Currently on milrinone 0.375 mcg/kg/min and norepi 10, off vasopressin.  UOP 1800 cc.   Swan numbers:  PAP 30/15 CI 2  Co-ox 69%  Objective:   Weight Range: 178 lb 9.2 oz (81 kg) Body mass index is 28.82 kg/m.   Vital Signs:   Temp:  [97.3 F (36.3 C)-100.2 F (37.9 C)] 97.3 F (36.3 C) (02/18 0700) Pulse Rate:  [50-179] 95 (02/18 0700) Resp:  [12-23] 12 (02/18 0700) BP: (84-130)/(47-91) 119/72 (02/18 0700) SpO2:  [99 %-100 %] 100 % (02/18 0700) FiO2 (%):  [40 %-50 %] 40 % (02/18 0400) Weight:  [178 lb 9.2 oz (81 kg)] 178 lb 9.2 oz (81 kg) (02/18 0500) Last BM Date:  (Prior to surgery)  Weight change: Filed Weights   02/06/17 0500 02/07/17 0340 02/08/17 0500  Weight: 187 lb 2.7 oz (84.9 kg) 187 lb 9.8 oz (85.1 kg) 178 lb 9.2 oz (81 kg)    Intake/Output:   Intake/Output Summary (Last 24 hours) at 02/08/17 0747 Last data filed at 02/08/17 0700  Gross per 24 hour  Intake           2026.4 ml  Output             2495 ml  Net           -468.6 ml     Physical Exam: General:  Intubated and sedated.  HEENT: + ETT Neck: supple. RIJ swan Carotids 2+ bilat; no bruits. No lymphadenopathy or thyromegaly appreciated. Cor: PMI nondisplaced. RRR. No M/G/R noted.  Pedal pulses dopplerable.  Lungs: Scattered rhonchi bilaterally.  Abdomen: soft, ND, no HSM. No bruits or masses. +BS  Extremities: no cyanosis, clubbing, rash. Trace ankle edema. R groin IABP.  Neuro: Intubated and sedated.   Telemetry: Reviewed, NSR  Labs: CBC  Recent Labs  02/07/17 0456 02/08/17 0334  WBC 13.8* 14.4*  HGB 8.7* 9.0*  HCT 27.2* 28.3*  MCV 78.4 78.0  PLT 76* 78*   Basic Metabolic  Panel  Recent Labs  02/05/17 0930  02/07/17 0456 02/07/17 0843 02/08/17 0334  NA  --   < > 140  --  142  K  --   < > 3.6  --  3.6  CL  --   < > 111  --  111  CO2  --   < > 20*  --  22  GLUCOSE  --   < > 169*  --  109*  BUN  --   < > 29*  --  32*  CREATININE  --   < > 2.85*  --  2.78*  CALCIUM  --   < > 7.2*  --  7.3*  MG 1.8  < >  --  1.6* 1.9  PHOS 2.6  --   --   --   --   < > = values in this interval not displayed. Liver Function Tests  Recent Labs  02/07/17 0456 02/08/17 0334  AST 211* 112*  ALT 220* 176*  ALKPHOS 57 66  BILITOT 3.0* 3.4*  PROT 3.8* 4.5*  ALBUMIN 2.3* 2.2*   No results for input(s): LIPASE, AMYLASE in the  last 72 hours. Cardiac Enzymes No results for input(s): CKTOTAL, CKMB, CKMBINDEX, TROPONINI in the last 72 hours.  BNP: BNP (last 3 results) No results for input(s): BNP in the last 8760 hours.  ProBNP (last 3 results) No results for input(s): PROBNP in the last 8760 hours.   D-Dimer No results for input(s): DDIMER in the last 72 hours. Hemoglobin A1C No results for input(s): HGBA1C in the last 72 hours. Fasting Lipid Panel No results for input(s): CHOL, HDL, LDLCALC, TRIG, CHOLHDL, LDLDIRECT in the last 72 hours. Thyroid Function Tests No results for input(s): TSH, T4TOTAL, T3FREE, THYROIDAB in the last 72 hours.  Invalid input(s): FREET3  Other results:     Imaging/Studies:  Dg Chest Port 1 View  Result Date: 02/08/2017 CLINICAL DATA:  Coronary disease post CABG, history hypertension, diabetes mellitus EXAM: PORTABLE CHEST 1 VIEW COMPARISON:  Portable exam 0544 hours compared to 02/07/2017 FINDINGS: Tip of endotracheal tube projects 10 mm above carina. Nasogastric tube extends into stomach. Epicardial pacing wires present. BILATERAL thoracostomy tubes and mediastinal drains present. RIGHT jugular Swan-Ganz catheter with tip projecting over main pulmonary artery at bifurcation. Tip of intra-aortic balloon pump projects at the  inferior aspect of aortic arch slightly lower than on the previous study. Enlargement of cardiac silhouette post CABG and MVR. Pulmonary vascular congestion. Bibasilar atelectasis greater on LEFT. No pneumothorax.  All IMPRESSION: Stable postoperative changes as above. Electronically Signed   By: Lavonia Dana M.D.   On: 02/08/2017 07:20      Medications:     Scheduled Medications: . sodium chloride   Intravenous Once  . aspirin EC  325 mg Oral Daily   Or  . aspirin  324 mg Per Tube Daily  . atorvastatin  80 mg Per Tube q1800  . cefTRIAXone (ROCEPHIN)  IV  1 g Intravenous Q24H  . chlorhexidine gluconate (MEDLINE KIT)  15 mL Mouth Rinse BID  . famotidine (PEPCID) IV  20 mg Intravenous Q12H  . furosemide  60 mg Intravenous BID  . insulin aspart  0-15 Units Subcutaneous Q4H  . insulin detemir  20 Units Subcutaneous Daily  . mouth rinse  15 mL Mouth Rinse QID  . potassium chloride  20 mEq Oral Once  . sodium chloride flush  3 mL Intravenous Q12H    Infusions: . sodium chloride 10 mL/hr at 02/08/17 0700  . sodium chloride    . sodium chloride 10 mL/hr at 02/08/17 0600  . dexmedetomidine 0.7 mcg/kg/hr (02/08/17 0700)  . lactated ringers Stopped (02/06/17 0100)  . lactated ringers 10 mL/hr at 02/08/17 0700  . milrinone 0.375 mcg/kg/min (02/08/17 0700)  . nitroGLYCERIN Stopped (02/05/17 2300)  . norepinephrine (LEVOPHED) Adult infusion 11 mcg/min (02/08/17 0700)    PRN Medications: sodium chloride, fentaNYL (SUBLIMAZE) injection, midazolam, morphine injection, ondansetron (ZOFRAN) IV, sodium chloride flush   Assessment/Plan   1. CAD [name] 2. Severe MR with flail segment s/p MVR c/ bioprosthetic valve. 3. Cardiogenic shock: Echo pre-op with EF 40-45%, lateral akinesis, severe MR.  4. AKI 5. Shock Liver 6. Acute respiratory failure with hypoxemia requiring intubation 7. Thrombocytopenia: Post-op. 8. Tracheal aspirate with Strep pneumoniae  Remains intubated and sedated  this am. Management per CCM.  Chest tubes in place.    Co-ox/CI adequate.  Weaning down slowly on norepinephrine, continue to work on getting this off today.  Continue milrinone 0.375 mcg/kg/min for now.   Continue IABP at 1:2, if she tolerates coming off norepinephrine could probably remove.  Site  ok, pulses dopplerable.  Platelets low but stable.   Hgb higher.   PA pressure not high but weight up a lot compared to pre-op.  Creatinine down a bit today.  Would give Lasix 60 mg IV bid today.   Tracheal aspirate with Strep pneumo, CXR with atelectasis.  Afebrile, WBCs 14.  Treating for tracheobronchitis with ceftriaxone.    35 minutes critical care time.   Theador Hawthorne 7:47 AM  02/08/2017

## 2017-02-09 ENCOUNTER — Inpatient Hospital Stay (HOSPITAL_COMMUNITY): Payer: Medicare Other

## 2017-02-09 LAB — POCT I-STAT 4, (NA,K, GLUC, HGB,HCT)
GLUCOSE: 127 mg/dL — AB (ref 65–99)
HEMATOCRIT: 25 % — AB (ref 36.0–46.0)
HEMOGLOBIN: 8.5 g/dL — AB (ref 12.0–15.0)
POTASSIUM: 3.8 mmol/L (ref 3.5–5.1)
Sodium: 145 mmol/L (ref 135–145)

## 2017-02-09 LAB — GLUCOSE, CAPILLARY
GLUCOSE-CAPILLARY: 62 mg/dL — AB (ref 65–99)
Glucose-Capillary: 91 mg/dL (ref 65–99)
Glucose-Capillary: 82 mg/dL (ref 65–99)
Glucose-Capillary: 89 mg/dL (ref 65–99)
Glucose-Capillary: 102 mg/dL — ABNORMAL HIGH (ref 65–99)
Glucose-Capillary: 104 mg/dL — ABNORMAL HIGH (ref 65–99)

## 2017-02-09 LAB — POCT I-STAT, CHEM 8
BUN: 36 mg/dL — ABNORMAL HIGH (ref 6–20)
Calcium, Ion: 1.15 mmol/L (ref 1.15–1.40)
Chloride: 109 mmol/L (ref 101–111)
Creatinine, Ser: 2.4 mg/dL — ABNORMAL HIGH (ref 0.44–1.00)
Glucose, Bld: 103 mg/dL — ABNORMAL HIGH (ref 65–99)
HEMATOCRIT: 24 % — AB (ref 36.0–46.0)
HEMOGLOBIN: 8.2 g/dL — AB (ref 12.0–15.0)
POTASSIUM: 3.5 mmol/L (ref 3.5–5.1)
SODIUM: 144 mmol/L (ref 135–145)
TCO2: 24 mmol/L (ref 0–100)

## 2017-02-09 LAB — POCT I-STAT 3, ART BLOOD GAS (G3+)
Acid-base deficit: 1 mmol/L (ref 0.0–2.0)
Acid-base deficit: 1 mmol/L (ref 0.0–2.0)
Acid-base deficit: 2 mmol/L (ref 0.0–2.0)
Bicarbonate: 23.9 mmol/L (ref 20.0–28.0)
Bicarbonate: 24.4 mmol/L (ref 20.0–28.0)
Bicarbonate: 23 mmol/L (ref 20.0–28.0)
O2 SAT: 98 %
O2 SAT: 98 %
O2 Saturation: 95 %
PCO2 ART: 41 mmHg (ref 32.0–48.0)
PCO2 ART: 41.3 mmHg (ref 32.0–48.0)
PCO2 ART: 39.1 mmHg (ref 32.0–48.0)
PH ART: 7.377 (ref 7.350–7.450)
PO2 ART: 115 mmHg — AB (ref 83.0–108.0)
PO2 ART: 103 mmHg (ref 83.0–108.0)
Patient temperature: 36.4
Patient temperature: 36.6
TCO2: 25 mmol/L (ref 0–100)
TCO2: 26 mmol/L (ref 0–100)
TCO2: 24 mmol/L (ref 0–100)
pH, Arterial: 7.371 (ref 7.350–7.450)
pH, Arterial: 7.377 (ref 7.350–7.450)
pO2, Arterial: 75 mmHg — ABNORMAL LOW (ref 83.0–108.0)

## 2017-02-09 LAB — COMPREHENSIVE METABOLIC PANEL
ALBUMIN: 2.1 g/dL — AB (ref 3.5–5.0)
ALT: 128 U/L — AB (ref 14–54)
ANION GAP: 8 (ref 5–15)
AST: 64 U/L — ABNORMAL HIGH (ref 15–41)
Alkaline Phosphatase: 68 U/L (ref 38–126)
BUN: 37 mg/dL — ABNORMAL HIGH (ref 6–20)
CALCIUM: 7.5 mg/dL — AB (ref 8.9–10.3)
CHLORIDE: 111 mmol/L (ref 101–111)
CO2: 25 mmol/L (ref 22–32)
Creatinine, Ser: 2.56 mg/dL — ABNORMAL HIGH (ref 0.44–1.00)
GFR calc Af Amer: 19 mL/min — ABNORMAL LOW (ref >60)
GFR calc non Af Amer: 17 mL/min — ABNORMAL LOW (ref >60)
GLUCOSE: 82 mg/dL (ref 65–99)
Potassium: 3.4 mmol/L — ABNORMAL LOW (ref 3.5–5.1)
SODIUM: 144 mmol/L (ref 135–145)
Total Bilirubin: 4 mg/dL — ABNORMAL HIGH (ref 0.3–1.2)
Total Protein: 4.3 g/dL — ABNORMAL LOW (ref 6.5–8.1)

## 2017-02-09 LAB — CBC
HEMATOCRIT: 28.5 % — AB (ref 36.0–46.0)
Hemoglobin: 9.1 g/dL — ABNORMAL LOW (ref 12.0–15.0)
MCH: 24.9 pg — AB (ref 26.0–34.0)
MCHC: 31.9 g/dL (ref 30.0–36.0)
MCV: 78.1 fL (ref 78.0–100.0)
PLATELETS: 70 10*3/uL — AB (ref 150–400)
RBC: 3.65 MIL/uL — AB (ref 3.87–5.11)
RDW: 20.6 % — ABNORMAL HIGH (ref 11.5–15.5)
WBC: 12.8 10*3/uL — ABNORMAL HIGH (ref 4.0–10.5)

## 2017-02-09 LAB — COOXEMETRY PANEL
Carboxyhemoglobin: 1.1 % (ref 0.5–1.5)
METHEMOGLOBIN: 1.3 % (ref 0.0–1.5)
O2 SAT: 72.8 %
Total hemoglobin: 9.2 g/dL — ABNORMAL LOW (ref 12.0–16.0)

## 2017-02-09 LAB — CULTURE, RESPIRATORY W GRAM STAIN: Special Requests: NORMAL

## 2017-02-09 LAB — CULTURE, RESPIRATORY

## 2017-02-09 LAB — PHOSPHORUS: Phosphorus: 3.7 mg/dL (ref 2.5–4.6)

## 2017-02-09 LAB — MAGNESIUM: Magnesium: 1.8 mg/dL (ref 1.7–2.4)

## 2017-02-09 MED ORDER — SODIUM CHLORIDE 0.9 % IV SOLN
30.0000 meq | Freq: Once | INTRAVENOUS | Status: AC
  Administered 2017-02-09: 30 meq via INTRAVENOUS
  Filled 2017-02-09: qty 15

## 2017-02-09 MED ORDER — METOPROLOL TARTRATE 5 MG/5ML IV SOLN
INTRAVENOUS | Status: AC
  Filled 2017-02-09: qty 5

## 2017-02-09 MED ORDER — ALBUMIN HUMAN 25 % IV SOLN
12.5000 g | Freq: Four times a day (QID) | INTRAVENOUS | Status: DC
  Administered 2017-02-09 – 2017-02-10 (×4): 12.5 g via INTRAVENOUS
  Filled 2017-02-09 (×4): qty 50

## 2017-02-09 MED ORDER — ATROPINE SULFATE 1 MG/10ML IJ SOSY
PREFILLED_SYRINGE | INTRAMUSCULAR | Status: AC
  Filled 2017-02-09: qty 10

## 2017-02-09 MED ORDER — VITAL HIGH PROTEIN PO LIQD: 1000.0000 mL | ORAL | Status: DC

## 2017-02-09 MED ORDER — MAGNESIUM SULFATE IN D5W 1-5 GM/100ML-% IV SOLN
1.0000 g | Freq: Once | INTRAVENOUS | Status: AC
  Administered 2017-02-09: 1 g via INTRAVENOUS
  Filled 2017-02-09: qty 100

## 2017-02-09 MED ORDER — METOPROLOL TARTRATE 5 MG/5ML IV SOLN
2.0000 mg | Freq: Four times a day (QID) | INTRAVENOUS | Status: DC | PRN
  Administered 2017-02-09 – 2017-02-10 (×5): 5 mg via INTRAVENOUS
  Filled 2017-02-09 (×4): qty 5

## 2017-02-09 MED ORDER — ALBUMIN HUMAN 5 % IV SOLN
12.5000 g | Freq: Once | INTRAVENOUS | Status: AC
  Administered 2017-02-09: 12.5 g via INTRAVENOUS

## 2017-02-09 MED ORDER — FUROSEMIDE 10 MG/ML IJ SOLN
80.0000 mg | Freq: Once | INTRAMUSCULAR | Status: AC
  Administered 2017-02-09: 80 mg via INTRAVENOUS
  Filled 2017-02-09: qty 8

## 2017-02-09 MED ORDER — ALBUMIN HUMAN 5 % IV SOLN
INTRAVENOUS | Status: AC
  Filled 2017-02-09: qty 250

## 2017-02-09 MED ORDER — VITAL AF 1.2 CAL PO LIQD: 1000.0000 mL | ORAL | Status: DC

## 2017-02-09 MED ORDER — SODIUM CHLORIDE 0.9 % IV SOLN
Freq: Once | INTRAVENOUS | Status: AC
  Administered 2017-02-09: 09:00:00 via INTRAVENOUS

## 2017-02-09 MED ORDER — DEXTROSE 50 % IV SOLN
INTRAVENOUS | Status: AC
  Administered 2017-02-09: 25 mL
  Filled 2017-02-09: qty 50

## 2017-02-09 NOTE — Care Management Note (Signed)
Case Management Note  Patient Details  Name: [name] [name] MRN: [id number] Date of Birth: 11-11-1935  Subjective/Objective:    Pt s/p CABG                Action/Plan:   PTA from home with husband.  Pt is ventilated and no family at bedside.  CM will continue to follow for discharge needs   Expected Discharge Date:                  Expected Discharge Plan:     In-House Referral:  Clinical Social Work  Discharge planning Services     Post Acute Care Choice:    Choice offered to:     DME Arranged:    DME Agency:     HH Arranged:    [place] Agency:     Status of Service:  In process, will continue to follow  If discussed at Long Length of Stay Meetings, dates discussed:    Additional Comments:  Maryclare Labrador, RN 02/09/2017, 3:10 PM

## 2017-02-09 NOTE — Progress Notes (Addendum)
Per Dr. Cyndia Bent recheck potassium once potassium run complete, give replacement if K < 4 as long as urine output keeps up. Limit pain medication overnight.  Rowe Pavy, RN

## 2017-02-09 NOTE — Progress Notes (Signed)
Patient ID: [name] [name], female   DOB: Sep 27, 1935, 82 y.o.   MRN: [id number]     Advanced Heart Failure Rounding Note  PCP:  Primary Cardiologist:   Subjective:    S/p emergent CABG x 4 and bioprosthetic MVR for LCX NSTEMI with flail MV  Remains intubated and sedated. On IABP with 1:3 support. Currently on milrinone 0.375 mcg/kg/min and norepi 6. Requires more sedation when sedated.  Good urine output. Renal function improving.  Co-ox 73%  Swan numbers:  CVP 9 PAP 30/15 CO 5.7/3.1    Objective:   Weight Range: 82.9 kg (182 lb 12.2 oz) Body mass index is 29.5 kg/m.   Vital Signs:   Temp:  [97.7 F (36.5 C)-99.5 F (37.5 C)] 97.9 F (36.6 C) (02/19 1000) Pulse Rate:  [94-180] 95 (02/19 1000) Resp:  [11-28] 15 (02/19 1000) BP: (77-132)/(41-80) 122/71 (02/19 1000) SpO2:  [97 %-100 %] 100 % (02/19 1000) FiO2 (%):  [40 %] 40 % (02/19 0735) Weight:  [82.9 kg (182 lb 12.2 oz)] 82.9 kg (182 lb 12.2 oz) (02/19 0500) Last BM Date:  (Prior to surgery)  Weight change: Filed Weights   02/07/17 0340 02/08/17 0500 02/09/17 0500  Weight: 85.1 kg (187 lb 9.8 oz) 81 kg (178 lb 9.2 oz) 82.9 kg (182 lb 12.2 oz)    Intake/Output:   Intake/Output Summary (Last 24 hours) at 02/09/17 1035 Last data filed at 02/09/17 1000  Gross per 24 hour  Intake           2263.2 ml  Output             3435 ml  Net          -1171.8 ml     Physical Exam: General:  Intubated and sedated.  HEENT: + ETT Neck: supple. RIJ swan Carotids 2+ bilat; no bruits. No lymphadenopathy or thyromegaly appreciated. Cor: PMI nondisplaced. RRR. No M/G/R noted.  Pedal pulses dopplerable.  Lungs: Scattered rhonchi bilaterally.  Abdomen: soft, ND, no HSM. No bruits or masses. +BS  Extremities: no cyanosis, clubbing, rash. Trace ankle edema. R groin IABP.  Neuro: Intubated and sedated.   Telemetry: Reviewed, NSR  Labs: CBC  Recent Labs  02/08/17 0334 02/09/17 0320  WBC 14.4* 12.8*  HGB 9.0* 9.1*    HCT 28.3* 28.5*  MCV 78.0 78.1  PLT 78* 70*   Basic Metabolic Panel  Recent Labs  02/08/17 0334 02/09/17 0320  NA 142 144  K 3.6 3.4*  CL 111 111  CO2 22 25  GLUCOSE 109* 82  BUN 32* 37*  CREATININE 2.78* 2.56*  CALCIUM 7.3* 7.5*  MG 1.9 1.8  PHOS  --  3.7   Liver Function Tests  Recent Labs  02/08/17 0334 02/09/17 0320  AST 112* 64*  ALT 176* 128*  ALKPHOS 66 68  BILITOT 3.4* 4.0*  PROT 4.5* 4.3*  ALBUMIN 2.2* 2.1*   No results for input(s): LIPASE, AMYLASE in the last 72 hours. Cardiac Enzymes No results for input(s): CKTOTAL, CKMB, CKMBINDEX, TROPONINI in the last 72 hours.  BNP: BNP (last 3 results) No results for input(s): BNP in the last 8760 hours.  ProBNP (last 3 results) No results for input(s): PROBNP in the last 8760 hours.   D-Dimer No results for input(s): DDIMER in the last 72 hours. Hemoglobin A1C No results for input(s): HGBA1C in the last 72 hours. Fasting Lipid Panel No results for input(s): CHOL, HDL, LDLCALC, TRIG, CHOLHDL, LDLDIRECT in the last 72 hours. Thyroid  Function Tests No results for input(s): TSH, T4TOTAL, T3FREE, THYROIDAB in the last 72 hours.  Invalid input(s): FREET3  Other results:     Imaging/Studies:  Dg Chest Port 1 View  Result Date: 02/09/2017 CLINICAL DATA:  Respiratory failure, intubated patient. Status post CABG and mitral valve repair 5 days ago. History of diabetes EXAM: PORTABLE CHEST 1 VIEW COMPARISON:  Portable chest x-ray of February 08, 2017 FINDINGS: The lungs are slightly better inflated today. There remains increased density at the left lung base. There is no large pleural effusion and no pneumothorax. The cardiac silhouette remains enlarged. The pulmonary vascularity is less engorged. There is tortuosity of the ascending and descending thoracic aorta. An intra aortic balloon pump is present with the tip projecting over the aortic arch slightly higher than before but this may be affected by  patient positioning. The Swan-Ganz catheter tip projects in the proximal left main pulmonary artery. The endotracheal tube tip lies approximately 2.8 cm above the carina. The esophagogastric tube and tip and proximal port lie below the GE junction. The right sided chest tube tip projects over the posterior 6 rib. A mediastinal drain projects at approximately the level of the carina. The 2 left lower chest tubes are in stable position. IMPRESSION: Improved aeration of both lungs with decreased interstitial edema. Persistent left lower lobe atelectasis or pneumonia. Small left pleural effusion likely is present. No pneumothorax. Cardiomegaly with decreased pulmonary vascular congestion. The support tubes and devices are in stable position. Please note that the tip of the intra-aortic balloon pump appears slightly higher than that seen on the previous study but is similar to that seen on the study of 07 February 2017. Electronically Signed   By: David  Martinique M.D.   On: 02/09/2017 07:23      Medications:     Scheduled Medications: . sodium chloride   Intravenous Once  . albumin human  12.5 g Intravenous Q6H  . aspirin EC  325 mg Oral Daily   Or  . aspirin  324 mg Per Tube Daily  . atorvastatin  80 mg Per Tube q1800  . cefTRIAXone (ROCEPHIN)  IV  1 g Intravenous Q24H  . chlorhexidine gluconate (MEDLINE KIT)  15 mL Mouth Rinse BID  . famotidine (PEPCID) IV  20 mg Intravenous Q24H  . insulin aspart  0-15 Units Subcutaneous Q4H  . mouth rinse  15 mL Mouth Rinse QID  . potassium chloride (KCL MULTIRUN) 30 mEq in 265 mL IVPB  30 mEq Intravenous Once  . sodium chloride flush  3 mL Intravenous Q12H    Infusions: . sodium chloride 10 mL/hr at 02/09/17 0700  . sodium chloride    . sodium chloride 10 mL/hr at 02/09/17 0600  . dexmedetomidine 0.4 mcg/kg/hr (02/09/17 1000)  . lactated ringers Stopped (02/06/17 0100)  . lactated ringers 10 mL/hr at 02/09/17 0700  . milrinone 0.375 mcg/kg/min (02/09/17  0700)  . nitroGLYCERIN Stopped (02/05/17 2300)  . norepinephrine (LEVOPHED) Adult infusion 4 mcg/min (02/09/17 1000)    PRN Medications: sodium chloride, fentaNYL (SUBLIMAZE) injection, midazolam, morphine injection, ondansetron (ZOFRAN) IV, sodium chloride flush   Assessment/Plan   1. CAD [name] 2. Severe MR with flail segment s/p MVR c/ bioprosthetic valve. 3. Cardiogenic shock: Echo pre-op with EF 40-45%, lateral akinesis, severe MR.  4. AKI 5. Shock Liver 6. Acute respiratory failure with hypoxemia requiring intubation 7. Thrombocytopenia: Post-op. 8. Tracheal aspirate with Strep pneumoniae  Remains intubated and sedated this am. Doing well hemodynamically.  Agree with pulling IABP today then attempt vent wean. Can remove swan once off vent.   Cardiac output looks good. Once sedation off will likely be able to get norepi off easily and then can start dropping milrinone.   Tracheal aspirate with Strep pneumo. Treating for tracheobronchitis with ceftriaxone.  WBC coming down.   Discussed with Dr. Cyndia Bent and family at bedside.   35 minutes critical care time.   Brittinie Wherley,MD 10:35 AM  02/09/2017

## 2017-02-09 NOTE — Progress Notes (Signed)
Patient ID: [name] [name], female   DOB: 1935/04/06, 82 y.o.   MRN: [id number]  IABP removed without difficulty. Pressure held for 20 minutes and no bleeding or hematoma. Strong dp and pt doppler signal.

## 2017-02-09 NOTE — Progress Notes (Signed)
Called and updated family on IABP DC and ventilator DC, son Louie Casa appreciated the phone call and will relay the information to the rest of the family.  Rowe Pavy, RN

## 2017-02-09 NOTE — Progress Notes (Signed)
Patient ID: [name] [name], female   DOB: 11-03-1935, 82 y.o.   MRN: [id number] EVENING ROUNDS NOTE :     [address]       [place] [address]             [phone]                 4 Days Post-Op Procedure(s) (LRB): CORONARY ARTERY BYPASS GRAFTING (CABG) x 4                                                                               LIMA-LAD SEQ SVG-DIAG-OM SVG-PD (N/A) TRANSESOPHAGEAL ECHOCARDIOGRAM (TEE) (N/A) MITRAL VALVE REPLACEMENT (MVR) WITH MAGNA MITRAL EASE PERICARDIAL BIOPROSTHESIS 25 MM (N/A)  Total Length of Stay:  LOS: 6 days  BP 119/70   Pulse (!) 104   Temp 98.6 F (37 C)   Resp 18   Ht 5\' 6"  (1.676 m)   Wt 182 lb 12.2 oz (82.9 kg)   SpO2 100%   BMI 29.50 kg/m   .Intake/Output      02/19 0701 - 02/20 0700   I.V. (mL/kg) 546.7 (6.6)   Blood 316.4   NG/GT    IV Piggyback 730   Total Intake(mL/kg) 1593.1 (19.2)   Urine (mL/kg/hr) 1550 (1.4)   Emesis/NG output 0 (0)   Chest Tube 100 (0.1)   Total Output 1650   Net -56.9         . sodium chloride Stopped (02/09/17 1800)  . sodium chloride    . sodium chloride Stopped (02/09/17 1800)  . lactated ringers Stopped (02/06/17 0100)  . lactated ringers 10 mL/hr at 02/09/17 1800  . milrinone 0.375 mcg/kg/min (02/09/17 1800)  . nitroGLYCERIN 40 mcg/min (02/09/17 1900)  . norepinephrine (LEVOPHED) Adult infusion Stopped (02/09/17 1415)     Lab Results  Component Value Date   WBC 12.8 (H) 02/09/2017   HGB 8.2 (L) 02/09/2017   HCT 24.0 (L) 02/09/2017   PLT 70 (L) 02/09/2017   GLUCOSE 103 (H) 02/09/2017   CHOL 156 02/04/2017   TRIG 104 02/04/2017   HDL 42 02/04/2017   LDLCALC 93 02/04/2017   ALT 128 (H) 02/09/2017   AST 64 (H) 02/09/2017   NA 144 02/09/2017   K 3.5 02/09/2017   CL 109 02/09/2017   CREATININE 2.40 (H) 02/09/2017   BUN 36 (H) 02/09/2017   CO2 25 02/09/2017   TSH 0.576 02/03/2017   INR 1.87 02/05/2017   HGBA1C 6.3 (H) 02/03/2017   Awake and neuro intact, IAB   Out and extubated   Grace Isaac MD  Beeper [phone] Office [phone] 02/09/2017 8:44 PM

## 2017-02-09 NOTE — Progress Notes (Signed)
4 Days Post-Op Procedure(s) (LRB): CORONARY ARTERY BYPASS GRAFTING (CABG) x 4                                                                               LIMA-LAD SEQ SVG-DIAG-OM SVG-PD (N/A) TRANSESOPHAGEAL ECHOCARDIOGRAM (TEE) (N/A) MITRAL VALVE REPLACEMENT (MVR) WITH MAGNA MITRAL EASE PERICARDIAL BIOPROSTHESIS 25 MM (N/A) Subjective:  Intubated and sedated on Precedex  Remains on Milrinone 0.375, levophed 11, IABP 1:2 with CI 2.1 and Co-ox of 73%   Objective: Vital signs in last 24 hours: Temp:  [97.7 F (36.5 C)-99.5 F (37.5 C)] 97.9 F (36.6 C) (02/19 0800) Pulse Rate:  [95-180] 161 (02/19 0800) Cardiac Rhythm: Normal sinus rhythm (02/19 0600) Resp:  [11-28] 12 (02/19 0800) BP: (77-132)/(41-80) 120/56 (02/19 0800) SpO2:  [97 %-100 %] 100 % (02/19 0800) FiO2 (%):  [40 %] 40 % (02/19 0735) Weight:  [82.9 kg (182 lb 12.2 oz)] 82.9 kg (182 lb 12.2 oz) (02/19 0500)  Hemodynamic parameters for last 24 hours: PAP: (22-44)/(11-31) 23/11 CO:  [3.7 L/min-4.6 L/min] 3.9 L/min CI:  [2 L/min/m2-2.4 L/min/m2] 2.1 L/min/m2  Intake/Output from previous day: 02/18 0701 - 02/19 0700 In: 1923.2 [I.V.:1318.2; NG/GT:240; IV Piggyback:365] Out: 3250 [Urine:2670; Emesis/NG output:150; Chest Tube:430] Intake/Output this shift: Total I/O In: 123.6 [I.V.:73.6; IV Piggyback:50] Out: 150 [Urine:150]  Neurologic: awakens and follows commands Heart: regular rate and rhythm, S1, S2 normal, no murmur, click, rub or gallop Lungs: clear to auscultation bilaterally Abdomen: soft, nontender, few BS Extremities:  edema mild Wound: incision ok  Lab Results:  Recent Labs  02/08/17 0334 02/09/17 0320  WBC 14.4* 12.8*  HGB 9.0* 9.1*  HCT 28.3* 28.5*  PLT 78* 70*   BMET:  Recent Labs  02/08/17 0334 02/09/17 0320  NA 142 144  K 3.6 3.4*  CL 111 111  CO2 22 25  GLUCOSE 109* 82  BUN 32* 37*  CREATININE 2.78* 2.56*  CALCIUM 7.3* 7.5*    PT/INR: No results for input(s): LABPROT, INR  in the last 72 hours. ABG    Component Value Date/Time   PHART 7.312 (L) 02/07/2017 0330   HCO3 19.5 (L) 02/07/2017 0330   TCO2 22 02/06/2017 1658   ACIDBASEDEF 5.6 (H) 02/07/2017 0330   O2SAT 72.8 02/09/2017 0330   CBG (last 3)   Recent Labs  02/08/17 2313 02/09/17 0322 02/09/17 0810  GLUCAP 91 82 62*   CLINICAL DATA:  Respiratory failure, intubated patient. Status post CABG and mitral valve repair 5 days ago. History of diabetes  EXAM: PORTABLE CHEST 1 VIEW  COMPARISON:  Portable chest x-ray of February 08, 2017  FINDINGS: The lungs are slightly better inflated today. There remains increased density at the left lung base. There is no large pleural effusion and no pneumothorax. The cardiac silhouette remains enlarged. The pulmonary vascularity is less engorged. There is tortuosity of the ascending and descending thoracic aorta. An intra aortic balloon pump is present with the tip projecting over the aortic arch slightly higher than before but this may be affected by patient positioning. The Swan-Ganz catheter tip projects in the proximal left main pulmonary artery. The endotracheal tube tip lies approximately 2.8 cm above the carina. The esophagogastric  tube and tip and proximal port lie below the GE junction. The right sided chest tube tip projects over the posterior 6 rib. A mediastinal drain projects at approximately the level of the carina. The 2 left lower chest tubes are in stable position.  IMPRESSION: Improved aeration of both lungs with decreased interstitial edema. Persistent left lower lobe atelectasis or pneumonia. Small left pleural effusion likely is present. No pneumothorax.  Cardiomegaly with decreased pulmonary vascular congestion.  The support tubes and devices are in stable position. Please note that the tip of the intra-aortic balloon pump appears slightly higher than that seen on the previous study but is similar to that seen on the  study of 07 February 2017.   Electronically Signed   By: David  Martinique M.D.   On: 02/09/2017 07:23   Assessment/Plan: S/P Procedure(s) (LRB): CORONARY ARTERY BYPASS GRAFTING (CABG) x 4                                                                               LIMA-LAD SEQ SVG-DIAG-OM SVG-PD (N/A) TRANSESOPHAGEAL ECHOCARDIOGRAM (TEE) (N/A) MITRAL VALVE REPLACEMENT (MVR) WITH MAGNA MITRAL EASE PERICARDIAL BIOPROSTHESIS 25 MM (N/A)  Severe 3-vessel CAD presenting with acute NSTEMI. Initial echo showing severe central MR due to annular dilation. Sudden decompensation with acute systolic and diastolic heart failure, cardiogenic shock due to ruptured papillary muscle and wide open MR, acute respiratory failure, acute anuric renal failure and shock liver.  Hemodynamics are relatively stable on milrinone and levophed which is at 11 mcg.  Good CI and Co-ox 73. Wean levophed as tolerated. I decreased IABP to 1:3 and plan to remove this am after platelets. I think her levophed requirement is directly related to sedation level.  Acute renal failure: creat continues to improve and making adequate urine with lasix. Weight is up and down.  Shock liver: LFT's improving and bili rising slightly.   Acute respiratory failure: consider weaning vent after IABP out.   Thrombocytopenia: most likely secondary to IABP  Expected acute postop blood loss anemia: stable.  Hypomagnesemia: 1.9 this am and should be ok  Sputum culture with strep pneumo: started on Ceftriaxone for possible bronchitis. I don't see any signs of pneumonia.  Preop urinalysis showed moderate leukocytes and + nitrite. Culture negative.  DM: hypoglycemia this am. Will stop Levemir and continue Q4 CBG's and SSI.  Nutrition: if we can't wean to extubate today she will need tube feeds started.   LOS: 6 days    Gaye Pollack 02/09/2017

## 2017-02-09 NOTE — Procedures (Signed)
Extubation Procedure Note  Patient Details:   Name: [name] [name] DOB: 09/16/1935 MRN: [id number]   Airway Documentation:     Evaluation  O2 sats: stable throughout Complications: No apparent complications Patient did tolerate procedure well. Bilateral Breath Sounds: Clear, Diminished   Yes  Patient tolerated wean. Positive for cuff leak. MD ordered to extubate and is at bedside. Patient extubated to a 3 Lpm nasal cannula. No signs of dyspnea or stridor noted. Patient unable to obtain a good seal on the [employer]. Patient instructed on the flutter with good effort. RN at bedside also.  Myrtie Neither 02/09/2017, 3:26 PM

## 2017-02-09 NOTE — Progress Notes (Signed)
PULMONARY / CRITICAL CARE MEDICINE   Name: [name] [name] MRN: [id number] DOB: 1935-02-19    ADMISSION DATE:  02/03/2017 CONSULTATION DATE:  02/04/2017  REFERRING MD:  Dr. Martinique  CHIEF COMPLAINT:  AMS brief:  82 year old female with past medical history as below, which is significant for CAD, diabetes mellitus, and hypertension. She presented to cardiologist to/13 with complaints of chest pain and shortness of breath while doing light housework onset about 3 days prior to arrival. She had an EKG done in the clinic, which was concerning for ischemia. She was referred for urgent cardiac cath which revealed three-vessel CAD. She was admitted in transfer to [facility] for urgent CABG which was scheduled for 2/15. After arrival to [facility] on 2/14 she was noted to be lethargic and hypotensive after doing well all day. She was in cardiogenic shock secondary to coronary artery disease requiring intubation, vasopressors, and emergent blood pressure support with intra-aortic balloon pump. The procedure was without complication, and she was sent to the ICU for recovery. PCCM to assist with ventilator and medical management. Patient then had CABG x 4 and MV replacement on 2/15.    2/18 Resting on vent , sedation with precedex   Remains on Levophed, off Vasopressin  On IABP 1:1    SUBJECTIVE/OVERNIGHT/INTERVAL HX 2.19  - off IABP. On milrinone, levophed 1mcg, Off precedex and IABP. Not on sedatin gtt. Per RN CVTS aiing for rapid wean to extubate. 2 CTs removed /s p albumin and platelet transfusion 02/09/2017   VITAL SIGNS: BP 122/71   Pulse 95   Temp 97.9 F (36.6 C)   Resp 15   Ht 5\' 6"  (1.676 m)   Wt 82.9 kg (182 lb 12.2 oz)   SpO2 100%   BMI 29.50 kg/m   HEMODYNAMICS: PAP: (22-44)/(11-31) 40/21 CO:  [3.7 L/min-5.7 L/min] 5.7 L/min CI:  [2 L/min/m2-3.1 L/min/m2] 3.1 L/min/m2  VENTILATOR SETTINGS: Vent Mode: SIMV;PRVC;PSV FiO2 (%):  [40 %] 40 % Set Rate:  [12 bmp] 12  bmp Vt Set:  [500 mL] 500 mL PEEP:  [5 cmH20] 5 cmH20 Pressure Support:  [10 cmH20] 10 cmH20 Plateau Pressure:  [19 cmH20-21 cmH20] 21 cmH20  INTAKE / OUTPUT: I/O last 3 completed shifts: In: 2731.7 [I.V.:2016.7; NG/GT:300; IV Piggyback:415] Out: 7824 [Urine:3565; Emesis/NG output:550; Chest Tube:510]  PHYSICAL EXAMINATION: Gen: Critically ill looking Neuro RASS -3 Chest : scar + Abd: soft Ext: no edema Abg soft Lung: sync with vent   LABS:  PULMONARY  Recent Labs Lab 02/05/17 1444  02/05/17 1751 02/05/17 1856 02/05/17 2258 02/06/17 0425 02/06/17 0948 02/06/17 1658 02/07/17 0330 02/08/17 0330 02/09/17 0330  PHART 7.269*  --  7.389 7.504*  --   --  7.353  --  7.312*  --   --   PCO2ART 50.4*  --  33.7 29.6*  --   --  36.2  --  39.8  --   --   PO2ART 323.0*  --  142.0* 237.0*  --   --  134.0*  --  102  --   --   HCO3 23.1  --  20.4 23.4  --   --  20.1  --  19.5*  --   --   TCO2 25  < > 21 24 22   --  21 22  --   --   --   O2SAT 100.0  --  99.0 100.0  --  69.0 99.0  --  97.0  81.5 68.5 72.8  < > =  values in this interval not displayed.  CBC  Recent Labs Lab 02/07/17 0456 02/08/17 0334 02/09/17 0320  HGB 8.7* 9.0* 9.1*  HCT 27.2* 28.3* 28.5*  WBC 13.8* 14.4* 12.8*  PLT 76* 78* 70*    COAGULATION  Recent Labs Lab 02/03/17 1118 02/05/17 1844  INR 1.11 1.87    CARDIAC   Recent Labs Lab 02/03/17 1118  TROPONINI 6.48*   No results for input(s): PROBNP in the last 168 hours.   CHEMISTRY  Recent Labs Lab 02/05/17 0930  02/06/17 0417 02/06/17 1636 02/06/17 1658 02/07/17 0454 02/07/17 0456 02/07/17 0843 02/08/17 0334 02/09/17 0320  NA  --   < > 144  --  145 141 140  --  142 144  K  --   < > 4.0  --  3.7 3.7 3.6  --  3.6 3.4*  CL  --   < > 116*  --  110 110 111  --  111 111  CO2  --   --  20*  --   --  20* 20*  --  22 25  GLUCOSE  --   < > 106*  --  128* 164* 169*  --  109* 82  BUN  --   < > 26*  --  27* 28* 29*  --  32* 37*  CREATININE   --   < > 2.63* 2.94* 3.00* 2.98* 2.85*  --  2.78* 2.56*  CALCIUM  --   --  7.5*  --   --  7.3* 7.2*  --  7.3* 7.5*  MG 1.8  < > 1.8 1.8  --   --   --  1.6* 1.9 1.8  PHOS 2.6  --   --   --   --   --   --   --   --  3.7  < > = values in this interval not displayed. Estimated Creatinine Clearance: 19.3 mL/min (by C-G formula based on SCr of 2.56 mg/dL (H)).   LIVER  Recent Labs Lab 02/03/17 1118  02/05/17 0515 02/05/17 1844 02/07/17 0454 02/07/17 0456 02/08/17 0334 02/09/17 0320  AST  --   < > 527*  --  243* 211* 112* 64*  ALT  --   < > 313*  --  240* 220* 176* 128*  ALKPHOS  --   < > 91  --  61 57 66 68  BILITOT  --   < > 1.5*  --  3.4* 3.0* 3.4* 4.0*  PROT  --   < > 4.7*  --  4.1* 3.8* 4.5* 4.3*  ALBUMIN  --   < > 2.7*  --  2.4* 2.3* 2.2* 2.1*  INR 1.11  --   --  1.87  --   --   --   --   < > = values in this interval not displayed.   INFECTIOUS  Recent Labs Lab 02/05/17 0835  LATICACIDVEN 1.6     ENDOCRINE CBG (last 3)   Recent Labs  02/08/17 2313 02/09/17 0322 02/09/17 0810  GLUCAP 91 82 62*         IMAGING x48h  - image(s) personally visualized  -   highlighted in bold Dg Chest Port 1 View  Result Date: 02/09/2017 CLINICAL DATA:  Respiratory failure, intubated patient. Status post CABG and mitral valve repair 5 days ago. History of diabetes EXAM: PORTABLE CHEST 1 VIEW COMPARISON:  Portable chest x-ray of February 08, 2017 FINDINGS: The lungs are slightly better inflated  today. There remains increased density at the left lung base. There is no large pleural effusion and no pneumothorax. The cardiac silhouette remains enlarged. The pulmonary vascularity is less engorged. There is tortuosity of the ascending and descending thoracic aorta. An intra aortic balloon pump is present with the tip projecting over the aortic arch slightly higher than before but this may be affected by patient positioning. The Swan-Ganz catheter tip projects in the proximal left main  pulmonary artery. The endotracheal tube tip lies approximately 2.8 cm above the carina. The esophagogastric tube and tip and proximal port lie below the GE junction. The right sided chest tube tip projects over the posterior 6 rib. A mediastinal drain projects at approximately the level of the carina. The 2 left lower chest tubes are in stable position. IMPRESSION: Improved aeration of both lungs with decreased interstitial edema. Persistent left lower lobe atelectasis or pneumonia. Small left pleural effusion likely is present. No pneumothorax. Cardiomegaly with decreased pulmonary vascular congestion. The support tubes and devices are in stable position. Please note that the tip of the intra-aortic balloon pump appears slightly higher than that seen on the previous study but is similar to that seen on the study of 07 February 2017. Electronically Signed   By: David  Martinique M.D.   On: 02/09/2017 07:23   Dg Chest Port 1 View  Result Date: 02/08/2017 CLINICAL DATA:  Coronary disease post CABG, history hypertension, diabetes mellitus EXAM: PORTABLE CHEST 1 VIEW COMPARISON:  Portable exam 0544 hours compared to 02/07/2017 FINDINGS: Tip of endotracheal tube projects 10 mm above carina. Nasogastric tube extends into stomach. Epicardial pacing wires present. BILATERAL thoracostomy tubes and mediastinal drains present. RIGHT jugular Swan-Ganz catheter with tip projecting over main pulmonary artery at bifurcation. Tip of intra-aortic balloon pump projects at the inferior aspect of aortic arch slightly lower than on the previous study. Enlargement of cardiac silhouette post CABG and MVR. Pulmonary vascular congestion. Bibasilar atelectasis greater on LEFT. No pneumothorax.  All IMPRESSION: Stable postoperative changes as above. Electronically Signed   By: Lavonia Dana M.D.   On: 02/08/2017 07:20       STUDIES:  LHC 2/13 > significant three-vessel coronary artery disease, including sequential 70% 80% proximal and  mid LAD stenoses, mid left circumflex lesion, and chronic subtotal occlusion of the mid RCA. Echo 2/13 > LVEF 40-45%, hypokinesis of the anterior myocardium, akinesis of the anterolateral and inferior lateral myocardium. Grade 2 diastolic dysfunction.  CULTURES: MRSA 2/14 > (-) Trache asp 2/14 >abund strep pneum>   ANTIBIOTICS: Cefuroxime 2/14 > 2/15 Vanc 2/14 > 2/15 2/17 Rocephin >>  SIGNIFICANT EVENTS: 2/14 admit for urgent CABG but pt in cardiogenic shock. Intubated.  2/15 CABG x 4, MV replacement   LINES/TUBES: L fem art sheath 2/14 > L IJ Swan 2/14 > L radial art line 2/15 >    DISCUSSION: 56F, with severe 3VD, in cardiogenic shock.  Initial plan was for CABG but it is on hold as she is too sick for CABG. Intubated. With IABP. On pressors. Patient subsequently had CABG x 4 and MV replacement on 2/15.   ASSESSMENT / PLAN:  PULMONARY A: Acute resp failure due to cadiac issues P:   SBT as tolerated Extubate when patient meets criteria per CVTS protocol  CARDIOVASCULAR A:  Cardiogenic shock P:  Per cards and cvts  RENAL A:   AKI hypomag hpokal P:   Replete k and mag monitor  GASTROINTESTINAL A:   NPO P:   TF if  not extubation 02/09/2017   HEMATOLOGIC A:   Anemia of critical illness P:  - PRBC for hgb </= 8gm%    - exceptions are   - active bleeding with hemodynamic instability, then transfuse regardless of hemoglobin value   At at all times try to transfuse 1 unit prbc as possible with exception of active hemorrhage    INFECTIOUS A:   Strep pneumonia sputum ? bronchitis P:   Anti-infectives    Start     Dose/Rate Route Frequency Ordered Stop   02/07/17 1000  cefUROXime (ZINACEF) 1.5 g in dextrose 5 % 50 mL IVPB     1.5 g 100 mL/hr over 30 Minutes Intravenous Every 24 hours 02/06/17 1810 02/07/17 0937   02/07/17 0815  cefTRIAXone (ROCEPHIN) 1 g in dextrose 5 % 50 mL IVPB     1 g 100 mL/hr over 30 Minutes Intravenous Every 24 hours  02/07/17 0811     02/06/17 1100  vancomycin (VANCOCIN) IVPB 1000 mg/200 mL premix  Status:  Discontinued     1,000 mg 200 mL/hr over 60 Minutes Intravenous  Once 02/05/17 1847 02/06/17 0759   02/06/17 0400  vancomycin (VANCOCIN) 1,250 mg in sodium chloride 0.9 % 250 mL IVPB  Status:  Discontinued     1,250 mg 166.7 mL/hr over 90 Minutes Intravenous To Surgery 02/05/17 1012 02/05/17 1027   02/06/17 0400  cefUROXime (ZINACEF) 1.5 g in dextrose 5 % 50 mL IVPB  Status:  Discontinued     1.5 g 100 mL/hr over 30 Minutes Intravenous To Surgery 02/05/17 1012 02/05/17 1027   02/06/17 0400  cefUROXime (ZINACEF) 750 mg in dextrose 5 % 50 mL IVPB  Status:  Discontinued     750 mg 100 mL/hr over 30 Minutes Intravenous To Surgery 02/05/17 0952 02/05/17 0955   02/05/17 2300  cefUROXime (ZINACEF) 1.5 g in dextrose 5 % 50 mL IVPB  Status:  Discontinued     1.5 g 100 mL/hr over 30 Minutes Intravenous Every 12 hours 02/05/17 1847 02/06/17 1810   02/05/17 1000  vancomycin (VANCOCIN) 1,250 mg in sodium chloride 0.9 % 250 mL IVPB     1,250 mg 166.7 mL/hr over 90 Minutes Intravenous To Surgery 02/05/17 0947 02/05/17 1115   02/05/17 1000  cefUROXime (ZINACEF) 1.5 g in dextrose 5 % 50 mL IVPB     1.5 g 100 mL/hr over 30 Minutes Intravenous To Surgery 02/05/17 0947 02/05/17 1712   02/05/17 1000  cefUROXime (ZINACEF) 750 mg in dextrose 5 % 50 mL IVPB  Status:  Discontinued     750 mg 100 mL/hr over 30 Minutes Intravenous To Surgery 02/05/17 0947 02/05/17 1840   02/05/17 0400  vancomycin (VANCOCIN) 1,250 mg in sodium chloride 0.9 % 250 mL IVPB  Status:  Discontinued     1,250 mg 166.7 mL/hr over 90 Minutes Intravenous To Surgery 02/04/17 1443 02/05/17 0904   02/05/17 0400  cefUROXime (ZINACEF) 1.5 g in dextrose 5 % 50 mL IVPB  Status:  Discontinued     1.5 g 100 mL/hr over 30 Minutes Intravenous To Surgery 02/04/17 1443 02/05/17 0904   02/05/17 0400  cefUROXime (ZINACEF) 750 mg in dextrose 5 % 50 mL IVPB  Status:   Discontinued     750 mg 100 mL/hr over 30 Minutes Intravenous To Surgery 02/04/17 1443 02/05/17 0904       ENDOCRINE A:   DM P:   ssi  NEUROLOGIC A:   Acute enceph - rass --3 off precedex but RN  reports ability to follow comands P:   RASS goal: 0 mnitor   FAMILY  - Updates:  None at bedside 02/09/2017    The patient is critically ill with multiple organ systems failure and requires high complexity decision making for assessment and support, frequent evaluation and titration of therapies, application of advanced monitoring technologies and extensive interpretation of multiple databases.   Critical Care Time devoted to patient care services described in this note is  30  Minutes. This time reflects time of care of this signee Dr Brand Males. This critical care time does not reflect procedure time, or teaching time or supervisory time of PA/NP/Med student/Med Resident etc but could involve care discussion time    Dr. Brand Males, M.D., [facility].C.P Pulmonary and Critical Care Medicine Staff Physician [facility] Pager: [phone], If no answer or between  15:00h - 7:00h: call [phone]  02/09/2017 11:03 AM

## 2017-02-09 NOTE — Progress Notes (Signed)
Nutrition Consult/Follow Up  DOCUMENTATION CODES:   Not applicable  INTERVENTION:    Start Vital AF 1.2 formula at 20 ml/hr       Rec goal rate of 50 ml/h (1200 ml per day) and Prostat 30 ml BID   NUTRITION DIAGNOSIS:   Inadequate oral intake related to inability to eat as evidenced by NPO status, ongoing  GOAL:   Patient will meet greater than or equal to 90% of their needs, progressing   MONITOR:   TF tolerance, Vent status, Labs, Weight trends, I & O's  ASSESSMENT:   82 yo Feamle with HTN and DM found to have significant three-vessel CAD in the setting of new chest pain with minimal exertion over the last 3 days consistent with unstable angina.   Pt s/p procedures 2/15: CABG, MITRAL VALVE REPAIR  Patient is currently intubated on ventilator support >> OGT in place MV: 6.8 L/min Temp (24hrs), Avg:98.6 F (37 C), Min:97.5 F (36.4 C), Max:99.5 F (37.5 C)  Pt admitted for urgent CABG, now in cardiogenic shock. Resting on vent, sedation with Precedex. Consulted to start trickle tube feeding. Labs and medications reviewed. CBG's [id number].  Nutrition focused physical exam completed.  No muscle or subcutaneous fat depletion noticed.  Diet Order:  Diet NPO time specified  Skin:  Reviewed, no issues  Last BM:  PTA  Height:   Ht Readings from Last 1 Encounters:  02/05/17 5\' 6"  (1.676 m)    Weight:   Wt Readings from Last 1 Encounters:  02/09/17 182 lb 12.2 oz (82.9 kg)    Ideal Body Weight:  59 kg  BMI:  Body mass index is 29.5 kg/m.  Estimated Nutritional Needs:   Kcal:  1531  Protein:  115-125 gm  Fluid:  per MD  EDUCATION NEEDS:   No education needs identified at this time  Arthur Holms, RD, LDN Pager #: [phone] After-Hours Pager #: [phone]

## 2017-02-09 NOTE — Progress Notes (Signed)
Patient unable to pass parameters at this time. NIF -11 and VC 0.3. Will give patient more time to wake up. RN aware.

## 2017-02-09 NOTE — Progress Notes (Signed)
Patient ID: [name] [name], female   DOB: 11/26/1935, 82 y.o.   MRN: [id number] CT surgery  She has been hemodynamically stable today and is off levophed now that Precedex is off.   She has been on 40% CPAP today and doing fine. ABG is good and RR 18. She is still a little sleepy but follows commands and can lift her head easily. She has not done mechanics successfully but I think she doesn't understand what to do.  Will extubate.

## 2017-02-10 ENCOUNTER — Inpatient Hospital Stay (HOSPITAL_COMMUNITY): Payer: Medicare Other

## 2017-02-10 ENCOUNTER — Encounter (HOSPITAL_COMMUNITY): Payer: Self-pay | Admitting: Surgery

## 2017-02-10 DIAGNOSIS — I340 Nonrheumatic mitral (valve) insufficiency: Secondary | ICD-10-CM

## 2017-02-10 LAB — GLUCOSE, CAPILLARY
GLUCOSE-CAPILLARY: 201 mg/dL — AB (ref 65–99)
GLUCOSE-CAPILLARY: 204 mg/dL — AB (ref 65–99)
GLUCOSE-CAPILLARY: 210 mg/dL — AB (ref 65–99)
Glucose-Capillary: 144 mg/dL — ABNORMAL HIGH (ref 65–99)
Glucose-Capillary: 155 mg/dL — ABNORMAL HIGH (ref 65–99)
Glucose-Capillary: 180 mg/dL — ABNORMAL HIGH (ref 65–99)
Glucose-Capillary: 228 mg/dL — ABNORMAL HIGH (ref 65–99)

## 2017-02-10 LAB — COMPREHENSIVE METABOLIC PANEL
ALT: 90 U/L — ABNORMAL HIGH (ref 14–54)
AST: 50 U/L — AB (ref 15–41)
Albumin: 2.9 g/dL — ABNORMAL LOW (ref 3.5–5.0)
Alkaline Phosphatase: 74 U/L (ref 38–126)
Anion gap: 13 (ref 5–15)
BILIRUBIN TOTAL: 5.5 mg/dL — AB (ref 0.3–1.2)
BUN: 42 mg/dL — AB (ref 6–20)
CO2: 23 mmol/L (ref 22–32)
Calcium: 8.3 mg/dL — ABNORMAL LOW (ref 8.9–10.3)
Chloride: 107 mmol/L (ref 101–111)
Creatinine, Ser: 2.33 mg/dL — ABNORMAL HIGH (ref 0.44–1.00)
GFR, EST AFRICAN AMERICAN: 22 mL/min — AB (ref >60)
GFR, EST NON AFRICAN AMERICAN: 19 mL/min — AB (ref >60)
Glucose, Bld: 193 mg/dL — ABNORMAL HIGH (ref 65–99)
POTASSIUM: 3.9 mmol/L (ref 3.5–5.1)
Sodium: 143 mmol/L (ref 135–145)
TOTAL PROTEIN: 5.1 g/dL — AB (ref 6.5–8.1)

## 2017-02-10 LAB — PHOSPHORUS: PHOSPHORUS: 3.3 mg/dL (ref 2.5–4.6)

## 2017-02-10 LAB — CBC
HEMATOCRIT: 25.3 % — AB (ref 36.0–46.0)
Hemoglobin: 8.5 g/dL — ABNORMAL LOW (ref 12.0–15.0)
MCH: 26.1 pg (ref 26.0–34.0)
MCHC: 33.6 g/dL (ref 30.0–36.0)
MCV: 77.6 fL — ABNORMAL LOW (ref 78.0–100.0)
PLATELETS: 108 10*3/uL — AB (ref 150–400)
RBC: 3.26 MIL/uL — ABNORMAL LOW (ref 3.87–5.11)
RDW: 21.5 % — AB (ref 11.5–15.5)
WBC: 11.5 10*3/uL — AB (ref 4.0–10.5)

## 2017-02-10 LAB — COOXEMETRY PANEL
CARBOXYHEMOGLOBIN: 1.2 % (ref 0.5–1.5)
Methemoglobin: 1.2 % (ref 0.0–1.5)
O2 Saturation: 73.8 %
TOTAL HEMOGLOBIN: 8.1 g/dL — AB (ref 12.0–16.0)

## 2017-02-10 LAB — PREPARE PLATELET PHERESIS: UNIT DIVISION: 0

## 2017-02-10 LAB — POCT I-STAT 4, (NA,K, GLUC, HGB,HCT)
Glucose, Bld: 255 mg/dL — ABNORMAL HIGH (ref 65–99)
HEMATOCRIT: 26 % — AB (ref 36.0–46.0)
HEMOGLOBIN: 8.8 g/dL — AB (ref 12.0–15.0)
POTASSIUM: 3.4 mmol/L — AB (ref 3.5–5.1)
SODIUM: 142 mmol/L (ref 135–145)

## 2017-02-10 LAB — MAGNESIUM: Magnesium: 1.9 mg/dL (ref 1.7–2.4)

## 2017-02-10 MED ORDER — CLONIDINE HCL 0.2 MG/24HR TD PTWK
0.2000 mg | MEDICATED_PATCH | TRANSDERMAL | Status: DC
  Administered 2017-02-10: 0.2 mg via TRANSDERMAL
  Filled 2017-02-10 (×2): qty 1

## 2017-02-10 MED ORDER — AMIODARONE HCL IN DEXTROSE 360-4.14 MG/200ML-% IV SOLN
INTRAVENOUS | Status: AC
  Administered 2017-02-10: 150 mg via INTRAVENOUS
  Filled 2017-02-10: qty 200

## 2017-02-10 MED ORDER — CHLORHEXIDINE GLUCONATE 0.12 % MT SOLN
15.0000 mL | Freq: Two times a day (BID) | OROMUCOSAL | Status: DC
  Administered 2017-02-10 – 2017-02-17 (×13): 15 mL via OROMUCOSAL
  Filled 2017-02-10 (×10): qty 15

## 2017-02-10 MED ORDER — ORAL CARE MOUTH RINSE
15.0000 mL | Freq: Two times a day (BID) | OROMUCOSAL | Status: DC
  Administered 2017-02-11 – 2017-03-01 (×26): 15 mL via OROMUCOSAL

## 2017-02-10 MED ORDER — AMIODARONE HCL IN DEXTROSE 360-4.14 MG/200ML-% IV SOLN: 30.0000 mg/h | INTRAVENOUS | Status: DC

## 2017-02-10 MED ORDER — ATORVASTATIN CALCIUM 80 MG PO TABS
80.0000 mg | ORAL_TABLET | Freq: Every day | ORAL | Status: DC
  Administered 2017-02-10 – 2017-02-21 (×11): 80 mg via ORAL
  Filled 2017-02-10 (×10): qty 1

## 2017-02-10 MED ORDER — HYDRALAZINE HCL 20 MG/ML IJ SOLN
10.0000 mg | INTRAMUSCULAR | Status: DC | PRN
  Administered 2017-02-10 (×2): 10 mg via INTRAVENOUS
  Filled 2017-02-10 (×2): qty 1

## 2017-02-10 MED ORDER — HYDRALAZINE HCL 50 MG PO TABS
50.0000 mg | ORAL_TABLET | Freq: Three times a day (TID) | ORAL | Status: DC
Start: 2017-02-10 — End: 2017-02-11
  Administered 2017-02-10 (×2): 50 mg via ORAL
  Filled 2017-02-10 (×2): qty 1

## 2017-02-10 MED ORDER — POTASSIUM CHLORIDE CRYS ER 10 MEQ PO TBCR
20.0000 meq | EXTENDED_RELEASE_TABLET | Freq: Two times a day (BID) | ORAL | Status: DC
  Administered 2017-02-11: 20 meq via ORAL
  Filled 2017-02-10 (×2): qty 2

## 2017-02-10 MED ORDER — AMIODARONE HCL IN DEXTROSE 360-4.14 MG/200ML-% IV SOLN
60.0000 mg/h | INTRAVENOUS | Status: DC
  Administered 2017-02-10 (×2): 60 mg/h via INTRAVENOUS
  Filled 2017-02-10: qty 200

## 2017-02-10 MED ORDER — FUROSEMIDE 10 MG/ML IJ SOLN
80.0000 mg | Freq: Two times a day (BID) | INTRAMUSCULAR | Status: DC
  Administered 2017-02-10 – 2017-02-11 (×3): 80 mg via INTRAVENOUS
  Filled 2017-02-10 (×4): qty 8

## 2017-02-10 MED ORDER — POTASSIUM CHLORIDE 2 MEQ/ML IV SOLN
30.0000 meq | Freq: Once | INTRAVENOUS | Status: AC
  Administered 2017-02-10: 30 meq via INTRAVENOUS
  Filled 2017-02-10: qty 15

## 2017-02-10 MED ORDER — AMIODARONE HCL IN DEXTROSE 360-4.14 MG/200ML-% IV SOLN
30.0000 mg/h | INTRAVENOUS | Status: DC
  Administered 2017-02-10 – 2017-02-11 (×3): 30 mg/h via INTRAVENOUS
  Filled 2017-02-10 (×3): qty 200

## 2017-02-10 MED ORDER — AMIODARONE LOAD VIA INFUSION
150.0000 mg | Freq: Once | INTRAVENOUS | Status: AC
  Administered 2017-02-10: 150 mg via INTRAVENOUS
  Filled 2017-02-10: qty 83.34

## 2017-02-10 MED ORDER — MAGNESIUM SULFATE IN D5W 1-5 GM/100ML-% IV SOLN
1.0000 g | Freq: Once | INTRAVENOUS | Status: AC
  Administered 2017-02-10: 1 g via INTRAVENOUS
  Filled 2017-02-10: qty 100

## 2017-02-10 MED ORDER — DILTIAZEM HCL 100 MG IV SOLR
5.0000 mg/h | INTRAVENOUS | Status: DC
  Administered 2017-02-11: 5 mg/h via INTRAVENOUS
  Filled 2017-02-10: qty 100

## 2017-02-10 MED ORDER — SODIUM CHLORIDE 0.9 % IV SOLN
30.0000 meq | Freq: Once | INTRAVENOUS | Status: AC
  Administered 2017-02-10: 30 meq via INTRAVENOUS
  Filled 2017-02-10: qty 15

## 2017-02-10 NOTE — Progress Notes (Signed)
5 Days Post-Op Procedure(s) (LRB): CORONARY ARTERY BYPASS GRAFTING (CABG) x 4                                                                               LIMA-LAD SEQ SVG-DIAG-OM SVG-PD (N/A) TRANSESOPHAGEAL ECHOCARDIOGRAM (TEE) (N/A) MITRAL VALVE REPLACEMENT (MVR) WITH MAGNA MITRAL EASE PERICARDIAL BIOPROSTHESIS 25 MM (N/A) Subjective:  Extubated yesterday afternoon and has been stable overnight. Kept NPO   More alert today. No specific complaints.  Objective: Vital signs in last 24 hours: Temp:  [97.5 F (36.4 C)-98.8 F (37.1 C)] 98.5 F (36.9 C) (02/20 0400) Pulse Rate:  [88-161] 110 (02/20 0700) Cardiac Rhythm: Sinus tachycardia (02/20 0400) Resp:  [12-28] 23 (02/20 0700) BP: (85-165)/(50-99) 153/94 (02/20 0700) SpO2:  [100 %] 100 % (02/20 0700) FiO2 (%):  [40 %] 40 % (02/19 1105) Weight:  [80.9 kg (178 lb 5.6 oz)] 80.9 kg (178 lb 5.6 oz) (02/20 0500)  Hemodynamic parameters for last 24 hours: PAP: (20-42)/(7-21) 32/15 CO:  [3.9 L/min-6.1 L/min] 6.1 L/min CI:  [2.1 L/min/m2-3.3 L/min/m2] 3.3 L/min/m2  Intake/Output from previous day: 02/19 0701 - 02/20 0700 In: 2448 [I.V.:1036.6; Blood:316.4; IV Piggyback:1095] Out: 4818 [Urine:3925; Chest Tube:120] Intake/Output this shift: No intake/output data recorded.  General appearance: cooperative and slowed mentation Neurologic: intact Heart: regular rate and rhythm, S1, S2 normal, no murmur, click, rub or gallop Lungs: diminished breath sounds bibasilar Abdomen: soft, non-tender; bowel sounds normal; no masses,  no organomegaly Extremities: edema mild Wound: incisions ok  Lab Results:  Recent Labs  02/09/17 0320  02/09/17 2215 02/10/17 0530  WBC 12.8*  --   --  11.5*  HGB 9.1*  < > 8.5* 8.5*  HCT 28.5*  < > 25.0* 25.3*  PLT 70*  --   --  108*  < > = values in this interval not displayed. BMET:  Recent Labs  02/09/17 0320 02/09/17 1607 02/09/17 2215 02/10/17 0530  NA 144 144 145 143  K 3.4* 3.5 3.8 3.9   CL 111 109  --  107  CO2 25  --   --  23  GLUCOSE 82 103* 127* 193*  BUN 37* 36*  --  42*  CREATININE 2.56* 2.40*  --  2.33*  CALCIUM 7.5*  --   --  8.3*    PT/INR: No results for input(s): LABPROT, INR in the last 72 hours. ABG    Component Value Date/Time   PHART 7.377 02/09/2017 1603   HCO3 23.0 02/09/2017 1603   TCO2 24 02/09/2017 1607   ACIDBASEDEF 2.0 02/09/2017 1603   O2SAT 73.8 02/10/2017 0534   CBG (last 3)   Recent Labs  02/09/17 1959 02/10/17 0016 02/10/17 0425  GLUCAP 104* 144* 155*   CXR: bibasilar atelectasis  Assessment/Plan: S/P Procedure(s) (LRB): CORONARY ARTERY BYPASS GRAFTING (CABG) x 4  LIMA-LAD SEQ SVG-DIAG-OM SVG-PD (N/A) TRANSESOPHAGEAL ECHOCARDIOGRAM (TEE) (N/A) MITRAL VALVE REPLACEMENT (MVR) WITH MAGNA MITRAL EASE PERICARDIAL BIOPROSTHESIS 25 MM (N/A)  Hypertensive since sedation off and extubated. Co-ox 74% this am. Milrinone decreased to 0.25. Hydralazine prn and clonidine patch ordered.  Respiratory status stable: bilateral LL atelectasis. Work on IS and flutter, [place].  Continue Rocephin for Strep pneumo in sputum  Acute renal failure continues to improve. Diuresing well  Will consult speech therapy for swallowing eval before starting PO.  DC pleural chest tubes   DC arterial line and follow cuff pressure.  Will need coumadin for tissue MV for three months. Start when taking po.   LOS: 7 days    [name] [name] 02/10/2017

## 2017-02-10 NOTE — Progress Notes (Signed)
SLP Cancellation Note  Patient Details Name: [name] [name] MRN: [id number] DOB: Nov 05, 1935   Cancelled treatment:        Arrived to assess pt for swallow ability. Pt sleepy, aroused to stimuli. Heart rate fluctuating 120's-130's and RR 30 up to 40 with increased work of breathing when head of bed raised. Deferred swallow eval at that time. Question if she will improve for return  this afternoon to assess or more appropriate tomorrow.    Houston Siren 02/10/2017, 10:44 AM   Orbie Pyo Colvin Caroli.Ed [employer] [phone]

## 2017-02-10 NOTE — Progress Notes (Signed)
Patient in A- Fib with RVR. Confirmed via EKG. Dr. Cyndia Bent called and made aware. New orders given. Will implement and continue to monitor.

## 2017-02-10 NOTE — Progress Notes (Signed)
CT surgery p.m. Rounds  Patient resting comfortably Urine output greater than 100 hour Potassium 3.3 will give 10 mEq IV as patient is nothing by mouth

## 2017-02-10 NOTE — Progress Notes (Signed)
Spoke with Dr. Nils Pyle regarding pt's hypertension. Orders received. I will continue to monitor.

## 2017-02-10 NOTE — Progress Notes (Signed)
PULMONARY / CRITICAL CARE MEDICINE   Name: [name] [name] MRN: [id number] DOB: Jul 09, 1935    ADMISSION DATE:  02/03/2017 CONSULTATION DATE:  02/04/2017  REFERRING MD:  Dr. Martinique  CHIEF COMPLAINT:  AMS brief:  82 year old female with past medical history as below, which is significant for CAD, diabetes mellitus, and hypertension. She presented to cardiologist to/13 with complaints of chest pain and shortness of breath while doing light housework onset about 3 days prior to arrival. She had an EKG done in the clinic, which was concerning for ischemia. She was referred for urgent cardiac cath which revealed three-vessel CAD. She was admitted in transfer to [facility] for urgent CABG which was scheduled for 2/15. After arrival to [facility] on 2/14 she was noted to be lethargic and hypotensive after doing well all day. She was in cardiogenic shock secondary to coronary artery disease requiring intubation, vasopressors, and emergent blood pressure support with intra-aortic balloon pump. The procedure was without complication, and she was sent to the ICU for recovery. PCCM to assist with ventilator and medical management. Patient then had CABG x 4 and MV replacement on 2/15.     SIGNIFICANT EVENTS: 2/14 admit for urgent CABG but pt in cardiogenic shock. Intubated.  2/15 CABG x 4, MV replacement   2/18 Resting on vent , sedation with precedex   Remains on Levophed, off Vasopressin  On IABP 1:1   2.19  - off IABP. On milrinone, levophed 33mcg, Off precedex and IABP. Not on sedatin gtt. Per RN CVTS aiing for rapid wean to extubate. 2 CTs removed /s p albumin and platelet transfusion 02/09/2017  SUBJECTIVE/OVERNIGHT/INTERVAL HX 2/20  - extubated yesterday. RN concerned about slow increasing tachycardia, tachypnea. Very deconditioned. Doing pulm toilet. On 2 LNC. Luiz Blare out. Chest tubes out. A line out. Rt IJ sleeve remains.    VITAL SIGNS: BP (!) 187/98   Pulse (!) 133   Temp 98.4 F  (36.9 C) (Oral)   Resp (!) 37   Ht 5\' 6"  (1.676 m)   Wt 80.9 kg (178 lb 5.6 oz)   SpO2 100%   BMI 28.79 kg/m   HEMODYNAMICS: PAP: (24-42)/(7-21) 32/15 CO:  [4.6 L/min-6.1 L/min] 6.1 L/min CI:  [2.5 L/min/m2-3.3 L/min/m2] 3.3 L/min/m2  VENTILATOR SETTINGS: Vent Mode: PSV;CPAP FiO2 (%):  [40 %] 40 % Set Rate:  [4 bmp] 4 bmp Vt Set:  [500 mL] 500 mL PEEP:  [5 cmH20] 5 cmH20 Pressure Support:  [10 cmH20] 10 cmH20  INTAKE / OUTPUT: I/O last 3 completed shifts: In: 3236.4 [I.V.:1735; Blood:316.4; NG/GT:90; IV Piggyback:1095] Out: 0017 [Urine:5065; Emesis/NG output:100; Chest Tube:480]  PHYSICAL EXAMINATION:  General Appearance:    Looks chronic criticall ill OBESE - no  Head:    Normocephalic, without obvious abnormality, atraumatic  Eyes:    PERRL - yes, conjunctiva/corneas - muddy      Ears:    Normal external ear canals, both ears  Nose:   NG tube - no  Throat:  ETT TUBE - no , OG tube - no  Neck:   Supple,  No enlargement/tenderness/nodules     Lungs:     Clear to auscultation bilaterally, Ventilator   Synchrony - n/a; not on vent  Chest wall:    No deformity  Heart:    S1 and S2 normal, no murmur, CVP - na.  Pressors - on milrione and ntg gtt  Abdomen:     Soft, no masses, no organomegaly  Genitalia:    Not done  Rectal:   not done  Extremities:   Extremities- mild edema     Skin:   Intact in exposed areas . Sacral area - none per rn     Neurologic:   Sedation - prn -> RASS - 0 . Moves all 4s - yes. CAM-ICU - not tested . Orientation - not tested bu alert and  nods      LABS:  PULMONARY  Recent Labs Lab 02/06/17 0948 02/06/17 1658 02/07/17 0330  02/09/17 0330 02/09/17 1127 02/09/17 1420 02/09/17 1603 02/09/17 1607 02/10/17 0534  PHART 7.353  --  7.312*  --   --  7.371 7.377 7.377  --   --   PCO2ART 36.2  --  39.8  --   --  41.0 41.3 39.1  --   --   PO2ART 134.0*  --  102  --   --  115.0* 103.0 75.0*  --   --   HCO3 20.1  --  19.5*  --   --  23.9  24.4 23.0  --   --   TCO2 21 22  --   --   --  25 26 24 24   --   O2SAT 99.0  --  97.0  81.5  < > 72.8 98.0 98.0 95.0  --  73.8  < > = values in this interval not displayed.  CBC  Recent Labs Lab 02/08/17 0334 02/09/17 0320 02/09/17 1607 02/09/17 2215 02/10/17 0530  HGB 9.0* 9.1* 8.2* 8.5* 8.5*  HCT 28.3* 28.5* 24.0* 25.0* 25.3*  WBC 14.4* 12.8*  --   --  11.5*  PLT 78* 70*  --   --  108*    COAGULATION  Recent Labs Lab 02/03/17 1118 02/05/17 1844  INR 1.11 1.87    CARDIAC    Recent Labs Lab 02/03/17 1118  TROPONINI 6.48*   No results for input(s): PROBNP in the last 168 hours.   CHEMISTRY  Recent Labs Lab 02/05/17 0930  02/06/17 1636  02/07/17 0454 02/07/17 0456 02/07/17 0843 02/08/17 0334 02/09/17 0320 02/09/17 1607 02/09/17 2215 02/10/17 0530  NA  --   < >  --   < > 141 140  --  142 144 144 145 143  K  --   < >  --   < > 3.7 3.6  --  3.6 3.4* 3.5 3.8 3.9  CL  --   < >  --   < > 110 111  --  111 111 109  --  107  CO2  --   < >  --   --  20* 20*  --  22 25  --   --  23  GLUCOSE  --   < >  --   < > 164* 169*  --  109* 82 103* 127* 193*  BUN  --   < >  --   < > 28* 29*  --  32* 37* 36*  --  42*  CREATININE  --   < > 2.94*  < > 2.98* 2.85*  --  2.78* 2.56* 2.40*  --  2.33*  CALCIUM  --   < >  --   --  7.3* 7.2*  --  7.3* 7.5*  --   --  8.3*  MG 1.8  < > 1.8  --   --   --  1.6* 1.9 1.8  --   --  1.9  PHOS 2.6  --   --   --   --   --   --   --  3.7  --   --  3.3  < > = values in this interval not displayed. Estimated Creatinine Clearance: 21 mL/min (by C-G formula based on SCr of 2.33 mg/dL (H)).   LIVER  Recent Labs Lab 02/03/17 1118  02/05/17 1844 02/07/17 0454 02/07/17 0456 02/08/17 0334 02/09/17 0320 02/10/17 0530  AST  --   < >  --  243* 211* 112* 64* 50*  ALT  --   < >  --  240* 220* 176* 128* 90*  ALKPHOS  --   < >  --  61 57 66 68 74  BILITOT  --   < >  --  3.4* 3.0* 3.4* 4.0* 5.5*  PROT  --   < >  --  4.1* 3.8* 4.5* 4.3* 5.1*   ALBUMIN  --   < >  --  2.4* 2.3* 2.2* 2.1* 2.9*  INR 1.11  --  1.87  --   --   --   --   --   < > = values in this interval not displayed.   INFECTIOUS  Recent Labs Lab 02/05/17 0835  LATICACIDVEN 1.6     ENDOCRINE CBG (last 3)   Recent Labs  02/10/17 0016 02/10/17 0425 02/10/17 0819  GLUCAP 144* 155* 201*         IMAGING x48h  - image(s) personally visualized  -   highlighted in bold Dg Chest Port 1 View  Result Date: 02/10/2017 CLINICAL DATA:  82 year old female status post CABG. Initial encounter. EXAM: PORTABLE CHEST 1 VIEW COMPARISON:  02/09/2017 and earlier. FINDINGS: Portable AP semi upright view at 0551 hours. Extubated. Enteric tube removed. Mediastinal tube removed. Bilateral chest tubes remain in place. Swan-Ganz catheter removed, right IJ introducer sheath remains in place. Stable lung volumes. No pneumothorax or pulmonary edema. Patchy and confluent left lung base opacity probably mostly due to atelectasis. Possible small effusion. Stable cardiomegaly and mediastinal contours. Sequelae of cardiac valve replacement. Tortuous thoracic aorta. Calcified aortic atherosclerosis. IMPRESSION: 1. Extubated.  Stable lung volumes. 2. Enteric tube, Swan-Ganz catheter, and mediastinal tubes removed. Bilateral chest tubes and right IJ introducer sheath remain. 3. No pneumothorax or pulmonary edema. Left lung base atelectasis and possible small pleural effusion. Electronically Signed   By: Genevie Ann M.D.   On: 02/10/2017 08:04   Dg Chest Port 1 View  Result Date: 02/09/2017 CLINICAL DATA:  Respiratory failure, intubated patient. Status post CABG and mitral valve repair 5 days ago. History of diabetes EXAM: PORTABLE CHEST 1 VIEW COMPARISON:  Portable chest x-ray of February 08, 2017 FINDINGS: The lungs are slightly better inflated today. There remains increased density at the left lung base. There is no large pleural effusion and no pneumothorax. The cardiac silhouette remains  enlarged. The pulmonary vascularity is less engorged. There is tortuosity of the ascending and descending thoracic aorta. An intra aortic balloon pump is present with the tip projecting over the aortic arch slightly higher than before but this may be affected by patient positioning. The Swan-Ganz catheter tip projects in the proximal left main pulmonary artery. The endotracheal tube tip lies approximately 2.8 cm above the carina. The esophagogastric tube and tip and proximal port lie below the GE junction. The right sided chest tube tip projects over the posterior 6 rib. A mediastinal drain projects at approximately the level of the carina. The 2 left lower chest tubes are in stable position. IMPRESSION: Improved aeration of both lungs with decreased interstitial edema. Persistent left lower lobe atelectasis  or pneumonia. Small left pleural effusion likely is present. No pneumothorax. Cardiomegaly with decreased pulmonary vascular congestion. The support tubes and devices are in stable position. Please note that the tip of the intra-aortic balloon pump appears slightly higher than that seen on the previous study but is similar to that seen on the study of 07 February 2017. Electronically Signed   By: David  Martinique M.D.   On: 02/09/2017 07:23       STUDIES:  LHC 2/13 > significant three-vessel coronary artery disease, including sequential 70% 80% proximal and mid LAD stenoses, mid left circumflex lesion, and chronic subtotal occlusion of the mid RCA. Echo 2/13 > LVEF 40-45%, hypokinesis of the anterior myocardium, akinesis of the anterolateral and inferior lateral myocardium. Grade 2 diastolic dysfunction.  CULTURES: MRSA 2/14 > (-) Trache asp 2/14 >abund strep pneum>   ANTIBIOTICS: Cefuroxime 2/14 > 2/15 Vanc 2/14 > 2/15 2/17 Rocephin >>  DISCUSSION: 89F, with severe 3VD, in cardiogenic shock.  Initial plan was for CABG but it is on hold as she is too sick for CABG. Intubated. With IABP. On  pressors. Patient subsequently had CABG x 4 and MV replacement on 2/15.   ASSESSMENT / PLAN:  PULMONARY A: #baseline:  reports that she has never smoked. She has never used smokeless tobacco.  #current: s/p acute post op resp failure on vent due to cardiogenic shioc. Extubated 2/19  02/10/2017 -> remains extubated but RN concerned about increasing atelectasis due to deconditioning  P:   pulm toile Mobilize Keep any eye; intubate if needed   CARDIOVASCULAR A:  #baseline: x #current cardiogenic shock  02/10/2017 -> impprving  P:  Per cvts and cards  RENAL  Intake/Output Summary (Last 24 hours) at 02/10/17 0923 Last data filed at 02/10/17 0903  Gross per 24 hour  Intake          2151.62 ml  Output             3930 ml  Net         -1778.38 ml    Recent Labs Lab 02/07/17 0456 02/08/17 0334 02/09/17 0320 02/09/17 1607 02/10/17 0530  CREATININE 2.85* 2.78* 2.56* 2.40* 2.33*     A:   #baseline: *x #current AKI - improving  02/10/2017 -> improving aki with lasix but mag < 2gm%  P:   Diuresis per cards/cvts Replete mag  GASTROINTESTINAL A:   On NPOpost extubation 02/10/2017   P:   Speech eval ongoing Remains NPO  HEMATOLOGIC  Recent Labs Lab 02/08/17 0334 02/09/17 0320 02/09/17 1607 02/09/17 2215 02/10/17 0530  HGB 9.0* 9.1* 8.2* 8.5* 8.5*  HCT 28.3* 28.5* 24.0* 25.0* 25.3*  WBC 14.4* 12.8*  --   --  11.5*  PLT 78* 70*  --   --  108*    A:   #RBC: anemia stable #Platelet low platelet imprving #WBC nil acute  P:  - PRBC for hgb </= 6.9gm%    - exceptions are   -  if ACS susepcted/confirmed then transfuse for hgb </= 8.0gm%,  or    -  active bleeding with hemodynamic instability, then transfuse regardless of hemoglobin value   At at all times try to transfuse 1 unit prbc as possible with exception of active hemorrhage    INFECTIOUS No results for input(s): PROCALCITON in the last 168 hours.  Results for orders placed or performed  during the hospital encounter of 02/03/17  Surgical pcr screen     Status: None   Collection  Time: 02/04/17  9:56 PM  Result Value Ref Range Status   MRSA, PCR NEGATIVE NEGATIVE Final   Staphylococcus aureus NEGATIVE NEGATIVE Final    Comment:        The Xpert SA Assay (FDA approved for NASAL specimens in patients over 76 years of age), is one component of a comprehensive surveillance program.  Test performance has been validated by [facility] for patients greater than or equal to 63 year old. It is not intended to diagnose infection nor to guide or monitor treatment.   Culture, respiratory (NON-Expectorated)     Status: None   Collection Time: 02/05/17  9:44 AM  Result Value Ref Range Status   Specimen Description TRACHEAL ASPIRATE  Final   Special Requests Normal  Final   Gram Stain   Final    ABUNDANT WBC PRESENT,BOTH PMN AND MONONUCLEAR ABUNDANT GRAM POSITIVE COCCI IN PAIRS MODERATE GRAM NEGATIVE COCCI IN PAIRS FEW GRAM NEGATIVE RODS    Culture ABUNDANT STREPTOCOCCUS PNEUMONIAE  Final   Report Status 02/09/2017 FINAL  Final   Organism ID, Bacteria STREPTOCOCCUS PNEUMONIAE  Final      Susceptibility   Streptococcus pneumoniae - MIC*    ERYTHROMYCIN 2 RESISTANT Resistant     LEVOFLOXACIN 0.5 SENSITIVE Sensitive     PENICILLIN (meningitis) <=0.06 SENSITIVE Sensitive     PENICILLIN (non-meningitis) <=0.06 SENSITIVE Sensitive     CEFTRIAXONE (non-meningitis) <=0.12 SENSITIVE Sensitive     CEFTRIAXONE (meningitis) <=0.12 SENSITIVE Sensitive     * ABUNDANT STREPTOCOCCUS PNEUMONIAE  Culture, Urine     Status: None   Collection Time: 02/07/17 10:20 AM  Result Value Ref Range Status   Specimen Description URINE, CATHETERIZED  Final   Special Requests NONE  Final   Culture NO GROWTH  Final   Report Status 02/08/2017 FINAL  Final    A:   Strep pneumonia bronchitis P:   Anti-infectives    Start     Dose/Rate Route Frequency Ordered Stop   02/07/17 1000  cefUROXime  (ZINACEF) 1.5 g in dextrose 5 % 50 mL IVPB     1.5 g 100 mL/hr over 30 Minutes Intravenous Every 24 hours 02/06/17 1810 02/07/17 0937   02/07/17 0815  cefTRIAXone (ROCEPHIN) 1 g in dextrose 5 % 50 mL IVPB     1 g 100 mL/hr over 30 Minutes Intravenous Every 24 hours 02/07/17 0811 02/14/17 0814   02/06/17 1100  vancomycin (VANCOCIN) IVPB 1000 mg/200 mL premix  Status:  Discontinued     1,000 mg 200 mL/hr over 60 Minutes Intravenous  Once 02/05/17 1847 02/06/17 0759   02/06/17 0400  vancomycin (VANCOCIN) 1,250 mg in sodium chloride 0.9 % 250 mL IVPB  Status:  Discontinued     1,250 mg 166.7 mL/hr over 90 Minutes Intravenous To Surgery 02/05/17 1012 02/05/17 1027   02/06/17 0400  cefUROXime (ZINACEF) 1.5 g in dextrose 5 % 50 mL IVPB  Status:  Discontinued     1.5 g 100 mL/hr over 30 Minutes Intravenous To Surgery 02/05/17 1012 02/05/17 1027   02/06/17 0400  cefUROXime (ZINACEF) 750 mg in dextrose 5 % 50 mL IVPB  Status:  Discontinued     750 mg 100 mL/hr over 30 Minutes Intravenous To Surgery 02/05/17 0952 02/05/17 0955   02/05/17 2300  cefUROXime (ZINACEF) 1.5 g in dextrose 5 % 50 mL IVPB  Status:  Discontinued     1.5 g 100 mL/hr over 30 Minutes Intravenous Every 12 hours 02/05/17 1847 02/06/17 1810  02/05/17 1000  vancomycin (VANCOCIN) 1,250 mg in sodium chloride 0.9 % 250 mL IVPB     1,250 mg 166.7 mL/hr over 90 Minutes Intravenous To Surgery 02/05/17 0947 02/05/17 1115   02/05/17 1000  cefUROXime (ZINACEF) 1.5 g in dextrose 5 % 50 mL IVPB     1.5 g 100 mL/hr over 30 Minutes Intravenous To Surgery 02/05/17 0947 02/05/17 1712   02/05/17 1000  cefUROXime (ZINACEF) 750 mg in dextrose 5 % 50 mL IVPB  Status:  Discontinued     750 mg 100 mL/hr over 30 Minutes Intravenous To Surgery 02/05/17 0947 02/05/17 1840   02/05/17 0400  vancomycin (VANCOCIN) 1,250 mg in sodium chloride 0.9 % 250 mL IVPB  Status:  Discontinued     1,250 mg 166.7 mL/hr over 90 Minutes Intravenous To Surgery 02/04/17  1443 02/05/17 0904   02/05/17 0400  cefUROXime (ZINACEF) 1.5 g in dextrose 5 % 50 mL IVPB  Status:  Discontinued     1.5 g 100 mL/hr over 30 Minutes Intravenous To Surgery 02/04/17 1443 02/05/17 0904   02/05/17 0400  cefUROXime (ZINACEF) 750 mg in dextrose 5 % 50 mL IVPB  Status:  Discontinued     750 mg 100 mL/hr over 30 Minutes Intravenous To Surgery 02/04/17 1443 02/05/17 0904       ENDOCRINE A:   hyperglcemia   P:   ICU hyperglycemia protocol  NEUROLOGIC A:   #Baseline : nil reported  #Current: seems intact. buit deconditioned   02/10/2017 -> nil acute P:   RASS goal: 0 No sedation gtt   FAMILY  - Updates: 02/10/2017 --> none at bedside  - Inter-disciplinary family meet or Palliative Care meeting due by:  DAy 7. Current LOS is LOS 7 days   DISPO Keep in ICU per CVTS  PCCM will sign off but will keep an eye on resp status. Abx end date given   Dr. Brand Males, M.D., [facility].C.P Pulmonary and Critical Care Medicine Staff Physician [place] [facility] Pager: [phone], If no answer or between  15:00h - 7:00h: call [phone]  02/10/2017 9:23 AM         ASSESSMENT / PLAN:  PULMONARY A: Acute resp failure due to cadiac issues P:   SBT as tolerated Extubate when patient meets criteria per CVTS protocol  CARDIOVASCULAR A:  Cardiogenic shock P:  Per cards and cvts  RENAL A:   AKI hypomag hpokal P:   Replete k and mag monitor  GASTROINTESTINAL A:   NPO P:   TF if not extubation 02/10/2017   HEMATOLOGIC A:   Anemia of critical illness P:  - PRBC for hgb </= 8gm%    - exceptions are   - active bleeding with hemodynamic instability, then transfuse regardless of hemoglobin value   At at all times try to transfuse 1 unit prbc as possible with exception of active hemorrhage    INFECTIOUS A:   Strep pneumonia sputum ? bronchitis P:   Anti-infectives    Start     Dose/Rate Route  Frequency Ordered Stop   02/07/17 1000  cefUROXime (ZINACEF) 1.5 g in dextrose 5 % 50 mL IVPB     1.5 g 100 mL/hr over 30 Minutes Intravenous Every 24 hours 02/06/17 1810 02/07/17 0937   02/07/17 0815  cefTRIAXone (ROCEPHIN) 1 g in dextrose 5 % 50 mL IVPB     1 g 100 mL/hr over 30 Minutes Intravenous Every 24  hours 02/07/17 0811     02/06/17 1100  vancomycin (VANCOCIN) IVPB 1000 mg/200 mL premix  Status:  Discontinued     1,000 mg 200 mL/hr over 60 Minutes Intravenous  Once 02/05/17 1847 02/06/17 0759   02/06/17 0400  vancomycin (VANCOCIN) 1,250 mg in sodium chloride 0.9 % 250 mL IVPB  Status:  Discontinued     1,250 mg 166.7 mL/hr over 90 Minutes Intravenous To Surgery 02/05/17 1012 02/05/17 1027   02/06/17 0400  cefUROXime (ZINACEF) 1.5 g in dextrose 5 % 50 mL IVPB  Status:  Discontinued     1.5 g 100 mL/hr over 30 Minutes Intravenous To Surgery 02/05/17 1012 02/05/17 1027   02/06/17 0400  cefUROXime (ZINACEF) 750 mg in dextrose 5 % 50 mL IVPB  Status:  Discontinued     750 mg 100 mL/hr over 30 Minutes Intravenous To Surgery 02/05/17 0952 02/05/17 0955   02/05/17 2300  cefUROXime (ZINACEF) 1.5 g in dextrose 5 % 50 mL IVPB  Status:  Discontinued     1.5 g 100 mL/hr over 30 Minutes Intravenous Every 12 hours 02/05/17 1847 02/06/17 1810   02/05/17 1000  vancomycin (VANCOCIN) 1,250 mg in sodium chloride 0.9 % 250 mL IVPB     1,250 mg 166.7 mL/hr over 90 Minutes Intravenous To Surgery 02/05/17 0947 02/05/17 1115   02/05/17 1000  cefUROXime (ZINACEF) 1.5 g in dextrose 5 % 50 mL IVPB     1.5 g 100 mL/hr over 30 Minutes Intravenous To Surgery 02/05/17 0947 02/05/17 1712   02/05/17 1000  cefUROXime (ZINACEF) 750 mg in dextrose 5 % 50 mL IVPB  Status:  Discontinued     750 mg 100 mL/hr over 30 Minutes Intravenous To Surgery 02/05/17 0947 02/05/17 1840   02/05/17 0400  vancomycin (VANCOCIN) 1,250 mg in sodium chloride 0.9 % 250 mL IVPB  Status:  Discontinued     1,250 mg 166.7 mL/hr over 90  Minutes Intravenous To Surgery 02/04/17 1443 02/05/17 0904   02/05/17 0400  cefUROXime (ZINACEF) 1.5 g in dextrose 5 % 50 mL IVPB  Status:  Discontinued     1.5 g 100 mL/hr over 30 Minutes Intravenous To Surgery 02/04/17 1443 02/05/17 0904   02/05/17 0400  cefUROXime (ZINACEF) 750 mg in dextrose 5 % 50 mL IVPB  Status:  Discontinued     750 mg 100 mL/hr over 30 Minutes Intravenous To Surgery 02/04/17 1443 02/05/17 0904       ENDOCRINE A:   DM P:   ssi  NEUROLOGIC A:   Acute enceph - rass --3 off precedex but RN reports ability to follow comands P:   RASS goal: 0 mnitor   FAMILY  - Updates:  None at bedside 02/10/2017    The patient is critically ill with multiple organ systems failure and requires high complexity decision making for assessment and support, frequent evaluation and titration of therapies, application of advanced monitoring technologies and extensive interpretation of multiple databases.   Critical Care Time devoted to patient care services described in this note is  30  Minutes. This time reflects time of care of this signee Dr Brand Males. This critical care time does not reflect procedure time, or teaching time or supervisory time of PA/NP/Med student/Med Resident etc but could involve care discussion time    Dr. Brand Males, M.D., [facility].C.P Pulmonary and Critical Care Medicine Staff Physician [place] [facility] Pager: [phone], If no answer or between  15:00h -  7:00h: call [phone]  02/10/2017 9:23 AM

## 2017-02-10 NOTE — Evaluation (Signed)
Clinical/Bedside Swallow Evaluation Patient Details  Name: [name] [name] MRN: [id number] Date of Birth: 1935-09-04  Today's Date: 02/10/2017 Time: SLP Start Time (ACUTE ONLY): 1427 SLP Stop Time (ACUTE ONLY): 1449 SLP Time Calculation (min) (ACUTE ONLY): 22 min  Past Medical History:  Past Medical History:  Diagnosis Date  . [name]    a. LHC 02/03/17 for unstable angina showed severe 3v CAD w/ sequential 70% & 80% p-mLAD, 99% mLCx, CTO mRCA, elevated LV filling pressure  . Colon polyp 2010  . Diabetes mellitus with complication ([place])   . Glaucoma   . Hypertension    Past Surgical History:  Past Surgical History:  Procedure Laterality Date  . APPENDECTOMY    . BREAST BIOPSY Right 1999  . COLONOSCOPY  2010  . CORONARY ARTERY BYPASS GRAFT N/A 02/05/2017   Procedure: CORONARY ARTERY BYPASS GRAFTING (CABG) x 4                                                                               LIMA-LAD SEQ SVG-DIAG-OM SVG-PD;  Surgeon: Gaye Pollack, MD;  Location: [facility];  Service: Open Heart Surgery;  Laterality: N/A;  . IABP INSERTION N/A 02/04/2017   Procedure: IABP Insertion;  Surgeon: Leonie Man, MD;  Location: [facility];  Service: Cardiovascular;  Laterality: N/A;  . LEFT HEART CATH AND CORONARY ANGIOGRAPHY Left 02/03/2017   Procedure: Left Heart Cath and Coronary Angiography;  Surgeon: Nelva Bush, MD;  Location: [facility];  Service: Cardiovascular;  Laterality: Left;  . MITRAL VALVE REPAIR N/A 02/05/2017   Procedure: MITRAL VALVE REPLACEMENT (MVR) WITH MAGNA MITRAL EASE PERICARDIAL BIOPROSTHESIS 25 MM;  Surgeon: Gaye Pollack, MD;  Location: [facility];  Service: Open Heart Surgery;  Laterality: N/A;  . PARATHYROIDECTOMY     1986  . RIGHT HEART CATH N/A 02/04/2017   Procedure: Right Heart Cath;  Surgeon: Leonie Man, MD;  Location: [facility];  Service: Cardiovascular;  Laterality: N/A;  . TEE WITHOUT CARDIOVERSION N/A 02/05/2017    Procedure: TRANSESOPHAGEAL ECHOCARDIOGRAM (TEE);  Surgeon: Gaye Pollack, MD;  Location: [facility];  Service: Open Heart Surgery;  Laterality: N/A;   HPI:  [name] [name] is a 82 year old woman with history of type 2 diabetes mellitus and hypertension, who presented to cardiology clinic due to acute chest pain that began 3 days ago while doing light housework. EKG showed abnormalities concerning for ischemia. Urgent cardiac cath revealed three-vessel CAD, including serial 70 and 80% proximal/mid LAD stenoses. Underwent CABG x 4 2/15 and extubated 2/19. CXR No pneumothorax or pulmonary edema. Left lung base atelectasis and possible small pleural effusion.   Assessment / Plan / Recommendation Clinical Impression  [name] was lethargic, deconditioned, and required moderate cueing to attend to POs. Generalized oral weakness noted during oral motor assessment, likely due to decreased alertness. Observed pt with thin liquids via cup which resulted in immediate and delayed coughing/throat clearing. Trials of pureed solids did not result in outward indications of airway compromise at bedside. RN administered crushed meds in puree; pt did not appear to have any difficulty. Due to deconditioned state, hx of prolonged intubation, and lethargy,  pt is at risk for aspiration. Recommend continuing NPO-except crushed meds in puree. ST will f/u closely for treatment to assess swallowing mechanism and for likely objective swallowing evaluation.   SLP Visit Diagnosis: Dysphagia, oropharyngeal phase (R13.12)    Aspiration Risk  Moderate aspiration risk    Diet Recommendation NPO except meds   Medication Administration: Crushed with puree Supervision: Full supervision/cueing for compensatory strategies;Staff to assist with self feeding    Other  Recommendations Oral Care Recommendations: Oral care QID   Follow up Recommendations Other (comment) (TBD)      Frequency and Duration min 2x/week  2 weeks        Prognosis Prognosis for Safe Diet Advancement: Good      Swallow Study   General HPI: [name] [name] is a 82 year old woman with history of type 2 diabetes mellitus and hypertension, who presented to cardiology clinic due to acute chest pain that began 3 days ago while doing light housework. EKG showed abnormalities concerning for ischemia. Urgent cardiac cath revealed three-vessel CAD, including serial 70 and 80% proximal/mid LAD stenoses. Underwent CABG x 4 2/15 and extubated 2/19. CXR No pneumothorax or pulmonary edema. Left lung base atelectasis and possible small pleural effusion. Type of Study: Bedside Swallow Evaluation Previous Swallow Assessment:  (none) Diet Prior to this Study: NPO Temperature Spikes Noted: No Respiratory Status: Room air History of Recent Intubation: Yes Length of Intubations (days): 5 days Date extubated: 02/09/17 Behavior/Cognition: Cooperative;Lethargic/Drowsy;Requires cueing Oral Cavity Assessment: Dry Oral Care Completed by SLP: No Oral Cavity - Dentition: Missing dentition;Poor condition Vision: Functional for self-feeding Self-Feeding Abilities: Needs set up;Needs assist Patient Positioning: Upright in chair Baseline Vocal Quality: Low vocal intensity Volitional Cough: Weak Volitional Swallow: Able to elicit (weak)    Oral/Motor/Sensory Function Overall Oral Motor/Sensory Function: Generalized oral weakness Facial ROM: Within Functional Limits Facial Symmetry: Abnormal symmetry right Facial Strength: Reduced right;Reduced left Lingual ROM: Within Functional Limits Lingual Symmetry: Within Functional Limits Lingual Strength: Within Functional Limits Velum: Within Functional Limits   Ice Chips Ice chips: Not tested   Thin Liquid Thin Liquid: Impaired Presentation: Cup Oral Phase Impairments:  (none) Oral Phase Functional Implications:  (none) Pharyngeal  Phase Impairments: Cough - Immediate;Cough - Delayed;Throat Clearing - Immediate;Throat  Clearing - Delayed    Nectar Thick Nectar Thick Liquid: Not tested   Honey Thick Honey Thick Liquid: Not tested   Puree Puree: Within functional limits Presentation: Spoon   Solid   GO   Solid: Not tested        Fransisca Kaufmann , Student-SLP 02/10/2017,3:20 PM

## 2017-02-10 NOTE — Progress Notes (Signed)
Patient ID: [name] [name], female   DOB: [birth year], 82 y.o.   MRN: [id number]     Advanced Heart Failure Rounding Note  PCP:  Primary Cardiologist:   Subjective:    S/p emergent CABG x 4 and bioprosthetic MVR for LCX NSTEMI with flail MV on 2/15  Extubated and IABP out yesterday. Swan out  Good urine output -3.5L. Weight down 5 pounds. But still a bit dyspneic. BP very high with systolics > 876. Now on clonidine patch and IV NTG at 100.  Renal function improving.  Co-ox 74%.  CXR wet    Objective:   Weight Range: 80.3 kg (177 lb 0.5 oz) Body mass index is 28.57 kg/m.   Vital Signs:   Temp:  [97.5 F (36.4 C)-98.8 F (37.1 C)] 98.5 F (36.9 C) (02/20 0400) Pulse Rate:  [88-161] 108 (02/20 0600) Resp:  [12-28] 23 (02/20 0600) BP: (85-165)/(46-99) 148/88 (02/20 0600) SpO2:  [100 %] 100 % (02/20 0600) FiO2 (%):  [40 %] 40 % (02/19 1105) Weight:  [80.3 kg (177 lb 0.5 oz)] 80.3 kg (177 lb 0.5 oz) (02/20 0500) Last BM Date:  (Prior to surgery)  Weight change: Filed Weights   02/08/17 0500 02/09/17 0500 02/10/17 0500  Weight: 81 kg (178 lb 9.2 oz) 82.9 kg (182 lb 12.2 oz) 80.3 kg (177 lb 0.5 oz)    Intake/Output:   Intake/Output Summary (Last 24 hours) at 02/10/17 0648 Last data filed at 02/10/17 0600  Gross per 24 hour  Intake          2451.03 ml  Output             3645 ml  Net         -1193.97 ml     Physical Exam: General:  Lying in bed awake. Communicative. Breathless  HEENT: normal Neck: supple. RIJ swan Carotids 2+ bilat; no bruits. No lymphadenopathy or thyromegaly appreciated. Cor: PMI nondisplaced. Regular tachy . No M/G/R noted.   Lungs: crackles anteriorly  Abdomen: soft, ND, no HSM. No bruits or masses. +BS  Extremities: no cyanosis, clubbing, rash. Trace ankle edema.  Neuro: Awake. Alert and oriented. Moves all 4  Telemetry: Reviewed, sinus tach 105 + PVCs  Labs: CBC  Recent Labs  02/09/17 0320  02/09/17 2215 02/10/17 0530  WBC 12.8*   --   --  11.5*  HGB 9.1*  < > 8.5* 8.5*  HCT 28.5*  < > 25.0* 25.3*  MCV 78.1  --   --  77.6*  PLT 70*  --   --  108*  < > = values in this interval not displayed. Basic Metabolic Panel  Recent Labs  02/09/17 0320 02/09/17 1607 02/09/17 2215 02/10/17 0530  NA 144 144 145 143  K 3.4* 3.5 3.8 3.9  CL 111 109  --  107  CO2 25  --   --  23  GLUCOSE 82 103* 127* 193*  BUN 37* 36*  --  42*  CREATININE 2.56* 2.40*  --  2.33*  CALCIUM 7.5*  --   --  8.3*  MG 1.8  --   --  1.9  PHOS 3.7  --   --  3.3   Liver Function Tests  Recent Labs  02/09/17 0320 02/10/17 0530  AST 64* 50*  ALT 128* 90*  ALKPHOS 68 74  BILITOT 4.0* 5.5*  PROT 4.3* 5.1*  ALBUMIN 2.1* 2.9*   No results for input(s): LIPASE, AMYLASE in the last 72 hours. Cardiac Enzymes  No results for input(s): CKTOTAL, CKMB, CKMBINDEX, TROPONINI in the last 72 hours.  BNP: BNP (last 3 results) No results for input(s): BNP in the last 8760 hours.  ProBNP (last 3 results) No results for input(s): PROBNP in the last 8760 hours.   D-Dimer No results for input(s): DDIMER in the last 72 hours. Hemoglobin A1C No results for input(s): HGBA1C in the last 72 hours. Fasting Lipid Panel No results for input(s): CHOL, HDL, LDLCALC, TRIG, CHOLHDL, LDLDIRECT in the last 72 hours. Thyroid Function Tests No results for input(s): TSH, T4TOTAL, T3FREE, THYROIDAB in the last 72 hours.  Invalid input(s): FREET3  Other results:     Imaging/Studies:  No results found.    Medications:     Scheduled Medications: . sodium chloride   Intravenous Once  . aspirin EC  325 mg Oral Daily   Or  . aspirin  324 mg Per Tube Daily  . atorvastatin  80 mg Per Tube q1800  . cefTRIAXone (ROCEPHIN)  IV  1 g Intravenous Q24H  . chlorhexidine gluconate (MEDLINE KIT)  15 mL Mouth Rinse BID  . cloNIDine  0.2 mg Transdermal Weekly  . famotidine (PEPCID) IV  20 mg Intravenous Q24H  . insulin aspart  0-15 Units Subcutaneous Q4H  . mouth  rinse  15 mL Mouth Rinse QID  . sodium chloride flush  3 mL Intravenous Q12H    Infusions: . sodium chloride Stopped (02/09/17 1800)  . sodium chloride    . sodium chloride Stopped (02/09/17 1800)  . lactated ringers Stopped (02/06/17 0100)  . lactated ringers 10 mL/hr at 02/10/17 0400  . milrinone 0.375 mcg/kg/min (02/10/17 0400)  . nitroGLYCERIN 100 mcg/min (02/10/17 0400)    PRN Medications: sodium chloride, fentaNYL (SUBLIMAZE) injection, metoprolol, ondansetron (ZOFRAN) IV, sodium chloride flush   Assessment/Plan   1. CAD [name] on 2/15 2. Severe MR with flail segment s/p MVR c/ bioprosthetic valve. 3. Cardiogenic shock: Echo pre-op with EF 40-45%, lateral akinesis, severe MR.  4. AKI 5. Shock Liver 6. Acute respiratory failure with hypoxemia requiring intubation 7. Thrombocytopenia: Post-op. 8. Tracheal aspirate with Strep pneumoniae  Now extubated. IABP out. Off norepi. Now BP very high despite clonidine and IV NTG gtt. CXR wet. Co-ox ok. Renal function improving.   Will start milrinone wean. Will diurese today. Start hydralazine 17m q8 + IV doses PRN.   Maintaining NSR though rate up. Will repeat echo later this week to reassess EF and MV.   Tracheal aspirate with Strep pneumo. Treating for tracheobronchitis with ceftriaxone. Continue incentive spirometry and pulmonary toilet.    Christien Frankl,MD 6:48 AM  02/10/2017

## 2017-02-11 ENCOUNTER — Inpatient Hospital Stay (HOSPITAL_COMMUNITY): Payer: Medicare Other

## 2017-02-11 DIAGNOSIS — I340 Nonrheumatic mitral (valve) insufficiency: Secondary | ICD-10-CM

## 2017-02-11 LAB — GLUCOSE, CAPILLARY
GLUCOSE-CAPILLARY: 135 mg/dL — AB (ref 65–99)
GLUCOSE-CAPILLARY: 154 mg/dL — AB (ref 65–99)
GLUCOSE-CAPILLARY: 168 mg/dL — AB (ref 65–99)
Glucose-Capillary: 169 mg/dL — ABNORMAL HIGH (ref 65–99)
Glucose-Capillary: 158 mg/dL — ABNORMAL HIGH (ref 65–99)
Glucose-Capillary: 159 mg/dL — ABNORMAL HIGH (ref 65–99)

## 2017-02-11 LAB — POCT I-STAT, CHEM 8
BUN: 48 mg/dL — AB (ref 6–20)
CHLORIDE: 107 mmol/L (ref 101–111)
CREATININE: 3 mg/dL — AB (ref 0.44–1.00)
Calcium, Ion: 1.08 mmol/L — ABNORMAL LOW (ref 1.15–1.40)
Glucose, Bld: 149 mg/dL — ABNORMAL HIGH (ref 65–99)
HEMATOCRIT: 28 % — AB (ref 36.0–46.0)
Hemoglobin: 9.5 g/dL — ABNORMAL LOW (ref 12.0–15.0)
POTASSIUM: 3.7 mmol/L (ref 3.5–5.1)
SODIUM: 145 mmol/L (ref 135–145)
TCO2: 26 mmol/L (ref 0–100)

## 2017-02-11 LAB — CBC
HCT: 26.2 % — ABNORMAL LOW (ref 36.0–46.0)
HEMOGLOBIN: 8.7 g/dL — AB (ref 12.0–15.0)
MCH: 25 pg — ABNORMAL LOW (ref 26.0–34.0)
MCHC: 33.2 g/dL (ref 30.0–36.0)
MCV: 75.3 fL — ABNORMAL LOW (ref 78.0–100.0)
Platelets: 170 10*3/uL (ref 150–400)
RBC: 3.48 MIL/uL — ABNORMAL LOW (ref 3.87–5.11)
RDW: 21.3 % — ABNORMAL HIGH (ref 11.5–15.5)
WBC: 16.2 10*3/uL — AB (ref 4.0–10.5)

## 2017-02-11 LAB — COMPREHENSIVE METABOLIC PANEL
ALBUMIN: 3 g/dL — AB (ref 3.5–5.0)
ALK PHOS: 97 U/L (ref 38–126)
ALT: 85 U/L — ABNORMAL HIGH (ref 14–54)
ANION GAP: 12 (ref 5–15)
AST: 70 U/L — ABNORMAL HIGH (ref 15–41)
BUN: 43 mg/dL — ABNORMAL HIGH (ref 6–20)
CALCIUM: 8.6 mg/dL — AB (ref 8.9–10.3)
CO2: 26 mmol/L (ref 22–32)
Chloride: 104 mmol/L (ref 101–111)
Creatinine, Ser: 2.2 mg/dL — ABNORMAL HIGH (ref 0.44–1.00)
GFR calc Af Amer: 23 mL/min — ABNORMAL LOW (ref >60)
GFR calc non Af Amer: 20 mL/min — ABNORMAL LOW (ref >60)
GLUCOSE: 183 mg/dL — AB (ref 65–99)
Potassium: 3.4 mmol/L — ABNORMAL LOW (ref 3.5–5.1)
Sodium: 142 mmol/L (ref 135–145)
TOTAL PROTEIN: 5.1 g/dL — AB (ref 6.5–8.1)
Total Bilirubin: 5.9 mg/dL — ABNORMAL HIGH (ref 0.3–1.2)

## 2017-02-11 LAB — COOXEMETRY PANEL
CARBOXYHEMOGLOBIN: 1.2 % (ref 0.5–1.5)
Methemoglobin: 1.2 % (ref 0.0–1.5)
O2 SAT: 63.3 %
Total hemoglobin: 9 g/dL — ABNORMAL LOW (ref 12.0–16.0)

## 2017-02-11 LAB — BASIC METABOLIC PANEL
ANION GAP: 13 (ref 5–15)
BUN: 54 mg/dL — AB (ref 6–20)
CHLORIDE: 107 mmol/L (ref 101–111)
CO2: 25 mmol/L (ref 22–32)
Calcium: 8.8 mg/dL — ABNORMAL LOW (ref 8.9–10.3)
Creatinine, Ser: 2.58 mg/dL — ABNORMAL HIGH (ref 0.44–1.00)
GFR calc Af Amer: 19 mL/min — ABNORMAL LOW (ref >60)
GFR, EST NON AFRICAN AMERICAN: 17 mL/min — AB (ref >60)
GLUCOSE: 151 mg/dL — AB (ref 65–99)
POTASSIUM: 3.7 mmol/L (ref 3.5–5.1)
Sodium: 145 mmol/L (ref 135–145)

## 2017-02-11 LAB — MAGNESIUM: MAGNESIUM: 2 mg/dL (ref 1.7–2.4)

## 2017-02-11 LAB — ECHOCARDIOGRAM LIMITED
Height: 66 in
Weight: 2797.2 oz

## 2017-02-11 LAB — PHOSPHORUS: PHOSPHORUS: 1.5 mg/dL — AB (ref 2.5–4.6)

## 2017-02-11 MED ORDER — DEXTROSE-NACL 5-0.45 % IV SOLN
INTRAVENOUS | Status: DC
  Administered 2017-02-11 – 2017-02-15 (×3): via INTRAVENOUS
  Administered 2017-02-16: 50 mL/h via INTRAVENOUS

## 2017-02-11 MED ORDER — WARFARIN SODIUM 5 MG PO TABS
5.0000 mg | ORAL_TABLET | Freq: Once | ORAL | Status: AC
  Administered 2017-02-11: 5 mg via ORAL
  Filled 2017-02-11: qty 1

## 2017-02-11 MED ORDER — SODIUM CHLORIDE 0.9 % IV SOLN
30.0000 meq | Freq: Once | INTRAVENOUS | Status: AC
  Administered 2017-02-11: 30 meq via INTRAVENOUS
  Filled 2017-02-11: qty 15

## 2017-02-11 MED ORDER — CHLORHEXIDINE GLUCONATE CLOTH 2 % EX PADS
6.0000 | MEDICATED_PAD | Freq: Every day | CUTANEOUS | Status: DC
  Administered 2017-02-11 – 2017-02-19 (×9): 6 via TOPICAL

## 2017-02-11 MED ORDER — HYDRALAZINE HCL 25 MG PO TABS
25.0000 mg | ORAL_TABLET | Freq: Three times a day (TID) | ORAL | Status: DC
  Administered 2017-02-11 – 2017-02-12 (×2): 25 mg via ORAL
  Filled 2017-02-11 (×4): qty 1

## 2017-02-11 MED ORDER — WARFARIN - PHYSICIAN DOSING INPATIENT
Freq: Every day | Status: DC
  Administered 2017-02-13 – 2017-03-03 (×7)

## 2017-02-11 MED ORDER — METOPROLOL TARTRATE 25 MG PO TABS
25.0000 mg | ORAL_TABLET | Freq: Two times a day (BID) | ORAL | Status: DC
  Administered 2017-02-11 – 2017-02-13 (×6): 25 mg via ORAL
  Filled 2017-02-11 (×7): qty 1

## 2017-02-11 MED ORDER — MILRINONE LACTATE IN DEXTROSE 20-5 MG/100ML-% IV SOLN
0.1250 ug/kg/min | INTRAVENOUS | Status: DC
  Administered 2017-02-11: 0.125 ug/kg/min via INTRAVENOUS
  Filled 2017-02-11: qty 100

## 2017-02-11 MED ORDER — SODIUM CHLORIDE 0.9% FLUSH: 10.0000 mL | INTRAVENOUS | Status: DC | PRN

## 2017-02-11 MED ORDER — DEXTROSE 5 % IV SOLN
20.0000 meq | Freq: Once | INTRAVENOUS | Status: AC
Start: 2017-02-11 — End: 2017-02-12
  Administered 2017-02-11: 20 meq via INTRAVENOUS
  Filled 2017-02-11: qty 4.55

## 2017-02-11 MED ORDER — DEXTROSE 5 % IV SOLN
2.0000 g | INTRAVENOUS | Status: DC
  Administered 2017-02-12: 2 g via INTRAVENOUS
  Filled 2017-02-11: qty 2

## 2017-02-11 MED ORDER — SODIUM CHLORIDE 0.9% FLUSH
10.0000 mL | Freq: Two times a day (BID) | INTRAVENOUS | Status: DC
  Administered 2017-02-11: 20 mL
  Administered 2017-02-15 – 2017-02-18 (×6): 10 mL

## 2017-02-11 NOTE — Progress Notes (Signed)
  Echocardiogram 2D Echocardiogram has been performed.  Jennette Dubin 02/11/2017, 2:24 PM

## 2017-02-11 NOTE — Care Management Note (Addendum)
Case Management Note  Patient Details  Name: [name] [name] MRN: [id number] Date of Birth: 1935/07/01  Subjective/Objective:   Pt lives with and is primary caregiver for husband who has dementia and is wheelchair bound.       02/12/17 1:12 PM:  Met with pt's daughter who states she is trying to place pt's husband in a memory care facility until pt can provide care for him again.  Daughter lives in [place], [place], and plans to return next Tuesday.  Discussed discharge options - pt and daughter  both think ST-SNF will be best option as pt will be alone when discharged - request [facility] as preferred facility.                 Expected Discharge Plan:  Skilled Nursing Facility  In-House Referral:  Clinical Social Work  Discharge planning Services  CM Consult   Status of Service:  In process, will continue to follow  Girard Cooter, RN 02/11/2017, 2:52 PM

## 2017-02-11 NOTE — Progress Notes (Signed)
  Speech Language Pathology Treatment: Dysphagia  Patient Details Name: [name] [name] MRN: [id number] DOB: 1935-11-27 Today's Date: 02/11/2017 Time: 2951-8841 SLP Time Calculation (min) (ACUTE ONLY): 8 min  Assessment / Plan / Recommendation Clinical Impression  Skilled observation with po trials complete to determine readiness for instrumental testing or initiation of a po diet. Patient upright in chair, verbalizing not feeling well this am but unable to report specifics. Significant s/s of aspiration with thin liquid trials persist, likely related to prolonged intubation. Although patient without overt s/s of aspiration with pureed solids, patient with significant oral holding, requiring max verbal, tactile, and visual cueing for initiation of swallow. RN informed. Continue current plan as patient not appropriate for diet advancement at this time.     HPI HPI: [name] [name] is a 82 year old woman with history of type 2 diabetes mellitus and hypertension, who presented to cardiology clinic due to acute chest pain that began 3 days ago while doing light housework. EKG showed abnormalities concerning for ischemia. Urgent cardiac cath revealed three-vessel CAD, including serial 70 and 80% proximal/mid LAD stenoses. Underwent CABG x 4 2/15 and extubated 2/19. CXR No pneumothorax or pulmonary edema. Left lung base atelectasis and possible small pleural effusion.      SLP Plan  Continue with current plan of care       Recommendations  Diet recommendations: NPO Medication Administration: Crushed with puree                Oral Care Recommendations: Oral care QID SLP Visit Diagnosis: Dysphagia, oropharyngeal phase (R13.12) Plan: Continue with current plan of care       [facility] [place], [place] [phone]    [name] [name] 02/11/2017, 10:43 AM

## 2017-02-11 NOTE — Progress Notes (Signed)
Inpatient Diabetes Program Recommendations  AACE/ADA: New Consensus Statement on Inpatient Glycemic Control (2015)  Target Ranges:  Prepandial:   less than 140 mg/dL      Peak postprandial:   less than 180 mg/dL (1-2 hours)      Critically ill patients:  140 - 180 mg/dL   Lab Results  Component Value Date   GLUCAP 154 (H) 02/11/2017   HGBA1C 6.3 (H) 02/03/2017    Review of Glycemic ControlResults for KAYDAN, WILHOITE (MRN [id number]) as of 02/11/2017 12:15  Ref. Range 02/10/2017 16:15 02/10/2017 19:24 02/10/2017 23:31 02/11/2017 03:48 02/11/2017 08:25  Glucose-Capillary Latest Ref Range: 65 - 99 mg/dL 180 (H) 210 (H) 228 (H) 168 (H) 154 (H)   Inpatient Diabetes Program Recommendations:    Consider adding Levemir 8 units daily.    Thanks, Adah Perl, RN, BC-ADM Inpatient Diabetes Coordinator Pager [phone] (8a-5p)

## 2017-02-11 NOTE — Progress Notes (Signed)
6 Days Post-Op Procedure(s) (LRB): CORONARY ARTERY BYPASS GRAFTING (CABG) x 4                                                                               LIMA-LAD SEQ SVG-DIAG-OM SVG-PD (N/A) TRANSESOPHAGEAL ECHOCARDIOGRAM (TEE) (N/A) MITRAL VALVE REPLACEMENT (MVR) WITH MAGNA MITRAL EASE PERICARDIAL BIOPROSTHESIS 25 MM (N/A) Subjective:  No complaints  Objective: Vital signs in last 24 hours: Temp:  [97.8 F (36.6 C)-98.6 F (37 C)] 97.8 F (36.6 C) (02/21 0700) Pulse Rate:  [68-143] 82 (02/21 0800) Cardiac Rhythm: Normal sinus rhythm (02/21 0500) Resp:  [20-37] 24 (02/21 0800) BP: (106-187)/(48-105) 116/61 (02/21 0800) SpO2:  [92 %-100 %] 94 % (02/21 0800) Weight:  [79.3 kg (174 lb 13.2 oz)] 79.3 kg (174 lb 13.2 oz) (02/21 0500)  Hemodynamic parameters for last 24 hours:    Intake/Output from previous day: 02/20 0701 - 02/21 0700 In: 2148.6 [I.V.:1418.6; IV Piggyback:730] Out: 4295 [Urine:4275; Chest Tube:20] Intake/Output this shift: No intake/output data recorded.  General appearance: cooperative and slowed mentation Neurologic: intact Heart: irregularly irregular rhythm Lungs: diminished breath sounds bibasilar Extremities: edema mild Wound: incision ok  Lab Results:  Recent Labs  02/10/17 0530 02/10/17 1822 02/11/17 0338  WBC 11.5*  --  16.2*  HGB 8.5* 8.8* 8.7*  HCT 25.3* 26.0* 26.2*  PLT 108*  --  170   BMET:  Recent Labs  02/10/17 0530 02/10/17 1822 02/11/17 0338  NA 143 142 142  K 3.9 3.4* 3.4*  CL 107  --  104  CO2 23  --  26  GLUCOSE 193* 255* 183*  BUN 42*  --  43*  CREATININE 2.33*  --  2.20*  CALCIUM 8.3*  --  8.6*    PT/INR: No results for input(s): LABPROT, INR in the last 72 hours. ABG    Component Value Date/Time   PHART 7.377 02/09/2017 1603   HCO3 23.0 02/09/2017 1603   TCO2 24 02/09/2017 1607   ACIDBASEDEF 2.0 02/09/2017 1603   O2SAT 63.3 02/11/2017 0340   CBG (last 3)   Recent Labs  02/10/17 2331 02/11/17 0348  02/11/17 0825  GLUCAP 228* 168* 154*    Assessment/Plan: S/P Procedure(s) (LRB): CORONARY ARTERY BYPASS GRAFTING (CABG) x 4                                                                               LIMA-LAD SEQ SVG-DIAG-OM SVG-PD (N/A) TRANSESOPHAGEAL ECHOCARDIOGRAM (TEE) (N/A) MITRAL VALVE REPLACEMENT (MVR) WITH MAGNA MITRAL EASE PERICARDIAL BIOPROSTHESIS 25 MM (N/A)  She is hemodynamically stable on milrinone which was turn down to 0.125 this am since Co-ox 63%. Antihypertensives adjusted by Dr. Haroldine Laws this am.  Postop atrial fibrillation yesterday. Remains in atrial fib with controlled rate on amiodarone.  Start coumadin today for atrial fib and MVR.  Acute renal failure continues to improve. Weight decreasing with diuresis.  Bilateral lower lobe  atelectasis on CXR: continue IS, flutter. Mobilize, PT consult.  Continue Rocephin for strep pneumo bronchitis.  Mild leukocytosis but increasing. No fever. Will insert PICC and remove sleeve. Urine culture negative.  DM: continue SSI  Diet: speech therapy follow up.   LOS: 8 days    [name] [name] 02/11/2017

## 2017-02-11 NOTE — Progress Notes (Signed)
      [address]       [place] [address]             [phone]      POD # 6 CABG MVR  Resting comfortably  BP 137/73   Pulse 98   Temp 98.3 F (36.8 C)   Resp (!) 35   Ht 5\' 6"  (1.676 m)   Wt 174 lb 13.2 oz (79.3 kg)   SpO2 94%   BMI 28.22 kg/m  Rate controlled atrial fib  Intake/Output Summary (Last 24 hours) at 02/11/17 1828 Last data filed at 02/11/17 1700  Gross per 24 hour  Intake           1595.5 ml  Output             1133 ml  Net            462.5 ml   Creatinine = 2.58 up from 2.20, lytes OK  On milrinone at 0.125, amiodarone at 30, dilt off  Continue present care  George Mason C. Roxan Hockey, MD [facility] [phone]

## 2017-02-11 NOTE — Progress Notes (Addendum)
Patient ID: [name] [name], female   DOB: 02-Oct-1935, 82 y.o.   MRN: [id number]     Advanced Heart Failure Rounding Note  PCP:  Primary Cardiologist:   Subjective:    S/p emergent CABG x 4 and bioprosthetic MVR for LCX NSTEMI with flail MV on 2/15  Sitting up in chair. Feels better but wants to eat. In/out AF. Now on IV amio and cardizem. Off IV NTG.   Diuresed well but now slowing down. Weight down 4 pounds.  Breathing ok. Co-ox 63% on milrinone 0.25  K 3.4. Cr 2.3 -> 2.2  Objective:   Weight Range: 79.3 kg (174 lb 13.2 oz) Body mass index is 28.22 kg/m.   Vital Signs:   Temp:  [97.8 F (36.6 C)-98.6 F (37 C)] 98.3 F (36.8 C) (02/21 0400) Pulse Rate:  [68-143] 94 (02/21 0600) Resp:  [20-37] 37 (02/21 0600) BP: (106-187)/(48-105) 128/68 (02/21 0600) SpO2:  [92 %-100 %] 93 % (02/21 0600) Weight:  [79.3 kg (174 lb 13.2 oz)] 79.3 kg (174 lb 13.2 oz) (02/21 0500) Last BM Date:  (Prior to surgery)  Weight change: Filed Weights   02/09/17 0500 02/10/17 0500 02/11/17 0500  Weight: 82.9 kg (182 lb 12.2 oz) 80.9 kg (178 lb 5.6 oz) 79.3 kg (174 lb 13.2 oz)    Intake/Output:   Intake/Output Summary (Last 24 hours) at 02/11/17 0647 Last data filed at 02/11/17 0600  Gross per 24 hour  Intake          2197.32 ml  Output             4695 ml  Net         -2497.68 ml     Physical Exam: General:  Sitting in chair. Follows commands but somewhat lethargic HEENT: normal Neck: supple. RIJ cordis Carotids 2+ bilat; no bruits. No lymphadenopathy or thyromegaly appreciated. Cor: PMI nondisplaced. irregular. No M/G/R noted.   Lungs: crackles at basese  Abdomen: soft, ND, no HSM. No bruits or masses. +BS  Extremities: no cyanosis, clubbing, rash. T1+ edema.  Neuro: A bit lethargic but follows commands and is oriented. Moves all 4  Telemetry: Reviewed, in/out AF 90->110  Labs: CBC  Recent Labs  02/10/17 0530 02/10/17 1822 02/11/17 0338  WBC 11.5*  --  16.2*  HGB 8.5*  8.8* 8.7*  HCT 25.3* 26.0* 26.2*  MCV 77.6*  --  75.3*  PLT 108*  --  300   Basic Metabolic Panel  Recent Labs  02/10/17 0530 02/10/17 1822 02/11/17 0338  NA 143 142 142  K 3.9 3.4* 3.4*  CL 107  --  104  CO2 23  --  26  GLUCOSE 193* 255* 183*  BUN 42*  --  43*  CREATININE 2.33*  --  2.20*  CALCIUM 8.3*  --  8.6*  MG 1.9  --  2.0  PHOS 3.3  --  1.5*   Liver Function Tests  Recent Labs  02/10/17 0530 02/11/17 0338  AST 50* 70*  ALT 90* 85*  ALKPHOS 74 97  BILITOT 5.5* 5.9*  PROT 5.1* 5.1*  ALBUMIN 2.9* 3.0*   No results for input(s): LIPASE, AMYLASE in the last 72 hours. Cardiac Enzymes No results for input(s): CKTOTAL, CKMB, CKMBINDEX, TROPONINI in the last 72 hours.  BNP: BNP (last 3 results) No results for input(s): BNP in the last 8760 hours.  ProBNP (last 3 results) No results for input(s): PROBNP in the last 8760 hours.   D-Dimer No results for input(s):  DDIMER in the last 72 hours. Hemoglobin A1C No results for input(s): HGBA1C in the last 72 hours. Fasting Lipid Panel No results for input(s): CHOL, HDL, LDLCALC, TRIG, CHOLHDL, LDLDIRECT in the last 72 hours. Thyroid Function Tests No results for input(s): TSH, T4TOTAL, T3FREE, THYROIDAB in the last 72 hours.  Invalid input(s): FREET3  Other results:     Imaging/Studies:  No results found.    Medications:     Scheduled Medications: . sodium chloride   Intravenous Once  . aspirin EC  325 mg Oral Daily   Or  . aspirin  324 mg Per Tube Daily  . atorvastatin  80 mg Oral q1800  . cefTRIAXone (ROCEPHIN)  IV  1 g Intravenous Q24H  . chlorhexidine  15 mL Mouth Rinse BID  . cloNIDine  0.2 mg Transdermal Weekly  . famotidine (PEPCID) IV  20 mg Intravenous Q24H  . furosemide  80 mg Intravenous BID  . hydrALAZINE  50 mg Oral Q8H  . insulin aspart  0-15 Units Subcutaneous Q4H  . mouth rinse  15 mL Mouth Rinse q12n4p  . potassium chloride  20 mEq Oral BID  . sodium chloride flush  3 mL  Intravenous Q12H    Infusions: . sodium chloride    . amiodarone 30 mg/hr (02/11/17 0007)  . diltiazem (CARDIZEM) infusion 5 mg/hr (02/11/17 0014)  . lactated ringers Stopped (02/06/17 0100)  . lactated ringers 10 mL/hr at 02/10/17 1900  . milrinone 0.25 mcg/kg/min (02/10/17 1900)  . nitroGLYCERIN Stopped (02/11/17 0415)    PRN Medications: fentaNYL (SUBLIMAZE) injection, hydrALAZINE, metoprolol, ondansetron (ZOFRAN) IV, sodium chloride flush   Assessment/Plan   1. CAD [name] on 2/15 2. Severe MR with flail segment s/p MVR c/ bioprosthetic valve. 3. Cardiogenic shock: Echo pre-op with EF 40-45%, lateral akinesis, severe MR.  4. AKI 5. Shock Liver 6. Acute respiratory failure with hypoxemia requiring intubation 7. Thrombocytopenia: Post-op. 8. Tracheal aspirate with Strep pneumoniae 9. PAF  Continues to improve slowly. Diuresing fairly well. Will continue IV lasix one more day. Drop milrinone to 0.125.   BP now down. Stop clonidine, Cut hydral to 25 tid. Only give if SBR 130 or greater.   In/out AF. Continue IV amio. Swtich cardizem to lopressor. Supp K.   Will repeat limited echo today to reassess EF and MV.   Tracheal aspirate with Strep pneumo. Treating for tracheobronchitis with ceftriaxone. Continue incentive spirometry and pulmonary toilet. Ambulate   Bensimhon, Daniel,MD 6:47 AM  02/11/2017

## 2017-02-11 NOTE — Progress Notes (Signed)
Peripherally Inserted Central Catheter/Midline Placement  The IV Nurse has discussed with the patient and/or persons authorized to consent for the patient, the purpose of this procedure and the potential benefits and risks involved with this procedure.  The benefits include less needle sticks, lab draws from the catheter, and the patient may be discharged home with the catheter. Risks include, but not limited to, infection, bleeding, blood clot (thrombus formation), and puncture of an artery; nerve damage and irregular heartbeat and possibility to perform a PICC exchange if needed/ordered by physician.  Alternatives to this procedure were also discussed.  Bard Power PICC patient education guide, fact sheet on infection prevention and patient information card has been provided to patient /or left at bedside.    PICC/Midline Placement Documentation  PICC Double Lumen 02/11/17 PICC Right Brachial 38 cm (Active)  Indication for Insertion or Continuance of Line Prolonged intravenous therapies 02/11/2017 11:00 AM  Dressing Change Due 02/18/17 02/11/2017 11:00 AM    Telephone consent   [name] [name] 02/11/2017, 11:43 AM

## 2017-02-12 ENCOUNTER — Inpatient Hospital Stay (HOSPITAL_COMMUNITY): Payer: Medicare Other

## 2017-02-12 LAB — COMPREHENSIVE METABOLIC PANEL
ALBUMIN: 2.7 g/dL — AB (ref 3.5–5.0)
ALT: 76 U/L — ABNORMAL HIGH (ref 14–54)
AST: 75 U/L — AB (ref 15–41)
Alkaline Phosphatase: 118 U/L (ref 38–126)
Anion gap: 12 (ref 5–15)
BILIRUBIN TOTAL: 5 mg/dL — AB (ref 0.3–1.2)
BUN: 59 mg/dL — AB (ref 6–20)
CALCIUM: 8.4 mg/dL — AB (ref 8.9–10.3)
CO2: 26 mmol/L (ref 22–32)
Chloride: 106 mmol/L (ref 101–111)
Creatinine, Ser: 2.64 mg/dL — ABNORMAL HIGH (ref 0.44–1.00)
GFR calc Af Amer: 19 mL/min — ABNORMAL LOW (ref >60)
GFR calc non Af Amer: 16 mL/min — ABNORMAL LOW (ref >60)
GLUCOSE: 234 mg/dL — AB (ref 65–99)
POTASSIUM: 3.4 mmol/L — AB (ref 3.5–5.1)
Sodium: 144 mmol/L (ref 135–145)
TOTAL PROTEIN: 5 g/dL — AB (ref 6.5–8.1)

## 2017-02-12 LAB — COOXEMETRY PANEL
CARBOXYHEMOGLOBIN: 0.9 % (ref 0.5–1.5)
Methemoglobin: 1.3 % (ref 0.0–1.5)
O2 SAT: 60.9 %
Total hemoglobin: 9 g/dL — ABNORMAL LOW (ref 12.0–16.0)

## 2017-02-12 LAB — URINALYSIS, ROUTINE W REFLEX MICROSCOPIC
Bacteria, UA: NONE SEEN
Bilirubin Urine: NEGATIVE
Glucose, UA: NEGATIVE mg/dL
Ketones, ur: NEGATIVE mg/dL
Nitrite: NEGATIVE
PROTEIN: 30 mg/dL — AB
SPECIFIC GRAVITY, URINE: 1.017 (ref 1.005–1.030)
pH: 5 (ref 5.0–8.0)

## 2017-02-12 LAB — GLUCOSE, CAPILLARY
GLUCOSE-CAPILLARY: 136 mg/dL — AB (ref 65–99)
GLUCOSE-CAPILLARY: 160 mg/dL — AB (ref 65–99)
GLUCOSE-CAPILLARY: 132 mg/dL — AB (ref 65–99)
Glucose-Capillary: 116 mg/dL — ABNORMAL HIGH (ref 65–99)
Glucose-Capillary: 122 mg/dL — ABNORMAL HIGH (ref 65–99)
Glucose-Capillary: 127 mg/dL — ABNORMAL HIGH (ref 65–99)

## 2017-02-12 LAB — PROTIME-INR
INR: 1.42
PROTHROMBIN TIME: 17.5 s — AB (ref 11.4–15.2)

## 2017-02-12 LAB — CBC
HEMATOCRIT: 26.8 % — AB (ref 36.0–46.0)
Hemoglobin: 8.8 g/dL — ABNORMAL LOW (ref 12.0–15.0)
MCH: 24.8 pg — ABNORMAL LOW (ref 26.0–34.0)
MCHC: 32.8 g/dL (ref 30.0–36.0)
MCV: 75.5 fL — ABNORMAL LOW (ref 78.0–100.0)
PLATELETS: 232 10*3/uL (ref 150–400)
RBC: 3.55 MIL/uL — ABNORMAL LOW (ref 3.87–5.11)
RDW: 22 % — AB (ref 11.5–15.5)
WBC: 21.2 10*3/uL — AB (ref 4.0–10.5)

## 2017-02-12 LAB — PHOSPHORUS: PHOSPHORUS: 2.9 mg/dL (ref 2.5–4.6)

## 2017-02-12 LAB — MAGNESIUM: MAGNESIUM: 2.1 mg/dL (ref 1.7–2.4)

## 2017-02-12 MED ORDER — VANCOMYCIN HCL 10 G IV SOLR
1500.0000 mg | Freq: Once | INTRAVENOUS | Status: AC
  Administered 2017-02-12: 1500 mg via INTRAVENOUS
  Filled 2017-02-12: qty 1500

## 2017-02-12 MED ORDER — AMIODARONE HCL 200 MG PO TABS
200.0000 mg | ORAL_TABLET | Freq: Two times a day (BID) | ORAL | Status: DC
  Administered 2017-02-12: 200 mg via ORAL
  Filled 2017-02-12: qty 1

## 2017-02-12 MED ORDER — ASPIRIN 81 MG PO CHEW
81.0000 mg | CHEWABLE_TABLET | Freq: Every day | ORAL | Status: DC
  Administered 2017-02-12 – 2017-02-19 (×8): 81 mg via ORAL
  Filled 2017-02-12 (×9): qty 1

## 2017-02-12 MED ORDER — FUROSEMIDE 40 MG PO TABS
40.0000 mg | ORAL_TABLET | Freq: Every day | ORAL | Status: DC
  Administered 2017-02-12: 40 mg via ORAL
  Filled 2017-02-12 (×2): qty 1

## 2017-02-12 MED ORDER — PIPERACILLIN-TAZOBACTAM 3.375 G IVPB
3.3750 g | Freq: Three times a day (TID) | INTRAVENOUS | Status: DC
  Administered 2017-02-12 – 2017-02-13 (×3): 3.375 g via INTRAVENOUS
  Filled 2017-02-12 (×4): qty 50

## 2017-02-12 MED ORDER — VANCOMYCIN HCL IN DEXTROSE 1-5 GM/200ML-% IV SOLN
1000.0000 mg | INTRAVENOUS | Status: DC
  Administered 2017-02-14: 1000 mg via INTRAVENOUS
  Filled 2017-02-12: qty 200

## 2017-02-12 MED ORDER — WARFARIN SODIUM 2.5 MG PO TABS
2.5000 mg | ORAL_TABLET | Freq: Once | ORAL | Status: AC
  Administered 2017-02-12: 2.5 mg via ORAL
  Filled 2017-02-12: qty 1

## 2017-02-12 MED ORDER — SODIUM CHLORIDE 0.9 % IV SOLN
30.0000 meq | Freq: Once | INTRAVENOUS | Status: AC
  Administered 2017-02-12: 30 meq via INTRAVENOUS
  Filled 2017-02-12: qty 15

## 2017-02-12 MED ORDER — AMIODARONE HCL IN DEXTROSE 360-4.14 MG/200ML-% IV SOLN
30.0000 mg/h | INTRAVENOUS | Status: DC
  Administered 2017-02-12 (×2): 30 mg/h via INTRAVENOUS
  Filled 2017-02-12 (×2): qty 200

## 2017-02-12 NOTE — Progress Notes (Signed)
7 Days Post-Op Procedure(s) (LRB): CORONARY ARTERY BYPASS GRAFTING (CABG) x 4                                                                               LIMA-LAD SEQ SVG-DIAG-OM SVG-PD (N/A) TRANSESOPHAGEAL ECHOCARDIOGRAM (TEE) (N/A) MITRAL VALVE REPLACEMENT (MVR) WITH MAGNA MITRAL EASE PERICARDIAL BIOPROSTHESIS 25 MM (N/A) Subjective:  No complaints  Objective: Vital signs in last 24 hours: Temp:  [98.1 F (36.7 C)-98.6 F (37 C)] 98.2 F (36.8 C) (02/22 0400) Pulse Rate:  [79-111] 98 (02/22 0700) Cardiac Rhythm: Normal sinus rhythm;Atrial fibrillation;Sinus tachycardia (02/22 0600) Resp:  [23-39] 34 (02/22 0700) BP: (116-152)/(56-110) 128/72 (02/22 0700) SpO2:  [92 %-97 %] 96 % (02/22 0700) Weight:  [80.4 kg (177 lb 4 oz)] 80.4 kg (177 lb 4 oz) (02/22 0500)  Hemodynamic parameters for last 24 hours:    Intake/Output from previous day: 02/21 0701 - 02/22 0700 In: 2215.2 [I.V.:1595.7; IV Piggyback:619.6] Out: 683 [Urine:683] Intake/Output this shift: No intake/output data recorded.  General appearance: cooperative, slowed mentation and confused. Thinks she is at home. Neurologic: nonfocal Heart: regular rate and rhythm, S1, S2 normal, no murmur, click, rub or gallop Lungs: clear to auscultation bilaterally Abdomen: soft, non-tender; bowel sounds normal; no masses,  no organomegaly Extremities: edema mild Wound: incisions ok  Lab Results:  Recent Labs  02/11/17 0338 02/11/17 1618 02/12/17 0402  WBC 16.2*  --  21.2*  HGB 8.7* 9.5* 8.8*  HCT 26.2* 28.0* 26.8*  PLT 170  --  232   BMET:  Recent Labs  02/11/17 1637 02/12/17 0402  NA 145 144  K 3.7 3.4*  CL 107 106  CO2 25 26  GLUCOSE 151* 234*  BUN 54* 59*  CREATININE 2.58* 2.64*  CALCIUM 8.8* 8.4*    PT/INR:  Recent Labs  02/12/17 0402  LABPROT 17.5*  INR 1.42   ABG    Component Value Date/Time   PHART 7.377 02/09/2017 1603   HCO3 23.0 02/09/2017 1603   TCO2 26 02/11/2017 1618   ACIDBASEDEF  2.0 02/09/2017 1603   O2SAT 60.9 02/12/2017 0410   CBG (last 3)   Recent Labs  02/11/17 1926 02/11/17 2352 02/12/17 0351  GLUCAP 159* 135* 136*   CLINICAL DATA:  Chest pain.  EXAM: PORTABLE CHEST 1 VIEW  COMPARISON:  February 11, 2017  FINDINGS: Cordis has been removed. Central catheter tip is in the superior vena cava. No pneumothorax. There is a small left pleural effusion. There is patchy consolidation in the left lower lobe. There is mild atelectatic change in the right mid and lower lung zones. There is cardiomegaly with pulmonary vascularity within normal limits. No adenopathy. Patient is status post mitral valve replacement. There is atherosclerotic calcification in the aorta. There are no evident bone lesions.  IMPRESSION: No pneumothorax. Patchy consolidation left lower lobe, in part due to atelectasis but raising concern for a degree of underlying pneumonia. Small left pleural effusion. Patchy atelectasis on the right, stable. Stable cardiomegaly.   Electronically Signed   By: Lowella Grip III M.D.   On: 02/12/2017 07:31  Assessment/Plan: S/P Procedure(s) (LRB): CORONARY ARTERY BYPASS GRAFTING (CABG) x 4  LIMA-LAD SEQ SVG-DIAG-OM SVG-PD (N/A) TRANSESOPHAGEAL ECHOCARDIOGRAM (TEE) (N/A) MITRAL VALVE REPLACEMENT (MVR) WITH MAGNA MITRAL EASE PERICARDIAL BIOPROSTHESIS 25 MM (N/A)  She is hemodynamically stable on Milrinone 0.125 with Co-ox 61%. Echo yesterday shows EF 40%.  Postop atrial fib: maintaining sinus on amio and lopressor. Will switch amio to po.  Started coumadin yesterday. Will give 2.5 mg today. INR 1.4.  Acute renal failure: creat was coming down and then bumped up from yesterday am to the afternoon and she did not respond to diuretic yesterday. I held lasix and gave her some fluid since she may be intravascularly dry. She may also be used to higher BP. Would  hold off on diuretic and observe.  Hypokalemia: replete.  Leukocytosis is worse today at 20K but afebrile. She has been on Rocephin for strep pneumo in sputum. CXR looks better to me and she is doing flutter and coughing up sputum. PICC line is new and all other lines are out. Foley in to monitor urine output so will check UA/culture. Previous one from 2/17 negative.  Shock liver dysfunction slowly improving.  She remains NPO except meds in applesauce. Speech therapy following.  Needs PT for ambulation. Consulted yesterday.  Confusion likely toxic/metabolic from advanced age, preop cardiogenic shock, prolonged ICU stay. Avoiding narcotics.   LOS: 9 days    Gaye Pollack 02/12/2017

## 2017-02-12 NOTE — Clinical Social Work Placement (Signed)
   CLINICAL SOCIAL WORK PLACEMENT  NOTE  Date:  02/12/2017  Patient Details  Name: ROMANA DEATON MRN: [id number] Date of Birth: [birth year]  Clinical Social Work is seeking post-discharge placement for this patient at the [place] level of care (*CSW will initial, date and re-position this form in  chart as items are completed):      Patient/family provided with [place] Work Department's list of facilities offering this level of care within the geographic area requested by the patient (or if unable, by the patient's family).      Patient/family informed of their freedom to choose among providers that offer the needed level of care, that participate in Medicare, Medicaid or managed care program needed by the patient, have an available bed and are willing to accept the patient.      Patient/family informed of [place]'s ownership interest in [facility] and [facility], as well as of the fact that they are under no obligation to receive care at these facilities.  PASRR submitted to EDS on       PASRR number received on       Existing PASRR number confirmed on       FL2 transmitted to all facilities in geographic area requested by pt/family on       FL2 transmitted to all facilities within larger geographic area on       Patient informed that his/her managed care company has contracts with or will negotiate with certain facilities, including the following:            Patient/family informed of bed offers received.  Patient chooses bed at       Physician recommends and patient chooses bed at      Patient to be transferred to   on  .  Patient to be transferred to facility by       Patient family notified on   of transfer.  Name of family member notified:        PHYSICIAN Please sign FL2     Additional Comment:    _______________________________________________ Wende Neighbors, LCSW 02/12/2017, 3:41 PM

## 2017-02-12 NOTE — Progress Notes (Signed)
Pharmacy Antibiotic Note  [name] [name] is a 82 y.o. female admitted on 02/03/2017 with pneumonia.  Pharmacy has been consulted for vancomycin and zosyn dosing.  Patient with strep pneumo bronchitis has been receiving ceftriaxone since 2/17. Patient now appears to have worsening pneumonia. No fevers noted, however wbc count is now up today. Will broaden antibiotic coverage this morning.   Plan: D/c ceftriaxone  Zosyn 3.375g IV q8 Vanc 1500mg  IV x1 today then start 1g q48 hours this weekend  Height: 5\' 6"  (167.6 cm) Weight: 177 lb 4 oz (80.4 kg) IBW/kg (Calculated) : 59.3  Temp (24hrs), Avg:98.2 F (36.8 C), Min:97.3 F (36.3 C), Max:98.6 F (37 C)   Recent Labs Lab 02/08/17 0334 02/09/17 0320  02/10/17 0530 02/11/17 0338 02/11/17 1618 02/11/17 1637 02/12/17 0402  WBC 14.4* 12.8*  --  11.5* 16.2*  --   --  21.2*  CREATININE 2.78* 2.56*  < > 2.33* 2.20* 3.00* 2.58* 2.64*  < > = values in this interval not displayed.  Estimated Creatinine Clearance: 18.5 mL/min (by C-G formula based on SCr of 2.64 mg/dL (H)).    No Known Allergies  Antimicrobials this admission: CTX 2/17 >>2/22 Vanc 2/22>> Zosyn 2/22>>  Dose adjustments this admission: N/a  Microbiology results: 2/22 BCx: sent 2/22 Urine:sent 2/17 UCx: neg 2/15 TA: strep pneumo S rocephin 2/14 MRSA PCR: neg  Thank you for allowing pharmacy to be a part of this patient's care.  Erin Hearing PharmD., BCPS Clinical Pharmacist Pager [phone] 02/12/2017 10:19 AM

## 2017-02-12 NOTE — Progress Notes (Signed)
TCTS BRIEF SICU PROGRESS NOTE  7 Days Post-Op  S/P Procedure(s) (LRB): CORONARY ARTERY BYPASS GRAFTING (CABG) x 4                                                                               LIMA-LAD SEQ SVG-DIAG-OM SVG-PD (N/A) TRANSESOPHAGEAL ECHOCARDIOGRAM (TEE) (N/A) MITRAL VALVE REPLACEMENT (MVR) WITH MAGNA MITRAL EASE PERICARDIAL BIOPROSTHESIS 25 MM (N/A)   Stable day Rate controlled AF w/ stable BP on IV amiodarone Breathing comfortably w/ O2 sats 98-100%  Plan: Continue current plan  Rexene Alberts, MD 02/12/2017 6:04 PM

## 2017-02-12 NOTE — Progress Notes (Signed)
SLP Cancellation Note  Patient Details Name: [name] [name] MRN: [id number] DOB: May 14, 1935   Cancelled treatment:      Chart reviewed and time spent consulting with RN.  Reason Eval/Treat Not Completed: Medical issues which prohibited therapy (Per RN, pt vomiting after meds this morning, currently lethargic following 45 ft walk with PT, and white count elevated today.  RN requests hold on po trials for today.) Will continue efforts as pt status improves.  Mystie Ormand [facility], CCC-SLP [phone]  Shonna Chock 02/12/2017, 10:07 AM

## 2017-02-12 NOTE — Progress Notes (Signed)
Patient ID: [name] [name], female   DOB: [birth year], 82 y.o.   MRN: [id number]     Advanced Heart Failure Rounding Note  PCP:  Primary Cardiologist:   Subjective:    S/p emergent CABG x 4 and bioprosthetic MVR for LCX NSTEMI with flail MV on 2/15  Yesterday milrinone was cut back and continued to diurese with IV lasix. Creatinine trending up. Failed swallow test.   Complaining of fatigue. Nauseated and vomitted this am. WBC increasing   Creatinine 2.6   Objective:   Weight Range: 177 lb 4 oz (80.4 kg) Body mass index is 28.61 kg/m.   Vital Signs:   Temp:  [97.3 F (36.3 C)-98.6 F (37 C)] 97.3 F (36.3 C) (02/22 0805) Pulse Rate:  [79-111] 98 (02/22 0800) Resp:  [23-39] 33 (02/22 0800) BP: (117-152)/(56-110) 135/78 (02/22 0800) SpO2:  [92 %-97 %] 94 % (02/22 0800) Weight:  [177 lb 4 oz (80.4 kg)] 177 lb 4 oz (80.4 kg) (02/22 0500) Last BM Date: 02/02/17  Weight change: Filed Weights   02/10/17 0500 02/11/17 0500 02/12/17 0500  Weight: 178 lb 5.6 oz (80.9 kg) 174 lb 13.2 oz (79.3 kg) 177 lb 4 oz (80.4 kg)    Intake/Output:   Intake/Output Summary (Last 24 hours) at 02/12/17 0836 Last data filed at 02/12/17 0800  Gross per 24 hour  Intake           2209.9 ml  Output              710 ml  Net           1499.9 ml     Physical Exam: General:  Sitting in chair. Fatigued appearing NAD HEENT: normal Neck: supple.  Carotids 2+ bilat; no bruits. No lymphadenopathy or thyromegaly appreciated. Cor: PMI nondisplaced. irregular. No M/G/R noted.   Lungs: crackles at basese Abdomen: soft, ND, no HSM. No bruits or masses. +BS  Extremities: no cyanosis, clubbing, rash. RUE PICC. RLE LLE 1 edema.  Neuro: A&Ox3 follows commands and is oriented. Moves all 4  Telemetry: Sinus Tach 105 but in and out A fib. Personally reviewed  Labs: CBC  Recent Labs  02/11/17 0338 02/11/17 1618 02/12/17 0402  WBC 16.2*  --  21.2*  HGB 8.7* 9.5* 8.8*  HCT 26.2* 28.0* 26.8*  MCV  75.3*  --  75.5*  PLT 170  --  001   Basic Metabolic Panel  Recent Labs  02/11/17 0338  02/11/17 1637 02/12/17 0402  NA 142  < > 145 144  K 3.4*  < > 3.7 3.4*  CL 104  < > 107 106  CO2 26  --  25 26  GLUCOSE 183*  < > 151* 234*  BUN 43*  < > 54* 59*  CREATININE 2.20*  < > 2.58* 2.64*  CALCIUM 8.6*  --  8.8* 8.4*  MG 2.0  --   --  2.1  PHOS 1.5*  --   --  2.9  < > = values in this interval not displayed. Liver Function Tests  Recent Labs  02/11/17 0338 02/12/17 0402  AST 70* 75*  ALT 85* 76*  ALKPHOS 97 118  BILITOT 5.9* 5.0*  PROT 5.1* 5.0*  ALBUMIN 3.0* 2.7*   No results for input(s): LIPASE, AMYLASE in the last 72 hours. Cardiac Enzymes No results for input(s): CKTOTAL, CKMB, CKMBINDEX, TROPONINI in the last 72 hours.  BNP: BNP (last 3 results) No results for input(s): BNP in the last 8760 hours.  ProBNP (last 3 results) No results for input(s): PROBNP in the last 8760 hours.   D-Dimer No results for input(s): DDIMER in the last 72 hours. Hemoglobin A1C No results for input(s): HGBA1C in the last 72 hours. Fasting Lipid Panel No results for input(s): CHOL, HDL, LDLCALC, TRIG, CHOLHDL, LDLDIRECT in the last 72 hours. Thyroid Function Tests No results for input(s): TSH, T4TOTAL, T3FREE, THYROIDAB in the last 72 hours.  Invalid input(s): FREET3  Other results:     Imaging/Studies:  Dg Chest Port 1 View  Result Date: 02/12/2017 CLINICAL DATA:  Chest pain. EXAM: PORTABLE CHEST 1 VIEW COMPARISON:  February 11, 2017 FINDINGS: Cordis has been removed. Central catheter tip is in the superior vena cava. No pneumothorax. There is a small left pleural effusion. There is patchy consolidation in the left lower lobe. There is mild atelectatic change in the right mid and lower lung zones. There is cardiomegaly with pulmonary vascularity within normal limits. No adenopathy. Patient is status post mitral valve replacement. There is atherosclerotic calcification in  the aorta. There are no evident bone lesions. IMPRESSION: No pneumothorax. Patchy consolidation left lower lobe, in part due to atelectasis but raising concern for a degree of underlying pneumonia. Small left pleural effusion. Patchy atelectasis on the right, stable. Stable cardiomegaly. Electronically Signed   By: Lowella Grip III M.D.   On: 02/12/2017 07:31   Dg Chest Port 1 View  Result Date: 02/11/2017 CLINICAL DATA:  Assess positioning of right-sided PICC line. EXAM: PORTABLE CHEST 1 VIEW COMPARISON:  Portable chest x-ray of February 11, 2017 FINDINGS: The tip of the PICC line projects over the cavoatrial junction. The lungs are mildly hypoinflated. There is no pneumothorax. The cardiac silhouette is enlarged. There is a prosthetic valve in the mitral position. There is no large pleural effusion and no pneumothorax. The right-sided Cordis sheath tip projects over the proximal SVC. IMPRESSION: The tip of the porta catheter projects over the cavoatrial junction on the right. Electronically Signed   By: David  Martinique M.D.   On: 02/11/2017 12:15      Medications:     Scheduled Medications: . sodium chloride   Intravenous Once  . aspirin  81 mg Oral Daily  . atorvastatin  80 mg Oral q1800  . cefTRIAXone (ROCEPHIN)  IV  2 g Intravenous Q24H  . chlorhexidine  15 mL Mouth Rinse BID  . Chlorhexidine Gluconate Cloth  6 each Topical Daily  . famotidine (PEPCID) IV  20 mg Intravenous Q24H  . hydrALAZINE  25 mg Oral Q8H  . insulin aspart  0-15 Units Subcutaneous Q4H  . mouth rinse  15 mL Mouth Rinse q12n4p  . metoprolol tartrate  25 mg Oral BID  . potassium chloride (KCL MULTIRUN) 30 mEq in 265 mL IVPB  30 mEq Intravenous Once  . sodium chloride flush  10-40 mL Intracatheter Q12H  . sodium chloride flush  3 mL Intravenous Q12H  . warfarin  2.5 mg Oral ONCE-1800  . Warfarin - Physician Dosing Inpatient   Does not apply q1800    Infusions: . sodium chloride    . amiodarone 30 mg/hr  (02/12/17 0815)  . dextrose 5 % and 0.45% NaCl 75 mL/hr at 02/12/17 0800  . lactated ringers Stopped (02/06/17 0100)  . lactated ringers 10 mL/hr at 02/12/17 0800  . milrinone 0.125 mcg/kg/min (02/12/17 0800)    PRN Medications: hydrALAZINE, metoprolol, ondansetron (ZOFRAN) IV, sodium chloride flush, sodium chloride flush   Assessment/Plan   1. CAD s/p CABG  x 4 on 2/15 2. Severe MR with flail segment s/p MVR c/ bioprosthetic valve. 3. Cardiogenic shock: Echo pre-op with EF 40-45%, lateral akinesis, severe MR.     --Echo 2/22 EF 40% MVR ok 4. AKI 5. Shock Liver 6. Acute respiratory failure with hypoxemia requiring intubation 7. Thrombocytopenia: Post-op. 8. Tracheal aspirate with Strep pneumoniae 9. PAF 10. Leukocytosis  Todays CO-OX is 61%.  Stop milrinone. ECHO EF 40%. Creatinine up a little 2.5>2.6 . Hold diuretics.   BP better.  Continue hydral to 25 tid only for SBP >130.   AF. Continue IV amio. Continue lopressor.    Failed swallowing study. Tracheal aspirate with Strep pneumo. Treating for tracheobronchitis with ceftriaxone. Continue incentive spirometry.  Amy Clegg,NP-C  8:36 AM  02/12/2017  Patient seen and examined with Darrick Grinder, NP. We discussed all aspects of the encounter. I agree with the assessment and plan as stated above.   She continues to have slow recovery post-op. Has failed swallow study. Creatinine slightly worse. WBC rising.   Will check UA and BCX. Expand abx to cover for aspiration.   EF 40% on echo today. Co-ox ok. Can stop milrinone. Change lasix to 40 po daily. BP ok. Unable to take po amio so will continue IV for now.   Ambulate.   Leili Eskenazi,MD 1:00 PM

## 2017-02-12 NOTE — NC FL2 (Signed)
[name] [name] SCREENING TOOL     IDENTIFICATION  Patient Name: [name] [name] Birthdate: 16-May-1935 Sex: female Admission Date (Current Location): 02/03/2017  [name] and [name] Number:  [name]:  [name], [name], [name], [name]      Provider Number: [id number]  Attending Physician Name and Address:  Gaye Pollack, MD  Relative Name and Phone Number:       Current [name]: SNF Recommended [name]: [place] Prior Approval Number:    Date Approved/Denied:   PASRR Number:    Discharge Plan: SNF    Current Diagnoses: Patient Active Problem List   Diagnosis Date Noted  . [name]   . [name] 02/05/2017  . [name]   . [name]   . [name] 02/03/2017  . [name] 02/03/2017  . Murmur 02/03/2017  . [name] 02/03/2017  . [name] 93/81/0175  . Essential hypertension 02/03/2017  . Absolute anemia 09/19/2014  . Encounter for screening colonoscopy 09/19/2014    Orientation RESPIRATION BLADDER Height & Weight     Self, Time  O2 Indwelling catheter Weight: 177 lb 4 oz (80.4 kg) Height:  5\' 6"  (167.6 cm)  BEHAVIORAL SYMPTOMS/MOOD NEUROLOGICAL BOWEL NUTRITION STATUS      Continent [name]  [name] Skin   Extensive Assist Verbally                         Personal Care Assistance Level of Assistance  Bathing, Feeding, Dressing Bathing Assistance: Limited assistance Feeding assistance: Independent Dressing Assistance: Limited assistance     Functional Limitations Info  Sight, Hearing, Speech Sight Info: Adequate Hearing Info: Adequate Speech Info: Adequate    SPECIAL CARE FACTORS FREQUENCY  [name], [name]      PT Frequency: 5x week OT Frequency: 5x week            Contractures Contractures Info: Not present    Additional Factors Info                  Current Medications (02/12/2017):  [name] Current Facility-Administered Medications  Medication Dose Route Frequency Provider Last Rate Last Dose  . 0.9 %  sodium chloride infusion  250 mL Intravenous Continuous Wayne E Gold, PA-C      . 0.9 %  sodium chloride infusion   Intravenous Once Gaye Pollack, MD      . amiodarone (NEXTERONE PREMIX) 360-4.14 MG/200ML-% (1.8 mg/mL) IV infusion  30 mg/hr Intravenous Continuous Gaye Pollack, MD 16.7 mL/hr at 02/12/17 1200 30 mg/hr at 02/12/17 1200  . aspirin chewable tablet 81 mg  81 mg Oral Daily Gaye Pollack, MD   81 mg at 02/12/17 0919  . atorvastatin (LIPITOR) tablet 80 mg  80 mg Oral q1800 Gaye Pollack, MD   80 mg at 02/11/17 1722  . chlorhexidine (PERIDEX) 0.12 % solution 15 mL  15 mL Mouth Rinse BID Gaye Pollack, MD   15 mL at 02/12/17 0919  . Chlorhexidine Gluconate Cloth 2 % PADS 6 each  6 each Topical Daily Gaye Pollack, MD   6 each at 02/12/17 1000  . dextrose 5 %-0.45 %  sodium chloride infusion   Intravenous Continuous Gaye Pollack, MD 75 mL/hr at 02/12/17 1200    . famotidine (PEPCID) IVPB 20 mg premix  20 mg Intravenous Q24H Otilio Miu, RPH   20 mg at 02/12/17 0750  . furosemide (LASIX) tablet 40 mg  40 mg Oral Daily Jolaine Artist, MD   40 mg at 02/12/17 0919  . hydrALAZINE (APRESOLINE) injection 10 mg  10 mg Intravenous Q4H PRN Jolaine Artist, MD   10 mg at 02/10/17 1553  . hydrALAZINE (APRESOLINE) tablet 25 mg  25 mg Oral Q8H Jolaine Artist, MD   25 mg at 02/11/17 1307  . insulin aspart (novoLOG) injection 0-15 Units  0-15 Units Subcutaneous Q4H Gaye Pollack, MD   2 Units at 02/12/17 1303  . lactated ringers infusion   Intravenous Continuous John Giovanni, PA-C   Stopped at 02/06/17 0100  . lactated  ringers infusion   Intravenous Continuous John Giovanni, PA-C 10 mL/hr at 02/12/17 1200    . MEDLINE mouth rinse  15 mL Mouth Rinse q12n4p Gaye Pollack, MD   15 mL at 02/12/17 1306  . metoprolol (LOPRESSOR) injection 2-5 mg  2-5 mg Intravenous Q6H PRN Grace Isaac, MD   5 mg at 02/10/17 1802  . metoprolol tartrate (LOPRESSOR) tablet 25 mg  25 mg Oral BID Jolaine Artist, MD   25 mg at 02/12/17 0919  . ondansetron (ZOFRAN) injection 4 mg  4 mg Intravenous Q6H PRN Wayne E Gold, PA-C      . piperacillin-tazobactam (ZOSYN) IVPB 3.375 g  3.375 g Intravenous Q8H Lyndee Leo, RPH   3.375 g at 02/12/17 3762  . sodium chloride flush (NS) 0.9 % injection 10-40 mL  10-40 mL Intracatheter Q12H Gaye Pollack, MD   20 mL at 02/11/17 1145  . sodium chloride flush (NS) 0.9 % injection 10-40 mL  10-40 mL Intracatheter PRN Gaye Pollack, MD      . sodium chloride flush (NS) 0.9 % injection 3 mL  3 mL Intravenous Q12H Wayne E Gold, PA-C   3 mL at 02/12/17 8315  . sodium chloride flush (NS) 0.9 % injection 3 mL  3 mL Intravenous PRN John Giovanni, PA-C      . [START ON 02/14/2017] vancomycin (VANCOCIN) IVPB 1000 mg/200 mL premix  1,000 mg Intravenous Q48H Lyndee Leo, RPH      . warfarin (COUMADIN) tablet 2.5 mg  2.5 mg Oral ONCE-1800 Gaye Pollack, MD      . Warfarin - Physician Dosing Inpatient   Does not apply [id number] Gaye Pollack, MD         Discharge Medications: Please see discharge summary for a list of discharge medications.  Relevant Imaging Results:  Relevant Lab Results:   Additional Information [id number]  Wende Neighbors, LCSW

## 2017-02-12 NOTE — Evaluation (Signed)
Physical Therapy Evaluation Patient Details Name: [name] [name] MRN: [id number] DOB: [birth year] Today's Date: 02/12/2017   History of Present Illness  82 y.o. female S/p emergent CABG x 4 and bioprosthetic MVR for LCX NSTEMI with flail MV on 2/15. PMH significant for DM, HTN, and glaucoma.   Clinical Impression  Pt presents with decreased strength, balance and activity tolerance secondary to above. PTA pt was I with mobility, but now requires assist with all mobility. Pt A&O x 4, but hesitant and inconsistent with personal history (i.e. PLOF, home living). Pt would benefit from continued acute PT to address above impairments. Pt would benefit from d/c to SNF to return to I level of function when medically ready. Acute PT will follow.   Pre ambulation: BP 135/78 Post ambulation BP 106/61 HR up to 107bpm, sats >91% on 2L O2    Follow Up Recommendations SNF;Supervision/Assistance - 24 hour    Equipment Recommendations  Rolling walker with 5" wheels;3in1 (PT)    Recommendations for Other Services       Precautions / Restrictions Precautions Precautions: Sternal;Fall Precaution Comments: reviewed sternal precautions with no evidence of learning Restrictions Weight Bearing Restrictions: Yes Other Position/Activity Restrictions: sternal precautions; no push/pull with bilateral UE      Mobility  Bed Mobility               General bed mobility comments: seated in chair upon PT arrival  Transfers Overall transfer level: Needs assistance Equipment used: Rolling walker (2 wheeled) Transfers: Sit to/from Stand Sit to Stand: Min assist         General transfer comment: v/c for hands in lap for sternal precautions, rocking technique  Ambulation/Gait Ambulation/Gait assistance: Mod assist;+2 safety/equipment Ambulation Distance (Feet): 45 Feet Assistive device: Rolling walker (2 wheeled) Gait Pattern/deviations: Step-to pattern;Step-through pattern;Decreased step length -  right;Decreased step length - left;Decreased stride length;Drifts right/left;Trunk flexed Gait velocity: decreased Gait velocity interpretation: Below normal speed for age/gender General Gait Details: progressed from step-to pattern to step-through pattern; verbal and tactile cues for steering walker due to drifting left and right  Stairs            Wheelchair Mobility    Modified Rankin (Stroke Patients Only)       Balance Overall balance assessment: Needs assistance Sitting-balance support: Feet supported;No upper extremity supported Sitting balance-Leahy Scale: Fair Sitting balance - Comments: able to sit at edge of chair without LOB with v/c for upright posture but unable to maintain balance while shifting weight to move hips forward Postural control: Posterior lean;Right lateral lean Standing balance support: Bilateral upper extremity supported Standing balance-Leahy Scale: Poor Standing balance comment: reliant upon RW                             Pertinent Vitals/Pain Pain Assessment: No/denies pain Pain Intervention(s): Limited activity within patient's tolerance;Monitored during session    Home Living Family/patient expects to be discharged to:: Unsure Living Arrangements: Spouse/significant other Available Help at Discharge: Family;Available PRN/intermittently Type of Home: Mobile home Home Access: Ramped entrance     Home Layout: One level Home Equipment: None Additional Comments: information provided via pt who seemed unsure and inconsistent with answer; pt's son in law present but wanted to defer questions to pt's daughter    Prior Function Level of Independence: Independent         Comments: pt states she was I and was driving and grocery shopping. pt states she is  also primary caregiver for husband who has dementia. pt inconsistent and hesitant with answering questions     Hand Dominance        Extremity/Trunk Assessment   Upper  Extremity Assessment Upper Extremity Assessment: Generalized weakness    Lower Extremity Assessment Lower Extremity Assessment: Generalized weakness    Cervical / Trunk Assessment Cervical / Trunk Assessment: Kyphotic  Communication   Communication: No difficulties  Cognition Arousal/Alertness: Awake/alert Behavior During Therapy: Anxious Overall Cognitive Status: Impaired/Different from baseline Area of Impairment: Memory;Safety/judgement;Problem solving     Memory: Decreased recall of precautions;Decreased short-term memory   Safety/Judgement: Decreased awareness of safety   Problem Solving: Slow processing;Difficulty sequencing;Requires verbal cues General Comments: A&O x 4 but hesitant and inconsistent with answers regarding PLOF and home living    General Comments      Exercises     Assessment/Plan    PT Assessment Patient needs continued PT services  PT Problem List Decreased strength;Decreased activity tolerance;Decreased balance;Decreased mobility;Decreased cognition;Decreased knowledge of use of DME;Decreased safety awareness;Decreased knowledge of precautions       PT Treatment Interventions DME instruction;Gait training;Therapeutic activities;Therapeutic exercise;Balance training;Patient/family education    PT Goals (Current goals can be found in the Care Plan section)  Acute Rehab PT Goals Patient Stated Goal: none stated PT Goal Formulation: Patient unable to participate in goal setting    Frequency Min 3X/week   Barriers to discharge Decreased caregiver support primary caretaker at home for husband with dementia    Co-evaluation               End of Session Equipment Utilized During Treatment: Gait belt;Oxygen Activity Tolerance: Patient tolerated treatment well Patient left: in chair;with call bell/phone within reach;with nursing/sitter in room;with family/visitor present Nurse Communication: Mobility status PT Visit Diagnosis:  Unsteadiness on feet (R26.81);Other abnormalities of gait and mobility (R26.89);Muscle weakness (generalized) (M62.81)         Time: 6568-1275 PT Time Calculation (min) (ACUTE ONLY): 23 min   Charges:   PT Evaluation $PT Eval Moderate Complexity: 1 Procedure PT Treatments $Gait Training: 8-22 mins   PT G CodesTracie Harrier 02/20/17, 9:51 AM  Tracie Harrier, SPT Acute Rehab SPT [phone]

## 2017-02-12 NOTE — Clinical Social Work Note (Signed)
Clinical Social Work Assessment  Patient Details  Name: JAEDEN WESTBAY MRN: [id number] Date of Birth: [birth year]  Date of referral:  02/12/17               Reason for consult:  Discharge Planning                Permission sought to share information with:  Family Supports Permission granted to share information::  Yes, Verbal Permission Granted  Name::     Benay Pike  Agency::     Relationship::  [phone]  Contact Information:  sister  Housing/Transportation Living arrangements for the past 2 months:  Single Family Home Source of Information:  Patient Patient Interpreter Needed:  None Criminal Activity/Legal Involvement Pertinent to Current Situation/Hospitalization:  No - Comment as needed Significant Relationships:  None Lives with:  Self, Spouse Do you feel safe going back to the place where you live?  Yes Need for family participation in patient care:  No (Coment)  Care giving concerns: Patient stated she has family support   Facilities manager / plan:  CSW met patient at bedside to offer support. Patient stated she lives at home with husband but stated husband has early forms of dementia. Patient agreeable to discharge to SNF to received rehab. Patient stated she has never been to a SNF but spouse has and would like to attend [facility]. CSW to complete necessary paperwork and initiate SNF search on patients behalf.  CSW to follow up with patients once bed offers are available. CSW remains available for support and to facilitate discharge.    Employment status:  Unemployed Forensic scientist:  Medicare PT Recommendations:  [place] / Referral to community resources:  [place]  Patient/Family's Response to care:  Patient verbalized appreciation and understanding for CSW role and involvement in care. Patient agreeable with current discharge plan to SNF  Patient/Family's Understanding of and Emotional Response to  Diagnosis, Current Treatment, and Prognosis:  Patient with good understanding of current medical state and limitations around most recent hospitalization. Patient is agreeable with SNF placement in hopes of transitioning back home to care for spouse.   Emotional Assessment Appearance:  Appears stated age Attitude/Demeanor/Rapport:  Other Affect (typically observed):  Pleasant Orientation:  Oriented to Place, Oriented to Self Alcohol / Substance use:  Not Applicable Psych involvement (Current and /or in the community):  No (Comment)  Discharge Needs  Concerns to be addressed:    Readmission within the last 30 days:  No Current discharge risk:  None Barriers to Discharge:  No Barriers Identified   Wende Neighbors, LCSW 02/12/2017, 3:30 PM

## 2017-02-13 ENCOUNTER — Encounter
Admission: RE | Admit: 2017-02-13 | Discharge: 2017-02-13 | Disposition: A | Discharge status: Home / Self Care | Payer: Medicare Other | Source: Ambulatory Visit | Attending: Internal Medicine | Admitting: Internal Medicine

## 2017-02-13 ENCOUNTER — Inpatient Hospital Stay (HOSPITAL_COMMUNITY): Payer: Medicare Other

## 2017-02-13 LAB — COOXEMETRY PANEL
CARBOXYHEMOGLOBIN: 1.3 % (ref 0.5–1.5)
METHEMOGLOBIN: 0.8 % (ref 0.0–1.5)
O2 Saturation: 63.6 %
TOTAL HEMOGLOBIN: 9.7 g/dL — AB (ref 12.0–16.0)

## 2017-02-13 LAB — COMPREHENSIVE METABOLIC PANEL
ALT: 67 U/L — AB (ref 14–54)
AST: 77 U/L — ABNORMAL HIGH (ref 15–41)
Albumin: 2.6 g/dL — ABNORMAL LOW (ref 3.5–5.0)
Alkaline Phosphatase: 144 U/L — ABNORMAL HIGH (ref 38–126)
Anion gap: 12 (ref 5–15)
BILIRUBIN TOTAL: 3.4 mg/dL — AB (ref 0.3–1.2)
BUN: 64 mg/dL — ABNORMAL HIGH (ref 6–20)
CALCIUM: 7.9 mg/dL — AB (ref 8.9–10.3)
CO2: 24 mmol/L (ref 22–32)
Chloride: 108 mmol/L (ref 101–111)
Creatinine, Ser: 2.84 mg/dL — ABNORMAL HIGH (ref 0.44–1.00)
GFR, EST AFRICAN AMERICAN: 17 mL/min — AB (ref >60)
GFR, EST NON AFRICAN AMERICAN: 15 mL/min — AB (ref >60)
Glucose, Bld: 113 mg/dL — ABNORMAL HIGH (ref 65–99)
Potassium: 3.6 mmol/L (ref 3.5–5.1)
Sodium: 144 mmol/L (ref 135–145)
TOTAL PROTEIN: 5.6 g/dL — AB (ref 6.5–8.1)

## 2017-02-13 LAB — CBC
HCT: 28.7 % — ABNORMAL LOW (ref 36.0–46.0)
Hemoglobin: 9.4 g/dL — ABNORMAL LOW (ref 12.0–15.0)
MCH: 25.1 pg — ABNORMAL LOW (ref 26.0–34.0)
MCHC: 32.8 g/dL (ref 30.0–36.0)
MCV: 76.5 fL — AB (ref 78.0–100.0)
PLATELETS: 267 10*3/uL (ref 150–400)
RBC: 3.75 MIL/uL — AB (ref 3.87–5.11)
RDW: 22.8 % — ABNORMAL HIGH (ref 11.5–15.5)
WBC: 20.7 10*3/uL — AB (ref 4.0–10.5)

## 2017-02-13 LAB — GLUCOSE, CAPILLARY
Glucose-Capillary: 121 mg/dL — ABNORMAL HIGH (ref 65–99)
Glucose-Capillary: 122 mg/dL — ABNORMAL HIGH (ref 65–99)
Glucose-Capillary: 128 mg/dL — ABNORMAL HIGH (ref 65–99)
Glucose-Capillary: 152 mg/dL — ABNORMAL HIGH (ref 65–99)
Glucose-Capillary: 86 mg/dL (ref 65–99)
Glucose-Capillary: 87 mg/dL (ref 65–99)

## 2017-02-13 LAB — PHOSPHORUS: PHOSPHORUS: 3.7 mg/dL (ref 2.5–4.6)

## 2017-02-13 LAB — PROTIME-INR
INR: 2
Prothrombin Time: 23 seconds — ABNORMAL HIGH (ref 11.4–15.2)

## 2017-02-13 LAB — URINE CULTURE
Culture: NO GROWTH
SPECIAL REQUESTS: NORMAL

## 2017-02-13 LAB — MAGNESIUM: MAGNESIUM: 2 mg/dL (ref 1.7–2.4)

## 2017-02-13 MED ORDER — WARFARIN SODIUM 1 MG PO TABS
1.0000 mg | ORAL_TABLET | Freq: Once | ORAL | Status: AC
  Administered 2017-02-13: 1 mg via ORAL
  Filled 2017-02-13: qty 1

## 2017-02-13 MED ORDER — PIPERACILLIN-TAZOBACTAM 3.375 G IVPB
3.3750 g | Freq: Two times a day (BID) | INTRAVENOUS | Status: DC
  Administered 2017-02-13 – 2017-02-14 (×3): 3.375 g via INTRAVENOUS
  Filled 2017-02-13 (×3): qty 50

## 2017-02-13 MED ORDER — AMIODARONE HCL 200 MG PO TABS
200.0000 mg | ORAL_TABLET | Freq: Two times a day (BID) | ORAL | Status: DC
  Administered 2017-02-13 – 2017-02-15 (×5): 200 mg via ORAL
  Filled 2017-02-13 (×6): qty 1

## 2017-02-13 MED ORDER — PANTOPRAZOLE SODIUM 40 MG PO TBEC
40.0000 mg | DELAYED_RELEASE_TABLET | Freq: Every day | ORAL | Status: DC
  Administered 2017-02-13: 40 mg via ORAL
  Filled 2017-02-13: qty 1

## 2017-02-13 MED ORDER — RESOURCE THICKENUP CLEAR PO POWD
ORAL | Status: DC | PRN
  Filled 2017-02-13 (×4): qty 125

## 2017-02-13 NOTE — Clinical Social Work Note (Signed)
PASRR# [id number], processed and received on 02/13/2017.

## 2017-02-13 NOTE — Progress Notes (Signed)
Patient ID: [name] [name], female   DOB: [birth year], 82 y.o.   MRN: [id number] EVENING ROUNDS NOTE :     [address]       ,[place] [address]             [phone]                 8 Days Post-Op Procedure(s) (LRB): CORONARY ARTERY BYPASS GRAFTING (CABG) x 4                                                                               LIMA-LAD SEQ SVG-DIAG-OM SVG-PD (N/A) TRANSESOPHAGEAL ECHOCARDIOGRAM (TEE) (N/A) MITRAL VALVE REPLACEMENT (MVR) WITH MAGNA MITRAL EASE PERICARDIAL BIOPROSTHESIS 25 MM (N/A)  Total Length of Stay:  LOS: 10 days  BP 125/68 (BP Location: Left Arm)   Pulse 75   Temp 97.6 F (36.4 C) (Axillary)   Resp 17   Ht 5\' 6"  (1.676 m)   Wt 177 lb 0.5 oz (80.3 kg)   SpO2 100%   BMI 28.57 kg/m   .Intake/Output      02/22 0701 - 02/23 0700 02/23 0701 - 02/24 0700   I.V. (mL/kg) 1881.8 (23.4) 540 (6.7)   IV Piggyback 415 50   Total Intake(mL/kg) 2296.8 (28.6) 590 (7.3)   Urine (mL/kg/hr) 830 (0.4) 160 (0.2)   Total Output 830 160   Net +1466.8 +430          . sodium chloride    . dextrose 5 % and 0.45% NaCl 50 mL/hr at 02/13/17 1600  . lactated ringers Stopped (02/06/17 0100)  . lactated ringers 10 mL/hr at 02/13/17 1600     Lab Results  Component Value Date   WBC 20.7 (H) 02/13/2017   HGB 9.4 (L) 02/13/2017   HCT 28.7 (L) 02/13/2017   PLT 267 02/13/2017   GLUCOSE 113 (H) 02/13/2017   CHOL 156 02/04/2017   TRIG 104 02/04/2017   HDL 42 02/04/2017   LDLCALC 93 02/04/2017   ALT 67 (H) 02/13/2017   AST 77 (H) 02/13/2017   NA 144 02/13/2017   K 3.6 02/13/2017   CL 108 02/13/2017   CREATININE 2.84 (H) 02/13/2017   BUN 64 (H) 02/13/2017   CO2 24 02/13/2017   TSH 0.576 02/03/2017   INR 2.00 02/13/2017   HGBA1C 6.3 (H) 02/03/2017   Still very weak, not taking much po, swallowing evaluation today Chronic Kidney Disease   Stage I     GFR >90  Stage II    GFR 60-89  Stage IIIA GFR 45-59  Stage IIIB GFR 30-44  Stage IV   GFR  15-29  Stage V    GFR  <15  Lab Results  Component Value Date   CREATININE 2.84 (H) 02/13/2017   Estimated Creatinine Clearance: 17.2 mL/min (by C-G formula based on SCr of 2.84 mg/dL (H)).  Grace Isaac MD  Beeper [phone] Office [phone] 02/13/2017 5:51 PM

## 2017-02-13 NOTE — Progress Notes (Signed)
  Speech Language Pathology Treatment: Dysphagia  Patient Details Name: [name] [name] MRN: [id number] DOB: 11/12/1935 Today's Date: 02/13/2017 Time: 5462-7035 SLP Time Calculation (min) (ACUTE ONLY): 16 min  Assessment / Plan / Recommendation Clinical Impression  ST follow up for PO readiness and need for MBS.  Oral mechanism exam was completed and the patient demonstrated mild lingual deviation to the left with protrusion and mild left side labial droop seen during retraction.  Otherwise exam was unremarkable.  The patient was trialed on ice chips,  thin liquids and dysphagia 1 pureed material.  Immediate throat clear noted following ice chips and thin liquids.  Quicker oral transit noted today given pureed material with delayed throat clear.  Recommend that the patient remain NPO except meds crushed in pureed material pending results of MBS later today.   HPI HPI: [name] [name] is an 82 year old woman with history of type 2 diabetes mellitus and hypertension, who presented to cardiology clinic due to acute chest pain that began 3 days ago while doing light housework. EKG showed abnormalities concerning for ischemia. Urgent cardiac cath revealed three-vessel CAD, including serial 70 and 80% proximal/mid LAD stenoses. Underwent CABG x 4 2/15 and extubated 2/19. CXR No pneumothorax or pulmonary edema. Left lung base atelectasis and possible small pleural effusion.      SLP Plan  New goals to be determined pending instrumental study       Recommendations  Diet recommendations: NPO Medication Administration: Crushed with puree                Oral Care Recommendations: Oral care QID Follow up Recommendations: Other (comment) (TBD) SLP Visit Diagnosis: Dysphagia, oropharyngeal phase (R13.12) Plan: New goals to be determined pending instrumental study       [place], MA, [place] Acute Rehab SLP [phone]  [name] 02/13/2017, 10:22 AM

## 2017-02-13 NOTE — Progress Notes (Signed)
Modified Barium Swallow Progress Note  Patient Details  Name: MAKYLA BYE MRN: [id number] Date of Birth: 1935-11-21  Today's Date: 02/13/2017  Modified Barium Swallow completed.  Full report located under Chart Review in the Imaging Section.  Brief recommendations include the following:  Clinical Impression  Pt demonstrates moderate oral dysphagia, though reassessment of lingual strength did not show any lateral weakness or deviation (pt observed to stick tongue straight, but also sometimes curved it around a single tooth). Pt did orally hold all boluses for several seconds, requiring verbal cues to initiate swallow. Mild base of tongue residual also noted post swallow, though this appeared related to dry mucosa and thick liquids rather than weakness. If not cleared, these oral residuals mixed with secretions and penetrated the vestibule. Min verbal cue for a dry swallow cleared this well. Primary problem is delayed swallow initiation to the pyriform sinuses with thin and nectar thick textures resulting in sensed and silent aspiration as swallow was initiated. Phayngeal strength WNL. This was prevented with honey thick liquids. Recommend pt initiate a Dys 1 (puree) diet and honey thick liquids with full supervision and cues for a second swallow. Reviewed with RN. Will follow for tolerance and advancement of solids with dentures in place and improved endurance.   Swallow Evaluation Recommendations       SLP Diet Recommendations: Honey thick liquids;Dysphagia 1 (Puree) solids   Liquid Administration via: Cup;Spoon   Medication Administration: Whole meds with puree   Supervision: Staff to assist with self feeding;Full supervision/cueing for compensatory strategies   Compensations: Slow rate;Small sips/bites;Multiple dry swallows after each bite/sip   Postural Changes: Remain semi-upright after after feeds/meals (Comment);Seated upright at 90 degrees   Oral Care Recommendations: Oral  care BID        Jakeb Lamping, Katherene Ponto 02/13/2017,12:27 PM

## 2017-02-13 NOTE — Progress Notes (Addendum)
Patient ID: [name] [name], female   DOB: [birth year], 82 y.o.   MRN: [id number]     Advanced Heart Failure Rounding Note  PCP:  Primary Cardiologist:   Subjective:    S/p emergent CABG x 4 and bioprosthetic MVR for LCX NSTEMI with flail MV on 2/15  Yesterday milrinone stopped and diuretics held. Creatinine trending up.  WBC remains elevated 21>20. UA+. ? Aspiration on CXR, Antibiotics expanded. Now on vanc and zosyn. Bld CX pending.  Failed swallow test (again).   Denies SOB.                                                                         Creatinine 2.6>2.8   Objective:   Weight Range: 177 lb 0.5 oz (80.3 kg) Body mass index is 28.57 kg/m.   Vital Signs:   Temp:  [97.1 F (36.2 C)-100 F (37.8 C)] 98.3 F (36.8 C) (02/23 0757) Pulse Rate:  [77-112] 82 (02/23 0900) Resp:  [18-38] 18 (02/23 0900) BP: (94-150)/(61-82) 121/71 (02/23 0900) SpO2:  [96 %-100 %] 100 % (02/23 0900) Weight:  [177 lb 0.5 oz (80.3 kg)] 177 lb 0.5 oz (80.3 kg) (02/23 0500) Last BM Date: 02/02/17  Weight change: Filed Weights   02/11/17 0500 02/12/17 0500 02/13/17 0500  Weight: 174 lb 13.2 oz (79.3 kg) 177 lb 4 oz (80.4 kg) 177 lb 0.5 oz (80.3 kg)    Intake/Output:   Intake/Output Summary (Last 24 hours) at 02/13/17 1001 Last data filed at 02/13/17 0900  Gross per 24 hour  Intake          1793.73 ml  Output              950 ml  Net           843.73 ml     Physical Exam: General:  Sitting in chair. NAD. weak HEENT: normal Neck: supple.  Carotids: JVP to jaw. 2+ bilat; no bruits. No lymphadenopathy or thyromegaly appreciated. Cor: PMI nondisplaced. irregular. No M/G/R noted.   Lungs: Decreased in the bases.  Abdomen: soft, ND, no HSM. No bruits or masses. +BS  Extremities: no cyanosis, clubbing, rash. RUE PICC. RLE LLE 2+ edema. .  Neuro: A&Ox3 follows commands and is oriented. Moves all 4  Telemetry: Sinus Rhythm 80s but  in and out A fib. Personally  reviewed  Labs: CBC  Recent Labs  02/12/17 0402 02/13/17 0352  WBC 21.2* 20.7*  HGB 8.8* 9.4*  HCT 26.8* 28.7*  MCV 75.5* 76.5*  PLT 232 867   Basic Metabolic Panel  Recent Labs  02/12/17 0402 02/13/17 0352  NA 144 144  K 3.4* 3.6  CL 106 108  CO2 26 24  GLUCOSE 234* 113*  BUN 59* 64*  CREATININE 2.64* 2.84*  CALCIUM 8.4* 7.9*  MG 2.1 2.0  PHOS 2.9 3.7   Liver Function Tests  Recent Labs  02/12/17 0402 02/13/17 0352  AST 75* 77*  ALT 76* 67*  ALKPHOS 118 144*  BILITOT 5.0* 3.4*  PROT 5.0* 5.6*  ALBUMIN 2.7* 2.6*   No results for input(s): LIPASE, AMYLASE in the last 72 hours. Cardiac Enzymes No results for input(s): CKTOTAL, CKMB, CKMBINDEX, TROPONINI in the last 72 hours.  BNP:  BNP (last 3 results) No results for input(s): BNP in the last 8760 hours.  ProBNP (last 3 results) No results for input(s): PROBNP in the last 8760 hours.   D-Dimer No results for input(s): DDIMER in the last 72 hours. Hemoglobin A1C No results for input(s): HGBA1C in the last 72 hours. Fasting Lipid Panel No results for input(s): CHOL, HDL, LDLCALC, TRIG, CHOLHDL, LDLDIRECT in the last 72 hours. Thyroid Function Tests No results for input(s): TSH, T4TOTAL, T3FREE, THYROIDAB in the last 72 hours.  Invalid input(s): FREET3  Other results:     Imaging/Studies:  No results found.    Medications:     Scheduled Medications: . sodium chloride   Intravenous Once  . amiodarone  200 mg Oral BID  . aspirin  81 mg Oral Daily  . atorvastatin  80 mg Oral q1800  . chlorhexidine  15 mL Mouth Rinse BID  . Chlorhexidine Gluconate Cloth  6 each Topical Daily  . furosemide  40 mg Oral Daily  . hydrALAZINE  25 mg Oral Q8H  . insulin aspart  0-15 Units Subcutaneous Q4H  . mouth rinse  15 mL Mouth Rinse q12n4p  . metoprolol tartrate  25 mg Oral BID  . pantoprazole  40 mg Oral Daily  . piperacillin-tazobactam (ZOSYN)  IV  3.375 g Intravenous Q8H  . sodium chloride flush   10-40 mL Intracatheter Q12H  . sodium chloride flush  3 mL Intravenous Q12H  . [START ON 02/14/2017] vancomycin  1,000 mg Intravenous Q48H  . warfarin  1 mg Oral ONCE-1800  . Warfarin - Physician Dosing Inpatient   Does not apply q1800    Infusions: . sodium chloride    . dextrose 5 % and 0.45% NaCl 50 mL/hr at 02/13/17 0900  . lactated ringers Stopped (02/06/17 0100)  . lactated ringers 10 mL/hr at 02/13/17 0900    PRN Medications: hydrALAZINE, metoprolol, ondansetron (ZOFRAN) IV, sodium chloride flush, sodium chloride flush   Assessment/Plan   1. CAD [name] on 2/15 2. Severe MR with flail segment s/p MVR c/ bioprosthetic valve. 3. Cardiogenic shock: Echo pre-op with EF 40-45%, lateral akinesis, severe MR.     --Echo 2/22 EF 40% MVR ok 4. AKI 5. Shock Liver 6. Acute respiratory failure with hypoxemia requiring intubation 7. Thrombocytopenia: Post-op. 8. Tracheal aspirate with Strep pneumoniae 9. PAF 10. Leukocytosis  Todays CO-OX is 64% off milrinone. ECHO EF 40%. Creatinine up a little 2.5>2.6>2.8. Hold diuretics.   BP controlled. Continue hydral to 25 tid only for SBP >130.   AF. Continue IV amio. Continue lopressor.   Antibiotics expanded. WBC remains elevated. 21>20.7. Blood cultures pending. UA +  Failed swallowing study. Tracheal aspirate with Strep pneumo. Was on ceftriaxone but expanded to vanc and zosyn. A Continue incentive spirometry.  Amy Clegg,NP-C  10:01 AM  02/13/2017   Patient seen and examined with Darrick Grinder, NP. We discussed all aspects of the encounter. I agree with the assessment and plan as stated above.   Making slow progress. Milrinone now off. Co-ox ok. Creatinine rising slowly so lasix held although weight is still up significantly from pre-surgery weight and has significant volume overload on exam. Will hold diuretics for now. Check CVPs. I think she may have a component of ATN given previous fluctuations in BP. If creatinine down  tomorrow will start po demadex.   UA + with high WBC. Also possible aspiration. Now improving on vanc/zosyn. Will cotninue. Await culture data.   Continues with PAF  on amio and lopressor. Warfarin started.   Tyriek Hofman,MD 1:00 PM

## 2017-02-13 NOTE — Progress Notes (Addendum)
Nutrition Follow Up  DOCUMENTATION CODES:   Not applicable  INTERVENTION:    Mighty Shake II TID with meals, each supplement provides 480-500 kcals and 20-23 grams of protein  NEW NUTRITION DIAGNOSIS:   Predicted suboptimal nutrient intake related to dysphagia as evidenced by  (Modified Barium Swallow Study), ongoing  GOAL:   Patient will meet greater than or equal to 90% of their needs, progressing   MONITOR:   PO intake, Supplement acceptance, Labs, Weight trends, I & O's  ASSESSMENT:   82 yo Feamle with HTN and DM found to have significant three-vessel CAD in the setting of new chest pain with minimal exertion over the last 3 days consistent with unstable angina.   Pt s/p procedures 2/15: CABG, MITRAL VALVE REPAIR  Pt extubated 2/19. Vital AF 1.2 formula discontinued via OGT. S/p MBSS today >> SLP rec Dys 1-honey thick liquids. Would benefit for addition of oral nutrition supplements. Labs and medications reviewed. CBG's 86-121-122.  Diet Order:  DIET - DYS 1 Room service appropriate? Yes; Fluid consistency: Honey Thick  Skin:  Reviewed, no issues  Last BM:  2/22  Height:   Ht Readings from Last 1 Encounters:  02/05/17 5\' 6"  (1.676 m)    Weight:   Wt Readings from Last 1 Encounters:  02/13/17 177 lb 0.5 oz (80.3 kg)    Ideal Body Weight:  59 kg  BMI:  Body mass index is 28.57 kg/m.  Estimated Nutritional Needs:   Kcal:  1800-2000  Protein:  95-105 gm  Fluid:  1.8-2.0 L  EDUCATION NEEDS:   No education needs identified at this time  Arthur Holms, RD, LDN Pager #: [phone] After-Hours Pager #: [phone]

## 2017-02-13 NOTE — Progress Notes (Signed)
8 Days Post-Op Procedure(s) (LRB): CORONARY ARTERY BYPASS GRAFTING (CABG) x 4                                                                               LIMA-LAD SEQ SVG-DIAG-OM SVG-PD (N/A) TRANSESOPHAGEAL ECHOCARDIOGRAM (TEE) (N/A) MITRAL VALVE REPLACEMENT (MVR) WITH MAGNA MITRAL EASE PERICARDIAL BIOPROSTHESIS 25 MM (N/A) Subjective:  No complaints. Has been more alert. Did not sleep much overnight according to nurse. Up and down from bed to chair  Objective: Vital signs in last 24 hours: Temp:  [97.1 F (36.2 C)-100 F (37.8 C)] 98 F (36.7 C) (02/23 0400) Pulse Rate:  [77-119] 80 (02/23 0700) Cardiac Rhythm: Normal sinus rhythm (02/23 0700) Resp:  [18-38] 20 (02/23 0700) BP: (110-150)/(61-82) 125/61 (02/23 0700) SpO2:  [93 %-100 %] 99 % (02/23 0700) Weight:  [80.3 kg (177 lb 0.5 oz)] 80.3 kg (177 lb 0.5 oz) (02/23 0500)  Hemodynamic parameters for last 24 hours:    Intake/Output from previous day: 02/22 0701 - 02/23 0700 In: 2296.8 [I.V.:1881.8; IV Piggyback:415] Out: 830 [Urine:830] Intake/Output this shift: No intake/output data recorded.  General appearance: alert, cooperative and but still a little squirrelly. Neurologic: intact Heart: regular rate and rhythm, S1, S2 normal, no murmur, click, rub or gallop Lungs: clear to auscultation bilaterally Abdomen: soft, non-tender; bowel sounds normal; no masses,  no organomegaly Extremities: edema mild Wound: incisions ok  Lab Results:  Recent Labs  02/12/17 0402 02/13/17 0352  WBC 21.2* 20.7*  HGB 8.8* 9.4*  HCT 26.8* 28.7*  PLT 232 267   BMET:  Recent Labs  02/12/17 0402 02/13/17 0352  NA 144 144  K 3.4* 3.6  CL 106 108  CO2 26 24  GLUCOSE 234* 113*  BUN 59* 64*  CREATININE 2.64* 2.84*  CALCIUM 8.4* 7.9*    PT/INR:  Recent Labs  02/13/17 0352  LABPROT 23.0*  INR 2.00   ABG    Component Value Date/Time   PHART 7.377 02/09/2017 1603   HCO3 23.0 02/09/2017 1603   TCO2 26 02/11/2017 1618   ACIDBASEDEF 2.0 02/09/2017 1603   O2SAT 63.6 02/13/2017 0410   CBG (last 3)   Recent Labs  02/12/17 2012 02/12/17 2346 02/13/17 0406  GLUCAP 116* 122* 86    Assessment/Plan: S/P Procedure(s) (LRB): CORONARY ARTERY BYPASS GRAFTING (CABG) x 4                                                                               LIMA-LAD SEQ SVG-DIAG-OM SVG-PD (N/A) TRANSESOPHAGEAL ECHOCARDIOGRAM (TEE) (N/A) MITRAL VALVE REPLACEMENT (MVR) WITH MAGNA MITRAL EASE PERICARDIAL BIOPROSTHESIS 25 MM (N/A)  She remains hemodynamically stable in sinus rhythm on amio for postop atrial fib. Co-ox is 64% off milrinone.  Acute renal failure: creat is up slightly from yesterday but urine output is adequate. It is not completely clear why creat was improving down to 2.2 and then suddenly  started to rise again but I suspect it was due to diuresis and getting too dry. She was never hypotensive and not on nephrotoxic agents although now she is on vanc so will have to avoid high levels. Removing foley to prevent UTI if she does not already have one based on UA. Culture pending.  Leukocytosis stable. Blood and urine cultures pending. On Vanc and Zosyn since yesterday.  More alert and taking pills with applesauce and ice chips with no problem so hopefully speech therapy can reevaluate.  Continue mobilization  DM: glucose under good control  INR 2.0: will give 1 mg coumadin today. She is on amio and had shock liver so need to be careful.   LOS: 10 days    Gaye Pollack 02/13/2017

## 2017-02-14 ENCOUNTER — Inpatient Hospital Stay (HOSPITAL_COMMUNITY): Payer: Medicare Other

## 2017-02-14 LAB — CBC
HCT: 29 % — ABNORMAL LOW (ref 36.0–46.0)
Hemoglobin: 9.2 g/dL — ABNORMAL LOW (ref 12.0–15.0)
MCH: 24.6 pg — ABNORMAL LOW (ref 26.0–34.0)
MCHC: 31.7 g/dL (ref 30.0–36.0)
MCV: 77.5 fL — ABNORMAL LOW (ref 78.0–100.0)
PLATELETS: 320 10*3/uL (ref 150–400)
RBC: 3.74 MIL/uL — ABNORMAL LOW (ref 3.87–5.11)
RDW: 23.1 % — AB (ref 11.5–15.5)
WBC: 20.9 10*3/uL — AB (ref 4.0–10.5)

## 2017-02-14 LAB — BASIC METABOLIC PANEL
ANION GAP: 11 (ref 5–15)
BUN: 71 mg/dL — ABNORMAL HIGH (ref 6–20)
CALCIUM: 8.1 mg/dL — AB (ref 8.9–10.3)
CO2: 25 mmol/L (ref 22–32)
Chloride: 108 mmol/L (ref 101–111)
Creatinine, Ser: 3.19 mg/dL — ABNORMAL HIGH (ref 0.44–1.00)
GFR, EST AFRICAN AMERICAN: 15 mL/min — AB (ref >60)
GFR, EST NON AFRICAN AMERICAN: 13 mL/min — AB (ref >60)
Glucose, Bld: 243 mg/dL — ABNORMAL HIGH (ref 65–99)
Potassium: 3.4 mmol/L — ABNORMAL LOW (ref 3.5–5.1)
SODIUM: 144 mmol/L (ref 135–145)

## 2017-02-14 LAB — COOXEMETRY PANEL
CARBOXYHEMOGLOBIN: 0.9 % (ref 0.5–1.5)
METHEMOGLOBIN: 1.1 % (ref 0.0–1.5)
O2 Saturation: 57.9 %
Total hemoglobin: 9.1 g/dL — ABNORMAL LOW (ref 12.0–16.0)

## 2017-02-14 LAB — GLUCOSE, CAPILLARY
GLUCOSE-CAPILLARY: 132 mg/dL — AB (ref 65–99)
GLUCOSE-CAPILLARY: 141 mg/dL — AB (ref 65–99)
Glucose-Capillary: 105 mg/dL — ABNORMAL HIGH (ref 65–99)
Glucose-Capillary: 114 mg/dL — ABNORMAL HIGH (ref 65–99)
Glucose-Capillary: 153 mg/dL — ABNORMAL HIGH (ref 65–99)
Glucose-Capillary: 76 mg/dL (ref 65–99)

## 2017-02-14 LAB — PROTIME-INR
INR: 3.08
Prothrombin Time: 32.4 seconds — ABNORMAL HIGH (ref 11.4–15.2)

## 2017-02-14 MED ORDER — DOPAMINE-DEXTROSE 3.2-5 MG/ML-% IV SOLN
0.0000 ug/kg/min | INTRAVENOUS | Status: DC
  Administered 2017-02-14 – 2017-02-16 (×2): 2.5 ug/kg/min via INTRAVENOUS
  Filled 2017-02-14 (×2): qty 250

## 2017-02-14 MED ORDER — FUROSEMIDE 10 MG/ML IJ SOLN
120.0000 mg | Freq: Two times a day (BID) | INTRAVENOUS | Status: DC
  Administered 2017-02-14 (×2): 120 mg via INTRAVENOUS
  Filled 2017-02-14 (×3): qty 12

## 2017-02-14 MED ORDER — PIPERACILLIN-TAZOBACTAM IN DEX 2-0.25 GM/50ML IV SOLN
2.2500 g | Freq: Three times a day (TID) | INTRAVENOUS | Status: DC
  Administered 2017-02-14 – 2017-02-15 (×2): 2.25 g via INTRAVENOUS
  Filled 2017-02-14 (×3): qty 50

## 2017-02-14 NOTE — Progress Notes (Signed)
Patient ID: [name], female   DOB: August 28, 1935, 82 y.o.   MRN: [id number]     Advanced Heart Failure Rounding Note  PCP:  Primary Cardiologist:   Subjective:    S/p emergent CABG x 4 and bioprosthetic MVR for LCX NSTEMI with flail MV on 2/15  Remains weak. But says she feels better. Denies SOB. Po intake poor. Now on dypshagia I diet.   Off milrinone. Co-ox 58%. Creatinine getting worse 2.8 -> 3.2. Weight continues to climb. CVP with respiratory variation but ranging 12-17  WBC remains elevated at 20K . UA+. BCX and UCX negative. ? Aspiration on CXR. Now on vanc and zosyn.   Echo with EF 40% and stable MVR  In NSR on IV amio  Objective:   Weight Range: 82.1 kg (181 lb) Body mass index is 29.21 kg/m.   Vital Signs:   Temp:  [97.3 F (36.3 C)-99 F (37.2 C)] 97.6 F (36.4 C) (02/24 0753) Pulse Rate:  [68-86] 75 (02/24 0800) Resp:  [15-27] 15 (02/24 0800) BP: (101-129)/(55-82) 108/71 (02/24 0800) SpO2:  [94 %-100 %] 100 % (02/24 0800) Weight:  [82.1 kg (181 lb)] 82.1 kg (181 lb) (02/24 0500) Last BM Date: 02/02/17  Weight change: Filed Weights   02/12/17 0500 02/13/17 0500 02/14/17 0500  Weight: 80.4 kg (177 lb 4 oz) 80.3 kg (177 lb 0.5 oz) 82.1 kg (181 lb)    Intake/Output:   Intake/Output Summary (Last 24 hours) at 02/14/17 0817 Last data filed at 02/14/17 0800  Gross per 24 hour  Intake             1640 ml  Output              610 ml  Net             1030 ml     Physical Exam: General:  Sitting in chair. NAD. weak HEENT: normal Neck: supple.  Carotids: JVP to jaw. 2+ bilat; no bruits. No lymphadenopathy or thyromegaly appreciated. Cor: PMI nondisplaced. irregular. No M/G/R noted.   Lungs: Decreased in the bases.  Abdomen: soft, ND, no HSM. No bruits or masses. +BS  Extremities: no cyanosis, clubbing, rash. RUE PICC. RLE LLE 2+ edema. .  Neuro: A&Ox3 follows commands and is oriented. Moves all 4  Telemetry: Sinus Rhythm 70s frequent PACs  Personally reviewed  Labs: CBC  Recent Labs  02/13/17 0352 02/14/17 0413  WBC 20.7* 20.9*  HGB 9.4* 9.2*  HCT 28.7* 29.0*  MCV 76.5* 77.5*  PLT 267 062   Basic Metabolic Panel  Recent Labs  02/12/17 0402 02/13/17 0352 02/14/17 0413  NA 144 144 144  K 3.4* 3.6 3.4*  CL 106 108 108  CO2 26 24 25   GLUCOSE 234* 113* 243*  BUN 59* 64* 71*  CREATININE 2.64* 2.84* 3.19*  CALCIUM 8.4* 7.9* 8.1*  MG 2.1 2.0  --   PHOS 2.9 3.7  --    Liver Function Tests  Recent Labs  02/12/17 0402 02/13/17 0352  AST 75* 77*  ALT 76* 67*  ALKPHOS 118 144*  BILITOT 5.0* 3.4*  PROT 5.0* 5.6*  ALBUMIN 2.7* 2.6*   No results for input(s): LIPASE, AMYLASE in the last 72 hours. Cardiac Enzymes No results for input(s): CKTOTAL, CKMB, CKMBINDEX, TROPONINI in the last 72 hours.  BNP: BNP (last 3 results) No results for input(s): BNP in the last 8760 hours.  ProBNP (last 3 results) No results for input(s): PROBNP in the last 8760 hours.  D-Dimer No results for input(s): DDIMER in the last 72 hours. Hemoglobin A1C No results for input(s): HGBA1C in the last 72 hours. Fasting Lipid Panel No results for input(s): CHOL, HDL, LDLCALC, TRIG, CHOLHDL, LDLDIRECT in the last 72 hours. Thyroid Function Tests No results for input(s): TSH, T4TOTAL, T3FREE, THYROIDAB in the last 72 hours.  Invalid input(s): FREET3  Other results:     Imaging/Studies:  Dg Swallowing Func-speech Pathology  Result Date: 02/13/2017 Objective Swallowing Evaluation: Type of Study: MBS-Modified Barium Swallow Study Patient Details Name: MARIONNA GONIA MRN: [id number] Date of Birth: 01-10-1935 Today's Date: 02/13/2017 Time: SLP Start Time (ACUTE ONLY): 1130-SLP Stop Time (ACUTE ONLY): 1247 SLP Time Calculation (min) (ACUTE ONLY): 77 min Past Medical History: Past Medical History: Diagnosis Date . [name]   a. LHC 02/03/17 for unstable angina showed severe 3v CAD w/ sequential 70% & 80% p-mLAD, 99%  mLCx, CTO mRCA, elevated LV filling pressure . Colon polyp 2010 . Diabetes mellitus with complication ([place])  . Glaucoma  . Hypertension  Past Surgical History: Past Surgical History: Procedure Laterality Date . APPENDECTOMY   . BREAST BIOPSY Right 1999 . COLONOSCOPY  2010 . CORONARY ARTERY BYPASS GRAFT N/A 02/05/2017  Procedure: CORONARY ARTERY BYPASS GRAFTING (CABG) x 4                                                                               LIMA-LAD SEQ SVG-DIAG-OM SVG-PD;  Surgeon: Gaye Pollack, MD;  Location: [facility];  Service: Open Heart Surgery;  Laterality: N/A; . IABP INSERTION N/A 02/04/2017  Procedure: IABP Insertion;  Surgeon: Leonie Man, MD;  Location: [facility];  Service: Cardiovascular;  Laterality: N/A; . LEFT HEART CATH AND CORONARY ANGIOGRAPHY Left 02/03/2017  Procedure: Left Heart Cath and Coronary Angiography;  Surgeon: Nelva Bush, MD;  Location: [facility];  Service: Cardiovascular;  Laterality: Left; . MITRAL VALVE REPAIR N/A 02/05/2017  Procedure: MITRAL VALVE REPLACEMENT (MVR) WITH MAGNA MITRAL EASE PERICARDIAL BIOPROSTHESIS 25 MM;  Surgeon: Gaye Pollack, MD;  Location: [facility];  Service: Open Heart Surgery;  Laterality: N/A; . PARATHYROIDECTOMY    1986 . RIGHT HEART CATH N/A 02/04/2017  Procedure: Right Heart Cath;  Surgeon: Leonie Man, MD;  Location: [facility];  Service: Cardiovascular;  Laterality: N/A; . TEE WITHOUT CARDIOVERSION N/A 02/05/2017  Procedure: TRANSESOPHAGEAL ECHOCARDIOGRAM (TEE);  Surgeon: Gaye Pollack, MD;  Location: [facility];  Service: Open Heart Surgery;  Laterality: N/A; HPI: Ms. Pluta is a 82 year old woman with history of type 2 diabetes mellitus and hypertension, who presented to cardiology clinic due to acute chest pain that began 3 days ago while doing light housework. EKG showed abnormalities concerning for ischemia. Urgent cardiac cath revealed three-vessel CAD, including serial 70 and 80% proximal/mid LAD stenoses.  Underwent CABG x 4 2/15 and extubated 2/19. CXR No pneumothorax or pulmonary edema. Left lung base atelectasis and possible small pleural effusion. No Data Recorded Assessment / Plan / Recommendation CHL IP CLINICAL IMPRESSIONS 02/13/2017 Clinical Impression Pt demonstrates moderate oral dysphagia, though reassessment of lingual strength did not show any lateral weakness or deviation (pt observed to stick tongue straight, but  also sometimes curved it around a single tooth). Pt did orally hold all boluses for several seconds, requiring verbal cues to initiate swallow. Mild base of tongue residual also noted post swallow, though this appeared related to dry mucosa and thick liquids rather than weakness. If not cleared, these oral residuals mixed with secretions and penetrated the vestibule. Min verbal cue for a dry swallow cleared this well. Primary problem is delayed swallow initiation to the pyriform sinuses with thin and nectar thick textures resulting in sensed and silent aspiration as swallow was initiated. Phayngeal strength WNL. This was prevented with honey thick liquids. Recommend pt initiate a Dys 1 (puree) diet and honey thick liquids with full supervision and cues for a second swallow. Reviewed with RN. Will follow for tolerance and advancement of solids with dentures in place and improved endurance.  SLP Visit Diagnosis Dysphagia, oral phase (R13.11);Dysphagia, oropharyngeal phase (R13.12) Attention and concentration deficit following -- Frontal lobe and executive function deficit following -- Impact on safety and function Moderate aspiration risk   CHL IP TREATMENT RECOMMENDATION 02/13/2017 Treatment Recommendations Therapy as outlined in treatment plan below   Prognosis 02/13/2017 Prognosis for Safe Diet Advancement Good Barriers to Reach Goals -- Barriers/Prognosis Comment -- CHL IP DIET RECOMMENDATION 02/13/2017 SLP Diet Recommendations Honey thick liquids;Dysphagia 1 (Puree) solids Liquid Administration  via Cup;Spoon Medication Administration Whole meds with puree Compensations Slow rate;Small sips/bites;Multiple dry swallows after each bite/sip Postural Changes Remain semi-upright after after feeds/meals (Comment);Seated upright at 90 degrees   CHL IP OTHER RECOMMENDATIONS 02/13/2017 Recommended Consults -- Oral Care Recommendations Oral care BID Other Recommendations --   CHL IP FOLLOW UP RECOMMENDATIONS 02/13/2017 Follow up Recommendations Skilled Nursing facility   [facility] IP FREQUENCY AND DURATION 02/13/2017 Speech Therapy Frequency (ACUTE ONLY) min 2x/week Treatment Duration 2 weeks      CHL IP ORAL PHASE 02/13/2017 Oral Phase Impaired Oral - Pudding Teaspoon -- Oral - Pudding Cup -- Oral - Honey Teaspoon Delayed oral transit;Holding of bolus;Lingual/palatal residue Oral - Honey Cup -- Oral - Nectar Teaspoon -- Oral - Nectar Cup Delayed oral transit;Holding of bolus;Lingual/palatal residue Oral - Nectar Straw -- Oral - Thin Teaspoon -- Oral - Thin Cup Delayed oral transit;Holding of bolus;Lingual/palatal residue Oral - Thin Straw -- Oral - Puree Delayed oral transit;Holding of bolus;Lingual/palatal residue Oral - Mech Soft -- Oral - Regular -- Oral - Multi-Consistency -- Oral - Pill -- Oral Phase - Comment --  CHL IP PHARYNGEAL PHASE 02/13/2017 Pharyngeal Phase Impaired Pharyngeal- Pudding Teaspoon -- Pharyngeal -- Pharyngeal- Pudding Cup -- Pharyngeal -- Pharyngeal- Honey Teaspoon Delayed swallow initiation-pyriform sinuses;Penetration/Apiration after swallow Pharyngeal Material enters airway, CONTACTS cords and not ejected out;Material does not enter airway Pharyngeal- Honey Cup -- Pharyngeal -- Pharyngeal- Nectar Teaspoon -- Pharyngeal -- Pharyngeal- Nectar Cup Penetration/Aspiration during swallow;Penetration/Aspiration before swallow;Delayed swallow initiation-pyriform sinuses Pharyngeal Material enters airway, passes BELOW cords without attempt by patient to eject out (silent aspiration) Pharyngeal- Nectar Straw  -- Pharyngeal -- Pharyngeal- Thin Teaspoon -- Pharyngeal -- Pharyngeal- Thin Cup Delayed swallow initiation-pyriform sinuses;Penetration/Aspiration during swallow;Penetration/Aspiration before swallow Pharyngeal Material enters airway, passes BELOW cords and not ejected out despite cough attempt by patient Pharyngeal- Thin Straw -- Pharyngeal -- Pharyngeal- Puree Delayed swallow initiation-pyriform sinuses Pharyngeal -- Pharyngeal- Mechanical Soft -- Pharyngeal -- Pharyngeal- Regular -- Pharyngeal -- Pharyngeal- Multi-consistency -- Pharyngeal -- Pharyngeal- Pill -- Pharyngeal -- Pharyngeal Comment --  No flowsheet data found. No flowsheet data found. Herbie [place], [place] CCC-SLP [phone] Lynann Beaver 02/13/2017, 12:25 PM  Medications:     Scheduled Medications: . sodium chloride   Intravenous Once  . amiodarone  200 mg Oral BID  . aspirin  81 mg Oral Daily  . atorvastatin  80 mg Oral q1800  . chlorhexidine  15 mL Mouth Rinse BID  . Chlorhexidine Gluconate Cloth  6 each Topical Daily  . hydrALAZINE  25 mg Oral Q8H  . insulin aspart  0-15 Units Subcutaneous Q4H  . mouth rinse  15 mL Mouth Rinse q12n4p  . metoprolol tartrate  25 mg Oral BID  . pantoprazole  40 mg Oral Daily  . piperacillin-tazobactam (ZOSYN)  IV  3.375 g Intravenous Q12H  . sodium chloride flush  10-40 mL Intracatheter Q12H  . sodium chloride flush  3 mL Intravenous Q12H  . vancomycin  1,000 mg Intravenous Q48H  . Warfarin - Physician Dosing Inpatient   Does not apply q1800    Infusions: . sodium chloride    . dextrose 5 % and 0.45% NaCl 50 mL/hr at 02/13/17 2000  . lactated ringers Stopped (02/06/17 0100)  . lactated ringers 10 mL/hr at 02/13/17 2000    PRN Medications: hydrALAZINE, metoprolol, ondansetron (ZOFRAN) IV, RESOURCE THICKENUP CLEAR, sodium chloride flush, sodium chloride flush   Assessment/Plan   1. CAD [name] on 2/15 2. Severe MR with flail segment s/p MVR c/  bioprosthetic valve. 3. Cardiogenic shock: Echo pre-op with EF 40-45%, lateral akinesis, severe MR.     --Echo 2/22 EF 40% MVR ok 4. AKI 5. Shock Liver 6. Acute respiratory failure with hypoxemia requiring intubation 7. Thrombocytopenia: Post-op. 8. Tracheal aspirate with Strep pneumoniae 9. PAF 10. Leukocytosis  Making slow progress. Milrinone now off. Co-ox marginal. Main issue is progressive renal failure. I suspect she had episode of ATN as creatinine got better initially post-op and now worsening.  CVP ~15. Weight up 23 pounds from pre-op.   Will add low-dose dopamine watch closely for more AF. Resume IV lasix. Will continue IV amio. Will stop metoprolol and cut hydralazine to let BP rise to aid renal perfusion.   UA + with high WBC. Also possible aspiration. Now improving on vanc/zosyn. Will continue. Culture data remains negative. Low threshold for anti-fungal rx.   Continues with PAF on amio and lopressor. On warfarin for MV and PAF. INR 3.1  Would hold tonight's dose just in cas we are headed toward [facility],   Bensimhon, Daniel,MD 8:17 AM

## 2017-02-14 NOTE — Progress Notes (Signed)
  Speech Language Pathology Treatment: Dysphagia  Patient Details Name: [name] [name] MRN: [id number] DOB: May 31, 1935 Today's Date: 02/14/2017 Time: 8416-6063 SLP Time Calculation (min) (ACUTE ONLY): 20 min  Assessment / Plan / Recommendation Clinical Impression  Patient seen for dysphagia treatment. RN reports patient requiring fewer verbal cues to swallow with minimal oral holding. Assisted patient with performing oral care and repositioning to maximize safety for PO trials. Dentures present. Observed patient with honey-thick liquids via teaspoon and cup sips. Initially required verbal cues for dry swallow. Patient stated, "there's nothing to swallow. I already did." Provided education regarding results of MBS and purpose of dry swallow to clear pharyngeal residue and reduce risk for aspiration. Patient verbalized understanding and implemented with occasional verbal cues in subsequent trials. Oral holding appears improved, though patient continues to require intermittent verbal cue to initiate swallow. Noted throat clear x 2 following prolonged oral holding. No further signs of aspiration noted, patient implements dry swallow strategy with trials of puree. Planned for upgraded trial of soft solid; patient initially agreeable, but declined due to fatigue. Terminated session, recommend continuing current diet with full supervision and cuing for the above strategies. SLP will f/u for tolerance and upgrade as appropriate.  HPI        SLP Plan  Continue with current plan of care       Recommendations  Diet recommendations: Dysphagia 1 (puree);Honey-thick liquid Liquids provided via: Cup;Teaspoon Medication Administration: Crushed with puree Supervision: Patient able to self feed;Full supervision/cueing for compensatory strategies Compensations: Slow rate;Small sips/bites;Multiple dry swallows after each bite/sip Postural Changes and/or Swallow Maneuvers: Seated upright 90 degrees               Oral Care Recommendations: Oral care BID;Oral care before and after PO Follow up Recommendations: Skilled Nursing facility SLP Visit Diagnosis: Dysphagia, oral phase (R13.11);Dysphagia, oropharyngeal phase (R13.12) Plan: Continue with current plan of care       [place], [place] CF-SLP Speech-Language Pathologist [phone]   Aliene Altes 02/14/2017, 4:47 PM

## 2017-02-14 NOTE — Progress Notes (Signed)
Patient ID: [name] [name], female   DOB: 1935/05/10, 82 y.o.   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 25 MM (N/A)  Total Length of Stay:  LOS: 11 days   Subjective: Waked 75 feet, alert and talkative   Objective: Vital signs in last 24 hours: Temp:  [97.3 F (36.3 C)-98 F (36.7 C)] 97.9 F (36.6 C) (02/24 1204) Pulse Rate:  [70-89] 84 (02/24 1200) Cardiac Rhythm: Normal sinus rhythm (02/24 1200) Resp:  [15-29] 29 (02/24 1200) BP: (101-129)/(55-82) 110/58 (02/24 1200) SpO2:  [94 %-100 %] 95 % (02/24 1200) Weight:  [181 lb (82.1 kg)] 181 lb (82.1 kg) (02/24 0500)  Filed Weights   02/12/17 0500 02/13/17 0500 02/14/17 0500  Weight: 177 lb 4 oz (80.4 kg) 177 lb 0.5 oz (80.3 kg) 181 lb (82.1 kg)    Weight change: 3 lb 15.5 oz (1.8 kg)   Hemodynamic parameters for last 24 hours: CVP:  [4 mmHg-17 mmHg] (P) 12 mmHg  Intake/Output from previous day: 02/23 0701 - 02/24 0700 In: 1640 [P.O.:100; I.V.:1440; IV Piggyback:100] Out: 610 [Urine:610]  Intake/Output this shift: Total I/O In: 332.4 [P.O.:100; I.V.:145.4; IV Piggyback:87] Out: 275 [Urine:275]  Current Meds: Scheduled Meds: . sodium chloride   Intravenous Once  . amiodarone  200 mg Oral BID  . aspirin  81 mg Oral Daily  . atorvastatin  80 mg Oral q1800  . chlorhexidine  15 mL Mouth Rinse BID  . Chlorhexidine Gluconate Cloth  6 each Topical Daily  . furosemide  120 mg Intravenous BID  . insulin aspart  0-15 Units Subcutaneous Q4H  . mouth rinse  15 mL Mouth Rinse q12n4p  . pantoprazole  40 mg  Oral Daily  . piperacillin-tazobactam (ZOSYN)  IV  3.375 g Intravenous Q12H  . sodium chloride flush  10-40 mL Intracatheter Q12H  . sodium chloride flush  3 mL Intravenous Q12H  . vancomycin  1,000 mg Intravenous Q48H  . Warfarin - Physician Dosing Inpatient   Does not apply q1800   Continuous Infusions: . sodium chloride    . dextrose 5 % and 0.45% NaCl 50 mL/hr at 02/13/17 2000  . DOPamine 2.5 mcg/kg/min (02/14/17 1100)  . lactated ringers Stopped (02/06/17 0100)  . lactated ringers 10 mL/hr at 02/13/17 2000   PRN Meds:.hydrALAZINE, metoprolol, ondansetron (ZOFRAN) IV, RESOURCE THICKENUP CLEAR, sodium chloride flush, sodium chloride flush  General appearance: alert and cooperative Neurologic: intact Heart: irregularly irregular rhythm Lungs: diminished breath sounds bibasilar Abdomen: soft, non-tender; bowel sounds normal; no masses,  no organomegaly Extremities: extremities normal, atraumatic, no cyanosis or edema  and Homans sign is negative, no sign of DVT Wound: sternum stable without evidence of infection  Lab Results: CBC: Recent Labs  02/13/17 0352 02/14/17 0413  WBC 20.7* 20.9*  HGB 9.4* 9.2*  HCT 28.7* 29.0*  PLT 267 320   BMET:  Recent Labs  02/13/17 0352 02/14/17 0413  NA 144 144  K 3.6 3.4*  CL 108 108  CO2 24 25  GLUCOSE 113* 243*  BUN 64* 71*  CREATININE 2.84* 3.19*  CALCIUM 7.9* 8.1*    CMET: Lab Results  Component Value Date   WBC 20.9 (H) 02/14/2017   HGB 9.2 (L) 02/14/2017   HCT 29.0 (L) 02/14/2017   PLT 320 02/14/2017   GLUCOSE 243 (H) 02/14/2017   CHOL 156 02/04/2017   TRIG 104 02/04/2017   HDL 42 02/04/2017   LDLCALC 93 02/04/2017   ALT 67 (H) 02/13/2017   AST 77 (H) 02/13/2017   NA 144 02/14/2017   K 3.4 (L) 02/14/2017   CL 108 02/14/2017   CREATININE 3.19 (H) 02/14/2017   BUN 71 (H) 02/14/2017   CO2 25 02/14/2017   TSH 0.576 02/03/2017   INR 3.08 02/14/2017   HGBA1C 6.3 (H) 02/03/2017      PT/INR:  Recent Labs   02/14/17 0413  LABPROT 32.4*  INR 3.08   Radiology: Dg Chest Port 1 View  Result Date: 02/14/2017 CLINICAL DATA:  CABG 02/05/2017. EXAM: PORTABLE CHEST 1 VIEW COMPARISON:  02/12/2017. FINDINGS: Trachea is midline. Right PICC tip projects over the low SVC. Heart is enlarged, stable. Mitral valve replacement. Thoracic aorta is calcified. Seven intact sternotomy wires. Lungs are somewhat low in volume with mild interstitial prominence and indistinctness. Left lower lobe airspace opacification. Small left pleural effusion. Findings are similar to examination of 2 days prior. IMPRESSION: Probable mild edema with left lower lobe collapse/ consolidation and small left pleural effusion. Electronically Signed   By: Lorin Picket M.D.   On: 02/14/2017 09:21   Recent Results (from the past 240 hour(s))  Surgical pcr screen     Status: None   Collection Time: 02/04/17  9:56 PM  Result Value Ref Range Status   MRSA, PCR NEGATIVE NEGATIVE Final   Staphylococcus aureus NEGATIVE NEGATIVE Final    Comment:        The Xpert SA Assay (FDA approved for NASAL specimens in patients over 51 years of age), is one component of a comprehensive surveillance program.  Test performance has been validated by [facility] for patients greater than or equal to 40 year old. It is not intended to diagnose infection nor to guide or monitor treatment.   Culture, respiratory (NON-Expectorated)     Status: None   Collection Time: 02/05/17  9:44 AM  Result Value Ref Range Status   Specimen Description TRACHEAL ASPIRATE  Final   Special Requests Normal  Final   Gram Stain   Final    ABUNDANT WBC PRESENT,BOTH PMN AND MONONUCLEAR ABUNDANT GRAM POSITIVE COCCI IN PAIRS MODERATE GRAM NEGATIVE COCCI IN PAIRS FEW GRAM NEGATIVE RODS    Culture ABUNDANT STREPTOCOCCUS PNEUMONIAE  Final   Report Status 02/09/2017 FINAL  Final   Organism ID, Bacteria STREPTOCOCCUS PNEUMONIAE  Final      Susceptibility   Streptococcus  pneumoniae - MIC*    ERYTHROMYCIN 2 RESISTANT Resistant     LEVOFLOXACIN 0.5 SENSITIVE Sensitive     PENICILLIN (meningitis) <=0.06 SENSITIVE Sensitive     PENICILLIN (non-meningitis) <=0.06 SENSITIVE Sensitive     CEFTRIAXONE (non-meningitis) <=  0.12 SENSITIVE Sensitive     CEFTRIAXONE (meningitis) <=0.12 SENSITIVE Sensitive     * ABUNDANT STREPTOCOCCUS PNEUMONIAE  Culture, Urine     Status: None   Collection Time: 02/07/17 10:20 AM  Result Value Ref Range Status   Specimen Description URINE, CATHETERIZED  Final   Special Requests NONE  Final   Culture NO GROWTH  Final   Report Status 02/08/2017 FINAL  Final  Culture, Urine     Status: None   Collection Time: 02/12/17  7:40 AM  Result Value Ref Range Status   Specimen Description URINE, CATHETERIZED  Final   Special Requests Normal  Final   Culture NO GROWTH  Final   Report Status 02/13/2017 FINAL  Final  Culture, blood (routine x 2)     Status: None (Preliminary result)   Collection Time: 02/12/17 10:07 AM  Result Value Ref Range Status   Specimen Description BLOOD LEFT ARM  Final   Special Requests IN PEDIATRIC BOTTLE 1CC  Final   Culture NO GROWTH 2 DAYS  Final   Report Status PENDING  Incomplete  Culture, blood (routine x 2)     Status: None (Preliminary result)   Collection Time: 02/12/17 10:10 AM  Result Value Ref Range Status   Specimen Description BLOOD LEFT ARM  Final   Special Requests IN PEDIATRIC BOTTLE 1CC  Final   Culture NO GROWTH 2 DAYS  Final   Report Status PENDING  Incomplete    Assessment/Plan: S/P Procedure(s) (LRB): CORONARY ARTERY BYPASS GRAFTING (CABG) x 4                                                                               LIMA-LAD SEQ SVG-DIAG-OM SVG-PD (N/A) TRANSESOPHAGEAL ECHOCARDIOGRAM (TEE) (N/A) 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

## 2017-02-14 NOTE — Progress Notes (Signed)
Patient ID: [name] [name], female   DOB: Dec 22, 1935, 82 y.o.   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 200   I.V. (mL/kg) 475.8 (5.8)   IV Piggyback 374   Total Intake(mL/kg) 1049.8 (12.8)   Urine (mL/kg/hr) 650 (0.6)   Stool    Total Output 650   Net +399.8         . sodium chloride    . dextrose 5 % and 0.45% NaCl 50 mL/hr at 02/13/17 2000  . DOPamine 2.5 mcg/kg/min (02/14/17 1900)  . lactated ringers Stopped (02/06/17 0100)  . lactated ringers 10 mL/hr at 02/13/17 2000     Lab Results  Component Value Date   WBC 20.9 (H) 02/14/2017   HGB 9.2 (L) 02/14/2017   HCT 29.0 (L) 02/14/2017   PLT 320 02/14/2017   GLUCOSE 243 (H) 02/14/2017   CHOL 156 02/04/2017   TRIG 104 02/04/2017   HDL 42 02/04/2017   LDLCALC 93 02/04/2017   ALT 67 (H) 02/13/2017   AST 77 (H) 02/13/2017   NA 144 02/14/2017   K 3.4 (L) 02/14/2017   CL 108 02/14/2017   CREATININE 3.19 (H) 02/14/2017   BUN 71 (H) 02/14/2017   CO2 25 02/14/2017   TSH 0.576 02/03/2017   INR 3.08 02/14/2017   HGBA1C 6.3 (H) 02/03/2017   Stable day Repeat wbc in am Still on low dose dopamine  Grace Isaac MD  Beeper [phone] Office [phone] 02/14/2017 7:24 PM

## 2017-02-14 NOTE — Progress Notes (Signed)
Pharmacy Antibiotic Note  [name] [name] is a 82 y.o. female admitted on 02/03/2017 with pneumonia.  Pharmacy has been consulted for vancomycin and zosyn dosing.  Patient with strep pneumo bronchitis has been receiving ceftriaxone since 2/17. Patient now appears to have worsening pneumonia. No fevers noted, however WBC remains elevated. Will renally adjust Zosyn and order vancomycin random for 2/26 with AM labs.    Plan: Zosyn 2.25g IV q8 hours Vancomycin 1000mg  IV q48 hours  Height: 5\' 6"  (167.6 cm) Weight: 181 lb (82.1 kg) IBW/kg (Calculated) : 59.3  Temp (24hrs), Avg:97.7 F (36.5 C), Min:97.3 F (36.3 C), Max:98 F (36.7 C)   Recent Labs Lab 02/10/17 0530 02/11/17 0338 02/11/17 1618 02/11/17 1637 02/12/17 0402 02/13/17 0352 02/14/17 0413  WBC 11.5* 16.2*  --   --  21.2* 20.7* 20.9*  CREATININE 2.33* 2.20* 3.00* 2.58* 2.64* 2.84* 3.19*    Estimated Creatinine Clearance: 15.4 mL/min (by C-G formula based on SCr of 3.19 mg/dL (H)).    No Known Allergies  Antimicrobials this admission: CTX 2/17 >>2/22 Vanc 2/22>> Zosyn 2/22>>  Microbiology results: 2/22 BCx: sent 2/22 Urine:sent 2/17 UCx: neg 2/15 TA: strep pneumo S rocephin 2/14 MRSA PCR: neg  Thank you for allowing pharmacy to be a part of this patient's care.  Georga Bora, PharmD Clinical Pharmacist Pager: [phone] 02/14/2017 2:02 PM

## 2017-02-15 ENCOUNTER — Inpatient Hospital Stay (HOSPITAL_COMMUNITY): Payer: Medicare Other

## 2017-02-15 LAB — BASIC METABOLIC PANEL
Anion gap: 13 (ref 5–15)
BUN: 72 mg/dL — ABNORMAL HIGH (ref 6–20)
CO2: 26 mmol/L (ref 22–32)
Calcium: 8.6 mg/dL — ABNORMAL LOW (ref 8.9–10.3)
Chloride: 106 mmol/L (ref 101–111)
Creatinine, Ser: 3.45 mg/dL — ABNORMAL HIGH (ref 0.44–1.00)
GFR calc Af Amer: 14 mL/min — ABNORMAL LOW (ref >60)
GFR calc non Af Amer: 12 mL/min — ABNORMAL LOW (ref >60)
Glucose, Bld: 182 mg/dL — ABNORMAL HIGH (ref 65–99)
Potassium: 3 mmol/L — ABNORMAL LOW (ref 3.5–5.1)
Sodium: 145 mmol/L (ref 135–145)

## 2017-02-15 LAB — GLUCOSE, CAPILLARY
GLUCOSE-CAPILLARY: 144 mg/dL — AB (ref 65–99)
GLUCOSE-CAPILLARY: 138 mg/dL — AB (ref 65–99)
GLUCOSE-CAPILLARY: 178 mg/dL — AB (ref 65–99)
GLUCOSE-CAPILLARY: 98 mg/dL (ref 65–99)
Glucose-Capillary: 112 mg/dL — ABNORMAL HIGH (ref 65–99)
Glucose-Capillary: 173 mg/dL — ABNORMAL HIGH (ref 65–99)

## 2017-02-15 LAB — CBC
HCT: 30.3 % — ABNORMAL LOW (ref 36.0–46.0)
Hemoglobin: 9.7 g/dL — ABNORMAL LOW (ref 12.0–15.0)
MCH: 24.8 pg — ABNORMAL LOW (ref 26.0–34.0)
MCHC: 32 g/dL (ref 30.0–36.0)
MCV: 77.5 fL — ABNORMAL LOW (ref 78.0–100.0)
Platelets: 337 10*3/uL (ref 150–400)
RBC: 3.91 MIL/uL (ref 3.87–5.11)
RDW: 23.4 % — ABNORMAL HIGH (ref 11.5–15.5)
WBC: 22.9 10*3/uL — ABNORMAL HIGH (ref 4.0–10.5)

## 2017-02-15 LAB — COOXEMETRY PANEL
Carboxyhemoglobin: 1.6 % — ABNORMAL HIGH (ref 0.5–1.5)
Methemoglobin: 1 % (ref 0.0–1.5)
O2 Saturation: 62.8 %
Total hemoglobin: 9.9 g/dL — ABNORMAL LOW (ref 12.0–16.0)

## 2017-02-15 LAB — PROTIME-INR
INR: 3.5
PROTHROMBIN TIME: 35.9 s — AB (ref 11.4–15.2)

## 2017-02-15 MED ORDER — AMIODARONE LOAD VIA INFUSION
150.0000 mg | Freq: Once | INTRAVENOUS | Status: AC
  Administered 2017-02-15: 150 mg via INTRAVENOUS
  Filled 2017-02-15: qty 83.34

## 2017-02-15 MED ORDER — MEROPENEM 1 G IV SOLR
1.0000 g | Freq: Two times a day (BID) | INTRAVENOUS | Status: DC
  Administered 2017-02-15 – 2017-02-16 (×3): 1 g via INTRAVENOUS
  Filled 2017-02-15 (×3): qty 1

## 2017-02-15 MED ORDER — PANTOPRAZOLE SODIUM 40 MG IV SOLR
40.0000 mg | INTRAVENOUS | Status: DC
  Administered 2017-02-15 – 2017-02-23 (×10): 40 mg via INTRAVENOUS
  Filled 2017-02-15 (×10): qty 40

## 2017-02-15 MED ORDER — POTASSIUM CHLORIDE CRYS ER 20 MEQ PO TBCR
40.0000 meq | EXTENDED_RELEASE_TABLET | Freq: Once | ORAL | Status: AC
  Administered 2017-02-15: 40 meq via ORAL
  Filled 2017-02-15: qty 2

## 2017-02-15 MED ORDER — AMIODARONE HCL IN DEXTROSE 360-4.14 MG/200ML-% IV SOLN
30.0000 mg/h | INTRAVENOUS | Status: DC
  Administered 2017-02-16 – 2017-02-17 (×3): 30 mg/h via INTRAVENOUS
  Filled 2017-02-15 (×3): qty 200

## 2017-02-15 MED ORDER — AMIODARONE HCL IN DEXTROSE 360-4.14 MG/200ML-% IV SOLN
60.0000 mg/h | INTRAVENOUS | Status: AC
  Administered 2017-02-15 (×2): 60 mg/h via INTRAVENOUS
  Filled 2017-02-15 (×2): qty 200

## 2017-02-15 NOTE — Progress Notes (Signed)
Patient ID: [name] [name], female   DOB: 06-03-1935, 82 y.o.   MRN: [id number] Patient ID: [name] [name], female   DOB: 09-May-1935, 82 y.o.   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 25 MM (N/A)  Total Length of Stay:  LOS: 12 days   Subjective: Walked 200 feet today , feels better today alert and talkative dyspneic with much activity  Objective: Vital signs in last 24 hours: Temp:  [97.4 F (36.3 C)-98.4 F (36.9 C)] 98 F (36.7 C) (02/25 0800) Pulse Rate:  [75-92] 91 (02/25 0500) Cardiac Rhythm: Normal sinus rhythm (02/25 0400) Resp:  [13-29] 20 (02/25 0600) BP: (96-143)/(58-80) 143/68 (02/25 0500) SpO2:  [95 %-100 %] 98 % (02/25 0500) Weight:  [180 lb 12.4 oz (82 kg)] 180 lb 12.4 oz (82 kg) (02/25 0500)  Filed Weights   02/13/17 0500 02/14/17 0500 02/15/17 0500  Weight: 177 lb 0.5 oz (80.3 kg) 181 lb (82.1 kg) 180 lb 12.4 oz (82 kg)    Weight change: -3.5 oz (-0.1 kg)   Hemodynamic parameters for last 24 hours: CVP:  [10 mmHg-16 mmHg] 10 mmHg  Intake/Output from previous day: 02/24 0701 - 02/25 0700 In: 2191.6 [P.O.:540; I.V.:1177.6; IV Piggyback:474] Out: 7342 [Urine:1750]  Intake/Output this shift: No intake/output data recorded.  Current Meds: Scheduled Meds: . sodium chloride   Intravenous Once  . amiodarone  200 mg Oral BID  . aspirin  81 mg Oral Daily  . atorvastatin  80 mg Oral q1800  . chlorhexidine  15 mL Mouth Rinse BID  . Chlorhexidine Gluconate Cloth  6 each Topical Daily  . furosemide  120 mg Intravenous BID  . insulin  aspart  0-15 Units Subcutaneous Q4H  . mouth rinse  15 mL Mouth Rinse q12n4p  . pantoprazole  40 mg Oral Daily  . piperacillin-tazobactam (ZOSYN)  IV  2.25 g Intravenous Q8H  . sodium chloride flush  10-40 mL Intracatheter Q12H  . sodium chloride flush  3 mL Intravenous Q12H  . vancomycin  1,000 mg Intravenous Q48H  . Warfarin - Physician Dosing Inpatient   Does not apply q1800   Continuous Infusions: . sodium chloride    . dextrose 5 % and 0.45% NaCl 50 mL/hr at 02/14/17 2000  . DOPamine 2.5 mcg/kg/min (02/14/17 1900)  . lactated ringers Stopped (02/06/17 0100)  . lactated ringers 10 mL/hr at 02/14/17 2000   PRN Meds:.hydrALAZINE, metoprolol, ondansetron (ZOFRAN) IV, RESOURCE THICKENUP CLEAR, sodium chloride flush, sodium chloride flush  General appearance: alert and cooperative Neurologic: intact Heart: irregularly irregular rhythm Lungs: diminished breath sounds bibasilar Abdomen:  soft, non-tender; bowel sounds normal; no masses,  no organomegaly Extremities: extremities normal, atraumatic, no cyanosis or edema and Homans sign is negative, no sign of DVT Wound: sternum stable without evidence of infection  Lab Results: CBC:  Recent Labs  02/14/17 0413 02/15/17 0420  WBC 20.9* 22.9*  HGB 9.2* 9.7*  HCT 29.0* 30.3*  PLT 320 337   BMET:   Recent Labs  02/14/17 0413 02/15/17 0420  NA 144 145  K 3.4* 3.0*  CL 108 106  CO2 25 26  GLUCOSE 243* 182*  BUN 71* 72*  CREATININE 3.19* 3.45*  CALCIUM 8.1* 8.6*    CMET: Lab Results  Component Value Date   WBC 22.9 (H) 02/15/2017   HGB 9.7 (L) 02/15/2017   HCT 30.3 (L) 02/15/2017   PLT 337 02/15/2017   GLUCOSE 182 (H) 02/15/2017   CHOL 156 02/04/2017   TRIG 104 02/04/2017   HDL 42 02/04/2017   LDLCALC 93 02/04/2017   ALT 67 (H) 02/13/2017   AST 77 (H) 02/13/2017   NA 145 02/15/2017   K 3.0 (L) 02/15/2017   CL 106 02/15/2017   CREATININE 3.45 (H) 02/15/2017   BUN 72 (H) 02/15/2017   CO2 26 02/15/2017   TSH  0.576 02/03/2017   INR 3.50 02/15/2017   HGBA1C 6.3 (H) 02/03/2017      PT/INR:   Recent Labs  02/15/17 0420  LABPROT 35.9*  INR 3.50   Radiology: No results found. Recent Results (from the past 240 hour(s))  Culture, respiratory (NON-Expectorated)     Status: None   Collection Time: 02/05/17  9:44 AM  Result Value Ref Range Status   Specimen Description TRACHEAL ASPIRATE  Final   Special Requests Normal  Final   Gram Stain   Final    ABUNDANT WBC PRESENT,BOTH PMN AND MONONUCLEAR ABUNDANT GRAM POSITIVE COCCI IN PAIRS MODERATE GRAM NEGATIVE COCCI IN PAIRS FEW GRAM NEGATIVE RODS    Culture ABUNDANT STREPTOCOCCUS PNEUMONIAE  Final   Report Status 02/09/2017 FINAL  Final   Organism ID, Bacteria STREPTOCOCCUS PNEUMONIAE  Final      Susceptibility   Streptococcus pneumoniae - MIC*    ERYTHROMYCIN 2 RESISTANT Resistant     LEVOFLOXACIN 0.5 SENSITIVE Sensitive     PENICILLIN (meningitis) <=0.06 SENSITIVE Sensitive     PENICILLIN (non-meningitis) <=0.06 SENSITIVE Sensitive     CEFTRIAXONE (non-meningitis) <=0.12 SENSITIVE Sensitive     CEFTRIAXONE (meningitis) <=0.12 SENSITIVE Sensitive     * ABUNDANT STREPTOCOCCUS PNEUMONIAE  Culture, Urine     Status: None   Collection Time: 02/07/17 10:20 AM  Result Value Ref Range Status   Specimen Description URINE, CATHETERIZED  Final   Special Requests NONE  Final   Culture NO GROWTH  Final   Report Status 02/08/2017 FINAL  Final  Culture, Urine     Status: None   Collection Time: 02/12/17  7:40 AM  Result Value Ref Range Status   Specimen Description URINE, CATHETERIZED  Final   Special Requests Normal  Final   Culture NO GROWTH  Final   Report Status 02/13/2017 FINAL  Final  Culture, blood (routine x 2)     Status: None (Preliminary result)   Collection Time: 02/12/17 10:07 AM  Result Value Ref Range Status   Specimen Description BLOOD LEFT ARM  Final   Special Requests IN PEDIATRIC BOTTLE 1CC  Final   Culture NO GROWTH 2  DAYS  Final   Report Status PENDING  Incomplete  Culture, blood (routine x  2)     Status: None (Preliminary result)   Collection Time: 02/12/17 10:10 AM  Result Value Ref Range Status   Specimen Description BLOOD LEFT ARM  Final   Special Requests IN PEDIATRIC BOTTLE 1CC  Final   Culture NO GROWTH 2 DAYS  Final   Report Status PENDING  Incomplete    Assessment/Plan: S/P Procedure(s) (LRB): CORONARY ARTERY BYPASS GRAFTING (CABG) x 4                                                                               LIMA-LAD SEQ SVG-DIAG-OM SVG-PD (N/A) TRANSESOPHAGEAL ECHOCARDIOGRAM (TEE) (N/A) MITRAL VALVE REPLACEMENT (MVR) WITH MAGNA MITRAL EASE PERICARDIAL BIOPROSTHESIS 25 MM (N/A) Mobilize Diabetes control Continue ABX therapy due to elevated wbc- incisions ok, strep in sputum culture other culture negative , no obvious infection  Cr increased to 3.45  remains on dopamine, cvp down to 8 hold lasix today  Discussed with Dr Hayden Pedro - adjust  d/c vancomycin to avoid further renal inj,  Hold coumadin  Grace Isaac 02/15/2017 8:09 AM

## 2017-02-15 NOTE — Progress Notes (Signed)
Pharmacy Antibiotic Note  [name] [name] is a 82 y.o. female admitted on 02/03/2017 with pneumonia. Patient with strep pneumo bronchitis on vanc/zosyn however not improving so vanc/zosyn dc'd and pharmacy has been consulted for meropenem dosing   No fevers noted, however WBC remains elevated and has increased to ~23. SCr worsened to 3.45, CrCl ~14 ml/min - not on HD  Plan: Meropenem 1000 mg IV q12h Monitor clinical picture, WBC, culture data, Tmax Follow up renal function   Height: 5\' 6"  (167.6 cm) Weight: 180 lb 12.4 oz (82 kg) IBW/kg (Calculated) : 59.3  Temp (24hrs), Avg:98 F (36.7 C), Min:97.4 F (36.3 C), Max:98.4 F (36.9 C)   Recent Labs Lab 02/11/17 0338  02/11/17 1637 02/12/17 0402 02/13/17 0352 02/14/17 0413 02/15/17 0420  WBC 16.2*  --   --  21.2* 20.7* 20.9* 22.9*  CREATININE 2.20*  < > 2.58* 2.64* 2.84* 3.19* 3.45*  < > = values in this interval not displayed.  Estimated Creatinine Clearance: 14.3 mL/min (by C-G formula based on SCr of 3.45 mg/dL (H)).    No Known Allergies  Antimicrobials this admission: CTX 2/17 >>2/22 Vanc 2/22>> 2/25 Zosyn 2/22>> 2/25 Meropenem 2/25>>  Microbiology results: 2/22 BCx: ngtd 2/22 Urine: ngtd - final 2/17 UCx: ngtd - final 2/15 TA: strep pneumo  2/14 MRSA PCR: neg  Thank you for allowing pharmacy to be a part of this patient's care.  Carlean Jews, Pharm.D. PGY1 Pharmacy Resident 2/25/20188:59 AM Pager [phone]

## 2017-02-15 NOTE — Progress Notes (Signed)
Dr Haroldine Laws paged. Pt went into SVT upper 140s-150s. Pt asymptomatic and bp 110s/70s. Pt then went into afib 110s-130s. Orders given to start amio drip and bolus given. Will cont to monitor and assess pt.

## 2017-02-15 NOTE — Progress Notes (Signed)
Patient ID: [name] [name], female   DOB: [birth year], 81 y.o.   MRN: [id number]     Advanced Heart Failure Rounding Note  PCP:  Primary Cardiologist:   Subjective:    S/p emergent CABG x 4 and bioprosthetic MVR for LCX NSTEMI with flail MV on 2/15  Remains weak but able to walk 200 feet. Failed swallowed study again.   On dopamine 2.5. Diuresed well with high-dose lasix. CVP 15->8 ,Creatinine 3.2-> 3.4 Co-ox 63%  WBC continues to increase despite vanc/zosyn.  Now 23K.  Blood and ucx negative.   Echo with EF 40% and stable MVR  In NSR on IV amio  Objective:   Weight Range: 82 kg (180 lb 12.4 oz) Body mass index is 29.18 kg/m.   Vital Signs:   Temp:  [97.4 F (36.3 C)-98.4 F (36.9 C)] 98 F (36.7 C) (02/25 0800) Pulse Rate:  [75-93] 89 (02/25 0800) Resp:  [13-29] 14 (02/25 0800) BP: (96-143)/(58-80) 112/63 (02/25 0800) SpO2:  [95 %-100 %] 98 % (02/25 0800) Weight:  [82 kg (180 lb 12.4 oz)] 82 kg (180 lb 12.4 oz) (02/25 0500) Last BM Date: 02/14/17  Weight change: Filed Weights   02/13/17 0500 02/14/17 0500 02/15/17 0500  Weight: 80.3 kg (177 lb 0.5 oz) 82.1 kg (181 lb) 82 kg (180 lb 12.4 oz)    Intake/Output:   Intake/Output Summary (Last 24 hours) at 02/15/17 1941 Last data filed at 02/15/17 0800  Gross per 24 hour  Intake          2259.18 ml  Output             1750 ml  Net           509.18 ml     Physical Exam: General:  Sitting in chair. NAD. weak HEENT: normal Neck: supple.  Carotids: JVP 8. 2+ bilat; no bruits. No lymphadenopathy or thyromegaly appreciated. Cor: PMI nondisplaced. irregular. No M/G/R noted.   Lungs: Decreased in the bases.  Abdomen: soft, ND, no HSM. No bruits or masses. +BS  Extremities: no cyanosis, clubbing, rash. RUE PICC. RLE LLE 1+ edema. .  Neuro: A&Ox3 follows commands and is oriented. Moves all 4  Telemetry: Sinus Rhythm 80s frequent PACs Personally reviewed  Labs: CBC  Recent Labs  02/14/17 0413 02/15/17 0420    WBC 20.9* 22.9*  HGB 9.2* 9.7*  HCT 29.0* 30.3*  MCV 77.5* 77.5*  PLT 320 740   Basic Metabolic Panel  Recent Labs  02/13/17 0352 02/14/17 0413 02/15/17 0420  NA 144 144 145  K 3.6 3.4* 3.0*  CL 108 108 106  CO2 24 25 26   GLUCOSE 113* 243* 182*  BUN 64* 71* 72*  CREATININE 2.84* 3.19* 3.45*  CALCIUM 7.9* 8.1* 8.6*  MG 2.0  --   --   PHOS 3.7  --   --    Liver Function Tests  Recent Labs  02/13/17 0352  AST 77*  ALT 67*  ALKPHOS 144*  BILITOT 3.4*  PROT 5.6*  ALBUMIN 2.6*   No results for input(s): LIPASE, AMYLASE in the last 72 hours. Cardiac Enzymes No results for input(s): CKTOTAL, CKMB, CKMBINDEX, TROPONINI in the last 72 hours.  BNP: BNP (last 3 results) No results for input(s): BNP in the last 8760 hours.  ProBNP (last 3 results) No results for input(s): PROBNP in the last 8760 hours.   D-Dimer No results for input(s): DDIMER in the last 72 hours. Hemoglobin A1C No results for input(s): HGBA1C in  the last 72 hours. Fasting Lipid Panel No results for input(s): CHOL, HDL, LDLCALC, TRIG, CHOLHDL, LDLDIRECT in the last 72 hours. Thyroid Function Tests No results for input(s): TSH, T4TOTAL, T3FREE, THYROIDAB in the last 72 hours.  Invalid input(s): FREET3  Other results:     Imaging/Studies:  No results found.    Medications:     Scheduled Medications: . sodium chloride   Intravenous Once  . amiodarone  200 mg Oral BID  . aspirin  81 mg Oral Daily  . atorvastatin  80 mg Oral q1800  . chlorhexidine  15 mL Mouth Rinse BID  . Chlorhexidine Gluconate Cloth  6 each Topical Daily  . furosemide  120 mg Intravenous BID  . insulin aspart  0-15 Units Subcutaneous Q4H  . mouth rinse  15 mL Mouth Rinse q12n4p  . pantoprazole  40 mg Oral Daily  . piperacillin-tazobactam (ZOSYN)  IV  2.25 g Intravenous Q8H  . sodium chloride flush  10-40 mL Intracatheter Q12H  . sodium chloride flush  3 mL Intravenous Q12H  . vancomycin  1,000 mg Intravenous  Q48H  . Warfarin - Physician Dosing Inpatient   Does not apply q1800    Infusions: . sodium chloride    . dextrose 5 % and 0.45% NaCl 50 mL/hr at 02/14/17 2000  . DOPamine 2.5 mcg/kg/min (02/15/17 0800)  . lactated ringers Stopped (02/06/17 0100)  . lactated ringers 10 mL/hr at 02/14/17 2000    PRN Medications: hydrALAZINE, metoprolol, ondansetron (ZOFRAN) IV, RESOURCE THICKENUP CLEAR, sodium chloride flush, sodium chloride flush   Assessment/Plan   1. CAD [name] on 2/15 2. Severe MR with flail segment s/p MVR c/ bioprosthetic valve. 3. Cardiogenic shock: Echo pre-op with EF 40-45%, lateral akinesis, severe MR.     --Echo 2/22 EF 40% MVR ok 4. AKI 5. Shock Liver 6. Acute respiratory failure with hypoxemia requiring intubation 7. Thrombocytopenia: Post-op. 8. Tracheal aspirate with Strep pneumoniae 9. PAF 10. Leukocytosis  Making slow progress. Milrinone now off. Co-ox ok. Diuresed well yesterday but creatinine continues to trend up. CVP now down to 8. Stop lasix. Continue dopamine. I suspect she had episode of ATN as creatinine got better initially post-op and now worsening.  Weight still up from baseline. May need Renal to see. Off metoprolol and cut hydralazine yesterday to let BP rise to aid renal perfusion.   WBC increasing despite van/zosyn. Cultures negative except for previous strep pneumo in sputum.. Will switch to meropenem. May also need anti-fungal rx.   Maintaining NSR on IV amio. Can switch to po. On warfarin for MV and PAF. INR 3.5  Holding tonight's dose just in cas we are headed toward CVVHD.  D/w Dr. Servando Snare at bedside  The patient is critically ill with multiple organ systems failure and requires high complexity decision making for assessment and support, frequent evaluation and titration of therapies, application of advanced monitoring technologies and extensive interpretation of multiple databases.   Critical Care Time devoted to patient care  services described in this note is 35 Minutes.   Ritik Stavola,MD 8:21 AM

## 2017-02-15 NOTE — Progress Notes (Signed)
Patient ID: [name] [name], female   DOB: 08-Sep-1935, 82 y.o.   MRN: [id number] EVENING ROUNDS NOTE :     [address]       [place] [address]             [phone]                 10 Days Post-Op Procedure(s) (LRB): CORONARY ARTERY BYPASS GRAFTING (CABG) x 4                                                                               LIMA-LAD SEQ SVG-DIAG-OM SVG-PD (N/A) TRANSESOPHAGEAL ECHOCARDIOGRAM (TEE) (N/A) MITRAL VALVE REPLACEMENT (MVR) WITH MAGNA MITRAL EASE PERICARDIAL BIOPROSTHESIS 25 MM (N/A)  Total Length of Stay:  LOS: 12 days  BP 121/68   Pulse 89   Temp 98.1 F (36.7 C) (Oral)   Resp 16   Ht 5\' 6"  (1.676 m)   Wt 180 lb 12.4 oz (82 kg)   SpO2 99%   BMI 29.18 kg/m   .Intake/Output      02/25 0701 - 02/26 0700   P.O. 500   I.V. (mL/kg) 898.8 (11)   IV Piggyback 100   Total Intake(mL/kg) 1498.8 (18.3)   Urine (mL/kg/hr) 750 (0.7)   Stool 0 (0)   Total Output 750   Net +748.8       Stool Occurrence 1 x     . sodium chloride    . amiodarone 60 mg/hr (02/15/17 1800)   Followed by  . amiodarone    . dextrose 5 % and 0.45% NaCl 50 mL/hr at 02/15/17 1656  . DOPamine 2.5 mcg/kg/min (02/15/17 1800)  . lactated ringers Stopped (02/06/17 0100)  . lactated ringers 10 mL/hr at 02/14/17 2000     Lab Results  Component Value Date   WBC 22.9 (H) 02/15/2017   HGB 9.7 (L) 02/15/2017   HCT 30.3 (L) 02/15/2017   PLT 337 02/15/2017   GLUCOSE 182 (H) 02/15/2017   CHOL 156 02/04/2017   TRIG 104 02/04/2017   HDL 42 02/04/2017   LDLCALC 93 02/04/2017   ALT 67 (H) 02/13/2017   AST 77 (H) 02/13/2017   NA 145 02/15/2017   K 3.0 (L) 02/15/2017   CL 106 02/15/2017   CREATININE 3.45 (H) 02/15/2017   BUN 72 (H) 02/15/2017   CO2 26 02/15/2017   TSH 0.576 02/03/2017   INR 3.50 02/15/2017   HGBA1C 6.3 (H) 02/03/2017   Episode of afib with rapid rate, Cordarone bolus and drip started by cardiology, now sinus   [place]  [phone] Office [phone] 02/15/2017 7:22 PM

## 2017-02-16 ENCOUNTER — Inpatient Hospital Stay (HOSPITAL_COMMUNITY): Payer: Medicare Other

## 2017-02-16 LAB — CBC
HCT: 28.5 % — ABNORMAL LOW (ref 36.0–46.0)
Hemoglobin: 9.2 g/dL — ABNORMAL LOW (ref 12.0–15.0)
MCH: 24.7 pg — ABNORMAL LOW (ref 26.0–34.0)
MCHC: 32.3 g/dL (ref 30.0–36.0)
MCV: 76.4 fL — ABNORMAL LOW (ref 78.0–100.0)
Platelets: 348 10*3/uL (ref 150–400)
RBC: 3.73 MIL/uL — ABNORMAL LOW (ref 3.87–5.11)
RDW: 23.1 % — ABNORMAL HIGH (ref 11.5–15.5)
WBC: 22.7 10*3/uL — ABNORMAL HIGH (ref 4.0–10.5)

## 2017-02-16 LAB — GLUCOSE, CAPILLARY
GLUCOSE-CAPILLARY: 154 mg/dL — AB (ref 65–99)
GLUCOSE-CAPILLARY: 180 mg/dL — AB (ref 65–99)
GLUCOSE-CAPILLARY: 152 mg/dL — AB (ref 65–99)
Glucose-Capillary: 133 mg/dL — ABNORMAL HIGH (ref 65–99)
Glucose-Capillary: 135 mg/dL — ABNORMAL HIGH (ref 65–99)

## 2017-02-16 LAB — BASIC METABOLIC PANEL
Anion gap: 10 (ref 5–15)
BUN: 65 mg/dL — ABNORMAL HIGH (ref 6–20)
CO2: 26 mmol/L (ref 22–32)
Calcium: 8.4 mg/dL — ABNORMAL LOW (ref 8.9–10.3)
Chloride: 108 mmol/L (ref 101–111)
Creatinine, Ser: 3.12 mg/dL — ABNORMAL HIGH (ref 0.44–1.00)
GFR calc Af Amer: 15 mL/min — ABNORMAL LOW (ref >60)
GFR calc non Af Amer: 13 mL/min — ABNORMAL LOW (ref >60)
Glucose, Bld: 206 mg/dL — ABNORMAL HIGH (ref 65–99)
Potassium: 2.9 mmol/L — ABNORMAL LOW (ref 3.5–5.1)
Sodium: 144 mmol/L (ref 135–145)

## 2017-02-16 LAB — COOXEMETRY PANEL
CARBOXYHEMOGLOBIN: 1.3 % (ref 0.5–1.5)
METHEMOGLOBIN: 1.2 % (ref 0.0–1.5)
O2 SAT: 62.9 %
Total hemoglobin: 9.4 g/dL — ABNORMAL LOW (ref 12.0–16.0)

## 2017-02-16 LAB — PROTIME-INR
INR: 3.06
Prothrombin Time: 32.3 seconds — ABNORMAL HIGH (ref 11.4–15.2)

## 2017-02-16 MED ORDER — MAGIC MOUTHWASH
10.0000 mL | Freq: Four times a day (QID) | ORAL | Status: DC
Start: 2017-02-16 — End: 2017-02-20
  Administered 2017-02-16 – 2017-02-19 (×12): 10 mL via ORAL
  Filled 2017-02-16 (×16): qty 10

## 2017-02-16 MED ORDER — POTASSIUM CHLORIDE CRYS ER 20 MEQ PO TBCR
40.0000 meq | EXTENDED_RELEASE_TABLET | Freq: Once | ORAL | Status: AC
Start: 2017-02-16 — End: 2017-02-16
  Administered 2017-02-16: 40 meq via ORAL
  Filled 2017-02-16: qty 2

## 2017-02-16 MED ORDER — WARFARIN SODIUM 1 MG PO TABS
1.0000 mg | ORAL_TABLET | Freq: Once | ORAL | Status: AC
  Administered 2017-02-16: 1 mg via ORAL
  Filled 2017-02-16: qty 1

## 2017-02-16 MED ORDER — MEROPENEM 500 MG IV SOLR
500.0000 mg | Freq: Two times a day (BID) | INTRAVENOUS | Status: DC
  Administered 2017-02-16 – 2017-02-22 (×13): 500 mg via INTRAVENOUS
  Filled 2017-02-16 (×15): qty 0.5

## 2017-02-16 MED ORDER — SODIUM CHLORIDE 0.9 % IV SOLN
30.0000 meq | Freq: Once | INTRAVENOUS | Status: AC
  Administered 2017-02-16: 30 meq via INTRAVENOUS
  Filled 2017-02-16: qty 15

## 2017-02-16 NOTE — Progress Notes (Signed)
Pharmacy Antibiotic Note  [name] [name] is a 82 y.o. female admitted on 02/03/2017 with pneumonia. Patient with strep pneumo bronchitis on vanc/zosyn however not improving so vanc/zosyn dc'd and pharmacy has been consulted for meropenem dosing  No fevers noted, however WBC remains elevated. SCr a bit down to 3.12, CrCl ~14 ml/min - not on HD  Plan: Reduce meropenem to 500 mg q 12 hrs based on renal function. Monitor clinical picture, WBC, culture data, Tmax Follow up renal function   Height: 5\' 6"  (167.6 cm) Weight: 182 lb 8.7 oz (82.8 kg) IBW/kg (Calculated) : 59.3  Temp (24hrs), Avg:98.4 F (36.9 C), Min:98.1 F (36.7 C), Max:98.9 F (37.2 C)   Recent Labs Lab 02/12/17 0402 02/13/17 0352 02/14/17 0413 02/15/17 0420 02/16/17 0415  WBC 21.2* 20.7* 20.9* 22.9* 22.7*  CREATININE 2.64* 2.84* 3.19* 3.45* 3.12*    Estimated Creatinine Clearance: 15.9 mL/min (by C-G formula based on SCr of 3.12 mg/dL (H)).    No Known Allergies  Antimicrobials this admission:  CTX 2/17 >>2/22 Vanc 2/22>>2/25 Zosyn 2/22>>2/25 Meropenem 2/25>>  Dose adjustments this admission:  2/23 zosyn down to 3.375 q12  Microbiology results:  2/22 BCx: ngtd 2/22 Urine:ng 2/17 UCx: neg 2/15 TA: strep pneumo S rocephin 2/14 MRSA PCR: neg  Antimicrobials this admission:  CTX 2/17 >>2/22 Vanc 2/22>>2/25 Zosyn 2/22>>2/25 Meropenem 2/25>>  Dose adjustments this admission:  2/23 zosyn down to 3.375 q12 2/26 meropenem down to 500 q 12hr  Microbiology results:  2/22 BCx: ngtd 2/22 Urine:ng 2/17 UCx: neg 2/15 TA: strep pneumo S rocephin 2/14 MRSA PCR: neg  Thank you for allowing pharmacy to be a part of this patient's care.  Uvaldo Rising, BCPS  Clinical Pharmacist Pager [phone]  02/16/2017 1:57 PM

## 2017-02-16 NOTE — Plan of Care (Addendum)
Problem: Bowel/Gastric: Goal: Will not experience complications related to bowel motility Outcome: Progressing Bowels working postop  Problem: Cardiac: Goal: Ability to maintain an adequate cardiac output will improve Outcome: progressing Coox stable off Mil  Problem: Nutritional: Goal: Risk for body nutrition deficit will decrease Outcome: Progressing Pt on dysphagia diet, good intact  Problem: Urinary Elimination: Goal: Ability to achieve and maintain adequate renal perfusion and functioning will improve Outcome: Progressing Cr stabilizing, still third spacing, pt with good urinary output, issue being actively treated

## 2017-02-16 NOTE — Progress Notes (Signed)
[facility] BRIEF SICU PROGRESS NOTE  11 Days Post-Op  S/P Procedure(s) (LRB): CORONARY ARTERY BYPASS GRAFTING (CABG) x 4                                                                               LIMA-LAD SEQ SVG-DIAG-OM SVG-PD (N/A) TRANSESOPHAGEAL ECHOCARDIOGRAM (TEE) (N/A) MITRAL VALVE REPLACEMENT (MVR) WITH MAGNA MITRAL EASE PERICARDIAL BIOPROSTHESIS 25 MM (N/A)   Stable day  Plan: Continue current plan  Rexene Alberts, MD 02/16/2017 7:56 PM

## 2017-02-16 NOTE — Progress Notes (Signed)
Physical Therapy Treatment Patient Details Name: [name] [name] MRN: [id number] DOB: 1935-11-06 Today's Date: 02/16/2017    History of Present Illness 82 y.o. female S/p emergent CABG x 4 and bioprosthetic MVR for LCX NSTEMI with flail MV on 2/15. PMH significant for DM and HTN.    PT Comments    Pt progressing with gait and all mobility with cognitive clarity this session. Pt able to provide PLOF, state daughter is caring for spouse and that pt was independent PTA. Pt able to recall 2/5 precautions and educated for all as well as mobility, transfers and HEP. Will continue to follow to maximize independence. Pt able to remain on RA throughout all mobility with saturation 94-96%, HR 97 with gait.    Follow Up Recommendations  SNF;Supervision for mobility/OOB     Equipment Recommendations  Rolling walker with 5" wheels    Recommendations for Other Services       Precautions / Restrictions Precautions Precautions: Sternal;Fall Precaution Comments: reviewed sternal precautions with pt able to recall 2/5    Mobility  Bed Mobility Overal bed mobility: Needs Assistance Bed Mobility: Supine to Sit;Sit to Supine     Supine to sit: Min assist Sit to supine: Min assist   General bed mobility comments: cues for sequence with assist to bring legs onto surface, guarding and increased time to roll and rise from surface  Transfers Overall transfer level: Needs assistance   Transfers: Sit to/from Stand Sit to Stand: Min guard         General transfer comment: cues for hands on thighs and safety with precautions  Ambulation/Gait Ambulation/Gait assistance: Min guard;+2 safety/equipment Ambulation Distance (Feet): 150 Feet Assistive device: Rolling walker (2 wheeled) Gait Pattern/deviations: Step-through pattern;Decreased stride length   Gait velocity interpretation: Below normal speed for age/gender General Gait Details: pt with cues for safety with RW and position in  RW   Stairs            Wheelchair Mobility    Modified Rankin (Stroke Patients Only)       Balance Overall balance assessment: Needs assistance   Sitting balance-Leahy Scale: Good       Standing balance-Leahy Scale: Good                      Cognition Arousal/Alertness: Awake/alert Behavior During Therapy: WFL for tasks assessed/performed Overall Cognitive Status: Impaired/Different from baseline Area of Impairment: Memory     Memory: Decreased recall of precautions              Exercises General Exercises - Lower Extremity Long Arc Quad: AROM;Both;Seated;15 reps Hip Flexion/Marching: AROM;Both;Seated;15 reps    General Comments        Pertinent Vitals/Pain Pain Assessment: No/denies pain    Home Living                      Prior Function            PT Goals (current goals can now be found in the care plan section) Progress towards PT goals: Progressing toward goals    Frequency           PT Plan Current plan remains appropriate    Co-evaluation             End of Session Equipment Utilized During Treatment: Gait belt Activity Tolerance: Patient tolerated treatment well Patient left: in bed;with call bell/phone within reach Nurse Communication: Mobility status PT Visit Diagnosis: Difficulty in walking, not  elsewhere classified (R26.2)     Time: 0034-9611 PT Time Calculation (min) (ACUTE ONLY): 20 min  Charges:  $Gait Training: 8-22 mins                    G Codes:       Tamira Ryland B Epimenio Schetter 03/08/17, 10:57 AM  Elwyn Reach, [place]

## 2017-02-16 NOTE — Progress Notes (Addendum)
Patient ID: [name] [name], female   DOB: Nov 04, 1935, 82 y.o.   MRN: [id number]     Advanced Heart Failure Rounding Note  PCP:  Primary Cardiologist:   Subjective:    S/p emergent CABG x 4 and bioprosthetic MVR for LCX NSTEMI with flail MV on 2/15  Had repeat episode of AF with RVR yesterday. IV amio restarted. Back in NSR.   Remains weak but some improvement. On dopamine 2.5. CVP now in 7-8 range off diuretics. Co-ox 63% Creatinine improving.   Abx switch to meropenem. Cx remain negative. Afebrile. Throat sore. Walked this am already.    Echo with EF 40% and stable MVR  In NSR on IV amio  Objective:   Weight Range: 82.8 kg (182 lb 8.7 oz) Body mass index is 29.46 kg/m.   Vital Signs:   Temp:  [98.1 F (36.7 C)-98.9 F (37.2 C)] 98.9 F (37.2 C) (02/26 0700) Pulse Rate:  [84-96] 87 (02/26 0600) Resp:  [14-27] 15 (02/26 0600) BP: (104-137)/(57-81) 126/74 (02/26 0600) SpO2:  [96 %-100 %] 98 % (02/26 0600) Weight:  [82.8 kg (182 lb 8.7 oz)] 82.8 kg (182 lb 8.7 oz) (02/26 0437) Last BM Date: 02/15/17  Weight change: Filed Weights   02/14/17 0500 02/15/17 0500 02/16/17 0437  Weight: 82.1 kg (181 lb) 82 kg (180 lb 12.4 oz) 82.8 kg (182 lb 8.7 oz)    Intake/Output:   Intake/Output Summary (Last 24 hours) at 02/16/17 0848 Last data filed at 02/16/17 8341  Gross per 24 hour  Intake           2918.7 ml  Output             1500 ml  Net           1418.7 ml     Physical Exam: General:  Sitting in chair. NAD. Weak. Hoarse voice HEENT: normal Neck: supple.  Carotids: JVP 7-8. 2+ bilat; no bruits. No lymphadenopathy or thyromegaly appreciated. Cor: PMI nondisplaced. irregular. No M/G/R noted.   Lungs: Decreased in the bases otherwise clear.  Abdomen: soft, ND, no HSM. No bruits or masses. +BS  Extremities: no cyanosis, clubbing, rash. RUE PICC. RLE LLE 1+ edema. .  Neuro: A&Ox3 follows commands and is oriented. Moves all 4  Telemetry: Sinus Rhythm 80s frequent PACs  Personally reviewed  Labs: CBC  Recent Labs  02/15/17 0420 02/16/17 0415  WBC 22.9* 22.7*  HGB 9.7* 9.2*  HCT 30.3* 28.5*  MCV 77.5* 76.4*  PLT 337 962   Basic Metabolic Panel  Recent Labs  02/15/17 0420 02/16/17 0415  NA 145 144  K 3.0* 2.9*  CL 106 108  CO2 26 26  GLUCOSE 182* 206*  BUN 72* 65*  CREATININE 3.45* 3.12*  CALCIUM 8.6* 8.4*   Liver Function Tests No results for input(s): AST, ALT, ALKPHOS, BILITOT, PROT, ALBUMIN in the last 72 hours. No results for input(s): LIPASE, AMYLASE in the last 72 hours. Cardiac Enzymes No results for input(s): CKTOTAL, CKMB, CKMBINDEX, TROPONINI in the last 72 hours.  BNP: BNP (last 3 results) No results for input(s): BNP in the last 8760 hours.  ProBNP (last 3 results) No results for input(s): PROBNP in the last 8760 hours.   D-Dimer No results for input(s): DDIMER in the last 72 hours. Hemoglobin A1C No results for input(s): HGBA1C in the last 72 hours. Fasting Lipid Panel No results for input(s): CHOL, HDL, LDLCALC, TRIG, CHOLHDL, LDLDIRECT in the last 72 hours. Thyroid Function Tests No results  for input(s): TSH, T4TOTAL, T3FREE, THYROIDAB in the last 72 hours.  Invalid input(s): FREET3  Other results:     Imaging/Studies:  Dg Chest Port 1 View  Result Date: 02/16/2017 CLINICAL DATA:  Status post CABG and mitral valve repair on February 05, 2017. EXAM: PORTABLE CHEST 1 VIEW COMPARISON:  Portable chest x-ray of February 15, 2017 FINDINGS: The lungs are reasonably well inflated. The interstitial markings are more conspicuous today. The hemidiaphragms are less well demonstrated in the retrocardiac region is more dense. The cardiac silhouette remains enlarged. The pulmonary vascularity is more engorged and less distinct. The sternal wires are intact. The PICC line tip projects over the midportion of the SVC. The prosthetic mitral valve ring is in stable position. The sternal wires are intact. There is  calcification in the wall of the aortic arch. IMPRESSION: Worsening of CHF manifested by increased pulmonary interstitial edema and bilateral pleural effusions. No pneumothorax . Persistent left lower lobe atelectasis. Electronically Signed   By: David  Martinique M.D.   On: 02/16/2017 07:09      Medications:     Scheduled Medications: . sodium chloride   Intravenous Once  . aspirin  81 mg Oral Daily  . atorvastatin  80 mg Oral q1800  . chlorhexidine  15 mL Mouth Rinse BID  . Chlorhexidine Gluconate Cloth  6 each Topical Daily  . insulin aspart  0-15 Units Subcutaneous Q4H  . magic mouthwash  10 mL Oral QID  . mouth rinse  15 mL Mouth Rinse q12n4p  . meropenem (MERREM) IV  1 g Intravenous Q12H  . pantoprazole (PROTONIX) IV  40 mg Intravenous Q24H  . potassium chloride (KCL MULTIRUN) 30 mEq in 265 mL IVPB  30 mEq Intravenous Once  . sodium chloride flush  10-40 mL Intracatheter Q12H  . sodium chloride flush  3 mL Intravenous Q12H  . warfarin  1 mg Oral ONCE-1800  . Warfarin - Physician Dosing Inpatient   Does not apply q1800    Infusions: . sodium chloride    . amiodarone 30 mg/hr (02/16/17 0326)  . dextrose 5 % and 0.45% NaCl 50 mL/hr at 02/15/17 1656  . DOPamine 2.5 mcg/kg/min (02/15/17 1800)  . lactated ringers Stopped (02/06/17 0100)  . lactated ringers 10 mL/hr at 02/14/17 2000    PRN Medications: hydrALAZINE, ondansetron (ZOFRAN) IV, RESOURCE THICKENUP CLEAR, sodium chloride flush, sodium chloride flush   Assessment/Plan   1. CAD [name] on 2/15 2. Severe MR with flail segment s/p MVR c/ bioprosthetic valve. 3. Cardiogenic shock: Echo pre-op with EF 40-45%, lateral akinesis, severe MR.     --Echo 2/22 EF 40% MVR ok 4. AKI 5. Shock Liver 6. Acute respiratory failure with hypoxemia requiring intubation 7. Thrombocytopenia: Post-op. 8. Tracheal aspirate with Strep pneumoniae 9. PAF 10. Leukocytosis 11. Hypokalemia  Making slow progress. Milrinone now off.  Co-ox ok. Now off diuretics. CVP improved to 7-8. Will leave off diuretics today and as creatinine continues to improve can add back low-dose po torsemide.   WBC was increasing despite van/zosyn. Cultures negative except for previous strep pneumo in sputum.. Will continue meropenem. Can add andulafungin for fungal culture as needed.   Maintaining NSR on IV amio. Had breakthrough last night. Will continue IV amio one more day.  On warfarin for MV and PAF. INR 3.1. Discussed with PharmD  Supp K.   Continue to mobilize.   Avanti Jetter,MD 8:48 AM

## 2017-02-16 NOTE — Progress Notes (Signed)
  Speech Language Pathology Treatment: Dysphagia  Patient Details Name: [name] [name] MRN: [id number] DOB: 15-Aug-1935 Today's Date: 02/16/2017 Time: 8882-8003 SLP Time Calculation (min) (ACUTE ONLY): 15 min  Assessment / Plan / Recommendation Clinical Impression  Pt continues to demonstrate s/s of aspiration with consumption of nectar thick liquids; honey thick liquids tolerated better.  Pt alert, communicative, enjoyed trials of advanced solids without significant oral delays.  Recommend advancing diet to dysphagia 2 pending repeat MBS next date.  D/W RN.    HPI HPI: [name] is a 82 year old woman with history of type 2 diabetes mellitus and hypertension, who presented to cardiology clinic due to acute chest pain that began 3 days ago while doing light housework. EKG showed abnormalities concerning for ischemia. Urgent cardiac cath revealed three-vessel CAD, including serial 70 and 80% proximal/mid LAD stenoses. Underwent CABG x 4 2/15 and extubated 2/19. CXR No pneumothorax or pulmonary edema. Left lung base atelectasis and possible small pleural effusion.      SLP Plan  Continue with current plan of care       Recommendations  Diet recommendations: Dysphagia 2 (fine chop);Honey-thick liquid Liquids provided via: Cup;Teaspoon Medication Administration: Crushed with puree Supervision: Patient able to self feed;Full supervision/cueing for compensatory strategies Compensations: Slow rate;Small sips/bites;Multiple dry swallows after each bite/sip Postural Changes and/or Swallow Maneuvers: Seated upright 90 degrees                Oral Care Recommendations: Oral care BID Follow up Recommendations: Skilled Nursing facility SLP Visit Diagnosis: Dysphagia, oral phase (R13.11);Dysphagia, oropharyngeal phase (R13.12) Plan: Continue with current plan of care       GO                [name] [name] 02/16/2017, 11:47 AM

## 2017-02-16 NOTE — Care Management Note (Addendum)
Case Management Note  Patient Details  Name: [name] [name] MRN: [id number] Date of Birth: [birth year]  Subjective/Objective:    Pt lives with and is primary caregiver for husband who has dementia and is wheelchair bound.  Pt will need ST-SNF for rehab, requests [facility], CSW is following.      Addendum 3/9 Carles Collet RNCM Patient has bed at [name] where husband currently is per CSW. Milrinone Gtt (plan for SNF), per bedside RN pleural effusion worsening and pt may require pleurocentesis, continues IV Lasix.                      Expected Discharge Plan:  Skilled Nursing Facility  In-House Referral:  Clinical Social Work  Discharge planning Services  CM Consult  Status of Service:  In process, will continue to follow  Girard Cooter, RN 02/16/2017, 9:47 AM

## 2017-02-16 NOTE — Progress Notes (Signed)
11 Days Post-Op Procedure(s) (LRB): CORONARY ARTERY BYPASS GRAFTING (CABG) x 4                                                                               LIMA-LAD SEQ SVG-DIAG-OM SVG-PD (N/A) TRANSESOPHAGEAL ECHOCARDIOGRAM (TEE) (N/A) MITRAL VALVE REPLACEMENT (MVR) WITH MAGNA MITRAL EASE PERICARDIAL BIOPROSTHESIS 25 MM (N/A) Subjective:  Feels ok. Ambulating fairly well. Eating some. Bowels working. Only complaint is of scratchy, sore throat.  Objective: Vital signs in last 24 hours: Temp:  [98 F (36.7 C)-98.5 F (36.9 C)] 98.1 F (36.7 C) (02/26 0400) Pulse Rate:  [84-96] 87 (02/26 0600) Cardiac Rhythm: Normal sinus rhythm (02/26 0400) Resp:  [14-27] 15 (02/26 0600) BP: (104-137)/(57-81) 126/74 (02/26 0600) SpO2:  [96 %-100 %] 98 % (02/26 0600) Weight:  [82.8 kg (182 lb 8.7 oz)] 82.8 kg (182 lb 8.7 oz) (02/26 0437)  Hemodynamic parameters for last 24 hours: CVP:  [8 mmHg-10 mmHg] 9 mmHg  Intake/Output from previous day: 02/25 0701 - 02/26 0700 In: 2982.5 [P.O.:700; I.V.:1817.5; IV Piggyback:465] Out: 1500 [Urine:1500] Intake/Output this shift: No intake/output data recorded.  General appearance: alert and cooperative Some yeast on tongue. Neurologic: intact Heart: regular rate and rhythm, S1, S2 normal, no murmur, click, rub or gallop Lungs: diminished breath sounds bibasilar Extremities:  edema miild Wound: incisions healing well  Lab Results:  Recent Labs  02/15/17 0420 02/16/17 0415  WBC 22.9* 22.7*  HGB 9.7* 9.2*  HCT 30.3* 28.5*  PLT 337 348   BMET:  Recent Labs  02/15/17 0420 02/16/17 0415  NA 145 144  K 3.0* 2.9*  CL 106 108  CO2 26 26  GLUCOSE 182* 206*  BUN 72* 65*  CREATININE 3.45* 3.12*  CALCIUM 8.6* 8.4*    PT/INR:  Recent Labs  02/16/17 0415  LABPROT 32.3*  INR 3.06   ABG    Component Value Date/Time   PHART 7.377 02/09/2017 1603   HCO3 23.0 02/09/2017 1603   TCO2 26 02/11/2017 1618   ACIDBASEDEF 2.0 02/09/2017 1603   O2SAT 62.9 02/16/2017 0400   CBG (last 3)   Recent Labs  02/15/17 1949 02/15/17 2333 02/16/17 0411  GLUCAP 98 112* 133*    Assessment/Plan: S/P Procedure(s) (LRB): CORONARY ARTERY BYPASS GRAFTING (CABG) x 4                                                                               LIMA-LAD SEQ SVG-DIAG-OM SVG-PD (N/A) TRANSESOPHAGEAL ECHOCARDIOGRAM (TEE) (N/A) MITRAL VALVE REPLACEMENT (MVR) WITH MAGNA MITRAL EASE PERICARDIAL BIOPROSTHESIS 25 MM (N/A)  She is hemodynamically stable on dop 2.5 with Co-ox 63% and CVP 7-9. She is total body volume overloaded but probably euvolemic and I would not diurese her until kidneys recover. Her last albumin was 2.6 so she is going to third space fluid and diuresis will not fix that.  Renal function is a little better  today and making adequate urine on her own.  Hypokalemia: gave her 30 meq this am.  Leukocytosis stable. Cause is unclear. BC and UC negative. No fever. Will add magic mouthwash for oral thrush. No diflucan with liver and renal dysfunction.  INR down to 3.0 so will give 1 mg coumadin today.  Encourage nutrition. Speech therapy following.  IS, ambulation   LOS: 13 days    Gaye Pollack 02/16/2017

## 2017-02-17 ENCOUNTER — Ambulatory Visit: Payer: Medicare Other | Admitting: Cardiology

## 2017-02-17 ENCOUNTER — Inpatient Hospital Stay (HOSPITAL_COMMUNITY): Payer: Medicare Other

## 2017-02-17 LAB — PROTIME-INR
INR: 2.79
PROTHROMBIN TIME: 30 s — AB (ref 11.4–15.2)

## 2017-02-17 LAB — CBC
HCT: 28.5 % — ABNORMAL LOW (ref 36.0–46.0)
HEMOGLOBIN: 9.2 g/dL — AB (ref 12.0–15.0)
MCH: 24.6 pg — ABNORMAL LOW (ref 26.0–34.0)
MCHC: 32.3 g/dL (ref 30.0–36.0)
MCV: 76.2 fL — AB (ref 78.0–100.0)
PLATELETS: 380 10*3/uL (ref 150–400)
RBC: 3.74 MIL/uL — AB (ref 3.87–5.11)
RDW: 23.2 % — ABNORMAL HIGH (ref 11.5–15.5)
WBC: 19.4 10*3/uL — AB (ref 4.0–10.5)

## 2017-02-17 LAB — CULTURE, BLOOD (ROUTINE X 2)
Culture: NO GROWTH
Culture: NO GROWTH

## 2017-02-17 LAB — BASIC METABOLIC PANEL
ANION GAP: 11 (ref 5–15)
Anion gap: 9 (ref 5–15)
BUN: 49 mg/dL — ABNORMAL HIGH (ref 6–20)
BUN: 43 mg/dL — ABNORMAL HIGH (ref 6–20)
CALCIUM: 8.6 mg/dL — AB (ref 8.9–10.3)
CALCIUM: 8.8 mg/dL — AB (ref 8.9–10.3)
CHLORIDE: 109 mmol/L (ref 101–111)
CO2: 26 mmol/L (ref 22–32)
CO2: 28 mmol/L (ref 22–32)
CREATININE: 2.48 mg/dL — AB (ref 0.44–1.00)
Chloride: 110 mmol/L (ref 101–111)
Creatinine, Ser: 2.25 mg/dL — ABNORMAL HIGH (ref 0.44–1.00)
GFR calc Af Amer: 23 mL/min — ABNORMAL LOW (ref >60)
GFR calc non Af Amer: 17 mL/min — ABNORMAL LOW (ref >60)
GFR, EST AFRICAN AMERICAN: 20 mL/min — AB (ref >60)
GFR, EST NON AFRICAN AMERICAN: 20 mL/min — AB (ref >60)
Glucose, Bld: 147 mg/dL — ABNORMAL HIGH (ref 65–99)
Glucose, Bld: 188 mg/dL — ABNORMAL HIGH (ref 65–99)
POTASSIUM: 3.2 mmol/L — AB (ref 3.5–5.1)
Potassium: 3 mmol/L — ABNORMAL LOW (ref 3.5–5.1)
SODIUM: 146 mmol/L — AB (ref 135–145)
Sodium: 147 mmol/L — ABNORMAL HIGH (ref 135–145)

## 2017-02-17 LAB — GLUCOSE, CAPILLARY
GLUCOSE-CAPILLARY: 113 mg/dL — AB (ref 65–99)
GLUCOSE-CAPILLARY: 140 mg/dL — AB (ref 65–99)
GLUCOSE-CAPILLARY: 198 mg/dL — AB (ref 65–99)
GLUCOSE-CAPILLARY: 93 mg/dL (ref 65–99)
Glucose-Capillary: 141 mg/dL — ABNORMAL HIGH (ref 65–99)
Glucose-Capillary: 126 mg/dL — ABNORMAL HIGH (ref 65–99)
Glucose-Capillary: 136 mg/dL — ABNORMAL HIGH (ref 65–99)

## 2017-02-17 LAB — COOXEMETRY PANEL
Carboxyhemoglobin: 1.7 % — ABNORMAL HIGH (ref 0.5–1.5)
Methemoglobin: 0.8 % (ref 0.0–1.5)
O2 SAT: 60.5 %
Total hemoglobin: 9.3 g/dL — ABNORMAL LOW (ref 12.0–16.0)

## 2017-02-17 MED ORDER — SODIUM CHLORIDE 0.9 % IV SOLN
30.0000 meq | Freq: Once | INTRAVENOUS | Status: AC
  Administered 2017-02-17: 30 meq via INTRAVENOUS
  Filled 2017-02-17: qty 15

## 2017-02-17 MED ORDER — AMIODARONE HCL 200 MG PO TABS
200.0000 mg | ORAL_TABLET | Freq: Two times a day (BID) | ORAL | Status: DC
  Administered 2017-02-17: 200 mg via ORAL
  Filled 2017-02-17: qty 1

## 2017-02-17 MED ORDER — POTASSIUM CHLORIDE CRYS ER 20 MEQ PO TBCR
40.0000 meq | EXTENDED_RELEASE_TABLET | Freq: Once | ORAL | Status: AC
  Administered 2017-02-17: 40 meq via ORAL
  Filled 2017-02-17: qty 2

## 2017-02-17 MED ORDER — TORSEMIDE 20 MG PO TABS
40.0000 mg | ORAL_TABLET | Freq: Every day | ORAL | Status: DC
  Administered 2017-02-17: 40 mg via ORAL
  Filled 2017-02-17: qty 2

## 2017-02-17 MED ORDER — POTASSIUM CHLORIDE 2 MEQ/ML IV SOLN
30.0000 meq | Freq: Once | INTRAVENOUS | Status: AC
  Administered 2017-02-17: 30 meq via INTRAVENOUS
  Filled 2017-02-17: qty 15

## 2017-02-17 MED ORDER — WARFARIN SODIUM 2.5 MG PO TABS
2.5000 mg | ORAL_TABLET | Freq: Once | ORAL | Status: AC
  Administered 2017-02-17: 2.5 mg via ORAL
  Filled 2017-02-17: qty 1

## 2017-02-17 MED ORDER — AMIODARONE HCL 200 MG PO TABS
400.0000 mg | ORAL_TABLET | Freq: Two times a day (BID) | ORAL | Status: DC
  Administered 2017-02-17 – 2017-02-21 (×8): 400 mg via ORAL
  Filled 2017-02-17 (×8): qty 2

## 2017-02-17 NOTE — Progress Notes (Signed)
Modified Barium Swallow Progress Note  Patient Details  Name: [name] [name] MRN: [id number] Date of Birth: Oct 21, 1935  Today's Date: 02/17/2017  Modified Barium Swallow completed.  Full report located under Chart Review in the Imaging Section.  Brief recommendations include the following:  Clinical Impression  [name] demonstrated a mild oral and moderate sensorimotor pharyngeal dysphagia. Oral phase impairments included lingual/palatal residue, delayed oral transit, and holding (improved orally since previous MBS on 02/13/2017). Observed silent gross aspiration during the swallow with nectar thick liquids via cup due to decreased laryngeal closure. Skilled intervention included use of chin tuck technique with subsequent trials of nectar thick liquids resulting in silent penetration to the level of the vocal cords. Noted mild delayed swallow initiation to the valleculae with cup sips of honey thick liquids. Pt's alertness and oral phase impairments have improved, however, she is still at risk for aspiration with any liquid thinner than honey thick. Recommend upgrading solids from Dys 2 to Dys 3, continue honey thick liquids via cup/spoon (no straw), meds crushed. ST will f/u for treatment to assess diet tolerance and for possible initiation of pharyngeal strengthening exercises.   Swallow Evaluation Recommendations       SLP Diet Recommendations: Dysphagia 3 (Mech soft) solids;Honey thick liquids   Liquid Administration via: Cup;No straw;Spoon   Medication Administration: Crushed with puree   Supervision: Staff to assist with self feeding;Full supervision/cueing for compensatory strategies   Compensations: Slow rate;Small sips/bites;Multiple dry swallows after each bite/sip   Postural Changes: Seated upright at 90 degrees   Oral Care Recommendations: Oral care BID        Houston Siren 02/17/2017,2:34 PM   Orbie Pyo [place] [phone]

## 2017-02-17 NOTE — Progress Notes (Signed)
Patient ID: [name] [name], female   DOB: Feb 09, 1935, 82 y.o.   MRN: [id number]     Advanced Heart Failure Rounding Note  PCP:  Primary Cardiologist:   Subjective:    S/p emergent CABG x 4 and bioprosthetic MVR for LCX NSTEMI with flail MV on 2/15  Had repeat episode of AF with RVR 02/15/17. IV amio restarted. Back in NSR.   Remains weak but slowly improving.  Legs somewhat swollen. Coox 60.5% on dopamine 2.5. Throat remains sore. Has walked the halls a couple of times.   Abx switch to meropenem. Cx remain negative. Afebrile. WBC now dropping to 19k  Echo with EF 40% and stable MVR  In NSR on IV amio  Objective:   Weight Range: 184 lb 11.9 oz (83.8 kg) Body mass index is 29.82 kg/m.   Vital Signs:   Temp:  [97.8 F (36.6 C)-98.8 F (37.1 C)] 98.3 F (36.8 C) (02/27 0756) Pulse Rate:  [85-100] 88 (02/27 0700) Resp:  [17-29] 20 (02/27 0700) BP: (122-159)/(66-90) 139/73 (02/27 0700) SpO2:  [92 %-100 %] 96 % (02/27 0700) Weight:  [184 lb 11.9 oz (83.8 kg)] 184 lb 11.9 oz (83.8 kg) (02/27 0445) Last BM Date: 02/16/17  Weight change: Filed Weights   02/15/17 0500 02/16/17 0437 02/17/17 0445  Weight: 180 lb 12.4 oz (82 kg) 182 lb 8.7 oz (82.8 kg) 184 lb 11.9 oz (83.8 kg)    Intake/Output:   Intake/Output Summary (Last 24 hours) at 02/17/17 0925 Last data filed at 02/17/17 0700  Gross per 24 hour  Intake             2296 ml  Output              700 ml  Net             1596 ml     Physical Exam: General:  Seated in chair. NAD. Weak. Hoarse.  HEENT: Normal Neck: Supple Carotids: JVP 7-8. 2+ bilat; no bruits. No lymphadenopathy or thyromegaly appreciated. Cor: PMI nondisplaced. irregular. No M/G/R noted.   Lungs: Diminished basilar sounds.   Abdomen: soft, ND, no HSM. No bruits or masses. +BS  Extremities: no cyanosis, clubbing, rash. RUE PICC. BLE 1-2+ edema.  Neuro: A&Ox3 follows commands and is oriented. Moves all 4  Telemetry: Personally reviewed,  NSR  Labs: CBC  Recent Labs  02/16/17 0415 02/17/17 0415  WBC 22.7* 19.4*  HGB 9.2* 9.2*  HCT 28.5* 28.5*  MCV 76.4* 76.2*  PLT 348 778   Basic Metabolic Panel  Recent Labs  02/16/17 0415 02/17/17 0415  NA 144 146*  K 2.9* 3.0*  CL 108 109  CO2 26 26  GLUCOSE 206* 147*  BUN 65* 49*  CREATININE 3.12* 2.48*  CALCIUM 8.4* 8.6*   Liver Function Tests No results for input(s): AST, ALT, ALKPHOS, BILITOT, PROT, ALBUMIN in the last 72 hours. No results for input(s): LIPASE, AMYLASE in the last 72 hours. Cardiac Enzymes No results for input(s): CKTOTAL, CKMB, CKMBINDEX, TROPONINI in the last 72 hours.  BNP: BNP (last 3 results) No results for input(s): BNP in the last 8760 hours.  ProBNP (last 3 results) No results for input(s): PROBNP in the last 8760 hours.   D-Dimer No results for input(s): DDIMER in the last 72 hours. Hemoglobin A1C No results for input(s): HGBA1C in the last 72 hours. Fasting Lipid Panel No results for input(s): CHOL, HDL, LDLCALC, TRIG, CHOLHDL, LDLDIRECT in the last 72 hours. Thyroid Function Tests No  results for input(s): TSH, T4TOTAL, T3FREE, THYROIDAB in the last 72 hours.  Invalid input(s): FREET3  Other results:     Imaging/Studies:  No results found.    Medications:     Scheduled Medications: . amiodarone  200 mg Oral BID  . aspirin  81 mg Oral Daily  . atorvastatin  80 mg Oral q1800  . chlorhexidine  15 mL Mouth Rinse BID  . Chlorhexidine Gluconate Cloth  6 each Topical Daily  . insulin aspart  0-15 Units Subcutaneous Q4H  . magic mouthwash  10 mL Oral QID  . mouth rinse  15 mL Mouth Rinse q12n4p  . meropenem (MERREM) IV  500 mg Intravenous Q12H  . pantoprazole (PROTONIX) IV  40 mg Intravenous Q24H  . sodium chloride flush  10-40 mL Intracatheter Q12H  . sodium chloride flush  3 mL Intravenous Q12H  . torsemide  40 mg Oral Daily  . warfarin  2.5 mg Oral ONCE-1800  . Warfarin - Physician Dosing Inpatient   Does  not apply q1800    Infusions: . sodium chloride    . dextrose 5 % and 0.45% NaCl 10 mL/hr at 02/17/17 0757  . DOPamine 2.5 mcg/kg/min (02/17/17 0400)    PRN Medications: hydrALAZINE, ondansetron (ZOFRAN) IV, RESOURCE THICKENUP CLEAR, sodium chloride flush, sodium chloride flush   Assessment/Plan   1. CAD [name] on 2/15 2. Severe MR with flail segment s/p MVR c/ bioprosthetic valve. 3. Cardiogenic shock: Echo pre-op with EF 40-45%, lateral akinesis, severe MR.     --Echo 2/22 EF 40% MVR ok 4. AKI 5. Shock Liver 6. Acute respiratory failure with hypoxemia requiring intubation 7. Thrombocytopenia: Post-op. 8. Tracheal aspirate with Strep pneumoniae 9. PAF 10. Leukocytosis 11. Hypokalemia  Coox stable off milrinone. Remains on dopamine.   Creatinine improving and looks mildly volume overloaded today on exam. Will carefully start on 40 mg torsemide daily. Supp K.   WBC down slightly but remain up at 19.4 Cultures negative except for previous strep pneumo in sputum.. Will continue meropenem. Can add andulafungin for fungal culture as needed.   Maintaining NSR on IV amio. Had breakthrough 02/15/17. Will discuss timing for IV amio cessation with MD.On warfarin for MV and PAF. INR 2.79   Mobilize as tolerated.    Shirley Friar, PA-C  9:25 AM  Advanced Heart Failure Team Pager [phone] (M-F; 7a - 4p)  Please contact [facility] for night-coverage after hours (4p -7a ) and weekends on [email or web address]    Patient seen and examined with Oda Kilts, PA-C. We discussed all aspects of the encounter. I agree with the assessment and plan as stated above.   Overall improving slowly. Remains on low-dose dopamine. Creatinine much improved. Will start low-dose po torsemide. Supp K. Place TED hose. Continue dopamine one more day.   Can switch to po amio for AF. Continue warfarin.   WBC finally decreasing on meropenem. Will continue.   Continue to mobilize. Likely has vocal  cord irritation due to ETT. Seems to be improving slowly.   Lucilla Petrenko,MD 10:08 AM

## 2017-02-17 NOTE — Progress Notes (Signed)
Nutrition Follow Up  DOCUMENTATION CODES:   Not applicable  INTERVENTION:    Continue Mighty Shake II TID with meals, each supplement provides 480-500 kcals and 20-23 grams of protein  NUTRITION DIAGNOSIS:   Inadequate oral intake related to dysphagia, poor appetite as evidenced by meal completion < 25%, ongoing  GOAL:   Patient will meet greater than or equal to 90% of their needs, unmet  MONITOR:   PO intake, Supplement acceptance, Labs, Weight trends, I & O's  ASSESSMENT:   82 yo Feamle with HTN and DM found to have significant three-vessel CAD in the setting of new chest pain with minimal exertion over the last 3 days consistent with unstable angina.   Pt s/p procedures 2/15: CABG, MITRAL VALVE REPAIR  Pt continues on a Dys 2-honey thick liquid diet. PO intake very poor at 10-15% per flowsheets. For repeat MBSS per Speech Path today. Labs and medications reviewed. CBG's [id number].  Diet Order:  DIET DYS 2 Room service appropriate? Yes; Fluid consistency: Honey Thick  Skin:  Reviewed, no issues  Last BM:  2/26  Height:   Ht Readings from Last 1 Encounters:  02/05/17 5\' 6"  (1.676 m)    Weight:   Wt Readings from Last 1 Encounters:  02/17/17 184 lb 11.9 oz (83.8 kg)    Ideal Body Weight:  59 kg  BMI:  Body mass index is 29.82 kg/m.  Estimated Nutritional Needs:   Kcal:  1800-2000  Protein:  95-105 gm  Fluid:  1.8-2.0 L  EDUCATION NEEDS:   No education needs identified at this time  Arthur Holms, RD, LDN Pager #: [phone] After-Hours Pager #: [phone]

## 2017-02-17 NOTE — Progress Notes (Signed)
Physical Therapy Treatment Patient Details Name: [name] [name] MRN: [id number] DOB: November 17, 1935 Today's Date: 02/17/2017    History of Present Illness 82 y.o. female S/p emergent CABG x 4 and bioprosthetic MVR for LCX NSTEMI with flail MV on 2/15. PMH significant for DM and HTN.    PT Comments    Pt progressing well toward goals with decreased assist required for mobility. Pt educated on importance of movement and instructed in HEP to reduce LE edema. Current d/c plan remains appropriate when pt medically ready. PT will continue to follow.    sats >94% on 2L O2   Follow Up Recommendations  SNF;Supervision for mobility/OOB     Equipment Recommendations  Rolling walker with 5" wheels    Recommendations for Other Services       Precautions / Restrictions Precautions Precautions: Sternal;Fall Precaution Comments: reviewed sternal precautions Restrictions Weight Bearing Restrictions: Yes Other Position/Activity Restrictions: sternal precautions    Mobility  Bed Mobility               General bed mobility comments: seated in chair upon PT arrival  Transfers Overall transfer level: Needs assistance Equipment used: Rolling walker (2 wheeled) Transfers: Sit to/from Stand;Stand Pivot Transfers Sit to Stand: Min assist Stand pivot transfers: Min assist       General transfer comment: cues for safety with precautions and hands on thighs, rocking technique; stand pivot to/from [facility]  Ambulation/Gait Ambulation/Gait assistance: Min guard Ambulation Distance (Feet): 150 Feet Assistive device: Rolling walker (2 wheeled) Gait Pattern/deviations: Step-through pattern;Decreased step length - right;Decreased step length - left;Decreased stride length Gait velocity: decreased Gait velocity interpretation: Below normal speed for age/gender General Gait Details: cues for safe RW use and upright posture; 2 standing rest breaks <10 seconds duration each   Stairs             Wheelchair Mobility    Modified Rankin (Stroke Patients Only)       Balance Overall balance assessment: Needs assistance Sitting-balance support: Feet supported;No upper extremity supported Sitting balance-Leahy Scale: Good Sitting balance - Comments: sat edge of chair without LOB    Standing balance support: Bilateral upper extremity supported;No upper extremity supported Standing balance-Leahy Scale: Fair Standing balance comment: static standing x 1 min without UE support for BSC hygeine without LOB; unable to attempt steps without UE assist                    Cognition Arousal/Alertness: Awake/alert Behavior During Therapy: WFL for tasks assessed/performed Overall Cognitive Status: Impaired/Different from baseline Area of Impairment: Memory     Memory: Decreased recall of precautions         General Comments: only able to recall 2/5 sternal precautions    Exercises General Exercises - Lower Extremity Ankle Circles/Pumps: AROM;Both;20 reps;Seated Long Arc Quad: AROM;Both;10 reps;Seated    General Comments General comments (skin integrity, edema, etc.): slight B LE edema      Pertinent Vitals/Pain Pain Assessment: No/denies pain    Home Living                      Prior Function            PT Goals (current goals can now be found in the care plan section) Acute Rehab PT Goals Patient Stated Goal: get better Progress towards PT goals: Progressing toward goals    Frequency    Min 3X/week      PT Plan Current plan remains appropriate  Co-evaluation             End of Session Equipment Utilized During Treatment: Gait belt;Oxygen Activity Tolerance: Patient tolerated treatment well Patient left: in chair;with call bell/phone within reach Nurse Communication: Mobility status PT Visit Diagnosis: Unsteadiness on feet (R26.81);Muscle weakness (generalized) (M62.81);Difficulty in walking, not elsewhere classified (R26.2)      Time: 0752-0828 PT Time Calculation (min) (ACUTE ONLY): 36 min  Charges:  $Gait Training: 8-22 mins $Therapeutic Activity: 8-22 mins                    G Codes:       Tracie Harrier Mar 19, 2017, 9:53 AM  Tracie Harrier, SPT Acute Rehab SPT [phone]

## 2017-02-17 NOTE — Progress Notes (Signed)
CT surgery p.m. Rounds  Making progress after emergency CABG for ruptured papillary muscle, cardiogenic shock Patient name related hallway Back in sinus rhythm with amiodarone Currently back in bed resting after a busy day Continue current care

## 2017-02-17 NOTE — Progress Notes (Signed)
Daughter informed me that she is holding a bed at [facility] for her mother. Please follow up with her concerning plan of care. Thanks

## 2017-02-17 NOTE — Progress Notes (Signed)
12 Days Post-Op Procedure(s) (LRB): CORONARY ARTERY BYPASS GRAFTING (CABG) x 4                                                                               LIMA-LAD SEQ SVG-DIAG-OM SVG-PD (N/A) TRANSESOPHAGEAL ECHOCARDIOGRAM (TEE) (N/A) MITRAL VALVE REPLACEMENT (MVR) WITH MAGNA MITRAL EASE PERICARDIAL BIOPROSTHESIS 25 MM (N/A) Subjective:  No specific complaints. Ambulating well.  Objective: Vital signs in last 24 hours: Temp:  [97.8 F (36.6 C)-98.8 F (37.1 C)] 98.3 F (36.8 C) (02/27 0756) Pulse Rate:  [85-100] 88 (02/27 0700) Cardiac Rhythm: Normal sinus rhythm (02/26 2000) Resp:  [17-29] 20 (02/27 0700) BP: (122-159)/(66-90) 139/73 (02/27 0700) SpO2:  [92 %-100 %] 96 % (02/27 0700) Weight:  [83.8 kg (184 lb 11.9 oz)] 83.8 kg (184 lb 11.9 oz) (02/27 0445)  Hemodynamic parameters for last 24 hours: CVP:  [7 mmHg-11 mmHg] 11 mmHg  Intake/Output from previous day: 02/26 0701 - 02/27 0700 In: 2677 [P.O.:360; I.V.:1902; IV Piggyback:415] Out: 700 [Urine:700] Intake/Output this shift: No intake/output data recorded.  General appearance: alert and cooperative Neurologic: intact Heart: regular rate and rhythm, S1, S2 normal, no murmur, click, rub or gallop Lungs: diminished breath sounds bibasilar Extremities: edema moderate  Wound: incisions ok  Lab Results:  Recent Labs  02/16/17 0415 02/17/17 0415  WBC 22.7* 19.4*  HGB 9.2* 9.2*  HCT 28.5* 28.5*  PLT 348 380   BMET:  Recent Labs  02/16/17 0415 02/17/17 0415  NA 144 146*  K 2.9* 3.0*  CL 108 109  CO2 26 26  GLUCOSE 206* 147*  BUN 65* 49*  CREATININE 3.12* 2.48*  CALCIUM 8.4* 8.6*    PT/INR:  Recent Labs  02/17/17 0415  LABPROT 30.0*  INR 2.79   ABG    Component Value Date/Time   PHART 7.377 02/09/2017 1603   HCO3 23.0 02/09/2017 1603   TCO2 26 02/11/2017 1618   ACIDBASEDEF 2.0 02/09/2017 1603   O2SAT 60.5 02/17/2017 0424   CBG (last 3)   Recent Labs  02/17/17 0011 02/17/17 0425  02/17/17 0755  GLUCAP 113* 141* 126*    Assessment/Plan: S/P Procedure(s) (LRB): CORONARY ARTERY BYPASS GRAFTING (CABG) x 4                                                                               LIMA-LAD SEQ SVG-DIAG-OM SVG-PD (N/A) TRANSESOPHAGEAL ECHOCARDIOGRAM (TEE) (N/A) MITRAL VALVE REPLACEMENT (MVR) WITH MAGNA MITRAL EASE PERICARDIAL BIOPROSTHESIS 25 MM (N/A)  She is hemodynamically stable in sinus rhythm on dop 2.5 with Co-ox 60%. Will continue low dose dopamine today to aid diuresis with renal recovery. If kidneys continue to recover will add diuretic tomorrow. I think he kidneys like a higher BP. She is 26 lbs over preop wt but will need to diurese that slowly since she has low albumin and most of that is third space fluid that will not  be easy to remove until her nutritional condition is improved.  Continue IS and ambulation.  INR down slightly. Will give 2.5 mg coumadin tonight.  Maintaining sinus on IV amio. Switch to po.   LOS: 14 days    Gaye Pollack 02/17/2017

## 2017-02-18 LAB — BASIC METABOLIC PANEL
Anion gap: 7 (ref 5–15)
BUN: 41 mg/dL — ABNORMAL HIGH (ref 6–20)
CO2: 29 mmol/L (ref 22–32)
CREATININE: 2.26 mg/dL — AB (ref 0.44–1.00)
Calcium: 8.6 mg/dL — ABNORMAL LOW (ref 8.9–10.3)
Chloride: 111 mmol/L (ref 101–111)
GFR, EST AFRICAN AMERICAN: 23 mL/min — AB (ref >60)
GFR, EST NON AFRICAN AMERICAN: 19 mL/min — AB (ref >60)
Glucose, Bld: 131 mg/dL — ABNORMAL HIGH (ref 65–99)
Potassium: 3.1 mmol/L — ABNORMAL LOW (ref 3.5–5.1)
SODIUM: 147 mmol/L — AB (ref 135–145)

## 2017-02-18 LAB — COOXEMETRY PANEL
Carboxyhemoglobin: 1.6 % — ABNORMAL HIGH (ref 0.5–1.5)
METHEMOGLOBIN: 0.7 % (ref 0.0–1.5)
O2 Saturation: 56.6 %
TOTAL HEMOGLOBIN: 8.9 g/dL — AB (ref 12.0–16.0)

## 2017-02-18 LAB — CBC
HCT: 29.1 % — ABNORMAL LOW (ref 36.0–46.0)
Hemoglobin: 9.2 g/dL — ABNORMAL LOW (ref 12.0–15.0)
MCH: 24.3 pg — ABNORMAL LOW (ref 26.0–34.0)
MCHC: 31.6 g/dL (ref 30.0–36.0)
MCV: 77 fL — ABNORMAL LOW (ref 78.0–100.0)
PLATELETS: 372 10*3/uL (ref 150–400)
RBC: 3.78 MIL/uL — AB (ref 3.87–5.11)
RDW: 23.3 % — ABNORMAL HIGH (ref 11.5–15.5)
WBC: 17.1 10*3/uL — AB (ref 4.0–10.5)

## 2017-02-18 LAB — GLUCOSE, CAPILLARY
GLUCOSE-CAPILLARY: 121 mg/dL — AB (ref 65–99)
GLUCOSE-CAPILLARY: 130 mg/dL — AB (ref 65–99)
GLUCOSE-CAPILLARY: 154 mg/dL — AB (ref 65–99)
Glucose-Capillary: 146 mg/dL — ABNORMAL HIGH (ref 65–99)
Glucose-Capillary: 131 mg/dL — ABNORMAL HIGH (ref 65–99)
Glucose-Capillary: 118 mg/dL — ABNORMAL HIGH (ref 65–99)

## 2017-02-18 LAB — PROTIME-INR
INR: 2.81
Prothrombin Time: 30.2 seconds — ABNORMAL HIGH (ref 11.4–15.2)

## 2017-02-18 MED ORDER — WARFARIN SODIUM 2.5 MG PO TABS
2.5000 mg | ORAL_TABLET | Freq: Once | ORAL | Status: AC
  Administered 2017-02-18: 2.5 mg via ORAL
  Filled 2017-02-18: qty 1

## 2017-02-18 MED ORDER — TORSEMIDE 20 MG PO TABS
40.0000 mg | ORAL_TABLET | Freq: Every day | ORAL | Status: DC
  Administered 2017-02-18 – 2017-02-24 (×7): 40 mg via ORAL
  Filled 2017-02-18 (×7): qty 2

## 2017-02-18 MED ORDER — SODIUM CHLORIDE 0.9 % IV SOLN
30.0000 meq | Freq: Once | INTRAVENOUS | Status: AC
  Administered 2017-02-18: 30 meq via INTRAVENOUS
  Filled 2017-02-18: qty 15

## 2017-02-18 MED ORDER — AMIODARONE IV BOLUS ONLY 150 MG/100ML
150.0000 mg | Freq: Once | INTRAVENOUS | Status: AC
  Administered 2017-02-18: 150 mg via INTRAVENOUS
  Filled 2017-02-18: qty 100

## 2017-02-18 MED ORDER — POTASSIUM CHLORIDE CRYS ER 20 MEQ PO TBCR
20.0000 meq | EXTENDED_RELEASE_TABLET | Freq: Two times a day (BID) | ORAL | Status: AC
  Administered 2017-02-18 (×2): 20 meq via ORAL
  Filled 2017-02-18 (×2): qty 1

## 2017-02-18 NOTE — Progress Notes (Signed)
Patient ID: [name] [name], female   DOB: February 12, 1935, 82 y.o.   MRN: [id number]     Advanced Heart Failure Rounding Note  PCP:  Primary Cardiologist:   Subjective:    S/p emergent CABG x 4 and bioprosthetic MVR for LCX NSTEMI with flail MV on 2/15  Had repeat episode of AF with RVR 02/15/17 and again this am. Now back in NSR after IV amio bolus  Out 1 L yesterday and weight down 3 lbs. CVP 8-9  Feeling OK this am.  Throat remains sore. Mouth swollen and hard to eat with her dentures. Ambulating without difficulty, but still tired and sore.   Abx switch to meropenem. Cx remain negative. Afebrile. WBC trending down. 17.1 this am.  Echo with EF 40% and stable MVR  Objective:   Weight Range: 181 lb 3.5 oz (82.2 kg) Body mass index is 29.25 kg/m.   Vital Signs:   Temp:  [97.2 F (36.2 C)-99.1 F (37.3 C)] 97.7 F (36.5 C) (02/28 0730) Pulse Rate:  [67-138] 91 (02/28 0730) Resp:  [12-32] 15 (02/28 0730) BP: (117-148)/(62-116) 131/73 (02/28 0730) SpO2:  [97 %-100 %] 100 % (02/28 0730) Weight:  [181 lb 3.5 oz (82.2 kg)] 181 lb 3.5 oz (82.2 kg) (02/28 0500) Last BM Date: 02/17/17  Weight change: Filed Weights   02/16/17 0437 02/17/17 0445 02/18/17 0500  Weight: 182 lb 8.7 oz (82.8 kg) 184 lb 11.9 oz (83.8 kg) 181 lb 3.5 oz (82.2 kg)    Intake/Output:   Intake/Output Summary (Last 24 hours) at 02/18/17 8115 Last data filed at 02/18/17 0700  Gross per 24 hour  Intake          1204.47 ml  Output             2400 ml  Net         -1195.53 ml     Physical Exam: General:  Seated in chair eating breakfast. NAD. Hoarse.   HEENT: Normal Neck: Supple JVP 8-9. 2+ bilat; no bruits. No lymphadenopathy or thyromegaly appreciated. Cor: PMI nondisplaced. irregular. No M/G/R noted.   Lungs: Decreased basilar sounds.  Abdomen: soft, NT, ND, no HSM. No bruits or masses. +BS  Extremities: no cyanosis, clubbing, rash. RUE PICC. Trace to 1+ edema.   Neuro: A&Ox3 follows commands and is  oriented. Moves all 4  Telemetry: Personally reviewed, NSR. With breakthrough AF.   Labs: CBC  Recent Labs  02/17/17 0415 02/18/17 0441  WBC 19.4* 17.1*  HGB 9.2* 9.2*  HCT 28.5* 29.1*  MCV 76.2* 77.0*  PLT 380 726   Basic Metabolic Panel  Recent Labs  02/17/17 1700 02/18/17 0441  NA 147* 147*  K 3.2* 3.1*  CL 110 111  CO2 28 29  GLUCOSE 188* 131*  BUN 43* 41*  CREATININE 2.25* 2.26*  CALCIUM 8.8* 8.6*   Liver Function Tests No results for input(s): AST, ALT, ALKPHOS, BILITOT, PROT, ALBUMIN in the last 72 hours. No results for input(s): LIPASE, AMYLASE in the last 72 hours. Cardiac Enzymes No results for input(s): CKTOTAL, CKMB, CKMBINDEX, TROPONINI in the last 72 hours.  BNP: BNP (last 3 results) No results for input(s): BNP in the last 8760 hours.  ProBNP (last 3 results) No results for input(s): PROBNP in the last 8760 hours.   D-Dimer No results for input(s): DDIMER in the last 72 hours. Hemoglobin A1C No results for input(s): HGBA1C in the last 72 hours. Fasting Lipid Panel No results for input(s): CHOL, HDL, LDLCALC, TRIG,  CHOLHDL, LDLDIRECT in the last 72 hours. Thyroid Function Tests No results for input(s): TSH, T4TOTAL, T3FREE, THYROIDAB in the last 72 hours.  Invalid input(s): FREET3  Other results:     Imaging/Studies:  Dg Swallowing Func-speech Pathology  Result Date: 02/17/2017 Objective Swallowing Evaluation: Type of Study: MBS-Modified Barium Swallow Study Patient Details Name: [name] [name] MRN: [id number] Date of Birth: [birth year] Today's Date: 02/17/2017 Time: SLP Start Time (ACUTE ONLY): 1102-SLP Stop Time (ACUTE ONLY): 1120 SLP Time Calculation (min) (ACUTE ONLY): 18 min Past Medical History: Past Medical History: Diagnosis Date . [name]   a. LHC 02/03/17 for unstable angina showed severe 3v CAD w/ sequential 70% & 80% p-mLAD, 99% mLCx, CTO mRCA, elevated LV filling pressure . Colon polyp 2010 . Diabetes mellitus with  complication ([place])  . Glaucoma  . Hypertension  Past Surgical History: Past Surgical History: Procedure Laterality Date . APPENDECTOMY   . BREAST BIOPSY Right 1999 . COLONOSCOPY  2010 . CORONARY ARTERY BYPASS GRAFT N/A 02/05/2017  Procedure: CORONARY ARTERY BYPASS GRAFTING (CABG) x 4                                                                               LIMA-LAD SEQ SVG-DIAG-OM SVG-PD;  Surgeon: Gaye Pollack, MD;  Location: [facility];  Service: Open Heart Surgery;  Laterality: N/A; . IABP INSERTION N/A 02/04/2017  Procedure: IABP Insertion;  Surgeon: Leonie Man, MD;  Location: [place] [facility];  Service: Cardiovascular;  Laterality: N/A; . LEFT HEART CATH AND CORONARY ANGIOGRAPHY Left 02/03/2017  Procedure: Left Heart Cath and Coronary Angiography;  Surgeon: Nelva Bush, MD;  Location: [facility];  Service: Cardiovascular;  Laterality: Left; . MITRAL VALVE REPAIR N/A 02/05/2017  Procedure: MITRAL VALVE REPLACEMENT (MVR) WITH MAGNA MITRAL EASE PERICARDIAL BIOPROSTHESIS 25 MM;  Surgeon: Gaye Pollack, MD;  Location: [facility];  Service: Open Heart Surgery;  Laterality: N/A; . PARATHYROIDECTOMY    1986 . RIGHT HEART CATH N/A 02/04/2017  Procedure: Right Heart Cath;  Surgeon: Leonie Man, MD;  Location: [facility];  Service: Cardiovascular;  Laterality: N/A; . TEE WITHOUT CARDIOVERSION N/A 02/05/2017  Procedure: TRANSESOPHAGEAL ECHOCARDIOGRAM (TEE);  Surgeon: Gaye Pollack, MD;  Location: [facility];  Service: Open Heart Surgery;  Laterality: N/A; HPI: [name] [name] is a 82 year old woman with history of type 2 diabetes mellitus and hypertension, who presented to cardiology clinic due to acute chest pain that began 3 days ago while doing light housework. EKG showed abnormalities concerning for ischemia. Urgent cardiac cath revealed three-vessel CAD, including serial 70 and 80% proximal/mid LAD stenoses. Underwent CABG x 4 2/15 and extubated 2/19. CXR No pneumothorax or pulmonary edema. Left lung  base atelectasis and possible small pleural effusion. Previous MBS on 02/13/2017-recommended Dys 1, honey thick. No Data Recorded Assessment / Plan / Recommendation CHL IP CLINICAL IMPRESSIONS 02/17/2017 Clinical Impression [name] [name] demonstrated a mild oral and moderate sensorimotor pharyngeal dysphagia. Oral phase impairments included lingual/palatal residue, delayed oral transit, and holding (improved orally since previous MBS on 02/13/2017). Observed silent gross aspiration during the swallow with nectar thick liquids via cup due to decreased laryngeal closure. Skilled intervention included use  of chin tuck technique with subsequent trials of nectar thick liquids resulting in silent penetration to the level of the vocal cords. Noted mild delayed swallow initiation to the valleculae with cup sips of honey thick liquids. Pt's alertness and oral phase impairments have improved, however, she is still at risk for aspiration with any liquid thinner than honey thick. Recommend upgrading solids from Dys 2 to Dys 3, continue honey thick liquids via cup/spoon (no straw), meds crushed. ST will f/u for treatment to assess diet tolerance and for possible initiation of pharyngeal strengthening exercises. SLP Visit Diagnosis Dysphagia, oral phase (R13.11);Dysphagia, pharyngeal phase (R13.13) Attention and concentration deficit following -- Frontal lobe and executive function deficit following -- Impact on safety and function Moderate aspiration risk   CHL IP TREATMENT RECOMMENDATION 02/17/2017 Treatment Recommendations Therapy as outlined in treatment plan below   Prognosis 02/17/2017 Prognosis for Safe Diet Advancement Good Barriers to Reach Goals -- Barriers/Prognosis Comment -- CHL IP DIET RECOMMENDATION 02/17/2017 SLP Diet Recommendations Dysphagia 3 (Mech soft) solids;Honey thick liquids Liquid Administration via Cup;No straw;Spoon Medication Administration Crushed with puree Compensations Slow rate;Small sips/bites;Multiple  dry swallows after each bite/sip Postural Changes Seated upright at 90 degrees   CHL IP OTHER RECOMMENDATIONS 02/17/2017 Recommended Consults -- Oral Care Recommendations Oral care BID Other Recommendations --   CHL IP FOLLOW UP RECOMMENDATIONS 02/17/2017 Follow up Recommendations Skilled Nursing facility   [facility] IP FREQUENCY AND DURATION 02/17/2017 Speech Therapy Frequency (ACUTE ONLY) min 2x/week Treatment Duration 2 weeks      CHL IP ORAL PHASE 02/17/2017 Oral Phase Impaired Oral - Pudding Teaspoon -- Oral - Pudding Cup -- Oral - Honey Teaspoon NT Oral - Honey Cup Delayed oral transit Oral - Nectar Teaspoon -- Oral - Nectar Cup Lingual/palatal residue;Delayed oral transit;Holding of bolus Oral - Nectar Straw -- Oral - Thin Teaspoon -- Oral - Thin Cup NT Oral - Thin Straw -- Oral - Puree NT Oral - Mech Soft -- Oral - Regular Lingual/palatal residue Oral - Multi-Consistency -- Oral - Pill -- Oral Phase - Comment --  CHL IP PHARYNGEAL PHASE 02/17/2017 Pharyngeal Phase Impaired Pharyngeal- Pudding Teaspoon -- Pharyngeal -- Pharyngeal- Pudding Cup -- Pharyngeal -- Pharyngeal- Honey Teaspoon NT Pharyngeal -- Pharyngeal- Honey Cup Delayed swallow initiation-vallecula Pharyngeal -- Pharyngeal- Nectar Teaspoon -- Pharyngeal -- Pharyngeal- Nectar Cup Penetration/Aspiration during swallow;Reduced airway/laryngeal closure;Reduced laryngeal elevation Pharyngeal Material enters airway, passes BELOW cords without attempt by patient to eject out (silent aspiration);Material enters airway, CONTACTS cords and not ejected out;Material enters airway, remains ABOVE vocal cords then ejected out Pharyngeal- Nectar Straw -- Pharyngeal -- Pharyngeal- Thin Teaspoon -- Pharyngeal -- Pharyngeal- Thin Cup NT Pharyngeal -- Pharyngeal- Thin Straw -- Pharyngeal -- Pharyngeal- Puree NT Pharyngeal -- Pharyngeal- Mechanical Soft -- Pharyngeal -- Pharyngeal- Regular WFL Pharyngeal -- Pharyngeal- Multi-consistency -- Pharyngeal -- Pharyngeal- Pill --  Pharyngeal -- Pharyngeal Comment --  CHL IP CERVICAL ESOPHAGEAL PHASE 02/17/2017 Cervical Esophageal Phase WFL Pudding Teaspoon -- Pudding Cup -- Honey Teaspoon -- Honey Cup -- Nectar Teaspoon -- Nectar Cup -- Nectar Straw -- Thin Teaspoon -- Thin Cup -- Thin Straw -- Puree -- Mechanical Soft -- Regular -- Multi-consistency -- Pill -- Cervical Esophageal Comment -- No flowsheet data found. Houston Siren 02/17/2017, 2:34 PM  Orbie Pyo Colvin Caroli.Ed CCC-SLP Pager [phone]                Medications:     Scheduled Medications: . amiodarone  400 mg Oral BID  . aspirin  81 mg  Oral Daily  . atorvastatin  80 mg Oral q1800  . Chlorhexidine Gluconate Cloth  6 each Topical Daily  . insulin aspart  0-15 Units Subcutaneous Q4H  . magic mouthwash  10 mL Oral QID  . mouth rinse  15 mL Mouth Rinse q12n4p  . meropenem (MERREM) IV  500 mg Intravenous Q12H  . pantoprazole (PROTONIX) IV  40 mg Intravenous Q24H  . potassium chloride (KCL MULTIRUN) 30 mEq in 265 mL IVPB  30 mEq Intravenous Once  . potassium chloride  20 mEq Oral BID  . sodium chloride flush  10-40 mL Intracatheter Q12H  . sodium chloride flush  3 mL Intravenous Q12H  . torsemide  40 mg Oral Daily  . warfarin  2.5 mg Oral ONCE-1800  . Warfarin - Physician Dosing Inpatient   Does not apply q1800    Infusions: . dextrose 5 % and 0.45% NaCl 10 mL/hr at 02/18/17 0700    PRN Medications: hydrALAZINE, ondansetron (ZOFRAN) IV, RESOURCE THICKENUP CLEAR, sodium chloride flush, sodium chloride flush   Assessment/Plan   1. CAD [name] on 2/15 2. Severe MR with flail segment s/p MVR c/ bioprosthetic valve. 3. Cardiogenic shock: Echo pre-op with EF 40-45%, lateral akinesis, severe MR.     --Echo 2/22 EF 40% MVR ok 4. AKI 5. Shock Liver 6. Acute respiratory failure with hypoxemia requiring intubation 7. Thrombocytopenia: Post-op. 8. Tracheal aspirate with Strep pneumoniae 9. PAF 10. Leukocytosis 11. Hypokalemia  Coox  pending. Pt off dopamine.    Creatinine stabilized.  Volume mildly up but good response to torsemide thus far. CVP 8-9. Will follow for additional diuretic need. Agree with K supp.    WBC trending down. Cultures negative except for previous strep pneumo in sputum. Continue   meropenem.   Had breakthrough Afib 02/15/17. Converted with IV amio. Now on amio 400 mg BID.  Remains in NSR.  warfarin for MV and PAF. INR 2.81  Mobilize as tolerated.    Shirley Friar, PA-C  9:28 AM  Advanced Heart Failure Team Pager [phone] (M-F; 7a - 4p)  Please contact [facility] for night-coverage after hours (4p -7a ) and weekends on [email or web address]  Patient seen and examined with Oda Kilts, PA-C. We discussed all aspects of the encounter. I agree with the assessment and plan as stated above.   Continues to progress slowly. Had another episode of breakthrough AF this am converted with IV amio bolus. Dopamine now off.  Diuresing well on low dose torsemide. Renal function stable. K low - will supp. Still about 20 pounds up from baseline. Co-ox marginal. Will need to watch off dopamine.   WBC finally coming down. Continue meropenem. Cx data unremarkable.   Continue to mobilize.   Bensimhon, Daniel,MD 9:17 PM

## 2017-02-18 NOTE — Progress Notes (Signed)
      [address]       [place] [address]             [phone]      POD # 14 CABG MVR  Up in chair  BP (!) 155/70 (BP Location: Left Arm)   Pulse 71   Temp 98 F (36.7 C) (Oral)   Resp (!) 22   Ht 5\' 6"  (1.676 m)   Wt 181 lb 3.5 oz (82.2 kg)   SpO2 95%   BMI 29.25 kg/m    Intake/Output Summary (Last 24 hours) at 02/18/17 1705 Last data filed at 02/18/17 1025  Gross per 24 hour  Intake          1381.69 ml  Output             1200 ml  Net           181.69 ml   CBG OK Co-ox 56.6 this AM  Remo Lipps C. Roxan Hockey, MD [facility] [phone]

## 2017-02-18 NOTE — Progress Notes (Signed)
13 Days Post-Op Procedure(s) (LRB): CORONARY ARTERY BYPASS GRAFTING (CABG) x 4                                                                               LIMA-LAD SEQ SVG-DIAG-OM SVG-PD (N/A) TRANSESOPHAGEAL ECHOCARDIOGRAM (TEE) (N/A) MITRAL VALVE REPLACEMENT (MVR) WITH MAGNA MITRAL EASE PERICARDIAL BIOPROSTHESIS 25 MM (N/A) Subjective:  No complaints this am.  Had breakthrough atrial fib with RVR early this am and converted with 150 mg bolus of IV amiodarone.  Objective: Vital signs in last 24 hours: Temp:  [97.2 F (36.2 C)-99.1 F (37.3 C)] 97.7 F (36.5 C) (02/28 0730) Pulse Rate:  [67-138] 90 (02/28 1000) Cardiac Rhythm: Normal sinus rhythm (02/28 0800) Resp:  [12-32] 19 (02/28 1000) BP: (117-148)/(62-116) 139/75 (02/28 1000) SpO2:  [98 %-100 %] 99 % (02/28 1000) Weight:  [181 lb 3.5 oz (82.2 kg)] 181 lb 3.5 oz (82.2 kg) (02/28 0500)  Hemodynamic parameters for last 24 hours: CVP:  [9 mmHg-13 mmHg] 9 mmHg  Intake/Output from previous day: 02/27 0701 - 02/28 0700 In: 1303.5 [P.O.:420; I.V.:508.5; IV Piggyback:365] Out: 2400 [Urine:2400] Intake/Output this shift: Total I/O In: 586.3 [P.O.:240; I.V.:31.3; IV Piggyback:315] Out: -   General appearance: alert and cooperative Neurologic: intact Heart: regular rate and rhythm, S1, S2 normal, no murmur, click, rub or gallop Lungs: diminished breath sounds bibasilar Extremities: edema moderate Wound: incisions ok  Lab Results:  Recent Labs  02/17/17 0415 02/18/17 0441  WBC 19.4* 17.1*  HGB 9.2* 9.2*  HCT 28.5* 29.1*  PLT 380 372   BMET:  Recent Labs  02/17/17 1700 02/18/17 0441  NA 147* 147*  K 3.2* 3.1*  CL 110 111  CO2 28 29  GLUCOSE 188* 131*  BUN 43* 41*  CREATININE 2.25* 2.26*  CALCIUM 8.8* 8.6*    PT/INR:  Recent Labs  02/18/17 0441  LABPROT 30.2*  INR 2.81   ABG    Component Value Date/Time   PHART 7.377 02/09/2017 1603   HCO3 23.0 02/09/2017 1603   TCO2 26 02/11/2017 1618   ACIDBASEDEF 2.0 02/09/2017 1603   O2SAT 56.6 02/18/2017 0929   CBG (last 3)   Recent Labs  02/17/17 2359 02/18/17 0357 02/18/17 0812  GLUCAP 93 121* 146*    Assessment/Plan: S/P Procedure(s) (LRB): CORONARY ARTERY BYPASS GRAFTING (CABG) x 4                                                                               LIMA-LAD SEQ SVG-DIAG-OM SVG-PD (N/A) TRANSESOPHAGEAL ECHOCARDIOGRAM (TEE) (N/A) MITRAL VALVE REPLACEMENT (MVR) WITH MAGNA MITRAL EASE PERICARDIAL BIOPROSTHESIS 25 MM (N/A)  She is hemodynamically stable in sinus rhythm this am. Dopamine weaned off due to rapid atrial fib early this am.  Diuresed well yesterday and weight down almost 4 lbs. Still 23 lbs over preop. Creatinine stable from yesterday. Continue slow diuresis and replace K+.  Continue IS and  ambulation.  INR therapeutic. Will give 2.5 mg tonight.  She has bilateral lower lobe atelectasis and effusions on exam. Will repeat CXR tomorrow. This should improve with continued diuresis and mobilization.   LOS: 15 days    Gaye Pollack 02/18/2017

## 2017-02-19 ENCOUNTER — Inpatient Hospital Stay (HOSPITAL_COMMUNITY): Payer: Medicare Other

## 2017-02-19 LAB — GLUCOSE, CAPILLARY
GLUCOSE-CAPILLARY: 106 mg/dL — AB (ref 65–99)
GLUCOSE-CAPILLARY: 139 mg/dL — AB (ref 65–99)
GLUCOSE-CAPILLARY: 124 mg/dL — AB (ref 65–99)
Glucose-Capillary: 113 mg/dL — ABNORMAL HIGH (ref 65–99)
Glucose-Capillary: 173 mg/dL — ABNORMAL HIGH (ref 65–99)
Glucose-Capillary: 150 mg/dL — ABNORMAL HIGH (ref 65–99)

## 2017-02-19 LAB — BASIC METABOLIC PANEL
ANION GAP: 8 (ref 5–15)
BUN: 41 mg/dL — ABNORMAL HIGH (ref 6–20)
CALCIUM: 8.6 mg/dL — AB (ref 8.9–10.3)
CO2: 28 mmol/L (ref 22–32)
Chloride: 112 mmol/L — ABNORMAL HIGH (ref 101–111)
Creatinine, Ser: 2.3 mg/dL — ABNORMAL HIGH (ref 0.44–1.00)
GFR calc Af Amer: 22 mL/min — ABNORMAL LOW (ref >60)
GFR, EST NON AFRICAN AMERICAN: 19 mL/min — AB (ref >60)
GLUCOSE: 109 mg/dL — AB (ref 65–99)
POTASSIUM: 3.1 mmol/L — AB (ref 3.5–5.1)
SODIUM: 148 mmol/L — AB (ref 135–145)

## 2017-02-19 LAB — CBC
HEMATOCRIT: 28.9 % — AB (ref 36.0–46.0)
HEMOGLOBIN: 9.3 g/dL — AB (ref 12.0–15.0)
MCH: 24.6 pg — ABNORMAL LOW (ref 26.0–34.0)
MCHC: 32.2 g/dL (ref 30.0–36.0)
MCV: 76.5 fL — ABNORMAL LOW (ref 78.0–100.0)
Platelets: 376 10*3/uL (ref 150–400)
RBC: 3.78 MIL/uL — ABNORMAL LOW (ref 3.87–5.11)
RDW: 23.8 % — AB (ref 11.5–15.5)
WBC: 16.5 10*3/uL — AB (ref 4.0–10.5)

## 2017-02-19 LAB — PROTIME-INR
INR: 2.87
PROTHROMBIN TIME: 30.7 s — AB (ref 11.4–15.2)

## 2017-02-19 LAB — COOXEMETRY PANEL
CARBOXYHEMOGLOBIN: 1.3 % (ref 0.5–1.5)
Methemoglobin: 1 % (ref 0.0–1.5)
O2 SAT: 64.9 %
TOTAL HEMOGLOBIN: 9.5 g/dL — AB (ref 12.0–16.0)

## 2017-02-19 MED ORDER — POTASSIUM CHLORIDE CRYS ER 20 MEQ PO TBCR: 20.0000 meq | EXTENDED_RELEASE_TABLET | Freq: Every day | ORAL | Status: DC

## 2017-02-19 MED ORDER — TRAMADOL HCL 50 MG PO TABS: 50.0000 mg | ORAL_TABLET | ORAL | Status: DC | PRN

## 2017-02-19 MED ORDER — WARFARIN SODIUM 2.5 MG PO TABS: 2.5000 mg | ORAL_TABLET | Freq: Every day | ORAL | Status: DC

## 2017-02-19 MED ORDER — SODIUM CHLORIDE 0.9 % IV SOLN: 250.0000 mL | INTRAVENOUS | Status: DC | PRN

## 2017-02-19 MED ORDER — SODIUM CHLORIDE 0.9% FLUSH: 3.0000 mL | Freq: Two times a day (BID) | INTRAVENOUS | Status: DC

## 2017-02-19 MED ORDER — DOCUSATE SODIUM 100 MG PO CAPS: 200.0000 mg | ORAL_CAPSULE | Freq: Every day | ORAL | Status: DC

## 2017-02-19 MED ORDER — SODIUM CHLORIDE 0.9 % IV SOLN
30.0000 meq | Freq: Once | INTRAVENOUS | Status: AC
  Administered 2017-02-19: 30 meq via INTRAVENOUS
  Filled 2017-02-19: qty 15

## 2017-02-19 MED ORDER — BISACODYL 10 MG RE SUPP: 10.0000 mg | Freq: Every day | RECTAL | Status: DC | PRN

## 2017-02-19 MED ORDER — SODIUM CHLORIDE 0.9% FLUSH
10.0000 mL | INTRAVENOUS | Status: DC | PRN
  Administered 2017-02-20 – 2017-02-28 (×5): 10 mL
  Administered 2017-03-04 – 2017-03-05 (×2): 20 mL
  Filled 2017-02-19 (×7): qty 40

## 2017-02-19 MED ORDER — MOVING RIGHT ALONG BOOK
Freq: Once | Status: DC
  Filled 2017-02-19: qty 1

## 2017-02-19 MED ORDER — BISACODYL 5 MG PO TBEC: 10.0000 mg | DELAYED_RELEASE_TABLET | Freq: Every day | ORAL | Status: DC | PRN

## 2017-02-19 MED ORDER — SODIUM CHLORIDE 0.9% FLUSH: 3.0000 mL | INTRAVENOUS | Status: DC | PRN

## 2017-02-19 MED ORDER — WARFARIN SODIUM 2.5 MG PO TABS
2.5000 mg | ORAL_TABLET | Freq: Once | ORAL | Status: AC
  Administered 2017-02-19: 2.5 mg via ORAL
  Filled 2017-02-19: qty 1

## 2017-02-19 NOTE — Progress Notes (Signed)
Patient ID: [name] [name], female   DOB: May 09, 1935, 82 y.o.   MRN: [id number]     Advanced Heart Failure Rounding Note  PCP: Marden Noble, MD Primary Cardiologist: Dr. Yvone Neu [facility]) HF: Dr. Haroldine Laws   Subjective:    S/p emergent CABG x 4 and bioprosthetic MVR for LCX NSTEMI with flail MV on 2/15  Had repeat episode of AF with RVR 02/15/17 and again this am. Now back in NSR after IV amio bolus  Coox 64.9% this am.  CVP ~10. Out only 200 cc and down 1 lb. Creatinine relatively stable.   Feeling gradually better and stronger. Voice slowly improving.   Abx switch to meropenem. Cx remain negative. Afebrile. WBC 16.5 this am.   Echo with EF 40% and stable MVR  Objective:   Weight Range: 180 lb 5.4 oz (81.8 kg) Body mass index is 29.11 kg/m.   Vital Signs:   Temp:  [98 F (36.7 C)-99 F (37.2 C)] 98.3 F (36.8 C) (03/01 0842) Pulse Rate:  [71-95] 93 (03/01 0800) Resp:  [15-27] 18 (03/01 0800) BP: (133-160)/(67-93) 136/85 (03/01 0800) SpO2:  [91 %-100 %] 95 % (03/01 0800) Weight:  [180 lb 5.4 oz (81.8 kg)] 180 lb 5.4 oz (81.8 kg) (03/01 0500) Last BM Date: 02/18/17  Weight change: Filed Weights   02/17/17 0445 02/18/17 0500 02/19/17 0500  Weight: 184 lb 11.9 oz (83.8 kg) 181 lb 3.5 oz (82.2 kg) 180 lb 5.4 oz (81.8 kg)    Intake/Output:   Intake/Output Summary (Last 24 hours) at 02/19/17 1021 Last data filed at 02/19/17 0701  Gross per 24 hour  Intake              788 ml  Output              850 ml  Net              -62 ml     Physical Exam: General:  Seated in chair. NAD. Remains hoarse.    HEENT: Normal Neck: Supple JVP 9-10, 2+ bilat; no bruits. No lymphadenopathy or thyromegaly appreciated. Cor: PMI nondisplaced. irregular. No M/G/R noted.   Lungs: Diminished basilar sounds.   Abdomen: soft, NT, ND, no HSM. No bruits or masses. +BS  Extremities: no cyanosis, clubbing, rash. RUE PICC. 1-2+ ankle edema.    Neuro: A&Ox3 follows commands and is  oriented. Moves all 4  Telemetry: Personally reviewed, NSR in 90s.    Labs: CBC  Recent Labs  02/18/17 0441 02/19/17 0521  WBC 17.1* 16.5*  HGB 9.2* 9.3*  HCT 29.1* 28.9*  MCV 77.0* 76.5*  PLT 372 409   Basic Metabolic Panel  Recent Labs  02/18/17 0441 02/19/17 0521  NA 147* 148*  K 3.1* 3.1*  CL 111 112*  CO2 29 28  GLUCOSE 131* 109*  BUN 41* 41*  CREATININE 2.26* 2.30*  CALCIUM 8.6* 8.6*   Liver Function Tests No results for input(s): AST, ALT, ALKPHOS, BILITOT, PROT, ALBUMIN in the last 72 hours. No results for input(s): LIPASE, AMYLASE in the last 72 hours. Cardiac Enzymes No results for input(s): CKTOTAL, CKMB, CKMBINDEX, TROPONINI in the last 72 hours.  BNP: BNP (last 3 results) No results for input(s): BNP in the last 8760 hours.  ProBNP (last 3 results) No results for input(s): PROBNP in the last 8760 hours.   D-Dimer No results for input(s): DDIMER in the last 72 hours. Hemoglobin A1C No results for input(s): HGBA1C in the last 72 hours. Fasting  Lipid Panel No results for input(s): CHOL, HDL, LDLCALC, TRIG, CHOLHDL, LDLDIRECT in the last 72 hours. Thyroid Function Tests No results for input(s): TSH, T4TOTAL, T3FREE, THYROIDAB in the last 72 hours.  Invalid input(s): FREET3  Other results:     Imaging/Studies:  Dg Chest Port 1 View  Result Date: 02/19/2017 CLINICAL DATA:  Shortness of breath.  Pleural effusion . EXAM: PORTABLE CHEST 1 VIEW COMPARISON:  02/16/2017 . FINDINGS: Right PICC line noted in stable position. Prior CABG. Prior cardiac valve replacement. Cardiomegaly with interim improvement pulmonary interstitial edema. Small left pleural effusion. Basilar atelectasis and/or pneumonia. No pneumothorax . IMPRESSION: 1. Right PICC line in stable position. 2. Prior CABG. Prior cardiac valve replacement. Cardiomegaly with pulmonary vascular congestion. Interim mprovement of pulmonary interstitial edema. Persistent basilar atelectasis and  infiltrates. Small left pleural effusion. Interim resolution of right pleural effusion . Electronically Signed   By: Marcello Moores  Register   On: 02/19/2017 07:49      Medications:     Scheduled Medications: . amiodarone  400 mg Oral BID  . aspirin  81 mg Oral Daily  . atorvastatin  80 mg Oral q1800  . Chlorhexidine Gluconate Cloth  6 each Topical Daily  . insulin aspart  0-15 Units Subcutaneous Q4H  . magic mouthwash  10 mL Oral QID  . mouth rinse  15 mL Mouth Rinse q12n4p  . meropenem (MERREM) IV  500 mg Intravenous Q12H  . pantoprazole (PROTONIX) IV  40 mg Intravenous Q24H  . sodium chloride flush  10-40 mL Intracatheter Q12H  . sodium chloride flush  3 mL Intravenous Q12H  . torsemide  40 mg Oral Daily  . Warfarin - Physician Dosing Inpatient   Does not apply q1800    Infusions: . dextrose 5 % and 0.45% NaCl Stopped (02/18/17 0900)    PRN Medications: hydrALAZINE, ondansetron (ZOFRAN) IV, RESOURCE THICKENUP CLEAR, sodium chloride flush, sodium chloride flush   Assessment/Plan   1. CAD [name] on 2/15 2. Severe MR with flail segment s/p MVR c/ bioprosthetic valve. 3. Cardiogenic shock: Echo pre-op with EF 40-45%, lateral akinesis, severe MR.     --Echo 2/22 EF 40% MVR ok 4. AKI 5. Shock Liver 6. Acute respiratory failure with hypoxemia requiring intubation 7. Thrombocytopenia: Post-op. 8. Tracheal aspirate with Strep pneumoniae 9. PAF 10. Leukocytosis 11. Hypokalemia  Coox stable.   Creatinine stabilized around 2.2 - 2.3. Volume up somewhat today. CVP 9-10. Got am Torsemide.     May need dose of IV lasix vs increase torsemide if continues to trend up.  Weight down, but ? Accuracy.  WBC trending down gradually on meropenem.  Cultures negative except for previous strep pneumo in sputum.   Had breakthrough Afib 02/15/17. Converted with IV amio. Now on amio 400 mg BID.  Remains in NSR.  warfarin for MV and PAF. INR 2.87 this am.  Continue to mobilize.   Shirley Friar, PA-C  10:21 AM  Advanced Heart Failure Team Pager [phone] (M-F; 7a - 4p)  Please contact [facility] for night-coverage after hours (4p -7a ) and weekends on [email or web address]  Patient seen and examined with Oda Kilts, PA-C. We discussed all aspects of the encounter. I agree with the assessment and plan as stated above.   Continues to progress slowly. Off dopamine. Maintaining NSR on po amio. Will continue,   Diuresing slowly on low dose torsemide. Renal function stable. K low - will supp. Still about 20 pounds up from baseline. Co-ox 65%  WBC continues to come down. Continue meropenem. Cx data unremarkable.   Continue to mobilize.   Jamae Tison,MD 9:17 PM

## 2017-02-19 NOTE — Progress Notes (Signed)
Pt acutely nauseous at 0200. Given IV Zofran. CBG checked to r/o hypoglycemia, value WDL. At 0230 pt said nausea had dissipated. Will continue to monitor. Unclear what caused nausea.

## 2017-02-19 NOTE — Progress Notes (Signed)
Physical Therapy Treatment Patient Details Name: [name] [name] MRN: [id number] DOB: 1934/12/30 Today's Date: 02/19/2017    History of Present Illness 82 y.o. female S/p emergent CABG x 4 and bioprosthetic MVR for LCX NSTEMI with flail MV on 2/15. PMH significant for DM and HTN.    PT Comments    Pt progressing with her mobility during PT session, able to ambulate 150 ft with rw. SpO2 remaining in 90s on RA throughout session and HR elevating to 120 bpm. PT to continue to follow and progress her mobility as tolerated. Pt agreeable and motivated for session.    Follow Up Recommendations  SNF;Supervision for mobility/OOB     Equipment Recommendations  Rolling walker with 5" wheels    Recommendations for Other Services       Precautions / Restrictions Precautions Precautions: Sternal;Fall Precaution Comments: reviewed sternal precautions Restrictions Weight Bearing Restrictions: Yes Other Position/Activity Restrictions: sternal precautions    Mobility  Bed Mobility               General bed mobility comments: up in chair upon arrival  Transfers Overall transfer level: Needs assistance Equipment used: Rolling walker (2 wheeled) Transfers: Sit to/from Stand Sit to Stand: Min assist         General transfer comment: cues for sternal precautions  Ambulation/Gait Ambulation/Gait assistance: Min guard Ambulation Distance (Feet): 150 Feet Assistive device: Rolling walker (2 wheeled) Gait Pattern/deviations: Step-through pattern;Decreased step length - right;Decreased step length - left;Decreased stride length Gait velocity: decreased   General Gait Details: cues for standing posture   Stairs            Wheelchair Mobility    Modified Rankin (Stroke Patients Only)       Balance Overall balance assessment: Needs assistance Sitting-balance support: Feet supported;No upper extremity supported Sitting balance-Leahy Scale: Good     Standing balance  support: Bilateral upper extremity supported;No upper extremity supported Standing balance-Leahy Scale: Poor Standing balance comment: using rw for support                    Cognition Arousal/Alertness: Awake/alert Behavior During Therapy: WFL for tasks assessed/performed Overall Cognitive Status: Within Functional Limits for tasks assessed                      Exercises      General Comments        Pertinent Vitals/Pain Pain Assessment: No/denies pain    Home Living                      Prior Function            PT Goals (current goals can now be found in the care plan section) Acute Rehab PT Goals Patient Stated Goal: get better Progress towards PT goals: Progressing toward goals    Frequency    Min 3X/week      PT Plan Current plan remains appropriate    Co-evaluation             End of Session Equipment Utilized During Treatment: Gait belt Activity Tolerance: Patient tolerated treatment well Patient left: in chair;with call bell/phone within reach Nurse Communication: Mobility status PT Visit Diagnosis: Unsteadiness on feet (R26.81);Muscle weakness (generalized) (M62.81);Difficulty in walking, not elsewhere classified (R26.2)     Time: 8563-1497 PT Time Calculation (min) (ACUTE ONLY): 20 min  Charges:  $Gait Training: 8-22 mins  G Codes:       Cassell Clement, PT, CSCS Pager [phone] Office [phone]  02/19/2017, 5:14 PM

## 2017-02-19 NOTE — Progress Notes (Signed)
  Speech Language Pathology Treatment: Dysphagia  Patient Details Name: [name] [name] MRN: [id number] DOB: 03/06/1935 Today's Date: 02/19/2017 Time: 0626-9485 SLP Time Calculation (min) (ACUTE ONLY): 13 min  Assessment / Plan / Recommendation Clinical Impression  F/u diet tolerance assessment complete revealing what appears to be good tolerance of current diet. No temp spikes or changes in lung sounds noted. Patient able to consume honey thick liquids via self fed cup sips with independent use of safe swallowing strategies (small single sips, multiple swallows). Nurse present and SLP provided verbal assistance with appropriate thickening of liquids. Overall, patient tolerating diet, demonstrating fatigue today following move to new unit. Will continue to f/u.     HPI HPI: [name] [name] is a 82 year old woman with history of type 2 diabetes mellitus and hypertension, who presented to cardiology clinic due to acute chest pain that began 3 days ago while doing light housework. EKG showed abnormalities concerning for ischemia. Urgent cardiac cath revealed three-vessel CAD, including serial 70 and 80% proximal/mid LAD stenoses. Underwent CABG x 4 2/15 and extubated 2/19. CXR No pneumothorax or pulmonary edema. Left lung base atelectasis and possible small pleural effusion. Previous MBS on 02/13/2017-recommended Dys 1, honey thick.      SLP Plan  Continue with current plan of care       Recommendations  Diet recommendations: Dysphagia 3 (mechanical soft);Honey-thick liquid Liquids provided via: Cup Medication Administration: Crushed with puree Supervision: Patient able to self feed;Intermittent supervision to cue for compensatory strategies Compensations: Slow rate;Small sips/bites;Multiple dry swallows after each bite/sip Postural Changes and/or Swallow Maneuvers: Seated upright 90 degrees                Oral Care Recommendations: Oral care BID Follow up Recommendations: Skilled Nursing  facility SLP Visit Diagnosis: Dysphagia, oral phase (R13.11);Dysphagia, pharyngeal phase (R13.13) Plan: Continue with current plan of care       [place], CCC-SLP [phone]    Gabriel Rainwater Meryl 02/19/2017, 1:44 PM

## 2017-02-19 NOTE — Progress Notes (Addendum)
[facility] DAILY ICU PROGRESS NOTE                   [address]            [place] [address]          [phone]   14 Days Post-Op Procedure(s) (LRB): CORONARY ARTERY BYPASS GRAFTING (CABG) x 4                                                                               LIMA-LAD SEQ SVG-DIAG-OM SVG-PD (N/A) TRANSESOPHAGEAL ECHOCARDIOGRAM (TEE) (N/A) MITRAL VALVE REPLACEMENT (MVR) WITH MAGNA MITRAL EASE PERICARDIAL BIOPROSTHESIS 25 MM (N/A)  Total Length of Stay:  LOS: 16 days   Subjective: conts to feel better/stronger   Objective: Vital signs in last 24 hours: Temp:  [98 F (36.7 C)-99 F (37.2 C)] 98.8 F (37.1 C) (03/01 0400) Pulse Rate:  [71-95] 93 (03/01 0800) Cardiac Rhythm: Normal sinus rhythm (03/01 0800) Resp:  [15-27] 18 (03/01 0800) BP: (133-160)/(67-93) 136/85 (03/01 0800) SpO2:  [91 %-100 %] 95 % (03/01 0800) Weight:  [180 lb 5.4 oz (81.8 kg)] 180 lb 5.4 oz (81.8 kg) (03/01 0500)  Filed Weights   02/17/17 0445 02/18/17 0500 02/19/17 0500  Weight: 184 lb 11.9 oz (83.8 kg) 181 lb 3.5 oz (82.2 kg) 180 lb 5.4 oz (81.8 kg)    Weight change: -14.1 oz (-0.4 kg)   Hemodynamic parameters for last 24 hours: CVP:  [9 mmHg-11 mmHg] 11 mmHg  Intake/Output from previous day: 02/28 0701 - 03/01 0700 In: 1059.3 [P.O.:600; I.V.:34.3; IV Piggyback:365] Out: 850 [Urine:850]  Intake/Output this shift: Total I/O In: 265 [IV Piggyback:265] Out: -   Current Meds: Scheduled Meds: . amiodarone  400 mg Oral BID  . aspirin  81 mg Oral Daily  . atorvastatin  80 mg Oral q1800  . Chlorhexidine Gluconate Cloth  6 each Topical Daily  . insulin aspart  0-15 Units Subcutaneous Q4H  . magic mouthwash  10 mL Oral QID  . mouth rinse  15 mL Mouth Rinse q12n4p  . meropenem (MERREM) IV  500 mg Intravenous Q12H  . pantoprazole (PROTONIX) IV  40 mg Intravenous Q24H  . potassium chloride (KCL MULTIRUN) 30 mEq in 265 mL IVPB  30 mEq Intravenous Once  . sodium chloride  flush  10-40 mL Intracatheter Q12H  . sodium chloride flush  3 mL Intravenous Q12H  . torsemide  40 mg Oral Daily  . Warfarin - Physician Dosing Inpatient   Does not apply q1800   Continuous Infusions: . dextrose 5 % and 0.45% NaCl Stopped (02/18/17 0900)   PRN Meds:.hydrALAZINE, ondansetron (ZOFRAN) IV, RESOURCE THICKENUP CLEAR, sodium chloride flush, sodium chloride flush  General appearance: alert, cooperative and no distress Heart: regular rate and rhythm Lungs: dim in bases Abdomen: benign Extremities: + pitting edema BLE Wound: incis healing well  Lab Results: CBC: Recent Labs  02/18/17 0441 02/19/17 0521  WBC 17.1* 16.5*  HGB 9.2* 9.3*  HCT 29.1* 28.9*  PLT 372 376   BMET:  Recent Labs  02/18/17 0441 02/19/17 0521  NA 147* 148*  K 3.1* 3.1*  CL 111 112*  CO2 29 28  GLUCOSE 131* 109*  BUN 41* 41*  CREATININE 2.26* 2.30*  CALCIUM 8.6* 8.6*    CMET: Lab Results  Component Value Date   WBC 16.5 (H) 02/19/2017   HGB 9.3 (L) 02/19/2017   HCT 28.9 (L) 02/19/2017   PLT 376 02/19/2017   GLUCOSE 109 (H) 02/19/2017   CHOL 156 02/04/2017   TRIG 104 02/04/2017   HDL 42 02/04/2017   LDLCALC 93 02/04/2017   ALT 67 (H) 02/13/2017   AST 77 (H) 02/13/2017   NA 148 (H) 02/19/2017   K 3.1 (L) 02/19/2017   CL 112 (H) 02/19/2017   CREATININE 2.30 (H) 02/19/2017   BUN 41 (H) 02/19/2017   CO2 28 02/19/2017   TSH 0.576 02/03/2017   INR 2.87 02/19/2017   HGBA1C 6.3 (H) 02/03/2017      PT/INR:  Recent Labs  02/19/17 0521  LABPROT 30.7*  INR 2.87   Radiology: Dg Chest Port 1 View  Result Date: 02/19/2017 CLINICAL DATA:  Shortness of breath.  Pleural effusion . EXAM: PORTABLE CHEST 1 VIEW COMPARISON:  02/16/2017 . FINDINGS: Right PICC line noted in stable position. Prior CABG. Prior cardiac valve replacement. Cardiomegaly with interim improvement pulmonary interstitial edema. Small left pleural effusion. Basilar atelectasis and/or pneumonia. No pneumothorax .  IMPRESSION: 1. Right PICC line in stable position. 2. Prior CABG. Prior cardiac valve replacement. Cardiomegaly with pulmonary vascular congestion. Interim mprovement of pulmonary interstitial edema. Persistent basilar atelectasis and infiltrates. Small left pleural effusion. Interim resolution of right pleural effusion . Electronically Signed   By: Marcello Moores  Register   On: 02/19/2017 07:49     Assessment/Plan: S/P Procedure(s) (LRB): CORONARY ARTERY BYPASS GRAFTING (CABG) x 4                                                                               LIMA-LAD SEQ SVG-DIAG-OM SVG-PD (N/A) TRANSESOPHAGEAL ECHOCARDIOGRAM (TEE) (N/A) MITRAL VALVE REPLACEMENT (MVR) WITH MAGNA MITRAL EASE PERICARDIAL BIOPROSTHESIS 25 MM (N/A)   1 remains in sinus rhythm, some systolic HTN-- not a candidate for ACE-I/ARB currently with creat 2.3.- is used to higher BP for perfusion.  CO-Ox 64 2 sugars controlled 3 K+ being replaced for 3.1 4 leukocytosis improving 5 H/H fairly stable 6 INR 2.8- cont current dose 7 push rehab/pulm toilet 8 D3 diet 9 tx to circle      [name] [name] 02/19/2017 8:08 AM    Chart reviewed, patient examined, agree with above. Creat has stalled at 2.3. She still has significant edema and weight is still 22 lbs over preop. CVP 10 this am. Will continue Demedex for now and follow creat.  Her CXR looks better but still has some left pleural effusion and LLL atelectasis or infiltrate.

## 2017-02-19 NOTE — Progress Notes (Signed)
CARDIAC REHAB PHASE I   PRE:  Rate/Rhythm: 93 Sr  BP:  Supine: 143/79  Sitting:   Standing:    SaO2: 98%RA  MODE:  Ambulation: 150 ft   POST:  Rate/Rhythm: 110 ST  BP:  Supine: 160/92  Sitting:   Standing:    SaO2: 100%RA 1425-1452 Pt has walked twice already but agreed to walk again. Pt walked 150 ft on RA with gait belt use and rolling walker with minimal asst. Tired easily with some DOE. To bed after walk with sats at 100%. Encouraged slow deep breaths.    Graylon Good, RN BSN  02/19/2017 2:49 PM

## 2017-02-20 ENCOUNTER — Encounter
Admission: RE | Admit: 2017-02-20 | Discharge: 2017-02-20 | Disposition: A | Discharge status: Home / Self Care | Payer: Medicare Other | Source: Ambulatory Visit | Attending: Internal Medicine | Admitting: Internal Medicine

## 2017-02-20 DIAGNOSIS — J3801 Paralysis of vocal cords and larynx, unilateral: Secondary | ICD-10-CM

## 2017-02-20 DIAGNOSIS — R490 Dysphonia: Secondary | ICD-10-CM

## 2017-02-20 LAB — BASIC METABOLIC PANEL
Anion gap: 13 (ref 5–15)
BUN: 39 mg/dL — AB (ref 6–20)
CALCIUM: 8.9 mg/dL (ref 8.9–10.3)
CO2: 27 mmol/L (ref 22–32)
CREATININE: 2.37 mg/dL — AB (ref 0.44–1.00)
Chloride: 108 mmol/L (ref 101–111)
GFR calc non Af Amer: 18 mL/min — ABNORMAL LOW (ref >60)
GFR, EST AFRICAN AMERICAN: 21 mL/min — AB (ref >60)
Glucose, Bld: 126 mg/dL — ABNORMAL HIGH (ref 65–99)
Potassium: 3 mmol/L — ABNORMAL LOW (ref 3.5–5.1)
Sodium: 148 mmol/L — ABNORMAL HIGH (ref 135–145)

## 2017-02-20 LAB — CBC
HCT: 29.6 % — ABNORMAL LOW (ref 36.0–46.0)
Hemoglobin: 9.4 g/dL — ABNORMAL LOW (ref 12.0–15.0)
MCH: 24.5 pg — AB (ref 26.0–34.0)
MCHC: 31.8 g/dL (ref 30.0–36.0)
MCV: 77.1 fL — ABNORMAL LOW (ref 78.0–100.0)
PLATELETS: 380 10*3/uL (ref 150–400)
RBC: 3.84 MIL/uL — ABNORMAL LOW (ref 3.87–5.11)
RDW: 24.3 % — AB (ref 11.5–15.5)
WBC: 16.2 10*3/uL — ABNORMAL HIGH (ref 4.0–10.5)

## 2017-02-20 LAB — GLUCOSE, CAPILLARY
GLUCOSE-CAPILLARY: 122 mg/dL — AB (ref 65–99)
GLUCOSE-CAPILLARY: 202 mg/dL — AB (ref 65–99)
Glucose-Capillary: 119 mg/dL — ABNORMAL HIGH (ref 65–99)
Glucose-Capillary: 143 mg/dL — ABNORMAL HIGH (ref 65–99)
Glucose-Capillary: 94 mg/dL (ref 65–99)

## 2017-02-20 LAB — COOXEMETRY PANEL
CARBOXYHEMOGLOBIN: 1.4 % (ref 0.5–1.5)
METHEMOGLOBIN: 0.6 % (ref 0.0–1.5)
O2 SAT: 50.4 %
TOTAL HEMOGLOBIN: 15.7 g/dL (ref 12.0–16.0)

## 2017-02-20 LAB — PROTIME-INR
INR: 4.33 — AB
PROTHROMBIN TIME: 41.4 s — AB (ref 11.4–15.2)

## 2017-02-20 MED ORDER — COUMADIN BOOK
Freq: Once | Status: AC
  Administered 2017-02-20: 10:00:00
  Filled 2017-02-20: qty 1

## 2017-02-20 MED ORDER — POTASSIUM CHLORIDE CRYS ER 20 MEQ PO TBCR
40.0000 meq | EXTENDED_RELEASE_TABLET | Freq: Every day | ORAL | Status: DC
  Administered 2017-02-21 – 2017-02-23 (×3): 40 meq via ORAL
  Filled 2017-02-20 (×4): qty 2

## 2017-02-20 MED ORDER — POTASSIUM CHLORIDE CRYS ER 20 MEQ PO TBCR
40.0000 meq | EXTENDED_RELEASE_TABLET | Freq: Once | ORAL | Status: AC
  Administered 2017-02-20: 40 meq via ORAL
  Filled 2017-02-20: qty 2

## 2017-02-20 MED ORDER — WARFARIN VIDEO
Freq: Once | Status: AC
  Administered 2017-02-20: 10:00:00

## 2017-02-20 MED ORDER — POTASSIUM CHLORIDE CRYS ER 20 MEQ PO TBCR: 40.0000 meq | EXTENDED_RELEASE_TABLET | Freq: Once | ORAL | Status: DC

## 2017-02-20 MED ORDER — NYSTATIN 100000 UNIT/ML MT SUSP
5.0000 mL | Freq: Four times a day (QID) | OROMUCOSAL | Status: DC
  Administered 2017-02-20 – 2017-02-22 (×12): 500000 [IU] via OROMUCOSAL
  Filled 2017-02-20 (×12): qty 5

## 2017-02-20 NOTE — Progress Notes (Signed)
CRITICAL VALUE ALERT  Critical value received:  INR 4.33  Date of notification:  02/20/17  Time of notification:  0500  Critical value read back:Yes.    Nurse who received alert:  Ivin Booty  MD notified (1st page):  Bartle  Time of first page:  0622  MD notified (2nd page):  Time of second page:  Responding MD:  Cyndia Bent  Time MD responded:  [phone]

## 2017-02-20 NOTE — Progress Notes (Addendum)
[address]       [place] [address]             [phone]      15 Days Post-Op Procedure(s) (LRB): CORONARY ARTERY BYPASS GRAFTING (CABG) x 4                                                                               LIMA-LAD SEQ SVG-DIAG-OM SVG-PD (N/A) TRANSESOPHAGEAL ECHOCARDIOGRAM (TEE) (N/A) MITRAL VALVE REPLACEMENT (MVR) WITH MAGNA MITRAL EASE PERICARDIAL BIOPROSTHESIS 25 MM (N/A) Subjective: Feels ok, no new c/o. Is concerned hoarseness isn't getting better  Objective: Vital signs in last 24 hours: Temp:  [97.4 F (36.3 C)-98.9 F (37.2 C)] 97.4 F (36.3 C) (03/02 0506) Pulse Rate:  [93-97] 97 (03/02 0506) Cardiac Rhythm: Normal sinus rhythm (03/01 1930) Resp:  [18-23] 18 (03/02 0506) BP: (136-143)/(84-91) 142/91 (03/02 0506) SpO2:  [93 %-96 %] 93 % (03/02 0506) Weight:  [180 lb 12.8 oz (82 kg)] 180 lb 12.8 oz (82 kg) (03/02 0506)  Hemodynamic parameters for last 24 hours: CVP:  [11 mmHg] 11 mmHg  Intake/Output from previous day: 03/01 0701 - 03/02 0700 In: 1215 [P.O.:840; I.V.:10; IV Piggyback:365] Out: 1050 [Urine:1050] Intake/Output this shift: No intake/output data recorded.  General appearance: alert, cooperative and no distress Heart: irregularly irregular rhythm Lungs: dim left>right lower fields Abdomen: benign Extremities: + pitting edema in LE's unchanged Wound: incis healing well  Lab Results:  Recent Labs  02/19/17 0521 02/20/17 0402  WBC 16.5* 16.2*  HGB 9.3* 9.4*  HCT 28.9* 29.6*  PLT 376 380   BMET:  Recent Labs  02/19/17 0521 02/20/17 0402  NA 148* 148*  K 3.1* 3.0*  CL 112* 108  CO2 28 27  GLUCOSE 109* 126*  BUN 41* 39*  CREATININE 2.30* 2.37*  CALCIUM 8.6* 8.9    PT/INR:  Recent Labs  02/20/17 0402  LABPROT 41.4*  INR 4.33*   ABG    Component Value Date/Time   PHART 7.377 02/09/2017 1603   HCO3 23.0 02/09/2017 1603   TCO2 26 02/11/2017 1618   ACIDBASEDEF 2.0 02/09/2017 1603   O2SAT 50.4 02/20/2017 0428   CBG (last 3)   Recent Labs  02/19/17 2034 02/20/17 0006 02/20/17 0509  GLUCAP 150* 122* 119*    Meds Scheduled Meds: . amiodarone  400 mg Oral BID  . atorvastatin  80 mg Oral q1800  . docusate sodium  200 mg Oral Daily  . insulin aspart  0-15 Units Subcutaneous Q4H  . magic mouthwash  10 mL Oral QID  . mouth rinse  15 mL Mouth Rinse q12n4p  . meropenem (MERREM) IV  500 mg Intravenous Q12H  . moving right along book   Does not apply Once  . pantoprazole (PROTONIX) IV  40 mg Intravenous Q24H  . potassium chloride  20 mEq Oral Daily  . potassium chloride  40 mEq Oral Once  . torsemide  40 mg Oral Daily  . Warfarin - Physician Dosing Inpatient   Does not apply q1800   Continuous Infusions: PRN Meds:.bisacodyl **OR** bisacodyl, hydrALAZINE, ondansetron (ZOFRAN) IV, RESOURCE THICKENUP CLEAR, sodium chloride flush, traMADol  Xrays Dg Chest [facility]  1 View  Result Date: 02/19/2017 CLINICAL DATA:  Shortness of breath.  Pleural effusion . EXAM: PORTABLE CHEST 1 VIEW COMPARISON:  02/16/2017 . FINDINGS: Right PICC line noted in stable position. Prior CABG. Prior cardiac valve replacement. Cardiomegaly with interim improvement pulmonary interstitial edema. Small left pleural effusion. Basilar atelectasis and/or pneumonia. No pneumothorax . IMPRESSION: 1. Right PICC line in stable position. 2. Prior CABG. Prior cardiac valve replacement. Cardiomegaly with pulmonary vascular congestion. Interim mprovement of pulmonary interstitial edema. Persistent basilar atelectasis and infiltrates. Small left pleural effusion. Interim resolution of right pleural effusion . Electronically Signed   By: Marcello Moores  Register   On: 02/19/2017 07:49    Assessment/Plan: S/P Procedure(s) (LRB): CORONARY ARTERY BYPASS GRAFTING (CABG) x 4                                                                               LIMA-LAD SEQ SVG-DIAG-OM SVG-PD (N/A) TRANSESOPHAGEAL ECHOCARDIOGRAM (TEE)  (N/A) MITRAL VALVE REPLACEMENT (MVR) WITH MAGNA MITRAL EASE PERICARDIAL BIOPROSTHESIS 25 MM (N/A)  1 Dr Haroldine Laws to call ENT re: hoarseness, poss vocal cord issue 2 INR super- therapeudic, hold coumadin today 3 cont to replace K+ - creat is stable 4 afib- rate fast at times- cont amio, may need additional neg chronotrope- will defer to AHF team. 5 CO-Ox 50- cont AHF management per team 6 leukocytosis essentially stable- conts meropenem 7 sugars controlled 8 D3 diet 9 rehab/pulm toilet- routine   LOS: 17 days    GOLD,WAYNE E 02/20/2017   Chart reviewed, patient examined, agree with above.  She is stable overall.  Creat is the same, I/O even and wt stable on Demadex 40 daily. Creat may continue to improve over time but may be weeks or months at her age after acute anuric renal failure from shock. Need to avoid dehydration and hypotension. She will be better off with higher BP. Avoid the temptation to hit her with all the usual heart failure meds that drop BP. Co-ox was 50% today but who knows if that is accurate. They frequently bounce around on 2W. I don't think she needs to be back on inotropes.  Her hoarseness is slowly improving. I think this is due to prolonged intubation, advanced age, oral thrush, thickener for dysphagia. It will probably continue to improve. Even if she has some vocal cord dysfunction nothing is going to be done acutely. She can always have ENT evaluation later if it persists.

## 2017-02-20 NOTE — Progress Notes (Addendum)
Patient ID: [name] [name], female   DOB: [birth year], 82 y.o.   MRN: [id number]     Advanced Heart Failure Rounding Note  PCP: Marden Noble, MD Primary Cardiologist: Dr. Yvone Neu [facility]) HF: Dr. Haroldine Laws   Subjective:    S/p emergent CABG x 4 and bioprosthetic MVR for LCX NSTEMI with flail MV on 2/15. Echo with EF 40% and stable MVR  Remains in NSR on po amio   Coox 65%-> 50% this am.   Walked 3 times with nurses and CR yesterday. Needed walker. Denies SOB. Weight stable overnight on po torsemide. Still hoarse but improving  Creatinine stable at 2.3-2.4.   Remains on meropenem. Cx remain negative. Afebrile. WBC falling slowly. INR 4.3    Objective:   Weight Range: 180 lb 12.8 oz (82 kg) Body mass index is 29.18 kg/m.   Vital Signs:   Temp:  [97.4 F (36.3 C)-98.9 F (37.2 C)] 97.4 F (36.3 C) (03/02 0506) Pulse Rate:  [93-97] 97 (03/02 0506) Resp:  [18-23] 18 (03/02 0506) BP: (136-143)/(84-91) 142/91 (03/02 0506) SpO2:  [93 %-96 %] 93 % (03/02 0506) Weight:  [180 lb 12.8 oz (82 kg)] 180 lb 12.8 oz (82 kg) (03/02 0506) Last BM Date: 02/18/17  Weight change: Filed Weights   02/18/17 0500 02/19/17 0500 02/20/17 0506  Weight: 181 lb 3.5 oz (82.2 kg) 180 lb 5.4 oz (81.8 kg) 180 lb 12.8 oz (82 kg)    Intake/Output:   Intake/Output Summary (Last 24 hours) at 02/20/17 0708 Last data filed at 02/20/17 0414  Gross per 24 hour  Intake              950 ml  Output             1050 ml  Net             -100 ml     Physical Exam: General:  Seated in chair. NAD. Remains hoarse.    HEENT: Normal. ? Mild left facial droop and flattening of left nasolabial folds Neck: Supple JVP 9-10, 2+ bilat; no bruits. No lymphadenopathy or thyromegaly appreciated. Cor: PMI nondisplaced. irregular. No M/G/R noted.   Lungs: Diminished basilar sounds.   Abdomen: soft, NT, ND, no HSM. No bruits or masses. +BS  Extremities: no cyanosis, clubbing, rash. RUE PICC. Trace-1+  edema Neuro: A&Ox3 follows commands and is oriented. Moves all 4  Telemetry: Personally reviewed, NSR in 90s.  No AF  Labs: CBC  Recent Labs  02/19/17 0521 02/20/17 0402  WBC 16.5* 16.2*  HGB 9.3* 9.4*  HCT 28.9* 29.6*  MCV 76.5* 77.1*  PLT 376 326   Basic Metabolic Panel  Recent Labs  02/19/17 0521 02/20/17 0402  NA 148* 148*  K 3.1* 3.0*  CL 112* 108  CO2 28 27  GLUCOSE 109* 126*  BUN 41* 39*  CREATININE 2.30* 2.37*  CALCIUM 8.6* 8.9   Liver Function Tests No results for input(s): AST, ALT, ALKPHOS, BILITOT, PROT, ALBUMIN in the last 72 hours. No results for input(s): LIPASE, AMYLASE in the last 72 hours. Cardiac Enzymes No results for input(s): CKTOTAL, CKMB, CKMBINDEX, TROPONINI in the last 72 hours.  BNP: BNP (last 3 results) No results for input(s): BNP in the last 8760 hours.  ProBNP (last 3 results) No results for input(s): PROBNP in the last 8760 hours.   D-Dimer No results for input(s): DDIMER in the last 72 hours. Hemoglobin A1C No results for input(s): HGBA1C in the last 72 hours. Fasting Lipid  Panel No results for input(s): CHOL, HDL, LDLCALC, TRIG, CHOLHDL, LDLDIRECT in the last 72 hours. Thyroid Function Tests No results for input(s): TSH, T4TOTAL, T3FREE, THYROIDAB in the last 72 hours.  Invalid input(s): FREET3  Other results:     Imaging/Studies:  No results found.    Medications:     Scheduled Medications: . amiodarone  400 mg Oral BID  . aspirin  81 mg Oral Daily  . atorvastatin  80 mg Oral q1800  . docusate sodium  200 mg Oral Daily  . insulin aspart  0-15 Units Subcutaneous Q4H  . magic mouthwash  10 mL Oral QID  . mouth rinse  15 mL Mouth Rinse q12n4p  . meropenem (MERREM) IV  500 mg Intravenous Q12H  . moving right along book   Does not apply Once  . pantoprazole (PROTONIX) IV  40 mg Intravenous Q24H  . potassium chloride  20 mEq Oral Daily  . torsemide  40 mg Oral Daily  . Warfarin - Physician Dosing  Inpatient   Does not apply q1800    Infusions:   PRN Medications: bisacodyl **OR** bisacodyl, hydrALAZINE, ondansetron (ZOFRAN) IV, RESOURCE THICKENUP CLEAR, sodium chloride flush, traMADol   Assessment/Plan   1. CAD [name] on 2/15 2. Severe MR with flail segment s/p MVR c/ bioprosthetic valve. 3. Cardiogenic shock: Echo pre-op with EF 40-45%, lateral akinesis, severe MR.     --Echo 2/22 EF 40% MVR ok 4. AKI 5. Shock Liver 6. Acute respiratory failure with hypoxemia requiring intubation 7. Thrombocytopenia: Post-op. 8. Tracheal aspirate with Strep pneumoniae 9. PAF 10. Leukocytosis 11. Hypokalemia  Co-ox down this am off dopamine but not sure it is accurate. Overall clinically stable and progressing slowly.  Remains volume overloaded about 22 pounds up from baseline but mostly 3rd spacing and has been somewhat refractory to diuretcs and we have been proceeding cautiously so as not to worsen her renal function. If creatinine stable can try one dose on metolazone over the weekend. Would not restart milrinone at this point unless co-ox continues to drop.   Maintaining NSR on po amio. Will continue. Drop to 200 bid on Monday. Holding warfarin for supratherapeutic INR  Off ASA with coumadin. Continue statin. Can start b-blocker when co-ox improved.   K low - will supp.  WBC continues to come down. Continue meropenem. Cx data unremarkable.   Continue to mobilize.  Will ask ENT to evaluate vocal cords due to hoarseness.  Mild left facial droop with flattening of left naso-labial folds. Will follow. Can CT head as needed.    Glori Bickers, MD  7:08 AM  Advanced Heart Failure Team Pager [phone] (M-F; 7a - 4p)  Please contact [facility] for night-coverage after hours (4p -7a ) and weekends on [email or web address]

## 2017-02-20 NOTE — Progress Notes (Signed)
Pharmacy Antibiotic Note  [name] [name] is a 82 y.o. female admitted on 02/03/2017 with pneumonia. Patient with strep pneumo bronchitis on vanc/zosyn however not improving so vanc/zosyn dc'd and switched to meropenem on 2/25. Serum creatinine is elevated but has been stable over the last several days. Dose remains appropriate.   Plan: 1) Continue meropenem 500mg  IV q12 2) Follow up LOT   Height: 5\' 6"  (167.6 cm) Weight: 180 lb 12.8 oz (82 kg) IBW/kg (Calculated) : 59.3  Temp (24hrs), Avg:97.9 F (36.6 C), Min:97.4 F (36.3 C), Max:98.9 F (37.2 C)   Recent Labs Lab 02/16/17 0415 02/17/17 0415 02/17/17 1700 02/18/17 0441 02/19/17 0521 02/20/17 0402  WBC 22.7* 19.4*  --  17.1* 16.5* 16.2*  CREATININE 3.12* 2.48* 2.25* 2.26* 2.30* 2.37*    Estimated Creatinine Clearance: 20.8 mL/min (by C-G formula based on SCr of 2.37 mg/dL (H)).    No Known Allergies    Antimicrobials this admission:  CTX 2/17 >>2/22 Vanc 2/22>>2/25 Zosyn 2/22>>2/25 Meropenem 2/25>>  Dose adjustments this admission:  2/23 zosyn down to 3.375 q12 2/26 meropenem down to 500 q 12hr  Microbiology results:  2/22 BCx: ngtd 2/22 Urine:ng 2/17 UCx: neg 2/15 TA: strep pneumo S rocephin 2/14 MRSA PCR: neg  Thank you for allowing pharmacy to be a part of this patient's care.  [address] Pharm D, BCPS   02/20/2017 10:13 AM

## 2017-02-20 NOTE — Care Management Important Message (Signed)
Important Message  Patient Details  Name: [name] [name] MRN: [id number] Date of Birth: 1935/01/23   Medicare Important Message Given:  Yes    Nathen May 02/20/2017, 12:37 PM

## 2017-02-20 NOTE — Discharge Instructions (Addendum)
Heart Failure Heart failure means your heart has trouble pumping blood. This makes it hard for your body to work well. Heart failure is usually a long-term (chronic) condition. You must take good care of yourself and follow your doctor's treatment plan. Follow these instructions at home:  Take your heart medicine as told by your doctor.  Do not stop taking medicine unless your doctor tells you to.  Do not skip any dose of medicine.  Refill your medicines before they run out.  Take other medicines only as told by your doctor or pharmacist.  Stay active if told by your doctor. The elderly and people with severe heart failure should talk with a doctor about physical activity.  Eat heart-healthy foods. Choose foods that are without trans fat and are low in saturated fat, cholesterol, and salt (sodium). This includes fresh or frozen fruits and vegetables, fish, lean meats, fat-free or low-fat dairy foods, whole grains, and high-fiber foods. Lentils and dried peas and beans (legumes) are also good choices.  Limit salt if told by your doctor.  Cook in a healthy way. Roast, grill, broil, bake, poach, steam, or stir-fry foods.  Limit fluids as told by your doctor.  Weigh yourself every morning. Do this after you pee (urinate) and before you eat breakfast. Write down your weight to give to your doctor.  Take your blood pressure and write it down if your doctor tells you to.  Ask your doctor how to check your pulse. Check your pulse as told.  Lose weight if told by your doctor.  Stop smoking or chewing tobacco. Do not use gum or patches that help you quit without your doctor's approval.  Schedule and go to doctor visits as told.  Nonpregnant women should have no more than 1 drink a day. Men should have no more than 2 drinks a day. Talk to your doctor about drinking alcohol.  Stop illegal drug use.  Stay current with shots (immunizations).  Manage your health conditions as told by your  doctor.  Learn to manage your stress.  Rest when you are tired.  If it is really hot outside:  Avoid intense activities.  Use air conditioning or fans, or get in a cooler place.  Avoid caffeine and alcohol.  Wear loose-fitting, lightweight, and light-colored clothing.  If it is really cold outside:  Avoid intense activities.  Layer your clothing.  Wear mittens or gloves, a hat, and a scarf when going outside.  Avoid alcohol.  Learn about heart failure and get support as needed.  Get help to maintain or improve your quality of life and your ability to care for yourself as needed. Contact a doctor if:  You gain weight quickly.  You are more short of breath than usual.  You cannot do your normal activities.  You tire easily.  You cough more than normal, especially with activity.  You have any or more puffiness (swelling) in areas such as your hands, feet, ankles, or belly (abdomen).  You cannot sleep because it is hard to breathe.  You feel like your heart is beating fast (palpitations).  You get dizzy or light-headed when you stand up. Get help right away if:  You have trouble breathing.  There is a change in mental status, such as becoming less alert or not being able to focus.  You have chest pain or discomfort.  You faint. This information is not intended to replace advice given to you by your health care provider. Make sure you  discuss any questions you have with your health care provider. Document Released: 09/16/2008 Document Revised: 05/15/2016 Document Reviewed: 01/24/2013 Elsevier Interactive Patient Education  2017 [employer] Mitral Valve Replacement, Care After This sheet gives you information about how to care for yourself after your procedure. Your health care provider may also give you more specific instructions. If you have problems or questions, contact your health care provider. What can I expect after the procedure? After the  procedure, it is common to have:  Pain at the incision area that may last for several weeks. Follow these instructions at home: Incision care   Follow instructions from your health care provider about how to take care of your incision. Make sure you:  Wash your hands with soap and water before you change your bandage (dressing). If soap and water are not available, use hand sanitizer.  Change your dressing as told by your health care provider.  Leave stitches (sutures), skin glue, or adhesive strips in place. These skin closures may need to stay in place for 2 weeks or longer. If adhesive strip edges start to loosen and curl up, you may trim the loose edges. Do not remove adhesive strips completely unless your health care provider tells you to do that.  Check your incision area every day for signs of infection. Check for:  More redness, swelling, or pain.  More fluid or blood.  Warmth.  Pus or a bad smell.   Do not apply powder or lotion to the area. Driving   Do not drive until your health care provider approves.  Do not drive or use heavy machinery while taking prescription pain medicines. Bathing   Do not take baths, swim, or use a hot tub for 2-4 weeks after surgery, or until your health care provider approves. Ask your health care provider if you may take showers.  To wash the incision site, gently wash with soap and water and pat the area dry with a clean towel. Do not rub the incision area. That may cause bleeding. Activity   Rest as told by your health care provider. Ask your health care provider when you can resume normal activities, including sexual activity.  Avoid the following activities for 6-8 weeks, or as long as directed:  Lifting anything that is heavier than 10 lb (4.5 kg), or the limit that your health care provider tells you.  Pushing or pulling things with your arms.  Avoid climbing stairs and using the handrail to pull yourself up for the first 2-3  weeks after surgery.  Avoid airplane travel for 4-6 weeks, or as long as directed.  Avoid sitting for long periods of time and crossing your legs. Get up and move around at least once every 1-2 hours.  If you are taking blood thinners (anticoagulants), avoid activities that have a high risk of injury. Ask your health care provider what activities are safe for you. Lifestyle   Limit alcohol intake to no more than 1 drink a day for nonpregnant women and 2 drinks a day for men. One drink equals 12 oz of beer, 5 oz of wine, or 1 oz of hard liquor.  Do not use any products that contain nicotine or tobacco, such as cigarettes and e-cigarettes. If you need help quitting, ask your health care provider. General instructions   Take your temperature every day and weigh yourself every morning for the first 7 days after surgery. Write your temperatures and weight down and take this record with you to  any follow-up visits.  Take over-the-counter and prescription medicines only as told by your health care provider.  To prevent or treat constipation while you are taking prescription pain medicine, your health care provider may recommend that you:  Drink enough fluid to keep your urine clear or pale yellow.  Take over-the-counter or prescription medicines.  Eat foods that are high in fiber, such as fresh fruits and vegetables, whole grains, and beans.  Limit foods that are high in fat and processed sugars, such as fried and sweet foods.  Follow instructions from your health care provider about eating or drinking restrictions.  Wear compression stockings for at least 2 weeks, or as long as told by your health care provider. These stockings help to prevent blood clots and reduce swelling in your legs. If your ankles are swollen after 2 weeks, continue to wear the stockings.  Keep all follow-up visits as told by your health care provider. This is important. Contact a health care provider if:  You  develop a skin rash.  Your weight is increasing each day over 2-3 days.  You gain 2 lb (1 kg) or more in a single day.  You have a fever. Get help right away if:  You develop chest pain that feels different from the pain caused by your incision.  You develop shortness of breath or difficulty breathing.  You have more redness, swelling, or pain around your incision.  You have more fluid or blood coming from your incision.  Your incision feels warm to the touch.  You have pus or a bad smell coming from your incision.  You feel light-headed. This information is not intended to replace advice given to you by your health care provider. Make sure you discuss any questions you have with your health care provider. Document Released: 06/27/2005 Document Revised: 09/19/2016 Document Reviewed: 09/19/2016 Elsevier Interactive Patient Education  2017 [employer] Coronary Artery Bypass Grafting, Care After This sheet gives you information about how to care for yourself after your procedure. Your health care provider may also give you more specific instructions. If you have problems or questions, contact your health care provider. What can I expect after the procedure? After the procedure, it is common to have:  Nausea and a lack of appetite.  Constipation.  Weakness and fatigue.  Depression or irritability.  Pain or discomfort in your incision areas. Follow these instructions at home: Medicines   Take over-the-counter and prescription medicines only as told by your health care provider. Do not stop taking medicines or start any new medicines without approval from your health care provider.  If you were prescribed an antibiotic medicine, take it as told by your health care provider. Do not stop taking the antibiotic even if you start to feel better.  Do not drive or use heavy machinery while taking prescription pain medicine. Incision care   Follow instructions from your health  care provider about how to take care of your incisions. Make sure you:  Wash your hands with soap and water before you change your bandage (dressing). If soap and water are not available, use hand sanitizer.  Change your dressing as told by your health care provider.  Leave stitches (sutures), skin glue, or adhesive strips in place. These skin closures may need to stay in place for 2 weeks or longer. If adhesive strip edges start to loosen and curl up, you may trim the loose edges. Do not remove adhesive strips completely unless your health care provider tells  you to do that.  Keep incision areas clean, dry, and protected.  Check your incision areas every day for signs of infection. Check for:  More redness, swelling, or pain.  More fluid or blood.  Warmth.  Pus or a bad smell.  If incisions were made in your legs:  Avoid crossing your legs.  Avoid sitting for long periods of time. Change positions every 30 minutes.  Raise (elevate) your legs when you are sitting. Bathing   Do not take baths, swim, or use a hot tub until your health care provider approves.  Only take sponge baths. Pat the incisions dry. Do not rub incisions with a washcloth or towel.  Ask your health care provider when you can shower. Eating and drinking   Eat foods that are high in fiber, such as raw fruits and vegetables, whole grains, beans, and nuts. Meats should be lean cut. Avoid canned, processed, and fried foods. This can help prevent constipation and is a recommended part of a heart-healthy diet.  Drink enough fluid to keep your urine clear or pale yellow.  Limit alcohol intake to no more than 1 drink a day for nonpregnant women and 2 drinks a day for men. One drink equals 12 oz of beer, 5 oz of wine, or 1 oz of hard liquor. Activity   Rest and limit your activity as told by your health care provider. You may be instructed to:  Stop any activity right away if you have chest pain, shortness of  breath, irregular heartbeats, or dizziness. Get help right away if you have any of these symptoms.  Move around frequently for short periods or take short walks as directed by your health care provider. Gradually increase your activities. You may need physical therapy or cardiac rehabilitation to help strengthen your muscles and build your endurance.  Avoid lifting, pushing, or pulling anything that is heavier than 10 lb (4.5 kg) for at least 6 weeks or as told by your health care provider.  Do not drive until your health care provider approves.  Ask your health care provider when you may return to work.  Ask your health care provider when you may resume sexual activity. General instructions   Do not use any products that contain nicotine or tobacco, such as cigarettes and e-cigarettes. If you need help quitting, ask your health care provider.  Take 2-3 deep breaths every few hours during the day, while you recover. This helps expand your lungs and prevent complications like pneumonia after surgery.  If you were given a device called an incentive spirometer, use it several times a day to practice deep breathing. Support your chest with a pillow or your arms when you take deep breaths or cough.  Wear compression stockings as told by your health care provider. These stockings help to prevent blood clots and reduce swelling in your legs.  Weigh yourself every day. This helps identify if your body is holding (retaining) fluid that may make your heart and lungs work harder.  Keep all follow-up visits as told by your health care provider. This is important. Contact a health care provider if:  You have more redness, swelling, or pain around any incision.  You have more fluid or blood coming from any incision.  Any incision feels warm to the touch.  You have pus or a bad smell coming from any incision  You have a fever.  You have swelling in your ankles or legs.  You have pain in your  legs.  You gain 2 lb (0.9 kg) or more a day.  You are nauseous or you vomit.  You have diarrhea. Get help right away if:  You have chest pain that spreads to your jaw or arms.  You are short of breath.  You have a fast or irregular heartbeat.  You notice a "clicking" in your breastbone (sternum) when you move.  You have numbness or weakness in your arms or legs.  You feel dizzy or light-headed. Summary  After the procedure, it is common to have pain or discomfort in the incision areas.  Do not take baths, swim, or use a hot tub until your health care provider approves.  Gradually increase your activities. You may need physical therapy or cardiac rehabilitation to help strengthen your muscles and build your endurance.  Weigh yourself every day. This helps identify if your body is holding (retaining) fluid that may make your heart and lungs work harder. This information is not intended to replace advice given to you by your health care provider. Make sure you discuss any questions you have with your health care provider. Document Released: 06/27/2005 Document Revised: 10/27/2016 Document Reviewed: 10/27/2016 Elsevier Interactive Patient Education  2017 [place] on my medicine - Coumadin   (Warfarin)  This medication education was reviewed with me or my healthcare representative as part of my discharge preparation.  The pharmacist that spoke with me during my hospital stay was:  Deboraha Sprang, [facility]  Why was Coumadin prescribed for you? Coumadin was prescribed for you because you have a blood clot or a medical condition that can cause an increased risk of forming blood clots. Blood clots can cause serious health problems by blocking the flow of blood to the heart, lung, or brain. Coumadin can prevent harmful blood clots from forming. As a reminder your indication for Coumadin is:   Stroke Prevention Because Of Atrial Fibrillation  What test will check on  my response to Coumadin? While on Coumadin (warfarin) you will need to have an INR test regularly to ensure that your dose is keeping you in the desired range. The INR (international normalized ratio) number is calculated from the result of the laboratory test called prothrombin time (PT).  If an INR APPOINTMENT HAS NOT ALREADY BEEN MADE FOR YOU please schedule an appointment to have this lab work done by your health care provider within 7 days. Your INR goal is usually a number between:  2 to 3 or your provider may give you a more narrow range like 2-2.5.  Ask your health care provider during an office visit what your goal INR is.  What  do you need to  know  About  COUMADIN? Take Coumadin (warfarin) exactly as prescribed by your healthcare provider about the same time each day.  DO NOT stop taking without talking to the doctor who prescribed the medication.  Stopping without other blood clot prevention medication to take the place of Coumadin may increase your risk of developing a new clot or stroke.  Get refills before you run out.  What do you do if you miss a dose? If you miss a dose, take it as soon as you remember on the same day then continue your regularly scheduled regimen the next day.  Do not take two doses of Coumadin at the same time.  Important Safety Information A possible side effect of Coumadin (Warfarin) is an increased risk of bleeding. You should call your healthcare provider right away if  you experience any of the following: ? Bleeding from an injury or your nose that does not stop. ? Unusual colored urine (red or dark brown) or unusual colored stools (red or black). ? Unusual bruising for unknown reasons. ? A serious fall or if you hit your head (even if there is no bleeding).  Some foods or medicines interact with Coumadin (warfarin) and might alter your response to warfarin. To help avoid this: ? Eat a balanced diet, maintaining a consistent amount of Vitamin  K. ? Notify your provider about major diet changes you plan to make. ? Avoid alcohol or limit your intake to 1 drink for women and 2 drinks for men per day. (1 drink is 5 oz. wine, 12 oz. beer, or 1.5 oz. liquor.)  Make sure that ANY health care provider who prescribes medication for you knows that you are taking Coumadin (warfarin).  Also make sure the healthcare provider who is monitoring your Coumadin knows when you have started a new medication including herbals and non-prescription products.  Coumadin (Warfarin)  Major Drug Interactions  Increased Warfarin Effect Decreased Warfarin Effect  Alcohol (large quantities) Antibiotics (esp. Septra/Bactrim, Flagyl, Cipro) Amiodarone (Cordarone) Aspirin (ASA) Cimetidine (Tagamet) Megestrol (Megace) NSAIDs (ibuprofen, naproxen, etc.) Piroxicam (Feldene) Propafenone (Rythmol SR) Propranolol (Inderal) Isoniazid (INH) Posaconazole (Noxafil) Barbiturates (Phenobarbital) Carbamazepine (Tegretol) Chlordiazepoxide (Librium) Cholestyramine (Questran) Griseofulvin Oral Contraceptives Rifampin Sucralfate (Carafate) Vitamin K   Coumadin (Warfarin) Major Herbal Interactions  Increased Warfarin Effect Decreased Warfarin Effect  Garlic Ginseng Ginkgo biloba Coenzyme Q10 Green tea St. Johns wort    Coumadin (Warfarin) FOOD Interactions  Eat a consistent number of servings per week of foods HIGH in Vitamin K (1 serving =  cup)  Collards (cooked, or boiled & drained) Kale (cooked, or boiled & drained) Mustard greens (cooked, or boiled & drained) Parsley *serving size only =  cup Spinach (cooked, or boiled & drained) Swiss chard (cooked, or boiled & drained) Turnip greens (cooked, or boiled & drained)  Eat a consistent number of servings per week of foods MEDIUM-HIGH in Vitamin K (1 serving = 1 cup)  Asparagus (cooked, or boiled & drained) Broccoli (cooked, boiled & drained, or raw & chopped) Brussel sprouts (cooked, or boiled &  drained) *serving size only =  cup Lettuce, raw (green leaf, endive, romaine) Spinach, raw Turnip greens, raw & chopped   These websites have more information on Coumadin (warfarin):  FailFactory.se; VeganReport.com.au;

## 2017-02-20 NOTE — Progress Notes (Signed)
CARDIAC REHAB PHASE I   PRE:  Rate/Rhythm: 93 SR  BP:  Supine:   Sitting: 134/85  Standing:    SaO2: 94%RA  MODE:  Ambulation: 150 ft   POST:  Rate/Rhythm: 112 ST runs of PACs and some PVCs  BP:  Supine:   Sitting: 167/89  Standing:    SaO2: 92-93%RA [id number] Pt stated she does not feel as well today. Pt walked 150 ft on RA with gait belt use, rolling walker and asst x 1. Had pt stop twice and do purse lip breathing as she was so dyspneic. Returned to room and to recliner . Pt stated she could feel heart beating fast. She was having long runs of PACs with normal beats. She felt better after a couple of minutes of rest. She was very SOB. Sats at 92-93%. Took a few minutes to recover.   Graylon Good, RN BSN  02/20/2017 10:21 AM

## 2017-02-20 NOTE — Consult Note (Signed)
Reason for Consult: Hoarseness  HPI:  [name] [name] is an 82 y.o. female who underwent CABG and mitral valve replacement 15 days ago. According to the patient, she has been hoarse since the surgery. She c/o occasional complete loss of her voice. The severity of her hoarseness has not changed since the surgery. Her recent modified barium swallow study showed moderate pharyngeal dysphagia. She is currently on mechanical soft diet with honey thick liquids. She has no previous history of ENT surgery.  Past Medical History:  Diagnosis Date  . [name]    a. LHC 02/03/17 for unstable angina showed severe 3v CAD w/ sequential 70% & 80% p-mLAD, 99% mLCx, CTO mRCA, elevated LV filling pressure  . Colon polyp 2010  . Diabetes mellitus with complication ([place])   . Glaucoma   . Hypertension     Past Surgical History:  Procedure Laterality Date  . APPENDECTOMY    . BREAST BIOPSY Right 1999  . COLONOSCOPY  2010  . CORONARY ARTERY BYPASS GRAFT N/A 02/05/2017   Procedure: CORONARY ARTERY BYPASS GRAFTING (CABG) x 4                                                                               LIMA-LAD SEQ SVG-DIAG-OM SVG-PD;  Surgeon: Gaye Pollack, MD;  Location: [place] [facility];  Service: Open Heart Surgery;  Laterality: N/A;  . IABP INSERTION N/A 02/04/2017   Procedure: IABP Insertion;  Surgeon: Leonie Man, MD;  Location: [place] [facility];  Service: Cardiovascular;  Laterality: N/A;  . LEFT HEART CATH AND CORONARY ANGIOGRAPHY Left 02/03/2017   Procedure: Left Heart Cath and Coronary Angiography;  Surgeon: Nelva Bush, MD;  Location: [place] [facility];  Service: Cardiovascular;  Laterality: Left;  . MITRAL VALVE REPAIR N/A 02/05/2017   Procedure: MITRAL VALVE REPLACEMENT (MVR) WITH MAGNA MITRAL EASE PERICARDIAL BIOPROSTHESIS 25 MM;  Surgeon: Gaye Pollack, MD;  Location: [place] [facility];  Service: Open Heart Surgery;  Laterality: N/A;  . PARATHYROIDECTOMY     1986  . RIGHT HEART CATH N/A  02/04/2017   Procedure: Right Heart Cath;  Surgeon: Leonie Man, MD;  Location: [place] [facility];  Service: Cardiovascular;  Laterality: N/A;  . TEE WITHOUT CARDIOVERSION N/A 02/05/2017   Procedure: TRANSESOPHAGEAL ECHOCARDIOGRAM (TEE);  Surgeon: Gaye Pollack, MD;  Location: [place];  Service: Open Heart Surgery;  Laterality: N/A;    Family History  Problem Relation Age of Onset  . Diabetes Father   . Diabetes Sister     Social History:  reports that she has never smoked. She has never used smokeless tobacco. She reports that she does not drink alcohol or use drugs.  Allergies: No Known Allergies  Prior to Admission medications   Medication Sig Start Date End Date Taking? Authorizing Provider  acetaminophen (TYLENOL) 500 MG tablet Take 500 mg by mouth every 6 (six) hours as needed for mild pain.    Yes Historical Provider, MD  Calcium Carbonate-Vitamin D (CALCIUM + D PO) Take 1 tablet by mouth daily.   Yes Historical Provider, MD  latanoprost (XALATAN) 0.005 % ophthalmic solution Place 1 drop into both eyes at bedtime.  Yes Historical Provider, MD  olmesartan-hydrochlorothiazide (BENICAR HCT) 40-12.5 MG per tablet Take 1 tablet by mouth daily.   Yes Historical Provider, MD  ONGLYZA 5 MG TABS tablet Take 1 tablet by mouth daily. 08/06/14  Yes Historical Provider, MD  VIMOVO 500-20 MG TBEC Take 1 tablet by mouth daily. 07/14/14  Yes Historical Provider, MD  naproxen (NAPROSYN) 375 MG tablet Take 1 tablet (375 mg total) by mouth 2 (two) times daily. Patient not taking: Reported on 02/03/2017 06/18/15   Sherlene Shams, MD  polyethylene glycol powder [facility]) powder 255 grams one bottle for colonoscopy prep Patient not taking: Reported on 02/03/2017 09/18/14   Robert Bellow, MD    Medications:  I have reviewed the patient's current medications. Scheduled: . amiodarone  400 mg Oral BID  . atorvastatin  80 mg Oral q1800  . insulin aspart  0-15 Units Subcutaneous Q4H  .  mouth rinse  15 mL Mouth Rinse q12n4p  . meropenem (MERREM) IV  500 mg Intravenous Q12H  . moving right along book   Does not apply Once  . nystatin  5 mL Mouth/Throat QID  . pantoprazole (PROTONIX) IV  40 mg Intravenous Q24H  . potassium chloride  40 mEq Oral Daily  . torsemide  40 mg Oral Daily  . Warfarin - Physician Dosing Inpatient   Does not apply q1800   FBP:ZWCHENIDPOE, ondansetron (ZOFRAN) IV, RESOURCE THICKENUP CLEAR, sodium chloride flush, traMADol  Results for orders placed or performed during the hospital encounter of 02/03/17 (from the past 48 hour(s))  Glucose, capillary     Status: Abnormal   Collection Time: 02/18/17  9:08 PM  Result Value Ref Range   Glucose-Capillary 118 (H) 65 - 99 mg/dL   Comment 1 Venous Specimen   Glucose, capillary     Status: Abnormal   Collection Time: 02/18/17 11:48 PM  Result Value Ref Range   Glucose-Capillary 130 (H) 65 - 99 mg/dL   Comment 1 Capillary Specimen    Comment 2 Notify RN    Comment 3 Document in Chart   Glucose, capillary     Status: Abnormal   Collection Time: 02/19/17  2:08 AM  Result Value Ref Range   Glucose-Capillary 106 (H) 65 - 99 mg/dL   Comment 1 Venous Specimen   .Cooxemetry Panel (carboxy, met, total hgb, O2 sat)     Status: Abnormal   Collection Time: 02/19/17  4:53 AM  Result Value Ref Range   Total hemoglobin 9.5 (L) 12.0 - 16.0 g/dL   O2 Saturation 64.9 %   Carboxyhemoglobin 1.3 0.5 - 1.5 %   Methemoglobin 1.0 0.0 - 1.5 %  Glucose, capillary     Status: Abnormal   Collection Time: 02/19/17  4:53 AM  Result Value Ref Range   Glucose-Capillary 113 (H) 65 - 99 mg/dL   Comment 1 Venous Specimen   Protime-INR     Status: Abnormal   Collection Time: 02/19/17  5:21 AM  Result Value Ref Range   Prothrombin Time 30.7 (H) 11.4 - 15.2 seconds   INR 4.23   Basic metabolic panel     Status: Abnormal   Collection Time: 02/19/17  5:21 AM  Result Value Ref Range   Sodium 148 (H) 135 - 145 mmol/L   Potassium  3.1 (L) 3.5 - 5.1 mmol/L   Chloride 112 (H) 101 - 111 mmol/L   CO2 28 22 - 32 mmol/L   Glucose, Bld 109 (H) 65 - 99 mg/dL   BUN 41 (  H) 6 - 20 mg/dL   Creatinine, Ser 2.30 (H) 0.44 - 1.00 mg/dL   Calcium 8.6 (L) 8.9 - 10.3 mg/dL   GFR calc non Af Amer 19 (L) >60 mL/min   GFR calc Af Amer 22 (L) >60 mL/min    Comment: (NOTE) The eGFR has been calculated using the CKD EPI equation. This calculation has not been validated in all clinical situations. eGFR's persistently <60 mL/min signify possible Chronic Kidney Disease.    Anion gap 8 5 - 15  CBC     Status: Abnormal   Collection Time: 02/19/17  5:21 AM  Result Value Ref Range   WBC 16.5 (H) 4.0 - 10.5 K/uL   RBC 3.78 (L) 3.87 - 5.11 MIL/uL   Hemoglobin 9.3 (L) 12.0 - 15.0 g/dL   HCT 28.9 (L) 36.0 - 46.0 %   MCV 76.5 (L) 78.0 - 100.0 fL   MCH 24.6 (L) 26.0 - 34.0 pg   MCHC 32.2 30.0 - 36.0 g/dL   RDW 23.8 (H) 11.5 - 15.5 %   Platelets 376 150 - 400 K/uL  Glucose, capillary     Status: Abnormal   Collection Time: 02/19/17  8:40 AM  Result Value Ref Range   Glucose-Capillary 139 (H) 65 - 99 mg/dL   Comment 1 Capillary Specimen    Comment 2 Notify RN   Glucose, capillary     Status: Abnormal   Collection Time: 02/19/17 11:02 AM  Result Value Ref Range   Glucose-Capillary 173 (H) 65 - 99 mg/dL   Comment 1 Notify RN    Comment 2 Document in Chart   Glucose, capillary     Status: Abnormal   Collection Time: 02/19/17  4:10 PM  Result Value Ref Range   Glucose-Capillary 124 (H) 65 - 99 mg/dL  Glucose, capillary     Status: Abnormal   Collection Time: 02/19/17  8:34 PM  Result Value Ref Range   Glucose-Capillary 150 (H) 65 - 99 mg/dL  Glucose, capillary     Status: Abnormal   Collection Time: 02/20/17 12:06 AM  Result Value Ref Range   Glucose-Capillary 122 (H) 65 - 99 mg/dL  Protime-INR     Status: Abnormal   Collection Time: 02/20/17  4:02 AM  Result Value Ref Range   Prothrombin Time 41.4 (H) 11.4 - 15.2 seconds   INR  4.33 (HH)     Comment: REPEATED TO VERIFY CRITICAL RESULT CALLED TO, READ BACK BY AND VERIFIED WITH: [facility] S RN AT 0510 ON 03.02.2018 BY COCHRANE S   CBC     Status: Abnormal   Collection Time: 02/20/17  4:02 AM  Result Value Ref Range   WBC 16.2 (H) 4.0 - 10.5 K/uL   RBC 3.84 (L) 3.87 - 5.11 MIL/uL   Hemoglobin 9.4 (L) 12.0 - 15.0 g/dL   HCT 29.6 (L) 36.0 - 46.0 %   MCV 77.1 (L) 78.0 - 100.0 fL   MCH 24.5 (L) 26.0 - 34.0 pg   MCHC 31.8 30.0 - 36.0 g/dL   RDW 24.3 (H) 11.5 - 15.5 %   Platelets 380 150 - 400 K/uL  Basic metabolic panel     Status: Abnormal   Collection Time: 02/20/17  4:02 AM  Result Value Ref Range   Sodium 148 (H) 135 - 145 mmol/L   Potassium 3.0 (L) 3.5 - 5.1 mmol/L   Chloride 108 101 - 111 mmol/L   CO2 27 22 - 32 mmol/L   Glucose, Bld 126 (H) 65 -  99 mg/dL   BUN 39 (H) 6 - 20 mg/dL   Creatinine, Ser 2.37 (H) 0.44 - 1.00 mg/dL   Calcium 8.9 8.9 - 10.3 mg/dL   GFR calc non Af Amer 18 (L) >60 mL/min   GFR calc Af Amer 21 (L) >60 mL/min    Comment: (NOTE) The eGFR has been calculated using the CKD EPI equation. This calculation has not been validated in all clinical situations. eGFR's persistently <60 mL/min signify possible Chronic Kidney Disease.    Anion gap 13 5 - 15  .Cooxemetry Panel (carboxy, met, total hgb, O2 sat)     Status: None   Collection Time: 02/20/17  4:28 AM  Result Value Ref Range   Total hemoglobin 15.7 12.0 - 16.0 g/dL   O2 Saturation 50.4 %   Carboxyhemoglobin 1.4 0.5 - 1.5 %   Methemoglobin 0.6 0.0 - 1.5 %  Glucose, capillary     Status: Abnormal   Collection Time: 02/20/17  5:09 AM  Result Value Ref Range   Glucose-Capillary 119 (H) 65 - 99 mg/dL  Glucose, capillary     Status: Abnormal   Collection Time: 02/20/17 11:17 AM  Result Value Ref Range   Glucose-Capillary 202 (H) 65 - 99 mg/dL   Comment 1 Notify RN    Comment 2 Document in Chart   Glucose, capillary     Status: Abnormal   Collection Time: 02/20/17  4:11 PM   Result Value Ref Range   Glucose-Capillary 143 (H) 65 - 99 mg/dL    Dg Chest Port 1 View  Result Date: 02/19/2017 CLINICAL DATA:  Shortness of breath.  Pleural effusion . EXAM: PORTABLE CHEST 1 VIEW COMPARISON:  02/16/2017 . FINDINGS: Right PICC line noted in stable position. Prior CABG. Prior cardiac valve replacement. Cardiomegaly with interim improvement pulmonary interstitial edema. Small left pleural effusion. Basilar atelectasis and/or pneumonia. No pneumothorax . IMPRESSION: 1. Right PICC line in stable position. 2. Prior CABG. Prior cardiac valve replacement. Cardiomegaly with pulmonary vascular congestion. Interim mprovement of pulmonary interstitial edema. Persistent basilar atelectasis and infiltrates. Small left pleural effusion. Interim resolution of right pleural effusion . Electronically Signed   By: Marcello Moores  Register   On: 02/19/2017 07:49   Review of Systems: Review of Systems  Constitutional: Negative for chills, diaphoresis, fever and weight loss.  HENT: Hoarseness as noted above. Eyes: Negative.   Respiratory: Negative for cough, hemoptysis, sputum production and wheezing.   Cardiovascular: Recent chest pain. Negative for palpitations, orthopnea, claudication, leg swelling and PND.  Gastrointestinal: Negative for abdominal pain, blood in stool, heartburn, melena, nausea and vomiting.  Genitourinary: Negative.   Musculoskeletal: Negative for falls and myalgias.  Skin: Negative.   Neurological: Negative for dizziness, tingling, tremors, sensory change,  focal weakness and loss of consciousness.  Endo/Heme/Allergies: Negative.   Psychiatric/Behavioral: Negative for substance abuse. The patient is not nervous/anxious.  Blood pressure (!) 141/83, pulse 96, temperature 98.2 F (36.8 C), temperature source Oral, resp. rate 18, height _0  (1.676 m), weight 180 lb 12.8 oz (82 kg), SpO2 98 %. General: Well developed, well nourished, in no acute distress. Head: Normocephalic,  atraumatic Eyes: PERRL, EOMI. Ears: Normal auricles and EACs. Nose: Normal mucosa and septum. Mouth: Normal mucosa. No lesion. No trismus.  Voice is hoarse and breathy. No stridor.        Lungs: Clear bilaterally to auscultation without wheezes, rales, or rhonchi. Breathing is unlabored. Abdomen: Soft, non-tender, non-distended. Neuro: Alert and oriented X 3. No focal deficit. No significant facial  asymmetry. Moves all extremities spontaneously. Psych:  Responds to questions appropriately with a normal affect.  Procedure:  Flexible Fiberoptic Laryngoscopy Anesthesia: Topical oxymetazoline and lidocaine Indication: Persistent hoarseness Description: Risks, benefits, and alternatives of flexible endoscopy were explained to the patient. Specific mention was made of the risk of throat numbness with difficulty swallowing, possible bleeding from the nose and mouth, and pain from the procedure.  The patient gave oral consent to proceed.  The nasal cavities were decongested and anesthetised with a combination of oxymetazoline and 4% lidocaine solution.  The flexible scope was inserted into the right nasal cavity and advanced towards the nasopharynx.  Visualized mucosa over the turbinates and septum were normal.  The nasopharynx was clear.  Oropharyngeal walls were symmetric and mobile without lesion, mass, or edema.  Hypopharynx was also without  lesion or edema.  Larynx was mobile without lesions.  No lesions or asymmetry in the supraglottic larynx.  Arytenoid mucosa was normal. The right vocal cord is mobile. The left vocal cord is paralyzed at the paramedian position, resulting in a glottic gap.   Assessment/Plan: Left vocal cord paralysis, causing the patient's hoarseness. This is likely secondary to trauma to the left recurrent laryngeal nerve. At this point, it is unclear if this is temporary or permanent. She will likely benefit from undergoing medialization of her left vocal cord with filler  injection. I will contact Dr. Melida Quitter on possibly performing the procedure. If the patient will be discharged home in the near future, the injection can be scheduled as an outpatient procedure.  Jen Benedict,SUI W 02/20/2017, 7:46 PM

## 2017-02-21 ENCOUNTER — Inpatient Hospital Stay (HOSPITAL_COMMUNITY): Payer: Medicare Other

## 2017-02-21 LAB — BASIC METABOLIC PANEL
Anion gap: 12 (ref 5–15)
BUN: 42 mg/dL — AB (ref 6–20)
CALCIUM: 8.9 mg/dL (ref 8.9–10.3)
CO2: 28 mmol/L (ref 22–32)
CREATININE: 2.36 mg/dL — AB (ref 0.44–1.00)
Chloride: 105 mmol/L (ref 101–111)
GFR, EST AFRICAN AMERICAN: 21 mL/min — AB (ref >60)
GFR, EST NON AFRICAN AMERICAN: 18 mL/min — AB (ref >60)
Glucose, Bld: 130 mg/dL — ABNORMAL HIGH (ref 65–99)
Potassium: 2.7 mmol/L — CL (ref 3.5–5.1)
SODIUM: 145 mmol/L (ref 135–145)

## 2017-02-21 LAB — GLUCOSE, CAPILLARY
GLUCOSE-CAPILLARY: 126 mg/dL — AB (ref 65–99)
GLUCOSE-CAPILLARY: 154 mg/dL — AB (ref 65–99)
Glucose-Capillary: 110 mg/dL — ABNORMAL HIGH (ref 65–99)
Glucose-Capillary: 145 mg/dL — ABNORMAL HIGH (ref 65–99)
Glucose-Capillary: 124 mg/dL — ABNORMAL HIGH (ref 65–99)
Glucose-Capillary: 99 mg/dL (ref 65–99)

## 2017-02-21 LAB — HEPATIC FUNCTION PANEL
ALK PHOS: 108 U/L (ref 38–126)
ALT: 33 U/L (ref 14–54)
AST: 45 U/L — ABNORMAL HIGH (ref 15–41)
Albumin: 2.6 g/dL — ABNORMAL LOW (ref 3.5–5.0)
BILIRUBIN DIRECT: 0.9 mg/dL — AB (ref 0.1–0.5)
BILIRUBIN INDIRECT: 1.6 mg/dL — AB (ref 0.3–0.9)
BILIRUBIN TOTAL: 2.5 mg/dL — AB (ref 0.3–1.2)
Total Protein: 5.4 g/dL — ABNORMAL LOW (ref 6.5–8.1)

## 2017-02-21 LAB — COOXEMETRY PANEL
Carboxyhemoglobin: 1.8 % — ABNORMAL HIGH (ref 0.5–1.5)
METHEMOGLOBIN: 0.8 % (ref 0.0–1.5)
O2 SAT: 64.8 %
TOTAL HEMOGLOBIN: 9.6 g/dL — AB (ref 12.0–16.0)

## 2017-02-21 LAB — PROTIME-INR
INR: 5.25
Prothrombin Time: 49.8 seconds — ABNORMAL HIGH (ref 11.4–15.2)

## 2017-02-21 MED ORDER — POTASSIUM CHLORIDE CRYS ER 20 MEQ PO TBCR: 30.0000 meq | EXTENDED_RELEASE_TABLET | ORAL | Status: DC

## 2017-02-21 MED ORDER — POTASSIUM CHLORIDE 2 MEQ/ML IV SOLN
30.0000 meq | Freq: Once | INTRAVENOUS | Status: AC
  Administered 2017-02-21: 30 meq via INTRAVENOUS
  Filled 2017-02-21: qty 15

## 2017-02-21 MED ORDER — SODIUM CHLORIDE 0.9 % IV SOLN
30.0000 meq | Freq: Once | INTRAVENOUS | Status: AC
  Administered 2017-02-21: 30 meq via INTRAVENOUS
  Filled 2017-02-21: qty 15

## 2017-02-21 MED ORDER — POTASSIUM CHLORIDE CRYS ER 20 MEQ PO TBCR
40.0000 meq | EXTENDED_RELEASE_TABLET | Freq: Once | ORAL | Status: AC
  Administered 2017-02-21: 40 meq via ORAL
  Filled 2017-02-21: qty 2

## 2017-02-21 MED ORDER — POTASSIUM CHLORIDE CRYS ER 20 MEQ PO TBCR: 40.0000 meq | EXTENDED_RELEASE_TABLET | Freq: Once | ORAL | Status: DC

## 2017-02-21 MED ORDER — PHYTONADIONE 1 MG/0.5 ML ORAL SOLUTION
1.0000 mg | Freq: Once | ORAL | Status: AC
  Administered 2017-02-21: 1 mg via ORAL
  Filled 2017-02-21: qty 0.5

## 2017-02-21 MED ORDER — POTASSIUM CHLORIDE 20 MEQ/15ML (10%) PO SOLN
30.0000 meq | ORAL | Status: DC
  Administered 2017-02-21: 30 meq via ORAL
  Filled 2017-02-21 (×3): qty 30

## 2017-02-21 NOTE — Progress Notes (Signed)
CSW met patient at bedside to offer support on 02/20/17. Patient is aware that her husband is currently admitted at [facility]. Admission coordinator for [facility] has a bed available for patient and she will place patient in home with spouse. Selinda Eon stated if patient is ready for discharge please call her on cell# [phone] to start process. Patients room number at [facility] will be 208A and report for nurse to call is [phone]. Patient is agreeable to discharge to [facility] once medically ready   Rhea Pink, MSW,  [place]

## 2017-02-21 NOTE — Progress Notes (Signed)
CRITICAL VALUE ALERT  Critical value received:  Potassium 2.7 and INR 5.25  Date of notification:  02/21/2017  Time of notification:  0600  Critical value read back:yes  Nurse who received alert:  Shanterria Franta  MD notified (1st page):  VanTrigt  Time of first page:  0610  MD notified (2nd page): Prescott Gum  Time of second [id number]  Responding MD:  Prescott Gum  Time MD responded:  [phone]

## 2017-02-21 NOTE — Progress Notes (Signed)
CARDIAC REHAB PHASE I   PRE:  Rate/Rhythm: 85   BP:  Sitting: 140/86       SaO2: 93 Ra  MODE:  Ambulation: Stand only   POST:  Rate/Rhythm: 99  BP:  Sitting:      SaO2: 95 ra 1330-1400 Patient sitting up in chair when cardiac RN arrived. Patient with eyes closed. Patient stating she feels extremely SOB and dizzy. Encouraged patient to attempt to stand with vitals being stable. Patient unable to stand with one assist. Worked with patient on not using elbow. Primary RN notified. Requested primary RN assist with ambulation. 2 nurses attempted to stand patient. Patient stated legs too weak. Required a 3rd RN to assist with standing. Patient moved back to bed with call light in place. Will follow up with patient on Monday. Did encourage her to ambulate later today if she begins to feel better with rest.  Victorino Dike, BSN 02/21/2017 2:00 PM

## 2017-02-21 NOTE — Progress Notes (Signed)
Patient ID: [name], female   DOB: 1935/12/22, 82 y.o.   MRN: [id number]     Advanced Heart Failure Rounding Note  PCP: Marden Noble, MD Primary Cardiologist: Dr. Yvone Neu [facility]) HF: Dr. Haroldine Laws   Subjective:    S/p emergent CABG x 4 and bioprosthetic MVR for LCX NSTEMI with flail MV on 2/15. Echo with EF 40% and stable MVR  Remains in NSR on po amio, had short run of AF  Coox 65%-> 50% ->64.8%  Out 2.3L since admission  Creatinine stable at 2.3-2.4.   Remains on meropenem. Cx remain negative. Afebrile. WBC falling slowly. INR 5.25    Objective:   Weight Range: 178 lb 1.6 oz (80.8 kg) Body mass index is 28.75 kg/m.   Vital Signs:   Temp:  [97.4 F (36.3 C)-98.2 F (36.8 C)] 97.8 F (36.6 C) (03/03 0349) Pulse Rate:  [91-96] 95 (03/03 0349) Resp:  [18-22] 20 (03/03 0349) BP: (134-152)/(83-92) 152/92 (03/03 0349) SpO2:  [98 %] 98 % (03/03 0349) Weight:  [178 lb 1.6 oz (80.8 kg)] 178 lb 1.6 oz (80.8 kg) (03/03 0024) Last BM Date: 02/19/17  Weight change: Filed Weights   02/19/17 0500 02/20/17 0506 02/21/17 0024  Weight: 180 lb 5.4 oz (81.8 kg) 180 lb 12.8 oz (82 kg) 178 lb 1.6 oz (80.8 kg)    Intake/Output:   Intake/Output Summary (Last 24 hours) at 02/21/17 0920 Last data filed at 02/21/17 0424  Gross per 24 hour  Intake              250 ml  Output                0 ml  Net              250 ml     Physical Exam: General:  Seated in chair. NAD. Remains hoarse.    HEENT: Normal. ? Mild left facial droop and flattening of left nasolabial folds Neck: Supple JVP 9-10, 2+ bilat; no bruits. No lymphadenopathy or thyromegaly appreciated. Cor: PMI nondisplaced. regular. No M/G/R noted.   Lungs: Diminished basilar sounds.   Abdomen: soft, NT, ND, no HSM. No bruits or masses. +BS  Extremities: no cyanosis, clubbing, rash. RUE PICC. Trace-1+ edema Neuro: A&Ox3 follows commands and is oriented. Moves all 4  Telemetry: Personally reviewed, NSR in 90s.   Short run of AF overnight  Labs: CBC  Recent Labs  02/19/17 0521 02/20/17 0402  WBC 16.5* 16.2*  HGB 9.3* 9.4*  HCT 28.9* 29.6*  MCV 76.5* 77.1*  PLT 376 409   Basic Metabolic Panel  Recent Labs  02/20/17 0402 02/21/17 0435  NA 148* 145  K 3.0* 2.7*  CL 108 105  CO2 27 28  GLUCOSE 126* 130*  BUN 39* 42*  CREATININE 2.37* 2.36*  CALCIUM 8.9 8.9   Liver Function Tests No results for input(s): AST, ALT, ALKPHOS, BILITOT, PROT, ALBUMIN in the last 72 hours. No results for input(s): LIPASE, AMYLASE in the last 72 hours. Cardiac Enzymes No results for input(s): CKTOTAL, CKMB, CKMBINDEX, TROPONINI in the last 72 hours.  BNP: BNP (last 3 results) No results for input(s): BNP in the last 8760 hours.  ProBNP (last 3 results) No results for input(s): PROBNP in the last 8760 hours.   D-Dimer No results for input(s): DDIMER in the last 72 hours. Hemoglobin A1C No results for input(s): HGBA1C in the last 72 hours. Fasting Lipid Panel No results for input(s): CHOL, HDL, LDLCALC, TRIG, CHOLHDL,  LDLDIRECT in the last 72 hours. Thyroid Function Tests No results for input(s): TSH, T4TOTAL, T3FREE, THYROIDAB in the last 72 hours.  Invalid input(s): FREET3  Other results:     Imaging/Studies:  No results found.    Medications:     Scheduled Medications: . amiodarone  400 mg Oral BID  . atorvastatin  80 mg Oral q1800  . insulin aspart  0-15 Units Subcutaneous Q4H  . mouth rinse  15 mL Mouth Rinse q12n4p  . meropenem (MERREM) IV  500 mg Intravenous Q12H  . moving right along book   Does not apply Once  . nystatin  5 mL Mouth/Throat QID  . pantoprazole (PROTONIX) IV  40 mg Intravenous Q24H  . potassium chloride  40 mEq Oral Daily  . potassium chloride  40 mEq Oral Once  . torsemide  40 mg Oral Daily  . Warfarin - Physician Dosing Inpatient   Does not apply q1800    Infusions:   PRN Medications: hydrALAZINE, ondansetron (ZOFRAN) IV, RESOURCE THICKENUP  CLEAR, sodium chloride flush, traMADol   Assessment/Plan   1. CAD [name] on 2/15 2. Severe MR with flail segment s/p MVR c/ bioprosthetic valve. 3. Cardiogenic shock: Echo pre-op with EF 40-45%, lateral akinesis, severe MR.     --Echo 2/22 EF 40% MVR ok 4. AKI 5. Shock Liver 6. Acute respiratory failure with hypoxemia requiring intubation 7. Thrombocytopenia: Post-op. 8. Tracheal aspirate with Strep pneumoniae 9. PAF 10. Leukocytosis 11. Hypokalemia  Co-ox back up this am off dopamine. Yesterday may not have been accurate. Overall clinically stable and progressing slowly.  Remains volume overloaded about 22 pounds up from baseline but mostly 3rd spacing and has been somewhat refractory to diuretcs and we have been proceeding cautiously so as not to worsen her renal function. If creatinine stable can try one dose on metolazone over the weekend. Would not restart milrinone at this point unless co-ox continues to drop.   Maintaining NSR on po amio. Arvil Utz continue. Drop to 200 bid on Monday. Holding warfarin for supratherapeutic INR  Off ASA with coumadin. Continue statin. Possibly start BB tomorrow pending CoOx.   K low - Kristyl Athens supp.  WBC continues to come down. Continue meropenem. Cx data unremarkable.   Continue to mobilize.  Durga Saldarriaga ask ENT to evaluate vocal cords due to hoarseness.  Mild left facial droop with flattening of left naso-labial folds. Susane Bey follow. Can CT head as needed.    Rabab Currington Meredith Leeds, MD  9:20 AM

## 2017-02-21 NOTE — Progress Notes (Addendum)
[address]       [place] [address]             [phone]      16 Days Post-Op Procedure(s) (LRB): CORONARY ARTERY BYPASS GRAFTING (CABG) x 4                                                                               LIMA-LAD SEQ SVG-DIAG-OM SVG-PD (N/A) TRANSESOPHAGEAL ECHOCARDIOGRAM (TEE) (N/A) MITRAL VALVE REPLACEMENT (MVR) WITH MAGNA MITRAL EASE PERICARDIAL BIOPROSTHESIS 25 MM (N/A) Subjective: Feels more SOB this am  Objective: Vital signs in last 24 hours: Temp:  [97.4 F (36.3 C)-98.2 F (36.8 C)] 97.8 F (36.6 C) (03/03 0349) Pulse Rate:  [91-96] 95 (03/03 0349) Cardiac Rhythm: Other (Comment) (03/03 0703) Resp:  [18-22] 20 (03/03 0349) BP: (134-152)/(83-92) 152/92 (03/03 0349) SpO2:  [98 %] 98 % (03/03 0349) Weight:  [178 lb 1.6 oz (80.8 kg)] 178 lb 1.6 oz (80.8 kg) (03/03 0024)  Hemodynamic parameters for last 24 hours:    Intake/Output from previous day: 03/02 0701 - 03/03 0700 In: 490 [P.O.:480; I.V.:10] Out: -  Intake/Output this shift: No intake/output data recorded.  General appearance: alert, cooperative and no distress Heart: regular rate and rhythm and tachy Lungs: dim in lower fields Abdomen: benign Extremities: + BLE edema Wound: incis healing well  Lab Results:  Recent Labs  02/19/17 0521 02/20/17 0402  WBC 16.5* 16.2*  HGB 9.3* 9.4*  HCT 28.9* 29.6*  PLT 376 380   BMET:  Recent Labs  02/20/17 0402 02/21/17 0435  NA 148* 145  K 3.0* 2.7*  CL 108 105  CO2 27 28  GLUCOSE 126* 130*  BUN 39* 42*  CREATININE 2.37* 2.36*  CALCIUM 8.9 8.9    PT/INR:  Recent Labs  02/21/17 0435  LABPROT 49.8*  INR 5.25*   ABG    Component Value Date/Time   PHART 7.377 02/09/2017 1603   HCO3 23.0 02/09/2017 1603   TCO2 26 02/11/2017 1618   ACIDBASEDEF 2.0 02/09/2017 1603   O2SAT 64.8 02/21/2017 0450   CBG (last 3)   Recent Labs  02/20/17 2119 02/21/17 0423 02/21/17 0757  GLUCAP 94 126* 110*     Meds Scheduled Meds: . amiodarone  400 mg Oral BID  . atorvastatin  80 mg Oral q1800  . insulin aspart  0-15 Units Subcutaneous Q4H  . mouth rinse  15 mL Mouth Rinse q12n4p  . meropenem (MERREM) IV  500 mg Intravenous Q12H  . moving right along book   Does not apply Once  . nystatin  5 mL Mouth/Throat QID  . pantoprazole (PROTONIX) IV  40 mg Intravenous Q24H  . potassium chloride  30 mEq Oral Q2H  . potassium chloride  40 mEq Oral Daily  . torsemide  40 mg Oral Daily  . Warfarin - Physician Dosing Inpatient   Does not apply q1800   Continuous Infusions: PRN Meds:.hydrALAZINE, ondansetron (ZOFRAN) IV, RESOURCE THICKENUP CLEAR, sodium chloride flush, traMADol  Xrays No results found.  Assessment/Plan: S/P Procedure(s) (LRB): CORONARY ARTERY BYPASS GRAFTING (CABG) x 4  LIMA-LAD SEQ SVG-DIAG-OM SVG-PD (N/A) TRANSESOPHAGEAL ECHOCARDIOGRAM (TEE) (N/A) MITRAL VALVE REPLACEMENT (MVR) WITH MAGNA MITRAL EASE PERICARDIAL BIOPROSTHESIS 25 MM (N/A)  1CHF-  AHF assisting with medication management/diuretics. Creat is stable, K+ replacement ordered  2Will check hepatic panel and hold amio for now with rising INR. - cont to hold coumadin 3 check CXR as effusions may be increased or may be exhibiting pulm toxicity 4 vocal cord paralysis, may benefit from medialization- ENT is consulted 5 push rehab/pulm toilet as able 6 conts meropenem- ? duration  LOS: 18 days    GOLD,WAYNE E 02/21/2017 Patient feels poorly and may be related to her anemia Will and type and screen to AM labs to prepare for possible transfusion LFTs appear satisfactory Vital signs, blood glucose levels satisfactory 10 days meropenem with recent implantation of prosthetic valve patient examined and medical record reviewed,agree with above note. Tharon Aquas Trigt III 02/21/2017

## 2017-02-22 ENCOUNTER — Inpatient Hospital Stay (HOSPITAL_COMMUNITY): Payer: Medicare Other

## 2017-02-22 DIAGNOSIS — I509 Heart failure, unspecified: Secondary | ICD-10-CM

## 2017-02-22 LAB — BASIC METABOLIC PANEL
ANION GAP: 10 (ref 5–15)
BUN: 42 mg/dL — AB (ref 6–20)
CHLORIDE: 109 mmol/L (ref 101–111)
CO2: 27 mmol/L (ref 22–32)
Calcium: 8.8 mg/dL — ABNORMAL LOW (ref 8.9–10.3)
Creatinine, Ser: 2.35 mg/dL — ABNORMAL HIGH (ref 0.44–1.00)
GFR calc Af Amer: 22 mL/min — ABNORMAL LOW (ref >60)
GFR calc non Af Amer: 19 mL/min — ABNORMAL LOW (ref >60)
GLUCOSE: 141 mg/dL — AB (ref 65–99)
POTASSIUM: 3.9 mmol/L (ref 3.5–5.1)
SODIUM: 146 mmol/L — AB (ref 135–145)

## 2017-02-22 LAB — GLUCOSE, CAPILLARY
GLUCOSE-CAPILLARY: 122 mg/dL — AB (ref 65–99)
GLUCOSE-CAPILLARY: 151 mg/dL — AB (ref 65–99)
GLUCOSE-CAPILLARY: 141 mg/dL — AB (ref 65–99)
Glucose-Capillary: 129 mg/dL — ABNORMAL HIGH (ref 65–99)
Glucose-Capillary: 131 mg/dL — ABNORMAL HIGH (ref 65–99)

## 2017-02-22 LAB — ECHOCARDIOGRAM LIMITED
Area-P 1/2: 4 cm2
E decel time: 158 msec
FS: 17 % — AB (ref 28–44)
Height: 66 in
IVS/LV PW RATIO, ED: 0.98
LA ID, A-P, ES: 48 mm
LA diam end sys: 48 mm
LA diam index: 2.51 cm/m2
LV PW d: 10.6 mm — AB (ref 0.6–1.1)
LV dias vol index: 45 mL/m2
LV dias vol: 85 mL (ref 46–106)
LV sys vol index: 38 mL/m2
LV sys vol: 72 mL — AB (ref 14–42)
LVOT area: 2.84 cm2
LVOT diameter: 19 mm
MV Dec: 158
MV Peak grad: 12 mmHg
MV pk A vel: 131 m/s
MV pk E vel: 176 m/s
P 1/2 time: 55 ms
RV sys press: 51 mmHg
Reg peak vel: 347 cm/s
Simpson's disk: 16
Stroke v: 13 ml
TR max vel: 347 cm/s
Weight: 2886.4 oz

## 2017-02-22 LAB — COOXEMETRY PANEL
Carboxyhemoglobin: 1.4 % (ref 0.5–1.5)
Methemoglobin: 1.1 % (ref 0.0–1.5)
O2 Saturation: 51.9 %
TOTAL HEMOGLOBIN: 10 g/dL — AB (ref 12.0–16.0)

## 2017-02-22 LAB — PROTIME-INR
INR: 2.76
Prothrombin Time: 29.7 seconds — ABNORMAL HIGH (ref 11.4–15.2)

## 2017-02-22 MED ORDER — MILRINONE LACTATE IN DEXTROSE 20-5 MG/100ML-% IV SOLN: 0.2500 ug/kg/min | INTRAVENOUS | Status: DC

## 2017-02-22 MED ORDER — DIPHENHYDRAMINE HCL 25 MG PO CAPS
25.0000 mg | ORAL_CAPSULE | Freq: Every evening | ORAL | Status: DC | PRN
  Administered 2017-02-22 – 2017-02-26 (×4): 25 mg via ORAL
  Filled 2017-02-22 (×4): qty 1

## 2017-02-22 MED ORDER — MILRINONE LACTATE IN DEXTROSE 20-5 MG/100ML-% IV SOLN
0.1250 ug/kg/min | INTRAVENOUS | Status: DC
  Administered 2017-02-22 – 2017-03-02 (×13): 0.25 ug/kg/min via INTRAVENOUS
  Administered 2017-03-03: 0.125 ug/kg/min via INTRAVENOUS
  Filled 2017-02-22 (×14): qty 100

## 2017-02-22 NOTE — Progress Notes (Signed)
Patient ID: [name] [name], female   DOB: [birth year], 82 y.o.   MRN: [id number]     Advanced Heart Failure Rounding Note  PCP: Marden Noble, MD Primary Cardiologist: Dr. Yvone Neu [facility]) HF: Dr. Haroldine Laws   Subjective:    S/p emergent CABG x 4 and bioprosthetic MVR for LCX NSTEMI with flail MV on 2/15. Echo with EF 40% and stable MVR  Remains in NSR on po amio, had short run of AF  Coox 65%-> 50% ->64.8% -> 50%  Out 2.8L since admission, no collecting urine over the last day.  Creatinine stable at 2.3-2.4.   Remains on meropenem. Cx remain negative. Afebrile. WBC falling slowly. INR 5.25  Continued DOE.  Objective:   Weight Range: 180 lb 6.4 oz (81.8 kg) Body mass index is 29.12 kg/m.   Vital Signs:   Temp:  [97.5 F (36.4 C)-98.2 F (36.8 C)] 98.2 F (36.8 C) (03/04 0452) Pulse Rate:  [89-95] 94 (03/04 0452) Resp:  [18-20] 20 (03/04 0452) BP: (140-153)/(86-90) 153/90 (03/04 0452) SpO2:  [97 %-100 %] 100 % (03/04 0452) Weight:  [180 lb 6.4 oz (81.8 kg)] 180 lb 6.4 oz (81.8 kg) (03/04 0452) Last BM Date: 02/21/17  Weight change: Filed Weights   02/20/17 0506 02/21/17 0024 02/22/17 0452  Weight: 180 lb 12.8 oz (82 kg) 178 lb 1.6 oz (80.8 kg) 180 lb 6.4 oz (81.8 kg)    Intake/Output:   Intake/Output Summary (Last 24 hours) at 02/22/17 1006 Last data filed at 02/21/17 1252  Gross per 24 hour  Intake              120 ml  Output                0 ml  Net              120 ml     Physical Exam: General:  Seated in chair. NAD. Remains hoarse.    HEENT: Normal. ? Mild left facial droop and flattening of left nasolabial folds Neck: Supple JVP 9, 2+ bilat; no bruits. No lymphadenopathy or thyromegaly appreciated. Cor: PMI nondisplaced. regular. No M/G/R noted.   Lungs: Diminished basilar sounds.   Abdomen: soft, NT, ND, no HSM. No bruits or masses. +BS  Extremities: no cyanosis, clubbing, rash. RUE PICC. Trace-1+ edema Neuro: A&Ox3 follows commands and is  oriented. Moves all 4  Telemetry: Personally reviewed, NSR in 90s.    Labs: CBC  Recent Labs  02/20/17 0402  WBC 16.2*  HGB 9.4*  HCT 29.6*  MCV 77.1*  PLT 235   Basic Metabolic Panel  Recent Labs  02/21/17 0435 02/22/17 0412  NA 145 146*  K 2.7* 3.9  CL 105 109  CO2 28 27  GLUCOSE 130* 141*  BUN 42* 42*  CREATININE 2.36* 2.35*  CALCIUM 8.9 8.8*   Liver Function Tests  Recent Labs  02/21/17 1213  AST 45*  ALT 33  ALKPHOS 108  BILITOT 2.5*  PROT 5.4*  ALBUMIN 2.6*   No results for input(s): LIPASE, AMYLASE in the last 72 hours. Cardiac Enzymes No results for input(s): CKTOTAL, CKMB, CKMBINDEX, TROPONINI in the last 72 hours.  BNP: BNP (last 3 results) No results for input(s): BNP in the last 8760 hours.  ProBNP (last 3 results) No results for input(s): PROBNP in the last 8760 hours.   D-Dimer No results for input(s): DDIMER in the last 72 hours. Hemoglobin A1C No results for input(s): HGBA1C in the last 72 hours. Fasting  Lipid Panel No results for input(s): CHOL, HDL, LDLCALC, TRIG, CHOLHDL, LDLDIRECT in the last 72 hours. Thyroid Function Tests No results for input(s): TSH, T4TOTAL, T3FREE, THYROIDAB in the last 72 hours.  Invalid input(s): FREET3  Other results:     Imaging/Studies:  Dg Chest 2 View  Result Date: 02/21/2017 CLINICAL DATA:  Shortness of breath. EXAM: CHEST  2 VIEW COMPARISON:  February 19, 2017 FINDINGS: Bilateral pleural effusions, left greater than right, with associated atelectasis. Cardiomegaly. No overt edema. Stable right PICC line. No other interval changes. IMPRESSION: Small bilateral pleural effusions, left greater than right, with underlying atelectasis. No other interval changes. Electronically Signed   By: Dorise Bullion III M.D   On: 02/21/2017 18:19      Medications:     Scheduled Medications: . insulin aspart  0-15 Units Subcutaneous Q4H  . mouth rinse  15 mL Mouth Rinse q12n4p  . meropenem (MERREM) IV   500 mg Intravenous Q12H  . moving right along book   Does not apply Once  . nystatin  5 mL Mouth/Throat QID  . pantoprazole (PROTONIX) IV  40 mg Intravenous Q24H  . potassium chloride  40 mEq Oral Daily  . torsemide  40 mg Oral Daily  . Warfarin - Physician Dosing Inpatient   Does not apply q1800    Infusions:   PRN Medications: diphenhydrAMINE, hydrALAZINE, ondansetron (ZOFRAN) IV, RESOURCE THICKENUP CLEAR, sodium chloride flush, traMADol   Assessment/Plan   1. CAD [name] on 2/15 2. Severe MR with flail segment s/p MVR c/ bioprosthetic valve. 3. Cardiogenic shock: Echo pre-op with EF 40-45%, lateral akinesis, severe MR.     --Echo 2/22 EF 40% MVR ok 4. AKI 5. Shock Liver 6. Acute respiratory failure with hypoxemia requiring intubation 7. Thrombocytopenia: Post-op. 8. Tracheal aspirate with Strep pneumoniae 9. PAF 10. Leukocytosis 11. Hypokalemia  Co-ox back up this am off dopamine. Yesterday may not have been accurate. Overall clinically stable and progressing slowly.  Remains volume overloaded about 22 pounds up from baseline but mostly 3rd spacing and has been somewhat refractory to diuretcs and we have been proceeding cautiously so as not to worsen her renal function. Has been urinating and not collecting her urine. Scherrie Seneca start to collect today and get a more accurate idea of I and O. CoOx down again today but has not been consistent since moving to the floor. If remains low tomorrow, may benefit from milrinone.  Maintaining NSR. Jazman Reuter continue.  LFTs elevated today with elevated bilirubin. Have held amiodarone currently to see if they improve. Could be due to gall sludge from prolonged ICU stay.  Off ASA with coumadin. Statin held due to increased LFTs.   K low - Sanjuanita Condrey supp.  WBC continues to come down. Continue meropenem. Cx data unremarkable.   Continue to mobilize.  Mild left facial droop with flattening of left naso-labial folds. Mitcheal Sweetin follow. Can CT head as  needed.    Tennyson Kallen Meredith Leeds, MD  10:06 AM

## 2017-02-22 NOTE — Clinical Social Work Note (Signed)
Bed offers given to pt's sister and dtr Nakira [phone] via telephone, [employer] selected. Pt's spouse is at [facility] and pt will go there with spouse and share a room. CSW will await pt's medical clearance and coordinate safe DC to SNF.  Pt has no other needs at this time.  CSW will continue to follow.  Sanela Evola B. Joline Maxcy Clinical Social Work Dept Weekend Social Worker [phone] 2:14 PM

## 2017-02-22 NOTE — Progress Notes (Signed)
  Echocardiogram 2D Echocardiogram has been performed.  [name] [name] 02/22/2017, 2:11 PM

## 2017-02-22 NOTE — Progress Notes (Addendum)
[address]       [place] [address]             [phone]      17 Days Post-Op Procedure(s) (LRB): CORONARY ARTERY BYPASS GRAFTING (CABG) x 4                                                                               LIMA-LAD SEQ SVG-DIAG-OM SVG-PD (N/A) TRANSESOPHAGEAL ECHOCARDIOGRAM (TEE) (N/A) MITRAL VALVE REPLACEMENT (MVR) WITH MAGNA MITRAL EASE PERICARDIAL BIOPROSTHESIS 25 MM (N/A) Subjective: Remains pretty SOB with activity  Objective: Vital signs in last 24 hours: Temp:  [97.5 F (36.4 C)-98.2 F (36.8 C)] 98.2 F (36.8 C) (03/04 0452) Pulse Rate:  [89-95] 94 (03/04 0452) Cardiac Rhythm: Normal sinus rhythm (03/04 0727) Resp:  [18-20] 20 (03/04 0452) BP: (140-153)/(86-90) 153/90 (03/04 0452) SpO2:  [97 %-100 %] 100 % (03/04 0452) Weight:  [180 lb 6.4 oz (81.8 kg)] 180 lb 6.4 oz (81.8 kg) (03/04 0452)  Hemodynamic parameters for last 24 hours:    Intake/Output from previous day: 03/03 0701 - 03/04 0700 In: 120 [P.O.:120] Out: -  Intake/Output this shift: No intake/output data recorded.  General appearance: alert, cooperative, fatigued and no distress Heart: regular rate and rhythm Lungs: dim in bases Abdomen: benign Extremities: + BLE pitting edema Wound: incis healing well  Lab Results:  Recent Labs  02/20/17 0402  WBC 16.2*  HGB 9.4*  HCT 29.6*  PLT 380   BMET:  Recent Labs  02/21/17 0435 02/22/17 0412  NA 145 146*  K 2.7* 3.9  CL 105 109  CO2 28 27  GLUCOSE 130* 141*  BUN 42* 42*  CREATININE 2.36* 2.35*  CALCIUM 8.9 8.8*    PT/INR:  Recent Labs  02/22/17 0412  LABPROT 29.7*  INR 2.76   ABG    Component Value Date/Time   PHART 7.377 02/09/2017 1603   HCO3 23.0 02/09/2017 1603   TCO2 26 02/11/2017 1618   ACIDBASEDEF 2.0 02/09/2017 1603   O2SAT 51.9 02/22/2017 0420   CBG (last 3)   Recent Labs  02/21/17 2327 02/22/17 0454 02/22/17 0729  GLUCAP 99 129* 122*    Meds Scheduled Meds: .  atorvastatin  80 mg Oral q1800  . insulin aspart  0-15 Units Subcutaneous Q4H  . mouth rinse  15 mL Mouth Rinse q12n4p  . meropenem (MERREM) IV  500 mg Intravenous Q12H  . moving right along book   Does not apply Once  . nystatin  5 mL Mouth/Throat QID  . pantoprazole (PROTONIX) IV  40 mg Intravenous Q24H  . potassium chloride  40 mEq Oral Daily  . torsemide  40 mg Oral Daily  . Warfarin - Physician Dosing Inpatient   Does not apply q1800   Continuous Infusions: PRN Meds:.hydrALAZINE, ondansetron (ZOFRAN) IV, RESOURCE THICKENUP CLEAR, sodium chloride flush, traMADol  Xrays Dg Chest 2 View  Result Date: 02/21/2017 CLINICAL DATA:  Shortness of breath. EXAM: CHEST  2 VIEW COMPARISON:  February 19, 2017 FINDINGS: Bilateral pleural effusions, left greater than right, with associated atelectasis. Cardiomegaly. No overt edema. Stable right PICC line. No other interval changes. IMPRESSION: Small bilateral pleural effusions,  left greater than right, with underlying atelectasis. No other interval changes. Electronically Signed   By: Dorise Bullion III M.D   On: 02/21/2017 18:19    Assessment/Plan: S/P Procedure(s) (LRB): CORONARY ARTERY BYPASS GRAFTING (CABG) x 4                                                                               LIMA-LAD SEQ SVG-DIAG-OM SVG-PD (N/A) TRANSESOPHAGEAL ECHOCARDIOGRAM (TEE) (N/A) MITRAL VALVE REPLACEMENT (MVR) WITH MAGNA MITRAL EASE PERICARDIAL BIOPROSTHESIS 25 MM (N/A)  1 conts with slow but steady overall progress 2 AST is only slightly elevated but does have hyperbilirubinemia. Not abviously jaundiced but I think we should continue to hold amio and also d/c statin for now to avoid further hepatotoxicity. Alk phos is normal but could consider [place] of [place] as she may have sludgeplaying a role. Doubt this is hemolysis but is possible. Renal fxn is stable at creat 2.35. Recheck CBC today 3 K+ is improved 4 uncertain of accuracy of C0-Ox at 51 today- SBP in 140-150  range and she does better with higher perfusion pressures. AHF team assisting with management. 5 cont to hold coumadin 1 more day. . INR decreased to 2.7  6 d7/10 meripenum  LOS: 19 days    GOLD,WAYNE E 02/22/2017  Neck veins very distended, INR has been supra therapeutic Check 2D echo to see if she has developed sig pericardial effusion nsr this am patient examined and medical record reviewed,agree with above note. Tharon Aquas Trigt III 02/22/2017  Transthoracic echocardiogram performed LVEF 25% Mitral valve functioning well There is moderate tricuspid regurgitation A left pleural effusion is reported but no significant pericardial effusion Will start patient back on milrinone because of symptoms of fatigue and elevated neck veins and low co- ox

## 2017-02-23 LAB — GLUCOSE, CAPILLARY
GLUCOSE-CAPILLARY: 134 mg/dL — AB (ref 65–99)
GLUCOSE-CAPILLARY: 132 mg/dL — AB (ref 65–99)
GLUCOSE-CAPILLARY: 171 mg/dL — AB (ref 65–99)
Glucose-Capillary: 132 mg/dL — ABNORMAL HIGH (ref 65–99)
Glucose-Capillary: 178 mg/dL — ABNORMAL HIGH (ref 65–99)
Glucose-Capillary: 190 mg/dL — ABNORMAL HIGH (ref 65–99)

## 2017-02-23 LAB — CBC
HCT: 30.5 % — ABNORMAL LOW (ref 36.0–46.0)
Hemoglobin: 9.4 g/dL — ABNORMAL LOW (ref 12.0–15.0)
MCH: 24.5 pg — ABNORMAL LOW (ref 26.0–34.0)
MCHC: 30.8 g/dL (ref 30.0–36.0)
MCV: 79.4 fL (ref 78.0–100.0)
Platelets: 371 10*3/uL (ref 150–400)
RBC: 3.84 MIL/uL — ABNORMAL LOW (ref 3.87–5.11)
RDW: 24.6 % — ABNORMAL HIGH (ref 11.5–15.5)
WBC: 16.4 10*3/uL — ABNORMAL HIGH (ref 4.0–10.5)

## 2017-02-23 LAB — BASIC METABOLIC PANEL
ANION GAP: 8 (ref 5–15)
BUN: 43 mg/dL — ABNORMAL HIGH (ref 6–20)
CHLORIDE: 111 mmol/L (ref 101–111)
CO2: 29 mmol/L (ref 22–32)
Calcium: 8.7 mg/dL — ABNORMAL LOW (ref 8.9–10.3)
Creatinine, Ser: 2.44 mg/dL — ABNORMAL HIGH (ref 0.44–1.00)
GFR calc Af Amer: 21 mL/min — ABNORMAL LOW (ref >60)
GFR, EST NON AFRICAN AMERICAN: 18 mL/min — AB (ref >60)
Glucose, Bld: 137 mg/dL — ABNORMAL HIGH (ref 65–99)
POTASSIUM: 3.4 mmol/L — AB (ref 3.5–5.1)
SODIUM: 148 mmol/L — AB (ref 135–145)

## 2017-02-23 LAB — COOXEMETRY PANEL
Carboxyhemoglobin: 1.6 % — ABNORMAL HIGH (ref 0.5–1.5)
METHEMOGLOBIN: 1.1 % (ref 0.0–1.5)
O2 Saturation: 72.1 %
TOTAL HEMOGLOBIN: 9.7 g/dL — AB (ref 12.0–16.0)

## 2017-02-23 LAB — PROTIME-INR
INR: 2.29
Prothrombin Time: 25.6 seconds — ABNORMAL HIGH (ref 11.4–15.2)

## 2017-02-23 MED ORDER — AMIODARONE HCL 200 MG PO TABS
200.0000 mg | ORAL_TABLET | Freq: Two times a day (BID) | ORAL | Status: DC
  Administered 2017-02-23 – 2017-02-24 (×4): 200 mg via ORAL
  Filled 2017-02-23 (×4): qty 1

## 2017-02-23 MED ORDER — WARFARIN SODIUM 1 MG PO TABS: 1.0000 mg | ORAL_TABLET | Freq: Once | ORAL | Status: DC

## 2017-02-23 MED ORDER — INSULIN ASPART 100 UNIT/ML ~~LOC~~ SOLN
0.0000 [IU] | Freq: Three times a day (TID) | SUBCUTANEOUS | Status: DC
  Administered 2017-02-24: 3 [IU] via SUBCUTANEOUS
  Administered 2017-02-24: 2 [IU] via SUBCUTANEOUS
  Administered 2017-02-24: 3 [IU] via SUBCUTANEOUS
  Administered 2017-02-25: 2 [IU] via SUBCUTANEOUS
  Administered 2017-02-25: 3 [IU] via SUBCUTANEOUS
  Administered 2017-02-25: 2 [IU] via SUBCUTANEOUS
  Administered 2017-02-26: 3 [IU] via SUBCUTANEOUS
  Administered 2017-02-26 – 2017-02-27 (×3): 2 [IU] via SUBCUTANEOUS
  Administered 2017-02-27 – 2017-02-28 (×3): 3 [IU] via SUBCUTANEOUS
  Administered 2017-02-28 (×2): 2 [IU] via SUBCUTANEOUS
  Administered 2017-03-01 (×3): 3 [IU] via SUBCUTANEOUS
  Administered 2017-03-02: 5 [IU] via SUBCUTANEOUS
  Administered 2017-03-02 – 2017-03-03 (×3): 2 [IU] via SUBCUTANEOUS
  Administered 2017-03-03: 3 [IU] via SUBCUTANEOUS
  Administered 2017-03-03: 5 [IU] via SUBCUTANEOUS
  Administered 2017-03-04: 2 [IU] via SUBCUTANEOUS
  Administered 2017-03-04 (×2): 3 [IU] via SUBCUTANEOUS
  Administered 2017-03-05 (×2): 2 [IU] via SUBCUTANEOUS

## 2017-02-23 MED ORDER — AMIODARONE IV BOLUS ONLY 150 MG/100ML
150.0000 mg | Freq: Once | INTRAVENOUS | Status: AC
  Administered 2017-02-23: 150 mg via INTRAVENOUS
  Filled 2017-02-23: qty 100

## 2017-02-23 MED ORDER — FLUCONAZOLE 100 MG PO TABS
100.0000 mg | ORAL_TABLET | Freq: Once | ORAL | Status: AC
  Administered 2017-02-23: 100 mg via ORAL
  Filled 2017-02-23: qty 1

## 2017-02-23 MED ORDER — FLUCONAZOLE 100 MG PO TABS
50.0000 mg | ORAL_TABLET | Freq: Every day | ORAL | Status: DC
  Administered 2017-02-24 – 2017-02-27 (×4): 50 mg via ORAL
  Filled 2017-02-23 (×4): qty 1

## 2017-02-23 MED ORDER — WARFARIN SODIUM 1 MG PO TABS
1.0000 mg | ORAL_TABLET | Freq: Once | ORAL | Status: AC
  Administered 2017-02-23: 1 mg via ORAL
  Filled 2017-02-23: qty 1

## 2017-02-23 NOTE — Progress Notes (Signed)
EPW d/c'd per order and per protocol. Tips intact. VSS. Pt tolerated well. Call bell and phone within reach. Will continue to monitor.

## 2017-02-23 NOTE — Consult Note (Signed)
Reason for Consult: Hoarseness Referring Physician: CVTS  [name] [name] is an 82 y.o. female.  HPI: 82 year old female underwent emergency 4-vessel CABG with mitral valve repair on 02/05/17 and has had a hoarse voice since surgery that has not improved.  She was evaluated last week by Dr. Benjamine Mola who diagnosed left vocal fold paralysis.  He has asked for me to consult about surgical management.  The patient has had difficulty swallowing as well and a modified barium swallow demonstrated moderate pharyngeal dysphagia.  She is taking a mechanical soft diet with honey thick liquids.  Discharge to rehab is being contemplated.  Past Medical History:  Diagnosis Date  . [name]    a. LHC 02/03/17 for unstable angina showed severe 3v CAD w/ sequential 70% & 80% p-mLAD, 99% mLCx, CTO mRCA, elevated LV filling pressure  . Colon polyp 2010  . Diabetes mellitus with complication ([place])   . Glaucoma   . Hypertension     Past Surgical History:  Procedure Laterality Date  . APPENDECTOMY    . BREAST BIOPSY Right 1999  . COLONOSCOPY  2010  . CORONARY ARTERY BYPASS GRAFT N/A 02/05/2017   Procedure: CORONARY ARTERY BYPASS GRAFTING (CABG) x 4                                                                               LIMA-LAD SEQ SVG-DIAG-OM SVG-PD;  Surgeon: Gaye Pollack, MD;  Location: [facility];  Service: Open Heart Surgery;  Laterality: N/A;  . IABP INSERTION N/A 02/04/2017   Procedure: IABP Insertion;  Surgeon: Leonie Man, MD;  Location: [facility];  Service: Cardiovascular;  Laterality: N/A;  . LEFT HEART CATH AND CORONARY ANGIOGRAPHY Left 02/03/2017   Procedure: Left Heart Cath and Coronary Angiography;  Surgeon: Nelva Bush, MD;  Location: [facility];  Service: Cardiovascular;  Laterality: Left;  . MITRAL VALVE REPAIR N/A 02/05/2017   Procedure: MITRAL VALVE REPLACEMENT (MVR) WITH MAGNA MITRAL EASE PERICARDIAL BIOPROSTHESIS 25 MM;  Surgeon: Gaye Pollack, MD;   Location: [facility];  Service: Open Heart Surgery;  Laterality: N/A;  . PARATHYROIDECTOMY     1986  . RIGHT HEART CATH N/A 02/04/2017   Procedure: Right Heart Cath;  Surgeon: Leonie Man, MD;  Location: [facility];  Service: Cardiovascular;  Laterality: N/A;  . TEE WITHOUT CARDIOVERSION N/A 02/05/2017   Procedure: TRANSESOPHAGEAL ECHOCARDIOGRAM (TEE);  Surgeon: Gaye Pollack, MD;  Location: [facility];  Service: Open Heart Surgery;  Laterality: N/A;    Family History  Problem Relation Age of Onset  . Diabetes Father   . Diabetes Sister     Social History:  reports that she has never smoked. She has never used smokeless tobacco. She reports that she does not drink alcohol or use drugs.  Allergies: No Known Allergies  Medications: I have reviewed the patient's current medications.  Results for orders placed or performed during the hospital encounter of 02/03/17 (from the past 48 hour(s))  Glucose, capillary     Status: Abnormal   Collection Time: 02/21/17  8:27 PM  Result Value Ref Range   Glucose-Capillary 124 (H) 65 - 99  mg/dL  Glucose, capillary     Status: None   Collection Time: 02/21/17 11:27 PM  Result Value Ref Range   Glucose-Capillary 99 65 - 99 mg/dL  Protime-INR     Status: Abnormal   Collection Time: 02/22/17  4:12 AM  Result Value Ref Range   Prothrombin Time 29.7 (H) 11.4 - 15.2 seconds   INR 2.44   Basic metabolic panel     Status: Abnormal   Collection Time: 02/22/17  4:12 AM  Result Value Ref Range   Sodium 146 (H) 135 - 145 mmol/L   Potassium 3.9 3.5 - 5.1 mmol/L    Comment: DELTA CHECK NOTED   Chloride 109 101 - 111 mmol/L   CO2 27 22 - 32 mmol/L   Glucose, Bld 141 (H) 65 - 99 mg/dL   BUN 42 (H) 6 - 20 mg/dL   Creatinine, Ser 2.35 (H) 0.44 - 1.00 mg/dL   Calcium 8.8 (L) 8.9 - 10.3 mg/dL   GFR calc non Af Amer 19 (L) >60 mL/min   GFR calc Af Amer 22 (L) >60 mL/min    Comment: (NOTE) The eGFR has been calculated using the CKD EPI equation. This  calculation has not been validated in all clinical situations. eGFR's persistently <60 mL/min signify possible Chronic Kidney Disease.    Anion gap 10 5 - 15  .Cooxemetry Panel (carboxy, met, total hgb, O2 sat)     Status: Abnormal   Collection Time: 02/22/17  4:20 AM  Result Value Ref Range   Total hemoglobin 10.0 (L) 12.0 - 16.0 g/dL   O2 Saturation 51.9 %   Carboxyhemoglobin 1.4 0.5 - 1.5 %   Methemoglobin 1.1 0.0 - 1.5 %  Glucose, capillary     Status: Abnormal   Collection Time: 02/22/17  4:54 AM  Result Value Ref Range   Glucose-Capillary 129 (H) 65 - 99 mg/dL  Glucose, capillary     Status: Abnormal   Collection Time: 02/22/17  7:29 AM  Result Value Ref Range   Glucose-Capillary 122 (H) 65 - 99 mg/dL  Glucose, capillary     Status: Abnormal   Collection Time: 02/22/17 11:23 AM  Result Value Ref Range   Glucose-Capillary 151 (H) 65 - 99 mg/dL   Comment 1 Notify RN    Comment 2 Document in Chart   Glucose, capillary     Status: Abnormal   Collection Time: 02/22/17  4:23 PM  Result Value Ref Range   Glucose-Capillary 131 (H) 65 - 99 mg/dL  Glucose, capillary     Status: Abnormal   Collection Time: 02/22/17  7:33 PM  Result Value Ref Range   Glucose-Capillary 141 (H) 65 - 99 mg/dL  Glucose, capillary     Status: Abnormal   Collection Time: 02/23/17 12:06 AM  Result Value Ref Range   Glucose-Capillary 132 (H) 65 - 99 mg/dL  Glucose, capillary     Status: Abnormal   Collection Time: 02/23/17  4:19 AM  Result Value Ref Range   Glucose-Capillary 134 (H) 65 - 99 mg/dL  .Cooxemetry Panel (carboxy, met, total hgb, O2 sat)     Status: Abnormal   Collection Time: 02/23/17  5:00 AM  Result Value Ref Range   Total hemoglobin 9.7 (L) 12.0 - 16.0 g/dL   O2 Saturation 72.1 %   Carboxyhemoglobin 1.6 (H) 0.5 - 1.5 %   Methemoglobin 1.1 0.0 - 1.5 %  Protime-INR     Status: Abnormal   Collection Time: 02/23/17  5:27 AM  Result Value Ref Range   Prothrombin Time 25.6 (H) 11.4 -  15.2 seconds   INR 8.75   Basic metabolic panel     Status: Abnormal   Collection Time: 02/23/17  5:27 AM  Result Value Ref Range   Sodium 148 (H) 135 - 145 mmol/L   Potassium 3.4 (L) 3.5 - 5.1 mmol/L   Chloride 111 101 - 111 mmol/L   CO2 29 22 - 32 mmol/L   Glucose, Bld 137 (H) 65 - 99 mg/dL   BUN 43 (H) 6 - 20 mg/dL   Creatinine, Ser 2.44 (H) 0.44 - 1.00 mg/dL   Calcium 8.7 (L) 8.9 - 10.3 mg/dL   GFR calc non Af Amer 18 (L) >60 mL/min   GFR calc Af Amer 21 (L) >60 mL/min    Comment: (NOTE) The eGFR has been calculated using the CKD EPI equation. This calculation has not been validated in all clinical situations. eGFR's persistently <60 mL/min signify possible Chronic Kidney Disease.    Anion gap 8 5 - 15  CBC     Status: Abnormal   Collection Time: 02/23/17  5:27 AM  Result Value Ref Range   WBC 16.4 (H) 4.0 - 10.5 K/uL    Comment: CONSISTENT WITH PREVIOUS RESULT   RBC 3.84 (L) 3.87 - 5.11 MIL/uL   Hemoglobin 9.4 (L) 12.0 - 15.0 g/dL    Comment: CONSISTENT WITH PREVIOUS RESULT   HCT 30.5 (L) 36.0 - 46.0 %   MCV 79.4 78.0 - 100.0 fL   MCH 24.5 (L) 26.0 - 34.0 pg   MCHC 30.8 30.0 - 36.0 g/dL   RDW 24.6 (H) 11.5 - 15.5 %   Platelets 371 150 - 400 K/uL    Comment: CONSISTENT WITH PREVIOUS RESULT  Glucose, capillary     Status: Abnormal   Collection Time: 02/23/17  8:14 AM  Result Value Ref Range   Glucose-Capillary 132 (H) 65 - 99 mg/dL  Glucose, capillary     Status: Abnormal   Collection Time: 02/23/17 11:22 AM  Result Value Ref Range   Glucose-Capillary 178 (H) 65 - 99 mg/dL  Glucose, capillary     Status: Abnormal   Collection Time: 02/23/17  4:26 PM  Result Value Ref Range   Glucose-Capillary 190 (H) 65 - 99 mg/dL    No results found.  Review of Systems  Respiratory: Positive for shortness of breath.   Neurological: Positive for weakness.  All other systems reviewed and are negative.  Blood pressure 137/76, pulse (!) 102, temperature 98.3 F (36.8 C),  temperature source Oral, resp. rate (!) 22, height _0  (1.676 m), weight 177 lb 6.4 oz (80.5 kg), SpO2 100 %. Physical Exam  Constitutional: She is oriented to person, place, and time. She appears well-developed and well-nourished. No distress.  HENT:  Head: Normocephalic and atraumatic.  Right Ear: External ear normal.  Left Ear: External ear normal.  Nose: Nose normal.  Mouth/Throat: Oropharynx is clear and moist.  Upper and lower dentures.  Weak voice with poor breath support.  Some breathiness and raspiness.  Eyes: Conjunctivae and EOM are normal. Pupils are equal, round, and reactive to light.  Neck: Normal range of motion. Neck supple.  Cardiovascular: Normal rate.   Respiratory: Effort normal.  Musculoskeletal: Normal range of motion.  Neurological: She is alert and oriented to person, place, and time. No cranial nerve deficit.  Skin: Skin is warm and dry.  Psychiatric: She has a normal mood and affect. Her behavior is normal.  Judgment and thought content normal.    Assessment/Plan: Left vocal fold paralysis  I discussed her situation with her at length.  This could represent a surgical injury although Dr. Vivi Martens note would suggest otherwise.  In that case, paralysis due to the endotracheal tube may be more likely.  We discussed that function can return in up to 9-12 months, particularly when the nerve has not been cut.  We discussed options for treating her hoarseness and, with it, potentially her dysphagia including temporary and permanent options.  I recommended a temporary medialization with a filler injection while we wait to see if function returns.  Risks, benefits, and alternatives were discussed and she expressed understanding.  We discussed how important breath support is for proper voice use and she is certainly weak due to her surgery and recovery.  If she achieves a safe, sustainable diet, it would be preferable to allow her some time to recover from surgery before  proceeding with voice surgery, if necessary, to allow discernment of the role that a lack of breath support is playing in her degree of hoarseness.  In that case, she can follow-up with me as an outpatient.  Yentl Verge 02/23/2017, 6:42 PM

## 2017-02-23 NOTE — Progress Notes (Signed)
Physical Therapy Treatment Patient Details Name: [name] [name] MRN: [id number] DOB: [birth year] Today's Date: 02/23/2017    History of Present Illness 82 y.o. female S/p emergent CABG x 4 and bioprosthetic MVR for LCX NSTEMI with flail MV on 2/15. PMH significant for DM and HTN.    PT Comments    Pt pleasant and states she is feeling better but still fatigued with bil LE edema and sore tongue. Pt able to recall 2/5 precautions and needs cues to maintain with mobility. Will continue to follow to maximize independence.   HR 110 at rest BP 138/78 145/99 with sitting 149/83 ends of session, HR 123  HR increased to 138 after sitting in chair from walking grossly 1 min after resting with return to 120 after 2 min. No SOB or CP sats 90-93% on RA     Follow Up Recommendations  SNF;Supervision for mobility/OOB     Equipment Recommendations  Rolling walker with 5" wheels    Recommendations for Other Services       Precautions / Restrictions Precautions Precautions: Sternal;Fall Precaution Comments: reviewed sternal precautions pt able to state 2/5    Mobility  Bed Mobility Overal bed mobility: Needs Assistance Bed Mobility: Rolling;Sidelying to Sit Rolling: Supervision Sidelying to sit: Supervision       General bed mobility comments: cues for sequence  Transfers Overall transfer level: Needs assistance   Transfers: Sit to/from Stand   Stand pivot transfers: Min guard       General transfer comment: cues for sternal precautions and hand placement  Ambulation/Gait Ambulation/Gait assistance: Min guard Ambulation Distance (Feet): 130 Feet Assistive device: Rolling walker (2 wheeled) Gait Pattern/deviations: Step-through pattern;Decreased stride length   Gait velocity interpretation: Below normal speed for age/gender General Gait Details: cues for posture, position in RW, activity regulation and breathing technique   Stairs            Wheelchair  Mobility    Modified Rankin (Stroke Patients Only)       Balance Overall balance assessment: Needs assistance   Sitting balance-Leahy Scale: Good       Standing balance-Leahy Scale: Fair                      Cognition Arousal/Alertness: Awake/alert Behavior During Therapy: WFL for tasks assessed/performed Overall Cognitive Status: Within Functional Limits for tasks assessed       Memory: Decreased recall of precautions              Exercises General Exercises - Lower Extremity Long Arc Quad: AROM;Both;Seated;15 reps Hip Flexion/Marching: AROM;Both;Seated;15 reps    General Comments        Pertinent Vitals/Pain Pain Assessment: 0-10 Pain Score: 5  Pain Location: tongue Pain Descriptors / Indicators: Sore Pain Intervention(s): Limited activity within patient's tolerance    Home Living                      Prior Function            PT Goals (current goals can now be found in the care plan section) Progress towards PT goals: Progressing toward goals    Frequency           PT Plan Current plan remains appropriate    Co-evaluation             End of Session Equipment Utilized During Treatment: Gait belt Activity Tolerance: Patient tolerated treatment well Patient left: in chair;with call bell/phone within reach;with family/visitor  present Nurse Communication: Mobility status       Time: 0677-0340 PT Time Calculation (min) (ACUTE ONLY): 23 min  Charges:  $Gait Training: 8-22 mins $Therapeutic Exercise: 8-22 mins                    G Codes:       Shilpa Bushee B Inioluwa Boulay 03/03/2017, 8:13 AM  Elwyn Reach, [place]

## 2017-02-23 NOTE — Progress Notes (Addendum)
[facility] [address]       [place] [address]             [phone]      18 Days Post-Op Procedure(s) (LRB): CORONARY ARTERY BYPASS GRAFTING (CABG) x 4                                                                               LIMA-LAD SEQ SVG-DIAG-OM SVG-PD (N/A) TRANSESOPHAGEAL ECHOCARDIOGRAM (TEE) (N/A) MITRAL VALVE REPLACEMENT (MVR) WITH MAGNA MITRAL EASE PERICARDIAL BIOPROSTHESIS 25 MM (N/A) Subjective:  back in milrinone, feels a little better, some afib  Objective: Vital signs in last 24 hours: Temp:  [97.8 F (36.6 C)-98.6 F (37 C)] 98.3 F (36.8 C) (03/05 0425) Pulse Rate:  [63-126] 115 (03/05 0425) Cardiac Rhythm: Sinus tachycardia (03/05 0208) Resp:  [18-22] 22 (03/05 0425) BP: (129-186)/(58-97) 155/82 (03/05 0425) SpO2:  [94 %-100 %] 95 % (03/05 0425) Weight:  [177 lb 6.4 oz (80.5 kg)] 177 lb 6.4 oz (80.5 kg) (03/05 0425)  Hemodynamic parameters for last 24 hours:    Intake/Output from previous day: 03/04 0701 - 03/05 0700 In: 240 [P.O.:240] Out: -  Intake/Output this shift: No intake/output data recorded.  General appearance: alert, cooperative, fatigued and no distress Heart: irregularly irregular rhythm Lungs: dim in lower fields Abdomen: benign Extremities: + pitting edema in lower fields, + JVD Wound: incis healing well  Lab Results:  Recent Labs  02/23/17 0527  WBC 16.4*  HGB 9.4*  HCT 30.5*  PLT 371   BMET:  Recent Labs  02/22/17 0412 02/23/17 0527  NA 146* 148*  K 3.9 3.4*  CL 109 111  CO2 27 29  GLUCOSE 141* 137*  BUN 42* 43*  CREATININE 2.35* 2.44*  CALCIUM 8.8* 8.7*    PT/INR:  Recent Labs  02/23/17 0527  LABPROT 25.6*  INR 2.29   ABG    Component Value Date/Time   PHART 7.377 02/09/2017 1603   HCO3 23.0 02/09/2017 1603   TCO2 26 02/11/2017 1618   ACIDBASEDEF 2.0 02/09/2017 1603   O2SAT 72.1 02/23/2017 0500   CBG (last 3)   Recent Labs  02/22/17 1933 02/23/17 0006 02/23/17 0419    GLUCAP 141* 132* 134*    Meds Scheduled Meds: . insulin aspart  0-15 Units Subcutaneous Q4H  . mouth rinse  15 mL Mouth Rinse q12n4p  . meropenem (MERREM) IV  500 mg Intravenous Q12H  . moving right along book   Does not apply Once  . nystatin  5 mL Mouth/Throat QID  . pantoprazole (PROTONIX) IV  40 mg Intravenous Q24H  . potassium chloride  40 mEq Oral Daily  . torsemide  40 mg Oral Daily  . Warfarin - Physician Dosing Inpatient   Does not apply q1800   Continuous Infusions: . milrinone 0.25 mcg/kg/min (02/23/17 0406)   PRN Meds:.diphenhydrAMINE, hydrALAZINE, ondansetron (ZOFRAN) IV, RESOURCE THICKENUP CLEAR, sodium chloride flush, traMADol  Xrays Dg Chest 2 View  Result Date: 02/21/2017 CLINICAL DATA:  Shortness of breath. EXAM: CHEST  2 VIEW COMPARISON:  February 19, 2017 FINDINGS: Bilateral pleural effusions, left greater than right, with associated atelectasis. Cardiomegaly. No overt edema. Stable right PICC  line. No other interval changes. IMPRESSION: Small bilateral pleural effusions, left greater than right, with underlying atelectasis. No other interval changes. Electronically Signed   By: Dorise Bullion III M.D   On: 02/21/2017 18:19    Assessment/Plan: S/P Procedure(s) (LRB): CORONARY ARTERY BYPASS GRAFTING (CABG) x 4                                                                               LIMA-LAD SEQ SVG-DIAG-OM SVG-PD (N/A) TRANSESOPHAGEAL ECHOCARDIOGRAM (TEE) (N/A) MITRAL VALVE REPLACEMENT (MVR) WITH MAGNA MITRAL EASE PERICARDIAL BIOPROSTHESIS 25 MM (N/A)  1 on milrinone, Co-OX now 72.1, AHF team assisting with management 2 afib, rapid at times- hesitant to restart amio with liver issues although trends are improved over time- consider digoxin or other agent- defer to cards 3 will give 1 mg coumadin today  LOS: 20 days    GOLD,WAYNE E 02/23/2017   Chart reviewed, patient examined, agree with above. Milrinone started yesterday due to Co-ox 52% and feeling  poorly with EF estimated 25% by echo. Co-ox up to 72 this am. Her previous echo on 2/21 had shown EF of 40% but that was on Milrinone. She appears to have some RV dysfunction on echo yesterday. She is diuresing and wt coming down. Creat stable at 2.4. K+ 3.4 this am and back in atrial fib at times. Will replete. Amio stopped at some point due to concern about liver but she had shock liver preop and that has been recovering postop with transaminases back to normal and bili continues to improve. Will give her a bolus of amio and put her back on po this am. Her main complaint this am is her sore mouth and throat. Her tongue is red and a little swollen with some yeast. She has been on nystatin but that has not resolved this so will stop it and use diflucan. With renal dysfunction will decrease dose. It may effect INR so will give small dose of coumadin. She has had a week of meropenem so will stop it. ENT eval showed left vocal cord paralysis but I am not sure why. CABG/MVR surgery is no where near the left recurrent nerve. She did not have a left neck line. Would follow this for now.

## 2017-02-23 NOTE — Progress Notes (Signed)
  Speech Language Pathology Treatment: Dysphagia  Patient Details Name: [name] [name] MRN: [id number] DOB: [birth year] Today's Date: 02/23/2017 Time: 8242-3536 SLP Time Calculation (min) (ACUTE ONLY): 20 min  Assessment / Plan / Recommendation Clinical Impression  Pt has been evaluated by ENT since last seen by SLP, with dx of left vocal fold paralysis, explaining dysphagia and aspiration per last MBS.  Pt had questions about vocal folds; showed her images and video of normal/disordered function.  Answered questions.  She is tolerating current diet of dys3/honey thick liquids.  Recommend FEES next date prior to D/C in order to determine if postural strategies can help protect airway and allow more liberalized diet.  Pt, granddaughter agree.    HPI HPI: [name] [name] is a 82 year old woman with history of type 2 diabetes mellitus and hypertension, who presented to cardiology clinic due to acute chest pain that began 3 days ago while doing light housework. EKG showed abnormalities concerning for ischemia. Urgent cardiac cath revealed three-vessel CAD, including serial 70 and 80% proximal/mid LAD stenoses. Underwent CABG x 4 2/15 and extubated 2/19. CXR No pneumothorax or pulmonary edema. Left lung base atelectasis and possible small pleural effusion. Previous MBS on 02/13/2017-recommended Dys 1, honey thick. 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       SLP Plan          Recommendations  Diet recommendations: Dysphagia 3 (mechanical soft);Honey-thick liquid Liquids provided via: Cup Medication Administration: Crushed with puree Supervision: Patient able to self feed;Intermittent supervision to cue for compensatory strategies Compensations: Slow rate;Small sips/bites;Multiple dry swallows after each bite/sip Postural Changes and/or Swallow Maneuvers: Seated upright 90 degrees                Oral Care Recommendations:  Oral care BID Follow up Recommendations: Skilled Nursing facility SLP Visit Diagnosis: Dysphagia, pharyngeal phase (R13.13);Dysphagia, oral phase (R13.11)       GO                [name] [name] 02/23/2017, 2:03 PM

## 2017-02-23 NOTE — Progress Notes (Signed)
CARDIAC REHAB PHASE I   PRE:  Rate/Rhythm: 101-111 ST  BP:  Supine:   Sitting: 132/80  Standing:    SaO2: 96%RA  MODE:  Ambulation: 150 ft   POST:  Rate/Rhythm: 126 ST lots of PACs, few PVCs  BP:  Supine:   Sitting: 169/98  Standing:    SaO2: 92%RA 1335-1420 Pt eating when we entered room. Returned after pt finished with lunch. Pt walked 150 ft on RA with rolling walker, gait belt and asst x 2. Had pt stop twice to purse lip breathe but did not seem to help with SOB. Pt very fatigued upon return to room and sat abruptly on bed. Pt dyspneic. Breathing improved with rest. Heart rate elevated with walk and with lots of PACs and few PVCs. Breathing eased after rest and put fan on pt at her request.   Graylon Good, RN BSN  02/23/2017 2:14 PM

## 2017-02-24 LAB — GLUCOSE, CAPILLARY
Glucose-Capillary: 128 mg/dL — ABNORMAL HIGH (ref 65–99)
Glucose-Capillary: 171 mg/dL — ABNORMAL HIGH (ref 65–99)
Glucose-Capillary: 164 mg/dL — ABNORMAL HIGH (ref 65–99)
Glucose-Capillary: 204 mg/dL — ABNORMAL HIGH (ref 65–99)

## 2017-02-24 LAB — COMPREHENSIVE METABOLIC PANEL
ALBUMIN: 2.5 g/dL — AB (ref 3.5–5.0)
ALK PHOS: 105 U/L (ref 38–126)
ALT: 29 U/L (ref 14–54)
AST: 38 U/L (ref 15–41)
Anion gap: 9 (ref 5–15)
BILIRUBIN TOTAL: 2.6 mg/dL — AB (ref 0.3–1.2)
BUN: 44 mg/dL — AB (ref 6–20)
CALCIUM: 8.8 mg/dL — AB (ref 8.9–10.3)
CO2: 30 mmol/L (ref 22–32)
Chloride: 105 mmol/L (ref 101–111)
Creatinine, Ser: 2.48 mg/dL — ABNORMAL HIGH (ref 0.44–1.00)
GFR calc Af Amer: 20 mL/min — ABNORMAL LOW (ref >60)
GFR calc non Af Amer: 17 mL/min — ABNORMAL LOW (ref >60)
Glucose, Bld: 137 mg/dL — ABNORMAL HIGH (ref 65–99)
POTASSIUM: 3.1 mmol/L — AB (ref 3.5–5.1)
Sodium: 144 mmol/L (ref 135–145)
TOTAL PROTEIN: 5.2 g/dL — AB (ref 6.5–8.1)

## 2017-02-24 LAB — COOXEMETRY PANEL
Carboxyhemoglobin: 1.4 % (ref 0.5–1.5)
Methemoglobin: 0.9 % (ref 0.0–1.5)
O2 Saturation: 68.9 %
Total hemoglobin: 9.5 g/dL — ABNORMAL LOW (ref 12.0–16.0)

## 2017-02-24 LAB — PROTIME-INR
INR: 2.59
PROTHROMBIN TIME: 28.3 s — AB (ref 11.4–15.2)

## 2017-02-24 MED ORDER — PANTOPRAZOLE SODIUM 40 MG PO TBEC
40.0000 mg | DELAYED_RELEASE_TABLET | Freq: Every day | ORAL | Status: DC
  Filled 2017-02-24: qty 1

## 2017-02-24 MED ORDER — POTASSIUM CHLORIDE 20 MEQ/15ML (10%) PO SOLN
40.0000 meq | Freq: Two times a day (BID) | ORAL | Status: DC
  Administered 2017-02-24 (×2): 40 meq via ORAL
  Filled 2017-02-24 (×2): qty 30

## 2017-02-24 MED ORDER — POTASSIUM CHLORIDE CRYS ER 20 MEQ PO TBCR: 40.0000 meq | EXTENDED_RELEASE_TABLET | Freq: Two times a day (BID) | ORAL | Status: DC

## 2017-02-24 MED ORDER — WARFARIN SODIUM 1 MG PO TABS
1.0000 mg | ORAL_TABLET | Freq: Once | ORAL | Status: AC
  Administered 2017-02-24: 1 mg via ORAL
  Filled 2017-02-24: qty 1

## 2017-02-24 MED ORDER — AMIODARONE IV BOLUS ONLY 150 MG/100ML
150.0000 mg | Freq: Once | INTRAVENOUS | Status: AC
  Administered 2017-02-24: 150 mg via INTRAVENOUS
  Filled 2017-02-24: qty 100

## 2017-02-24 NOTE — Progress Notes (Addendum)
Nutrition Consult/Follow Up  DOCUMENTATION CODES:   Not applicable  INTERVENTION:    Continue Mighty Shake II TID with meals, each supplement provides 480-500 kcals and 20-23 grams of protein   Prostat liquid protein po 30 ml BID with meals, each supplement provides 100 kcal, 15 grams protein  NUTRITION DIAGNOSIS:   Inadequate oral intake related to dysphagia, poor appetite as evidenced by meal completion < 25%, improved  GOAL:   Patient will meet greater than or equal to 90% of their needs, progressing  MONITOR:   PO intake, Supplement acceptance, Labs, Weight trends, I & O's  ASSESSMENT:   82 yo Feamle with HTN and DM found to have significant three-vessel CAD in the setting of new chest pain with minimal exertion over the last 3 days consistent with unstable angina.   Pt s/p procedures 2/15: CABG, MITRAL VALVE REPAIR  Pt advanced to Dys 3-honey thick liquids. PO intake improved at 50% per flowsheets.. Scheduled for FEES today. Will add liquid protein.  ENT note reviewed 3/2. Pt with vocal cord paralysis, causing hoarseness.  Labs and medications reviewed. CBG's [id number].  Diet Order:  DIET DYS 3 Room service appropriate? Yes with Assist; Fluid consistency: Honey Thick  Skin:  Reviewed, no issues  Last BM:  3/3  Height:   Ht Readings from Last 1 Encounters:  02/19/17 5\' 6"  (1.676 m)    Weight:   Wt Readings from Last 1 Encounters:  02/24/17 180 lb 1.6 oz (81.7 kg)    Ideal Body Weight:  59 kg  BMI:  Body mass index is 29.07 kg/m.  Estimated Nutritional Needs:   Kcal:  1800-2000  Protein:  95-105 gm  Fluid:  1.8-2.0 L  EDUCATION NEEDS:   No education needs identified at this time  Arthur Holms, RD, LDN Pager #: [phone] After-Hours Pager #: [phone]

## 2017-02-24 NOTE — Progress Notes (Addendum)
[address]       [place] [address]             [phone]      19 Days Post-Op Procedure(s) (LRB): CORONARY ARTERY BYPASS GRAFTING (CABG) x 4                                                                               LIMA-LAD SEQ SVG-DIAG-OM SVG-PD (N/A) TRANSESOPHAGEAL ECHOCARDIOGRAM (TEE) (N/A) MITRAL VALVE REPLACEMENT (MVR) WITH MAGNA MITRAL EASE PERICARDIAL BIOPROSTHESIS 25 MM (N/A) Subjective: Feels fairly well, ambulation improving   Objective: Vital signs in last 24 hours: Temp:  [97.3 F (36.3 C)-98.4 F (36.9 C)] 97.3 F (36.3 C) (03/06 0427) Pulse Rate:  [102-115] 103 (03/06 0427) Cardiac Rhythm: Sinus tachycardia (03/05 1900) Resp:  [18-20] 18 (03/06 0427) BP: (128-149)/(75-87) 134/75 (03/06 0427) SpO2:  [93 %-100 %] 96 % (03/06 0427) Weight:  [180 lb 1.6 oz (81.7 kg)] 180 lb 1.6 oz (81.7 kg) (03/06 0427)  Hemodynamic parameters for last 24 hours:    Intake/Output from previous day: 03/05 0701 - 03/06 0700 In: 319.1 [I.V.:319.1] Out: -  Intake/Output this shift: No intake/output data recorded.  General appearance: alert, cooperative and no distress Heart: irregularly irregular rhythm Lungs: dim mildly in lower fields Abdomen: benign Extremities: + edema Wound: incis healing well  Lab Results:  Recent Labs  02/23/17 0527  WBC 16.4*  HGB 9.4*  HCT 30.5*  PLT 371   BMET:  Recent Labs  02/23/17 0527 02/24/17 0437  NA 148* 144  K 3.4* 3.1*  CL 111 105  CO2 29 30  GLUCOSE 137* 137*  BUN 43* 44*  CREATININE 2.44* 2.48*  CALCIUM 8.7* 8.8*    PT/INR:  Recent Labs  02/24/17 0437  LABPROT 28.3*  INR 2.59   ABG    Component Value Date/Time   PHART 7.377 02/09/2017 1603   HCO3 23.0 02/09/2017 1603   TCO2 26 02/11/2017 1618   ACIDBASEDEF 2.0 02/09/2017 1603   O2SAT 68.9 02/24/2017 0530   CBG (last 3)   Recent Labs  02/23/17 1626 02/23/17 2114 02/24/17 0630  GLUCAP 190* 171* 128*     Meds Scheduled Meds: . amiodarone  200 mg Oral BID  . fluconazole  50 mg Oral Daily  . insulin aspart  0-15 Units Subcutaneous TID WC  . mouth rinse  15 mL Mouth Rinse q12n4p  . moving right along book   Does not apply Once  . pantoprazole (PROTONIX) IV  40 mg Intravenous Q24H  . potassium chloride  40 mEq Oral Daily  . torsemide  40 mg Oral Daily  . Warfarin - Physician Dosing Inpatient   Does not apply q1800   Continuous Infusions: . milrinone 0.25 mcg/kg/min (02/23/17 2115)   PRN Meds:.diphenhydrAMINE, hydrALAZINE, ondansetron (ZOFRAN) IV, RESOURCE THICKENUP CLEAR, sodium chloride flush, traMADol  Xrays No results found.  Assessment/Plan: S/P Procedure(s) (LRB): CORONARY ARTERY BYPASS GRAFTING (CABG) x 4  LIMA-LAD SEQ SVG-DIAG-OM SVG-PD (N/A) TRANSESOPHAGEAL ECHOCARDIOGRAM (TEE) (N/A) MITRAL VALVE REPLACEMENT (MVR) WITH MAGNA MITRAL EASE PERICARDIAL BIOPROSTHESIS 25 MM (N/A)  1 slow progress conts 2 Co-ox is 68 on .25 milrinone- cont for now 3 I/O's not accurate , Wt up 3 lbs today if accurate, creat conts a slow rise- needs cont diuresis. Replace K+ further 4 INR 2.5  Will repeat 1 mg coumadin today 5 afib , tachy at times- back on amio 7 T. Bili remains elevated at 2.6, transaminases are normal range    LOS: 21 days    [name],[name] E 02/24/2017   Chart reviewed, patient examined, agree with above. Continues to have atrial fibrillation with RVR. Will give additional amio today. Co-ox good on milrinone but cardiac output going to be optimized in sinus rhythm with normal heart rate. Can't tell if she is diuresing with the Demadex since no I/O. Weight is fairly stable, up and down, still 22 lbs over preop. Creat is trending up which is concerning.

## 2017-02-24 NOTE — Progress Notes (Signed)
CARDIAC REHAB PHASE I   PRE:  Rate/Rhythm: 105 ?afib  Still with some sinus beats   BP:  Supine:   Sitting: 122/74  Standing:    SaO2: 93%RA  MODE:  Ambulation: 150 ft   POST:  Rate/Rhythm: 142 ? afib  BP:  Supine:   Sitting: 149/82  Standing:    SaO2: 97% 2L 0947-0962 A little more difficult to get to standing position today. Had pt rock but needed more assistance as I would not let her use her arms to stand. Tried oxygen with walk to see if it improved her SOB but pt stated it did not help. Had pt sit in hallway to rest to help with dyspnea. Her heart rate was 142. She was very dyspneic and it took a few minutes for her breathing to slow down. After sitting for a while we walked back to room to recliner. Did not take as long to recover since pt had had a rest stop.  Heart rate down after resting. Walked 150 ft on 2L with gait belt use, rolling walker and asst x 1.   Graylon Good, RN BSN  02/24/2017 10:30 AM

## 2017-02-24 NOTE — Progress Notes (Signed)
Speech Pathology     FEES scheduled for today at 1:30    [facility] [place] [employer] [phone]

## 2017-02-24 NOTE — Care Management Important Message (Signed)
Important Message  Patient Details  Name: [name] [name] MRN: [id number] Date of Birth: [birth year]   Medicare Important Message Given:  Yes    [name] Abena 02/24/2017, 11:13 AM

## 2017-02-24 NOTE — Procedures (Signed)
Objective Swallowing Evaluation: Type of Study: FEES-Fiberoptic Endoscopic Evaluation of Swallow  Patient Details  Name: [name] [name] MRN: [id number] Date of Birth: 1935/12/21  Today's Date: 02/24/2017 Time: SLP Start Time (ACUTE ONLY): 1418-SLP Stop Time (ACUTE ONLY): 1430 SLP Time Calculation (min) (ACUTE ONLY): 12 min  Past Medical History:  Past Medical History:  Diagnosis Date  . [name]    a. LHC 02/03/17 for unstable angina showed severe 3v CAD w/ sequential 70% & 80% p-mLAD, 99% mLCx, CTO mRCA, elevated LV filling pressure  . Colon polyp 2010  . Diabetes mellitus with complication ([place])   . Glaucoma   . Hypertension    Past Surgical History:  Past Surgical History:  Procedure Laterality Date  . APPENDECTOMY    . BREAST BIOPSY Right 1999  . COLONOSCOPY  2010  . CORONARY ARTERY BYPASS GRAFT N/A 02/05/2017   Procedure: CORONARY ARTERY BYPASS GRAFTING (CABG) x 4                                                                               LIMA-LAD SEQ SVG-DIAG-OM SVG-PD;  Surgeon: Gaye Pollack, MD;  Location: [facility];  Service: Open Heart Surgery;  Laterality: N/A;  . IABP INSERTION N/A 02/04/2017   Procedure: IABP Insertion;  Surgeon: Leonie Man, MD;  Location: [place] [facility];  Service: Cardiovascular;  Laterality: N/A;  . LEFT HEART CATH AND CORONARY ANGIOGRAPHY Left 02/03/2017   Procedure: Left Heart Cath and Coronary Angiography;  Surgeon: Nelva Bush, MD;  Location: [place] [facility];  Service: Cardiovascular;  Laterality: Left;  . MITRAL VALVE REPAIR N/A 02/05/2017   Procedure: MITRAL VALVE REPLACEMENT (MVR) WITH MAGNA MITRAL EASE PERICARDIAL BIOPROSTHESIS 25 MM;  Surgeon: Gaye Pollack, MD;  Location: [facility];  Service: Open Heart Surgery;  Laterality: N/A;  . PARATHYROIDECTOMY     1986  . RIGHT HEART CATH N/A 02/04/2017   Procedure: Right Heart Cath;  Surgeon: Leonie Man, MD;  Location: [place];  Service: Cardiovascular;   Laterality: N/A;  . TEE WITHOUT CARDIOVERSION N/A 02/05/2017   Procedure: TRANSESOPHAGEAL ECHOCARDIOGRAM (TEE);  Surgeon: Gaye Pollack, MD;  Location: [place] [facility];  Service: Open Heart Surgery;  Laterality: N/A;   HPI: Ms. Janosik is a 82 year old woman with history of type 2 diabetes mellitus and hypertension, who presented to cardiology clinic due to acute chest pain that began 3 days ago while doing light housework. EKG showed abnormalities concerning for ischemia. Urgent cardiac cath revealed three-vessel CAD, including serial 70 and 80% proximal/mid LAD stenoses. Underwent CABG x 4 2/15 and extubated 2/19. CXR No pneumothorax or pulmonary edema. Left lung base atelectasis and possible small pleural effusion. Previous MBS on 02/13/2017-recommended Dys 1, honey thick. 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 FEES today for potential liberalization of diet.    No Data Recorded   Assessment / Plan / Recommendation  CHL IP CLINICAL IMPRESSIONS 02/24/2017  Clinical Impression FEES attempted for potential liberlization of food and liquid textures and consistencies with granddaughter present. Scope placed in right naris with resistance near the velopharyngeal port,  however once scope advanced, unable to view laryngeal structures despite attempts to clear. Scope removed and mucous wiped from end of scope. Two additional passess made (one right naris and one in left) unsuccessfully due to obstruction on end of scope. Ice chips were administered and ineffective to clear pharyngeal/laryngeal mucous. Will plan for repeat MBS next date.    SLP Visit Diagnosis Dysphagia, oropharyngeal phase (R13.12)  Attention and concentration deficit following --  Frontal lobe and executive function deficit following --  Impact on safety and function Moderate aspiration risk      CHL IP TREATMENT RECOMMENDATION 02/24/2017  Treatment Recommendations Therapy as  outlined in treatment plan below     Prognosis 02/24/2017  Prognosis for Safe Diet Advancement Good  Barriers to Reach Goals --  Barriers/Prognosis Comment --    CHL IP DIET RECOMMENDATION 02/24/2017  SLP Diet Recommendations Dysphagia 3 (Mech soft) solids;Honey thick liquids  Liquid Administration via Cup  Medication Administration Crushed with puree  Compensations Slow rate;Small sips/bites;Multiple dry swallows after each bite/sip  Postural Changes Seated upright at 90 degrees      CHL IP OTHER RECOMMENDATIONS 02/24/2017  Recommended Consults --  Oral Care Recommendations Oral care BID  Other Recommendations --      CHL IP FOLLOW UP RECOMMENDATIONS 02/24/2017  Follow up Recommendations Skilled Nursing facility      [facility] IP FREQUENCY AND DURATION 02/24/2017  Speech Therapy Frequency (ACUTE ONLY) min 2x/week  Treatment Duration 2 weeks           CHL IP ORAL PHASE 02/24/2017  Oral Phase WFL  Oral - Pudding Teaspoon --  Oral - Pudding Cup --  Oral - Honey Teaspoon --  Oral - Honey Cup --  Oral - Nectar Teaspoon --  Oral - Nectar Cup --  Oral - Nectar Straw --  Oral - Thin Teaspoon --  Oral - Thin Cup --  Oral - Thin Straw --  Oral - Puree --  Oral - Mech Soft --  Oral - Regular --  Oral - Multi-Consistency --  Oral - Pill --  Oral Phase - Comment --    CHL IP PHARYNGEAL PHASE 02/24/2017  Pharyngeal Phase (No Data)  Pharyngeal- Pudding Teaspoon --  Pharyngeal --  Pharyngeal- Pudding Cup --  Pharyngeal --  Pharyngeal- Honey Teaspoon --  Pharyngeal --  Pharyngeal- Honey Cup --  Pharyngeal --  Pharyngeal- Nectar Teaspoon --  Pharyngeal --  Pharyngeal- Nectar Cup --  Pharyngeal --  Pharyngeal- Nectar Straw --  Pharyngeal --  Pharyngeal- Thin Teaspoon --  Pharyngeal --  Pharyngeal- Thin Cup --  Pharyngeal --  Pharyngeal- Thin Straw --  Pharyngeal --  Pharyngeal- Puree --  Pharyngeal --  Pharyngeal- Mechanical Soft --  Pharyngeal --  Pharyngeal- Regular --   Pharyngeal --  Pharyngeal- Multi-consistency --  Pharyngeal --  Pharyngeal- Pill --  Pharyngeal --  Pharyngeal Comment --     CHL IP CERVICAL ESOPHAGEAL PHASE 02/17/2017  Cervical Esophageal Phase WFL  Pudding Teaspoon --  Pudding Cup --  Honey Teaspoon --  Honey Cup --  Nectar Teaspoon --  Nectar Cup --  Nectar Straw --  Thin Teaspoon --  Thin Cup --  Thin Straw --  Puree --  Mechanical Soft --  Regular --  Multi-consistency --  Pill --  Cervical Esophageal Comment --    No flowsheet data found.  Houston Siren 02/24/2017, 4:07 PM    Orbie Pyo Colvin Caroli.[employer] [phone]

## 2017-02-25 ENCOUNTER — Inpatient Hospital Stay (HOSPITAL_COMMUNITY): Payer: Medicare Other

## 2017-02-25 DIAGNOSIS — I5043 Acute on chronic combined systolic (congestive) and diastolic (congestive) heart failure: Secondary | ICD-10-CM

## 2017-02-25 LAB — BASIC METABOLIC PANEL
Anion gap: 9 (ref 5–15)
BUN: 47 mg/dL — ABNORMAL HIGH (ref 6–20)
CHLORIDE: 105 mmol/L (ref 101–111)
CO2: 29 mmol/L (ref 22–32)
CREATININE: 2.53 mg/dL — AB (ref 0.44–1.00)
Calcium: 8.7 mg/dL — ABNORMAL LOW (ref 8.9–10.3)
GFR, EST AFRICAN AMERICAN: 20 mL/min — AB (ref >60)
GFR, EST NON AFRICAN AMERICAN: 17 mL/min — AB (ref >60)
Glucose, Bld: 154 mg/dL — ABNORMAL HIGH (ref 65–99)
POTASSIUM: 3.5 mmol/L (ref 3.5–5.1)
SODIUM: 143 mmol/L (ref 135–145)

## 2017-02-25 LAB — GLUCOSE, CAPILLARY
GLUCOSE-CAPILLARY: 140 mg/dL — AB (ref 65–99)
GLUCOSE-CAPILLARY: 174 mg/dL — AB (ref 65–99)
GLUCOSE-CAPILLARY: 147 mg/dL — AB (ref 65–99)
GLUCOSE-CAPILLARY: 96 mg/dL (ref 65–99)

## 2017-02-25 LAB — PROTIME-INR
INR: 3.07
Prothrombin Time: 32.4 seconds — ABNORMAL HIGH (ref 11.4–15.2)

## 2017-02-25 LAB — COOXEMETRY PANEL
Carboxyhemoglobin: 1.3 % (ref 0.5–1.5)
Methemoglobin: 0.9 % (ref 0.0–1.5)
O2 SAT: 62.2 %
Total hemoglobin: 9.1 g/dL — ABNORMAL LOW (ref 12.0–16.0)

## 2017-02-25 LAB — MAGNESIUM: MAGNESIUM: 1.8 mg/dL (ref 1.7–2.4)

## 2017-02-25 MED ORDER — AMIODARONE HCL IN DEXTROSE 360-4.14 MG/200ML-% IV SOLN
30.0000 mg/h | INTRAVENOUS | Status: DC
  Administered 2017-02-25 – 2017-02-27 (×4): 30 mg/h via INTRAVENOUS
  Filled 2017-02-25 (×4): qty 200

## 2017-02-25 MED ORDER — POTASSIUM CHLORIDE CRYS ER 20 MEQ PO TBCR
40.0000 meq | EXTENDED_RELEASE_TABLET | Freq: Three times a day (TID) | ORAL | Status: DC
  Administered 2017-02-25 – 2017-03-02 (×17): 40 meq via ORAL
  Filled 2017-02-25 (×19): qty 2

## 2017-02-25 MED ORDER — AMIODARONE HCL IN DEXTROSE 360-4.14 MG/200ML-% IV SOLN
60.0000 mg/h | INTRAVENOUS | Status: AC
  Administered 2017-02-25: 60 mg/h via INTRAVENOUS
  Filled 2017-02-25: qty 200

## 2017-02-25 MED ORDER — AMIODARONE IV BOLUS ONLY 150 MG/100ML
150.0000 mg | Freq: Once | INTRAVENOUS | Status: AC
  Administered 2017-02-25: 150 mg via INTRAVENOUS
  Filled 2017-02-25: qty 100

## 2017-02-25 MED ORDER — PANTOPRAZOLE SODIUM 40 MG PO PACK: 40.0000 mg | PACK | Freq: Every day | ORAL | Status: DC

## 2017-02-25 MED ORDER — POTASSIUM CHLORIDE 20 MEQ/15ML (10%) PO SOLN: 40.0000 meq | Freq: Three times a day (TID) | ORAL | Status: DC

## 2017-02-25 MED ORDER — FUROSEMIDE 10 MG/ML IJ SOLN
80.0000 mg | Freq: Two times a day (BID) | INTRAMUSCULAR | Status: DC
  Administered 2017-02-25 – 2017-03-03 (×14): 80 mg via INTRAVENOUS
  Filled 2017-02-25 (×14): qty 8

## 2017-02-25 MED ORDER — PANTOPRAZOLE SODIUM 40 MG PO TBEC: 40.0000 mg | DELAYED_RELEASE_TABLET | Freq: Every day | ORAL | Status: DC

## 2017-02-25 MED ORDER — PANTOPRAZOLE SODIUM 40 MG PO PACK
40.0000 mg | PACK | Freq: Every day | ORAL | Status: DC
  Administered 2017-02-25 – 2017-03-03 (×7): 40 mg via ORAL
  Filled 2017-02-25 (×7): qty 20

## 2017-02-25 NOTE — Progress Notes (Signed)
PT Cancellation Note  Patient Details Name: [name] [name] MRN: [id number] DOB: 1935/10/15   Cancelled Treatment:    Reason Eval/Treat Not Completed: Patient at procedure or test/unavailable. Will check back as schedule permits.   Yaziel Brandon LUBECK 02/25/2017, 9:16 AM

## 2017-02-25 NOTE — Progress Notes (Signed)
Physical Therapy Treatment Patient Details Name: [name] [name] MRN: [id number] DOB: 02-18-1935 Today's Date: 02/25/2017    History of Present Illness 82 y.o. female S/p emergent CABG x 4 and bioprosthetic MVR for LCX NSTEMI with flail MV on 2/15. PMH significant for DM and HTN.    PT Comments    Pt sleepy today and very fatigued with mobility. HR 105 at rest and into 120's after gait.  Pt fell asleep almost immediately after getting into bed. Con't to recommend SNF.   Follow Up Recommendations  SNF;Supervision for mobility/OOB     Equipment Recommendations  Rolling walker with 5" wheels    Recommendations for Other Services       Precautions / Restrictions Restrictions Weight Bearing Restrictions: Yes Other Position/Activity Restrictions: sternal precautions    Mobility  Bed Mobility   Bed Mobility: Supine to Sit;Sit to Supine     Supine to sit: Min guard Sit to supine: Min guard   General bed mobility comments: cues for technique and decreased use of arms  Transfers Overall transfer level: Needs assistance Equipment used: Rolling walker (2 wheeled) Transfers: Sit to/from Stand Sit to Stand: Min assist Stand pivot transfers: Min guard       General transfer comment: cues for sternal precautions and hand placement. Stood from recliner, and once in bed decided she needed to use BSC, so SPT bed <> BSC  Ambulation/Gait   Ambulation Distance (Feet): 135 Feet Assistive device: Rolling walker (2 wheeled) Gait Pattern/deviations: Step-through pattern;Decreased stride length Gait velocity: decreased   General Gait Details: cues for breathing technique and posture. Weakness noted in LE at end of gait, but did not take any rest breaks.   Stairs            Wheelchair Mobility    Modified Rankin (Stroke Patients Only)       Balance           Standing balance support: During functional activity Standing balance-Leahy Scale: Fair                       Cognition Arousal/Alertness: Awake/alert (but sleepy) Behavior During Therapy: WFL for tasks assessed/performed Overall Cognitive Status: Within Functional Limits for tasks assessed       Memory: Decreased recall of precautions         General Comments: recalled 2/5 sternal precautions    Exercises      General Comments        Pertinent Vitals/Pain Pain Assessment: Faces Faces Pain Scale: No hurt Pain Intervention(s): Monitored during session    Home Living                      Prior Function            PT Goals (current goals can now be found in the care plan section) Acute Rehab PT Goals Patient Stated Goal: get better PT Goal Formulation: With patient Progress towards PT goals: Progressing toward goals    Frequency    Min 3X/week      PT Plan Current plan remains appropriate    Co-evaluation             End of Session Equipment Utilized During Treatment: Gait belt Activity Tolerance: Patient limited by fatigue Patient left: in bed;with call bell/phone within reach Nurse Communication: Mobility status PT Visit Diagnosis: Unsteadiness on feet (R26.81);Muscle weakness (generalized) (M62.81);Difficulty in walking, not elsewhere classified (R26.2)     Time: 3299-2426 PT  Time Calculation (min) (ACUTE ONLY): 21 min  Charges:  $Gait Training: 8-22 mins                    G Codes:       Kyomi Hector LUBECK 02/25/2017, 1:40 PM

## 2017-02-25 NOTE — Progress Notes (Signed)
1430 Came this morning to see but pt getting amio started. Told RN would follow up after lunch. Pt sleeping now as she just walked with PT. Will continue to follow. Graylon Good RN BSN 02/25/2017 2:29 PM

## 2017-02-25 NOTE — Progress Notes (Addendum)
[address]       [place] [address]             [phone]      20 Days Post-Op Procedure(s) (LRB): CORONARY ARTERY BYPASS GRAFTING (CABG) x 4                                                                               LIMA-LAD SEQ SVG-DIAG-OM SVG-PD (N/A) TRANSESOPHAGEAL ECHOCARDIOGRAM (TEE) (N/A) MITRAL VALVE REPLACEMENT (MVR) WITH MAGNA MITRAL EASE PERICARDIAL BIOPROSTHESIS 25 MM (N/A) Subjective: Feels fair, didn't sleep well, + DOE  Objective: Vital signs in last 24 hours: Temp:  [97.7 F (36.5 C)-98 F (36.7 C)] 98 F (36.7 C) (03/07 0600) Pulse Rate:  [69-106] 106 (03/07 0600) Cardiac Rhythm: Other (Comment) (03/06 1900) Resp:  [17-18] 18 (03/07 0600) BP: (120-124)/(68-76) 120/68 (03/07 0600) SpO2:  [91 %-98 %] 98 % (03/07 0600) Weight:  [181 lb 10.5 oz (82.4 kg)] 181 lb 10.5 oz (82.4 kg) (03/07 0600)  Hemodynamic parameters for last 24 hours:    Intake/Output from previous day: 03/06 0701 - 03/07 0700 In: 419.9 [P.O.:120; I.V.:299.9] Out: 1600 [Urine:1600] Intake/Output this shift: No intake/output data recorded.  General appearance: alert, cooperative and no distress Heart: regular rate and rhythm Lungs: improved aeration in bases Abdomen: benign Extremities: + BLE LE edema ( pitting) Wound: incis healing well  Lab Results:  Recent Labs  02/23/17 0527  WBC 16.4*  HGB 9.4*  HCT 30.5*  PLT 371   BMET:  Recent Labs  02/24/17 0437 02/25/17 0405  NA 144 143  K 3.1* 3.5  CL 105 105  CO2 30 29  GLUCOSE 137* 154*  BUN 44* 47*  CREATININE 2.48* 2.53*  CALCIUM 8.8* 8.7*    PT/INR:  Recent Labs  02/25/17 0405  LABPROT 32.4*  INR 3.07   ABG    Component Value Date/Time   PHART 7.377 02/09/2017 1603   HCO3 23.0 02/09/2017 1603   TCO2 26 02/11/2017 1618   ACIDBASEDEF 2.0 02/09/2017 1603   O2SAT 62.2 02/25/2017 0403   CBG (last 3)   Recent Labs  02/24/17 1717 02/24/17 2134 02/25/17 0603  GLUCAP 164* 204*  140*    Meds Scheduled Meds: . amiodarone  200 mg Oral BID  . fluconazole  50 mg Oral Daily  . insulin aspart  0-15 Units Subcutaneous TID WC  . mouth rinse  15 mL Mouth Rinse q12n4p  . moving right along book   Does not apply Once  . pantoprazole  40 mg Oral Daily  . potassium chloride  40 mEq Oral BID  . torsemide  40 mg Oral Daily  . Warfarin - Physician Dosing Inpatient   Does not apply q1800   Continuous Infusions: . milrinone 0.25 mcg/kg/min (02/25/17 0604)   PRN Meds:.diphenhydrAMINE, hydrALAZINE, ondansetron (ZOFRAN) IV, RESOURCE THICKENUP CLEAR, sodium chloride flush, traMADol  Xrays No results found.  Assessment/Plan: S/P Procedure(s) (LRB): CORONARY ARTERY BYPASS GRAFTING (CABG) x 4  LIMA-LAD SEQ SVG-DIAG-OM SVG-PD (N/A) TRANSESOPHAGEAL ECHOCARDIOGRAM (TEE) (N/A) MITRAL VALVE REPLACEMENT (MVR) WITH MAGNA MITRAL EASE PERICARDIAL BIOPROSTHESIS 25 MM (N/A)  1 stable but progress remains slow, on .25 milrinone with CO-Ox 62 today 2 currently sinus tachy low 100's 3 no coumadin today 4 creat conts to slowly rise, cont to replace K+ , UO 1100cc recorded yesterday and 500 cc so far today- wt fairly stable 5 now on d3 diet 6 have asked AHF team to assist with management as they haven't seen in a couple days  LOS: 22 days    GOLD,WAYNE E 02/25/2017   Chart reviewed, patient examined, agree with above. She is stable with adequate Co-ox of 62 on milrinone. On Demedex 40 he weight has been stable over the past week but creat is slowly rising which is concerning. She is still 22 lbs over preop and has significant peripheral edema. Agree with having heart failure team reassess her. Her rhythm is sinus this am on po amio. FEES unsuccessful yesterday so plan repeat MBS.

## 2017-02-25 NOTE — Progress Notes (Signed)
Patient ID: [name] [name], female   DOB: Oct 13, 1935, 82 y.o.   MRN: [id number]     Advanced Heart Failure Rounding Note  PCP: Marden Noble, MD Primary Cardiologist: Dr. Yvone Neu [facility]) HF: Dr. Haroldine Laws   Subjective:    S/Pp emergent CABG x 4 and bioprosthetic MVR for LCX NSTEMI with flail MV on 2/15. Echo with EF 40% RV normal, and stable MVR.  ECHO 3/4 EF 20-25% RV mild-moderately reduced. Trivial pericardial effusion.   ENT consulted 3/5 due to hoarseness. Concern for L vocal cord paralysis.  Meropenum stopped 3/5. CO-OX dropped so milrinone was restarted on 3/5.    On 3/6 having episodes of A fib RVR so po amio increased 200 mg twice a day.   Remains on milrinone 0.25 mcg. Diuresing with torsemide. Weight up 20 pounds from admit.  SOB with exertion. Complaining of fatigue.   Todays CO-OX is 62%. Creatinine stable at 2.53.    Objective:   Weight Range: 181 lb 10.5 oz (82.4 kg) Body mass index is 29.32 kg/m.   Vital Signs:   Temp:  [97.7 F (36.5 C)-98 F (36.7 C)] 98 F (36.7 C) (03/07 0600) Pulse Rate:  [69-106] 106 (03/07 0600) Resp:  [17-18] 18 (03/07 0600) BP: (120-124)/(68-76) 120/68 (03/07 0600) SpO2:  [91 %-98 %] 98 % (03/07 0600) Weight:  [181 lb 10.5 oz (82.4 kg)] 181 lb 10.5 oz (82.4 kg) (03/07 0600) Last BM Date: 02/21/17  Weight change: Filed Weights   02/23/17 0425 02/24/17 0427 02/25/17 0600  Weight: 177 lb 6.4 oz (80.5 kg) 180 lb 1.6 oz (81.7 kg) 181 lb 10.5 oz (82.4 kg)    Intake/Output:   Intake/Output Summary (Last 24 hours) at 02/25/17 0848 Last data filed at 02/25/17 3086  Gross per 24 hour  Intake           269.86 ml  Output             1600 ml  Net         -1330.14 ml     Physical Exam: General:  In the bed. NAD. Remains hoarse.    HEENT: Normal. ? Mild left facial droop and flattening of left nasolabial folds Neck: Supple JVP to jaw.  2+ bilat; no bruits. No lymphadenopathy or thyromegaly appreciated. Cor: PMI  nondisplaced. irregular. No M/G/R noted. Sternal incision approximated   Lungs: Diminished basilar sounds.   Abdomen: soft, NT, ND, no HSM. No bruits or masses. +BS  Extremities: no cyanosis, clubbing, rash. RUE PICC.  R and LLE 2+  Neuro: A&Ox3 follows commands and is oriented. Moves all 4  Telemetry: Personally reviewed, Atrial Tach/A fib. 100s  Labs: CBC  Recent Labs  02/23/17 0527  WBC 16.4*  HGB 9.4*  HCT 30.5*  MCV 79.4  PLT 578   Basic Metabolic Panel  Recent Labs  02/24/17 0437 02/25/17 0405  NA 144 143  K 3.1* 3.5  CL 105 105  CO2 30 29  GLUCOSE 137* 154*  BUN 44* 47*  CREATININE 2.48* 2.53*  CALCIUM 8.8* 8.7*   Liver Function Tests  Recent Labs  02/24/17 0437  AST 38  ALT 29  ALKPHOS 105  BILITOT 2.6*  PROT 5.2*  ALBUMIN 2.5*   No results for input(s): LIPASE, AMYLASE in the last 72 hours. Cardiac Enzymes No results for input(s): CKTOTAL, CKMB, CKMBINDEX, TROPONINI in the last 72 hours.  BNP: BNP (last 3 results) No results for input(s): BNP in the last 8760 hours.  ProBNP (last  3 results) No results for input(s): PROBNP in the last 8760 hours.   D-Dimer No results for input(s): DDIMER in the last 72 hours. Hemoglobin A1C No results for input(s): HGBA1C in the last 72 hours. Fasting Lipid Panel No results for input(s): CHOL, HDL, LDLCALC, TRIG, CHOLHDL, LDLDIRECT in the last 72 hours. Thyroid Function Tests No results for input(s): TSH, T4TOTAL, T3FREE, THYROIDAB in the last 72 hours.  Invalid input(s): FREET3  Other results:     Imaging/Studies:  No results found.    Medications:     Scheduled Medications: . amiodarone  200 mg Oral BID  . fluconazole  50 mg Oral Daily  . insulin aspart  0-15 Units Subcutaneous TID WC  . mouth rinse  15 mL Mouth Rinse q12n4p  . moving right along book   Does not apply Once  . pantoprazole  40 mg Oral Daily  . potassium chloride  40 mEq Oral BID  . torsemide  40 mg Oral Daily  .  Warfarin - Physician Dosing Inpatient   Does not apply q1800    Infusions: . milrinone 0.25 mcg/kg/min (02/25/17 0604)    PRN Medications: diphenhydrAMINE, hydrALAZINE, ondansetron (ZOFRAN) IV, RESOURCE THICKENUP CLEAR, sodium chloride flush, traMADol   Assessment/Plan   1. CAD [name] on 2/15 2. Severe MR with flail segment s/p MVR c/ bioprosthetic valve. 3. Cardiogenic shock: Echo pre-op with EF 40-45%, lateral akinesis, severe MR.     --Echo 2/22 EF 40% MVR ok 4. AKI 5. Shock Liver 6. Acute respiratory failure with hypoxemia requiring intubation 7. Thrombocytopenia: Post-op. 8. Tracheal aspirate with Strep pneumoniae 9. PAF 10. Leukocytosis 11. Hypokalemia 12. L vocal cord paralysis  Co-ox 62%. On milrinone 0.25 mcg. Marked volume overload. Stop torsemide. Start 80 mg IV lasix twice a day. Consider metolazone tomorrow if diuresis sluggish. Increase K 40 meq three times a day. Renal function ok. Watch closely.   In and out of A fib. Looks like atrial tach today.  Restart amio drip while on milrinone.   On coumadin. INR per Dr Cyndia Bent.   Continue statin. No BB for now.    Darrick Grinder, NP  8:48 AM  Advanced Heart Failure Team Pager [phone] (M-F; 7a - 4p)  Please contact [facility] for night-coverage after hours (4p -7a ) and weekends on [email or web address]  Patient seen with NP, agree with the above note.  She is volume overloaded on exam.  She remains on milrinone with stable creatinine at 2.5.   - Agree with Lasix 80 mg IV bid today, follow renal function closely.  May need addition of metolazone.   Has had paroxysmal atrial fibrillation. Today she appears to be in atrial tachycardia versus atypical flutter.  HR 100s.   - Will get ECG to confirm rhythm.  - Would keep amiodarone IV for now while on milrinone.  Liver function parameters stable (mildly elevated).  LFTs in am.   Loralie Champagne  02/25/2017 11:42 AM

## 2017-02-25 NOTE — Progress Notes (Signed)
Modified Barium Swallow Progress Note  Patient Details  Name: [name] [name] MRN: [id number] Date of Birth: 10-Oct-1935  Today's Date: 02/25/2017  Modified Barium Swallow completed.  Full report located under Chart Review in the Imaging Section.  Brief recommendations include the following:  Clinical Impression  [name] [name] continues to present with a sensorimotor pharyngeal dysphagia (repeat MBS). Mild lingual residue with nectar thick which pt cleared with spontaneous multiple swallows.  Decreased sensation and reduced laryngeal elevation, closure and epiglottic inversion led to delayed swallow initiation to valleculae and pyriform sinuses and silent penetration during swallow and one episode before. Verbal cues to cough/throat clear were intermittently beneficial in clearing remaining penetrates. SLP utilized skilled interventions and compensatory strategies (supraglottic swallow, chin tuck, head tilt, head turn) which were ineffective in preventing penetration. One instance of penetration above the level of the cords with honey thick liquids, although most barium was cleared with a reflexive second swallow. Educated pt and family re: diet recommendations and pt's swallowing function. Due to continued penetration risk, left vocal fold paralysis, and decreased sensation, recommend continuing honey thick liquids, Dys 3 solids, meds crushed. ST will continue to f/u for possible diet upgrade, continued education, and compensatory strategies. SNF could consider the free water protocol.    Swallow Evaluation Recommendations       SLP Diet Recommendations: Dysphagia 3 (Mech soft) solids;Honey thick liquids   Liquid Administration via: Cup;No straw   Medication Administration: Crushed with puree   Supervision: Full supervision/cueing for compensatory strategies;Patient able to self feed   Compensations: Slow rate;Small sips/bites;Multiple dry swallows after each bite/sip   Postural Changes:  Seated upright at 90 degrees   Oral Care Recommendations: Oral care BID        Houston Siren 02/25/2017,11:07 AM    Orbie Pyo Colvin Caroli.Ed [employer] [phone]

## 2017-02-26 ENCOUNTER — Inpatient Hospital Stay (HOSPITAL_COMMUNITY): Payer: Medicare Other

## 2017-02-26 LAB — CBC
HCT: 30 % — ABNORMAL LOW (ref 36.0–46.0)
HEMOGLOBIN: 9.2 g/dL — AB (ref 12.0–15.0)
MCH: 24.3 pg — AB (ref 26.0–34.0)
MCHC: 30.7 g/dL (ref 30.0–36.0)
MCV: 79.4 fL (ref 78.0–100.0)
Platelets: 279 10*3/uL (ref 150–400)
RBC: 3.78 MIL/uL — ABNORMAL LOW (ref 3.87–5.11)
RDW: 23.3 % — ABNORMAL HIGH (ref 11.5–15.5)
WBC: 12.8 10*3/uL — ABNORMAL HIGH (ref 4.0–10.5)

## 2017-02-26 LAB — COMPREHENSIVE METABOLIC PANEL
ALBUMIN: 2.4 g/dL — AB (ref 3.5–5.0)
ALK PHOS: 100 U/L (ref 38–126)
ALT: 30 U/L (ref 14–54)
AST: 38 U/L (ref 15–41)
Anion gap: 9 (ref 5–15)
BUN: 45 mg/dL — AB (ref 6–20)
CALCIUM: 8.8 mg/dL — AB (ref 8.9–10.3)
CO2: 29 mmol/L (ref 22–32)
CREATININE: 2.58 mg/dL — AB (ref 0.44–1.00)
Chloride: 105 mmol/L (ref 101–111)
GFR calc non Af Amer: 17 mL/min — ABNORMAL LOW (ref >60)
GFR, EST AFRICAN AMERICAN: 19 mL/min — AB (ref >60)
GLUCOSE: 128 mg/dL — AB (ref 65–99)
Potassium: 3.8 mmol/L (ref 3.5–5.1)
SODIUM: 143 mmol/L (ref 135–145)
Total Bilirubin: 2.3 mg/dL — ABNORMAL HIGH (ref 0.3–1.2)
Total Protein: 4.9 g/dL — ABNORMAL LOW (ref 6.5–8.1)

## 2017-02-26 LAB — COOXEMETRY PANEL
CARBOXYHEMOGLOBIN: 1.2 % (ref 0.5–1.5)
Methemoglobin: 1.2 % (ref 0.0–1.5)
O2 Saturation: 64.2 %
Total hemoglobin: 9.3 g/dL — ABNORMAL LOW (ref 12.0–16.0)

## 2017-02-26 LAB — GLUCOSE, CAPILLARY
GLUCOSE-CAPILLARY: 178 mg/dL — AB (ref 65–99)
GLUCOSE-CAPILLARY: 130 mg/dL — AB (ref 65–99)
Glucose-Capillary: 140 mg/dL — ABNORMAL HIGH (ref 65–99)
Glucose-Capillary: 170 mg/dL — ABNORMAL HIGH (ref 65–99)

## 2017-02-26 LAB — PROTIME-INR
INR: 2.99
PROTHROMBIN TIME: 31.7 s — AB (ref 11.4–15.2)

## 2017-02-26 MED ORDER — METOLAZONE 2.5 MG PO TABS
2.5000 mg | ORAL_TABLET | Freq: Once | ORAL | Status: AC
  Administered 2017-02-26: 2.5 mg via ORAL
  Filled 2017-02-26: qty 1

## 2017-02-26 NOTE — Progress Notes (Signed)
Patient ID: [name] [name], female   DOB: 10/13/1935, 82 y.o.   MRN: [id number]     Advanced Heart Failure Rounding Note  PCP: Marden Noble, MD Primary Cardiologist: Dr. Yvone Neu [facility]) HF: Dr. Haroldine Laws   Subjective:    S/Pp emergent CABG x 4 and bioprosthetic MVR for LCX NSTEMI with flail MV on 2/15. Echo with EF 40% RV normal, and stable MVR.  ECHO 3/4 EF 20-25% RV mild-moderately reduced. Trivial pericardial effusion.   ENT consulted 3/5 due to hoarseness. Concern for L vocal cord paralysis.  Meropenum stopped 3/5. CO-OX dropped so milrinone was restarted on 3/5.    In/out of AF so back on IV amio .   Remains on milrinone 0.25 mcg. Coox 64.2% this am.  States breathing is a little better this am.  Urine output has increased but not significantly per patient. Denies SOB currently. Walking a bit with PT. Still feels weak.   Out 800 cc with switch to IV lasix.  Weight unchanged. Creatinine stable at 2.58.   Objective:   Weight Range: 181 lb 1.6 oz (82.1 kg) Body mass index is 29.23 kg/m.   Vital Signs:   Temp:  [98 F (36.7 C)-98.1 F (36.7 C)] 98 F (36.7 C) (03/08 0429) Pulse Rate:  [98-102] 102 (03/08 0429) Resp:  [18-20] 20 (03/08 0429) BP: (120-141)/(64-80) 141/80 (03/08 0429) SpO2:  [98 %-100 %] 98 % (03/08 0429) Weight:  [181 lb 1.6 oz (82.1 kg)] 181 lb 1.6 oz (82.1 kg) (03/08 0429) Last BM Date: 02/24/17  Weight change: Filed Weights   02/24/17 0427 02/25/17 0600 02/26/17 0429  Weight: 180 lb 1.6 oz (81.7 kg) 181 lb 10.5 oz (82.4 kg) 181 lb 1.6 oz (82.1 kg)    Intake/Output:   Intake/Output Summary (Last 24 hours) at 02/26/17 0741 Last data filed at 02/26/17 0421  Gross per 24 hour  Intake            283.2 ml  Output             1100 ml  Net           -816.8 ml     Physical Exam: General:  Elderly and fatigued appearing. NAD Hoarse.    HEENT: Normal. Neck: Supple, JVP jaw. 2+ bilat; no bruits. No lymphadenopathy or thyromegaly  appreciated. Cor: PMI nondisplaced. Irregular. No M/G/R noted.  Sternal incision approximated   Lungs: Decreased basilar sounds.   Abdomen: soft, NT, ND, no HSM. No bruits or masses. +BS  Extremities: no cyanosis, clubbing, rash. RUE PICC.  R and LLE 2+  Neuro: A&Ox3 follows commands and is oriented. Moves all 4  Telemetry: Personally reviewed, NSR  Labs: CBC  Recent Labs  02/26/17 0426  WBC 12.8*  HGB 9.2*  HCT 30.0*  MCV 79.4  PLT 509   Basic Metabolic Panel  Recent Labs  02/25/17 0405 02/25/17 1030 02/26/17 0426  NA 143  --  143  K 3.5  --  3.8  CL 105  --  105  CO2 29  --  29  GLUCOSE 154*  --  128*  BUN 47*  --  45*  CREATININE 2.53*  --  2.58*  CALCIUM 8.7*  --  8.8*  MG  --  1.8  --    Liver Function Tests  Recent Labs  02/24/17 0437 02/26/17 0426  AST 38 38  ALT 29 30  ALKPHOS 105 100  BILITOT 2.6* 2.3*  PROT 5.2* 4.9*  ALBUMIN 2.5* 2.4*  No results for input(s): LIPASE, AMYLASE in the last 72 hours. Cardiac Enzymes No results for input(s): CKTOTAL, CKMB, CKMBINDEX, TROPONINI in the last 72 hours.  BNP: BNP (last 3 results) No results for input(s): BNP in the last 8760 hours.  ProBNP (last 3 results) No results for input(s): PROBNP in the last 8760 hours.   D-Dimer No results for input(s): DDIMER in the last 72 hours. Hemoglobin A1C No results for input(s): HGBA1C in the last 72 hours. Fasting Lipid Panel No results for input(s): CHOL, HDL, LDLCALC, TRIG, CHOLHDL, LDLDIRECT in the last 72 hours. Thyroid Function Tests No results for input(s): TSH, T4TOTAL, T3FREE, THYROIDAB in the last 72 hours.  Invalid input(s): FREET3  Other results:     Imaging/Studies:  Dg Swallowing Func-speech Pathology  Result Date: 02/25/2017 Objective Swallowing Evaluation: Type of Study: MBS-Modified Barium Swallow Study Patient Details Name: [name] [name] MRN: [id number] Date of Birth: 05/16/1935 Today's Date: 02/25/2017 Time: SLP Start Time (ACUTE  ONLY): 0906-SLP Stop Time (ACUTE ONLY): 0931 SLP Time Calculation (min) (ACUTE ONLY): 25 min Past Medical History: Past Medical History: Diagnosis Date . [name]   a. LHC 02/03/17 for unstable angina showed severe 3v CAD w/ sequential 70% & 80% p-mLAD, 99% mLCx, CTO mRCA, elevated LV filling pressure . Colon polyp 2010 . Diabetes mellitus with complication ([place])  . Glaucoma  . Hypertension  Past Surgical History: Past Surgical History: Procedure Laterality Date . APPENDECTOMY   . BREAST BIOPSY Right 1999 . COLONOSCOPY  2010 . CORONARY ARTERY BYPASS GRAFT N/A 02/05/2017  Procedure: CORONARY ARTERY BYPASS GRAFTING (CABG) x 4                                                                               LIMA-LAD SEQ SVG-DIAG-OM SVG-PD;  Surgeon: Gaye Pollack, MD;  Location: [place] [facility];  Service: Open Heart Surgery;  Laterality: N/A; . IABP INSERTION N/A 02/04/2017  Procedure: IABP Insertion;  Surgeon: Leonie Man, MD;  Location: [place] [facility];  Service: Cardiovascular;  Laterality: N/A; . LEFT HEART CATH AND CORONARY ANGIOGRAPHY Left 02/03/2017  Procedure: Left Heart Cath and Coronary Angiography;  Surgeon: Nelva Bush, MD;  Location: [place] [facility];  Service: Cardiovascular;  Laterality: Left; . MITRAL VALVE REPAIR N/A 02/05/2017  Procedure: MITRAL VALVE REPLACEMENT (MVR) WITH MAGNA MITRAL EASE PERICARDIAL BIOPROSTHESIS 25 MM;  Surgeon: Gaye Pollack, MD;  Location: [place] OR;  Service: Open Heart Surgery;  Laterality: N/A; . PARATHYROIDECTOMY    1986 . RIGHT HEART CATH N/A 02/04/2017  Procedure: Right Heart Cath;  Surgeon: Leonie Man, MD;  Location: [place] [facility];  Service: Cardiovascular;  Laterality: N/A; . TEE WITHOUT CARDIOVERSION N/A 02/05/2017  Procedure: TRANSESOPHAGEAL ECHOCARDIOGRAM (TEE);  Surgeon: Gaye Pollack, MD;  Location: [place];  Service: Open Heart Surgery;  Laterality: N/A; HPI: [name] is a 82 year old woman with history of type 2 diabetes mellitus and  hypertension, who presented to cardiology clinic due to acute chest pain that began 3 days ago while doing light housework. EKG showed abnormalities concerning for ischemia. Urgent cardiac cath revealed three-vessel CAD, including serial 70 and 80% proximal/mid LAD stenoses. Underwent CABG x 4  2/15 and extubated 2/19. CXR No pneumothorax or pulmonary edema. Left lung base atelectasis and possible small pleural effusion. Previous MBS on 02/13/2017-recommended Dys 1, honey thick. Evaluated by ENT on 3/2, dx left vocal cord paralysis in the paramedian position, resulting in a glottic gap. FEES on 02/24/2017 for potential liberalization of diet, however could not visualize laryngeal/pharyngeal area leading to order for repeat MBS.  No Data Recorded Assessment / Plan / Recommendation CHL IP CLINICAL IMPRESSIONS 02/25/2017 Clinical Impression Ms. Prohaska continues to present with a sensorimotor pharyngeal dysphagia (repeat MBS). Mild lingual residue with nectar thick which pt cleared with spontaneous multiple swallows.  Decreased sensation and reduced laryngeal elevation, closure and epiglottic inversion led to delayed swallow initiation to valleculae and pyriform sinuses and silent penetration during swallow and one episode before. Verbal cues to cough/throat clear were intermittently beneficial in clearing remaining penetrates. SLP utilized skilled interventions and compensatory strategies (supraglottic swallow, chin tuck, head tilt, head turn) which were ineffective in preventing penetration. One instance of penetration above the level of the cords with honey thick liquids, although most barium was cleared with a reflexive second swallow. Educated pt and family re: diet recommendations and pt's swallowing function. Due to continued penetration risk, left vocal fold paralysis, and decreased sensation, recommend continuing honey thick liquids, Dys 3 solids, meds crushed. ST will continue to f/u for possible diet upgrade,  continued education, and compensatory strategies. SNF could consider the free water protocol.  SLP Visit Diagnosis Dysphagia, pharyngeal phase (R13.13) Attention and concentration deficit following -- Frontal lobe and executive function deficit following -- Impact on safety and function Moderate aspiration risk   CHL IP TREATMENT RECOMMENDATION 02/25/2017 Treatment Recommendations Therapy as outlined in treatment plan below   Prognosis 02/25/2017 Prognosis for Safe Diet Advancement Good Barriers to Reach Goals -- Barriers/Prognosis Comment -- CHL IP DIET RECOMMENDATION 02/25/2017 SLP Diet Recommendations Dysphagia 3 (Mech soft) solids;Honey thick liquids Liquid Administration via Cup;No straw Medication Administration Crushed with puree Compensations Slow rate;Small sips/bites;Multiple dry swallows after each bite/sip Postural Changes Seated upright at 90 degrees   CHL IP OTHER RECOMMENDATIONS 02/25/2017 Recommended Consults -- Oral Care Recommendations Oral care BID Other Recommendations --   CHL IP FOLLOW UP RECOMMENDATIONS 02/25/2017 Follow up Recommendations Skilled Nursing facility   [facility] IP FREQUENCY AND DURATION 02/25/2017 Speech Therapy Frequency (ACUTE ONLY) min 2x/week Treatment Duration 2 weeks      CHL IP ORAL PHASE 02/25/2017 Oral Phase WFL Oral - Pudding Teaspoon -- Oral - Pudding Cup -- Oral - Honey Teaspoon NT Oral - Honey Cup WFL Oral - Nectar Teaspoon -- Oral - Nectar Cup Lingual/palatal residue Oral - Nectar Straw -- Oral - Thin Teaspoon -- Oral - Thin Cup NT Oral - Thin Straw -- Oral - Puree NT Oral - Mech Soft -- Oral - Regular WFL Oral - Multi-Consistency -- Oral - Pill -- Oral Phase - Comment --  CHL IP PHARYNGEAL PHASE 02/25/2017 Pharyngeal Phase Impaired Pharyngeal- Pudding Teaspoon -- Pharyngeal -- Pharyngeal- Pudding Cup -- Pharyngeal -- Pharyngeal- Honey Teaspoon NT Pharyngeal -- Pharyngeal- Honey Cup Delayed swallow initiation-pyriform sinuses;Delayed swallow initiation-vallecula;Penetration/Aspiration  during swallow Pharyngeal Material enters airway, remains ABOVE vocal cords and not ejected out Pharyngeal- Nectar Teaspoon -- Pharyngeal -- Pharyngeal- Nectar Cup Penetration/Aspiration during swallow;Reduced airway/laryngeal closure;Reduced laryngeal elevation;Penetration/Aspiration before swallow;Reduced epiglottic inversion Pharyngeal Material enters airway, CONTACTS cords and not ejected out;Material enters airway, remains ABOVE vocal cords and not ejected out;Material enters airway, remains ABOVE vocal cords then ejected out Pharyngeal- Nectar Straw --  Pharyngeal -- Pharyngeal- Thin Teaspoon -- Pharyngeal -- Pharyngeal- Thin Cup NT Pharyngeal -- Pharyngeal- Thin Straw -- Pharyngeal -- Pharyngeal- Puree NT Pharyngeal -- Pharyngeal- Mechanical Soft -- Pharyngeal -- Pharyngeal- Regular WFL Pharyngeal -- Pharyngeal- Multi-consistency -- Pharyngeal -- Pharyngeal- Pill -- Pharyngeal -- Pharyngeal Comment --  CHL IP CERVICAL ESOPHAGEAL PHASE 02/25/2017 Cervical Esophageal Phase WFL Pudding Teaspoon -- Pudding Cup -- Honey Teaspoon -- Honey Cup -- Nectar Teaspoon -- Nectar Cup -- Nectar Straw -- Thin Teaspoon -- Thin Cup -- Thin Straw -- Puree -- Mechanical Soft -- Regular -- Multi-consistency -- Pill -- Cervical Esophageal Comment -- No flowsheet data found. Houston Siren 02/25/2017, 11:06 AM Orbie Pyo Colvin Caroli.Ed CCC-SLP Pager [phone]                 Medications:     Scheduled Medications: . fluconazole  50 mg Oral Daily  . furosemide  80 mg Intravenous BID  . insulin aspart  0-15 Units Subcutaneous TID WC  . mouth rinse  15 mL Mouth Rinse q12n4p  . moving right along book   Does not apply Once  . pantoprazole sodium  40 mg Oral Daily  . potassium chloride  40 mEq Oral TID  . Warfarin - Physician Dosing Inpatient   Does not apply q1800    Infusions: . amiodarone 30 mg/hr (02/26/17 0421)  . milrinone 0.25 mcg/kg/min (02/26/17 0054)    PRN Medications: diphenhydrAMINE, hydrALAZINE,  ondansetron (ZOFRAN) IV, RESOURCE THICKENUP CLEAR, sodium chloride flush, traMADol   Assessment/Plan   1. CAD [name] on 2/15 2. Severe MR with flail segment s/p MVR c/ bioprosthetic valve. 3. Cardiogenic shock: Echo pre-op with EF 40-45%, lateral akinesis, severe MR.     --Echo 2/22 EF 40% MVR ok 4. AKI 5. Shock Liver 6. Acute respiratory failure with hypoxemia requiring intubation 7. Thrombocytopenia: Post-op. 8. Tracheal aspirate with Strep pneumoniae 9. PAF 10. Leukocytosis 11. Hypokalemia 12. L vocal cord paralysis  Co-ox 64.2% on milrinone 0.25 mcg. Remains markedly volume overloaded on exam.    Continue lasix 80 mg IV BID.  Add 2.5 mg metolazone this am and follow response.   Will add CVPs  Continue K supp 40 meq TID. Creatinine stable. Follow closely.    In and out of A fib. Continue amio drip while on milrinone. LFTs WNL.   On coumadin. INR per Dr Cyndia Bent.   Continue statin. No BB for now on milrinone.   Shirley Friar, PA-C  7:41 AM  Advanced Heart Failure Team Pager [phone] (M-F; 7a - 4p)  Please contact [facility] for night-coverage after hours (4p -7a ) and weekends on [email or web address]  Patient seen and examined with Oda Kilts, PA-C. We discussed all aspects of the encounter. I agree with the assessment and plan as stated above.   Overall making progress but she continues to struggle with volume overload, PAF and very tenuous renal function.   Agree with continuing milrinone for now. Continue IV lasix and metolazone. Watch electrolytes and renal function closely.   Remaining NSR on IV amio. Will continue IV amio at least one more day (particualrly while on milrinone).   Supp K.   Continue statin for CAD.  No ASA or b-blocker with coumadin and low output.   Continue PT.   Bensimhon, Daniel,MD 10:05 AM

## 2017-02-26 NOTE — Progress Notes (Signed)
Jadene Pierini, [place] notified of patient's prolonged QTC on tele, no new orders received will continue to monitor

## 2017-02-26 NOTE — Progress Notes (Addendum)
[address]       [place] [address]             [phone]      21 Days Post-Op Procedure(s) (LRB): CORONARY ARTERY BYPASS GRAFTING (CABG) x 4                                                                               LIMA-LAD SEQ SVG-DIAG-OM SVG-PD (N/A) TRANSESOPHAGEAL ECHOCARDIOGRAM (TEE) (N/A) MITRAL VALVE REPLACEMENT (MVR) WITH MAGNA MITRAL EASE PERICARDIAL BIOPROSTHESIS 25 MM (N/A) Subjective: Feels  a little better   Objective: Vital signs in last 24 hours: Temp:  [98 F (36.7 C)-98.1 F (36.7 C)] 98 F (36.7 C) (03/08 0429) Pulse Rate:  [98-102] 102 (03/08 0429) Cardiac Rhythm: Normal sinus rhythm (03/07 1900) Resp:  [18-20] 20 (03/08 0429) BP: (120-141)/(64-80) 141/80 (03/08 0429) SpO2:  [98 %-100 %] 98 % (03/08 0429) Weight:  [181 lb 1.6 oz (82.1 kg)] 181 lb 1.6 oz (82.1 kg) (03/08 0429)  Hemodynamic parameters for last 24 hours:    Intake/Output from previous day: 03/07 0701 - 03/08 0700 In: 283.2 [I.V.:283.2] Out: 1100 [Urine:1100] Intake/Output this shift: No intake/output data recorded.  General appearance: alert, cooperative and no distress Heart: regular rate and rhythm Lungs: slightly coarse, dim in lower fields bilat Abdomen: benign Extremities: less edema Wound: incis healing well  Lab Results:  Recent Labs  02/26/17 0426  WBC 12.8*  HGB 9.2*  HCT 30.0*  PLT 279   BMET:  Recent Labs  02/25/17 0405 02/26/17 0426  NA 143 143  K 3.5 3.8  CL 105 105  CO2 29 29  GLUCOSE 154* 128*  BUN 47* 45*  CREATININE 2.53* 2.58*  CALCIUM 8.7* 8.8*    PT/INR:  Recent Labs  02/26/17 0426  LABPROT 31.7*  INR 2.99   ABG    Component Value Date/Time   PHART 7.377 02/09/2017 1603   HCO3 23.0 02/09/2017 1603   TCO2 26 02/11/2017 1618   ACIDBASEDEF 2.0 02/09/2017 1603   O2SAT 64.2 02/26/2017 0432   CBG (last 3)   Recent Labs  02/25/17 1623 02/25/17 2125 02/26/17 0633  GLUCAP 147* 96 140*     Meds Scheduled Meds: . fluconazole  50 mg Oral Daily  . furosemide  80 mg Intravenous BID  . insulin aspart  0-15 Units Subcutaneous TID WC  . mouth rinse  15 mL Mouth Rinse q12n4p  . moving right along book   Does not apply Once  . pantoprazole sodium  40 mg Oral Daily  . potassium chloride  40 mEq Oral TID  . Warfarin - Physician Dosing Inpatient   Does not apply q1800   Continuous Infusions: . amiodarone 30 mg/hr (02/26/17 0421)  . milrinone 0.25 mcg/kg/min (02/26/17 0054)   PRN Meds:.diphenhydrAMINE, hydrALAZINE, ondansetron (ZOFRAN) IV, RESOURCE THICKENUP CLEAR, sodium chloride flush, traMADol  Xrays Dg Swallowing Func-speech Pathology  Result Date: 02/25/2017 Objective Swallowing Evaluation: Type of Study: MBS-Modified Barium Swallow Study Patient Details Name: [name] [name] MRN: [id number] Date of Birth: December 31, 1934 Today's Date: 02/25/2017 Time: SLP Start Time (ACUTE ONLY): 0906-SLP Stop Time (ACUTE ONLY): 0931 SLP Time Calculation (min) (ACUTE ONLY): 25  min Past Medical History: Past Medical History: Diagnosis Date . [name]   a. LHC 02/03/17 for unstable angina showed severe 3v CAD w/ sequential 70% & 80% p-mLAD, 99% mLCx, CTO mRCA, elevated LV filling pressure . Colon polyp 2010 . Diabetes mellitus with complication ([place])  . Glaucoma  . Hypertension  Past Surgical History: Past Surgical History: Procedure Laterality Date . APPENDECTOMY   . BREAST BIOPSY Right 1999 . COLONOSCOPY  2010 . CORONARY ARTERY BYPASS GRAFT N/A 02/05/2017  Procedure: CORONARY ARTERY BYPASS GRAFTING (CABG) x 4                                                                               LIMA-LAD SEQ SVG-DIAG-OM SVG-PD;  Surgeon: Gaye Pollack, MD;  Location: [facility];  Service: Open Heart Surgery;  Laterality: N/A; . IABP INSERTION N/A 02/04/2017  Procedure: IABP Insertion;  Surgeon: Leonie Man, MD;  Location: [facility];  Service: Cardiovascular;  Laterality: N/A; . LEFT HEART CATH AND  CORONARY ANGIOGRAPHY Left 02/03/2017  Procedure: Left Heart Cath and Coronary Angiography;  Surgeon: Nelva Bush, MD;  Location: [facility];  Service: Cardiovascular;  Laterality: Left; . MITRAL VALVE REPAIR N/A 02/05/2017  Procedure: MITRAL VALVE REPLACEMENT (MVR) WITH MAGNA MITRAL EASE PERICARDIAL BIOPROSTHESIS 25 MM;  Surgeon: Gaye Pollack, MD;  Location: [facility];  Service: Open Heart Surgery;  Laterality: N/A; . PARATHYROIDECTOMY    1986 . RIGHT HEART CATH N/A 02/04/2017  Procedure: Right Heart Cath;  Surgeon: Leonie Man, MD;  Location: [facility];  Service: Cardiovascular;  Laterality: N/A; . TEE WITHOUT CARDIOVERSION N/A 02/05/2017  Procedure: TRANSESOPHAGEAL ECHOCARDIOGRAM (TEE);  Surgeon: Gaye Pollack, MD;  Location: [facility];  Service: Open Heart Surgery;  Laterality: N/A; HPI: [name] [name] is a 82 year old woman with history of type 2 diabetes mellitus and hypertension, who presented to cardiology clinic due to acute chest pain that began 3 days ago while doing light housework. EKG showed abnormalities concerning for ischemia. Urgent cardiac cath revealed three-vessel CAD, including serial 70 and 80% proximal/mid LAD stenoses. Underwent CABG x 4 2/15 and extubated 2/19. CXR No pneumothorax or pulmonary edema. Left lung base atelectasis and possible small pleural effusion. Previous MBS on 02/13/2017-recommended Dys 1, honey thick. Evaluated by ENT on 3/2, dx left vocal cord paralysis in the paramedian position, resulting in a glottic gap. FEES on 02/24/2017 for potential liberalization of diet, however could not visualize laryngeal/pharyngeal area leading to order for repeat MBS.  No Data Recorded Assessment / Plan / Recommendation CHL IP CLINICAL IMPRESSIONS 02/25/2017 Clinical Impression [name] [name] continues to present with a sensorimotor pharyngeal dysphagia (repeat MBS). Mild lingual residue with nectar thick which pt cleared with spontaneous multiple swallows.  Decreased sensation  and reduced laryngeal elevation, closure and epiglottic inversion led to delayed swallow initiation to valleculae and pyriform sinuses and silent penetration during swallow and one episode before. Verbal cues to cough/throat clear were intermittently beneficial in clearing remaining penetrates. SLP utilized skilled interventions and compensatory strategies (supraglottic swallow, chin tuck, head tilt, head turn) which were ineffective in preventing penetration. One instance of penetration above the level of the cords with  honey thick liquids, although most barium was cleared with a reflexive second swallow. Educated pt and family re: diet recommendations and pt's swallowing function. Due to continued penetration risk, left vocal fold paralysis, and decreased sensation, recommend continuing honey thick liquids, Dys 3 solids, meds crushed. ST will continue to f/u for possible diet upgrade, continued education, and compensatory strategies. SNF could consider the free water protocol.  SLP Visit Diagnosis Dysphagia, pharyngeal phase (R13.13) Attention and concentration deficit following -- Frontal lobe and executive function deficit following -- Impact on safety and function Moderate aspiration risk   CHL IP TREATMENT RECOMMENDATION 02/25/2017 Treatment Recommendations Therapy as outlined in treatment plan below   Prognosis 02/25/2017 Prognosis for Safe Diet Advancement Good Barriers to Reach Goals -- Barriers/Prognosis Comment -- CHL IP DIET RECOMMENDATION 02/25/2017 SLP Diet Recommendations Dysphagia 3 (Mech soft) solids;Honey thick liquids Liquid Administration via Cup;No straw Medication Administration Crushed with puree Compensations Slow rate;Small sips/bites;Multiple dry swallows after each bite/sip Postural Changes Seated upright at 90 degrees   CHL IP OTHER RECOMMENDATIONS 02/25/2017 Recommended Consults -- Oral Care Recommendations Oral care BID Other Recommendations --   CHL IP FOLLOW UP RECOMMENDATIONS 02/25/2017 Follow  up Recommendations Skilled Nursing facility   [facility] IP FREQUENCY AND DURATION 02/25/2017 Speech Therapy Frequency (ACUTE ONLY) min 2x/week Treatment Duration 2 weeks      CHL IP ORAL PHASE 02/25/2017 Oral Phase WFL Oral - Pudding Teaspoon -- Oral - Pudding Cup -- Oral - Honey Teaspoon NT Oral - Honey Cup WFL Oral - Nectar Teaspoon -- Oral - Nectar Cup Lingual/palatal residue Oral - Nectar Straw -- Oral - Thin Teaspoon -- Oral - Thin Cup NT Oral - Thin Straw -- Oral - Puree NT Oral - Mech Soft -- Oral - Regular WFL Oral - Multi-Consistency -- Oral - Pill -- Oral Phase - Comment --  CHL IP PHARYNGEAL PHASE 02/25/2017 Pharyngeal Phase Impaired Pharyngeal- Pudding Teaspoon -- Pharyngeal -- Pharyngeal- Pudding Cup -- Pharyngeal -- Pharyngeal- Honey Teaspoon NT Pharyngeal -- Pharyngeal- Honey Cup Delayed swallow initiation-pyriform sinuses;Delayed swallow initiation-vallecula;Penetration/Aspiration during swallow Pharyngeal Material enters airway, remains ABOVE vocal cords and not ejected out Pharyngeal- Nectar Teaspoon -- Pharyngeal -- Pharyngeal- Nectar Cup Penetration/Aspiration during swallow;Reduced airway/laryngeal closure;Reduced laryngeal elevation;Penetration/Aspiration before swallow;Reduced epiglottic inversion Pharyngeal Material enters airway, CONTACTS cords and not ejected out;Material enters airway, remains ABOVE vocal cords and not ejected out;Material enters airway, remains ABOVE vocal cords then ejected out Pharyngeal- Nectar Straw -- Pharyngeal -- Pharyngeal- Thin Teaspoon -- Pharyngeal -- Pharyngeal- Thin Cup NT Pharyngeal -- Pharyngeal- Thin Straw -- Pharyngeal -- Pharyngeal- Puree NT Pharyngeal -- Pharyngeal- Mechanical Soft -- Pharyngeal -- Pharyngeal- Regular WFL Pharyngeal -- Pharyngeal- Multi-consistency -- Pharyngeal -- Pharyngeal- Pill -- Pharyngeal -- Pharyngeal Comment --  CHL IP CERVICAL ESOPHAGEAL PHASE 02/25/2017 Cervical Esophageal Phase WFL Pudding Teaspoon -- Pudding Cup -- Honey Teaspoon --  Honey Cup -- Nectar Teaspoon -- Nectar Cup -- Nectar Straw -- Thin Teaspoon -- Thin Cup -- Thin Straw -- Puree -- Mechanical Soft -- Regular -- Multi-consistency -- Pill -- Cervical Esophageal Comment -- No flowsheet data found. [place] Siren 02/25/2017, 11:06 AM Orbie Pyo Colvin Caroli.Ed CCC-SLP Pager [phone]               Assessment/Plan: S/P Procedure(s) (LRB): CORONARY ARTERY BYPASS GRAFTING (CABG) x 4  LIMA-LAD SEQ SVG-DIAG-OM SVG-PD (N/A) TRANSESOPHAGEAL ECHOCARDIOGRAM (TEE) (N/A) MITRAL VALVE REPLACEMENT (MVR) WITH MAGNA MITRAL EASE PERICARDIAL BIOPROSTHESIS 25 MM (N/A)  1 Slow improvement 2 Co-Ox 64, creat conts slow rise- AHF team advancing diuretic therapy including adding Zaroxolyn. UO is improving as is edema. Conts milrinone 3 INR 2.9- hold coumadin again today, T bili improving, 2.3 4 supplementing K+ 5 H/H stable, leukocytosis improved 6 push rehab/pulm toilet as able 7 repeat CXR to re-eval effus 8 D3 diet 9 vocal cord paralysis- ENT plans tx in future  LOS: 23 days    GOLD,WAYNE E 02/26/2017   Chart reviewed, patient examined, agree with above. Hemodynamics stable on Milrinone 0.25 with good Co-ox. Rhythm sinus 100 on IV amio. Frequent PAC's. [name] [name] says [name] [name] feels better today with less shortness of breath with ambulation. Eating lunch well. On IV lasix and metolazone 2.5. Following creat which has slowly been rising.

## 2017-02-26 NOTE — Progress Notes (Signed)
CARDIAC REHAB PHASE I   PRE:  Rate/Rhythm: 18 SR with PAC    BP: sitting 128/77    SaO2: 95 2L  MODE:  Ambulation: 150 ft   POST:  Rate/Rhythm: 112 ST    BP: sitting 160/72     SaO2: 90-91 2L  Pt fairly steady with RW, used 2L. Sts she is less SOB today and she was able to walk around circle without resting. Tired and did not want to increase distance after 150 ft. To recliner, breathless. Remained in [place]. Sts she felt like her breathing was better today. Will f/u. Encouraged more walking later. [id number]   [place], ACSM 02/26/2017 11:46 AM

## 2017-02-27 LAB — PROTIME-INR
INR: 2.93
PROTHROMBIN TIME: 31.2 s — AB (ref 11.4–15.2)

## 2017-02-27 LAB — COMPREHENSIVE METABOLIC PANEL
ALBUMIN: 2.4 g/dL — AB (ref 3.5–5.0)
ALK PHOS: 102 U/L (ref 38–126)
ALT: 27 U/L (ref 14–54)
AST: 38 U/L (ref 15–41)
Anion gap: 8 (ref 5–15)
BILIRUBIN TOTAL: 2.4 mg/dL — AB (ref 0.3–1.2)
BUN: 43 mg/dL — ABNORMAL HIGH (ref 6–20)
CALCIUM: 8.8 mg/dL — AB (ref 8.9–10.3)
CO2: 33 mmol/L — ABNORMAL HIGH (ref 22–32)
CREATININE: 2.41 mg/dL — AB (ref 0.44–1.00)
Chloride: 104 mmol/L (ref 101–111)
GFR calc Af Amer: 21 mL/min — ABNORMAL LOW (ref >60)
GFR, EST NON AFRICAN AMERICAN: 18 mL/min — AB (ref >60)
GLUCOSE: 134 mg/dL — AB (ref 65–99)
POTASSIUM: 3.5 mmol/L (ref 3.5–5.1)
Sodium: 145 mmol/L (ref 135–145)
TOTAL PROTEIN: 5 g/dL — AB (ref 6.5–8.1)

## 2017-02-27 LAB — GLUCOSE, CAPILLARY
GLUCOSE-CAPILLARY: 143 mg/dL — AB (ref 65–99)
GLUCOSE-CAPILLARY: 173 mg/dL — AB (ref 65–99)
GLUCOSE-CAPILLARY: 102 mg/dL — AB (ref 65–99)
Glucose-Capillary: 193 mg/dL — ABNORMAL HIGH (ref 65–99)

## 2017-02-27 LAB — COOXEMETRY PANEL
Carboxyhemoglobin: 1.2 % (ref 0.5–1.5)
Methemoglobin: 1.2 % (ref 0.0–1.5)
O2 Saturation: 66.1 %
Total hemoglobin: 9.1 g/dL — ABNORMAL LOW (ref 12.0–16.0)

## 2017-02-27 MED ORDER — METOLAZONE 2.5 MG PO TABS
2.5000 mg | ORAL_TABLET | Freq: Once | ORAL | Status: AC
  Administered 2017-02-27: 2.5 mg via ORAL
  Filled 2017-02-27: qty 1

## 2017-02-27 MED ORDER — POTASSIUM CHLORIDE CRYS ER 20 MEQ PO TBCR
20.0000 meq | EXTENDED_RELEASE_TABLET | Freq: Once | ORAL | Status: AC
  Administered 2017-02-27: 20 meq via ORAL
  Filled 2017-02-27: qty 1

## 2017-02-27 MED ORDER — ATORVASTATIN CALCIUM 80 MG PO TABS
80.0000 mg | ORAL_TABLET | Freq: Every day | ORAL | Status: DC
  Administered 2017-02-27 – 2017-03-05 (×7): 80 mg via ORAL
  Filled 2017-02-27 (×7): qty 1

## 2017-02-27 MED ORDER — METOLAZONE 2.5 MG PO TABS
2.5000 mg | ORAL_TABLET | Freq: Once | ORAL | Status: AC
  Administered 2017-02-28: 2.5 mg via ORAL
  Filled 2017-02-27: qty 1

## 2017-02-27 MED ORDER — AMIODARONE HCL 200 MG PO TABS
200.0000 mg | ORAL_TABLET | Freq: Two times a day (BID) | ORAL | Status: DC
  Administered 2017-02-27 – 2017-03-05 (×13): 200 mg via ORAL
  Filled 2017-02-27 (×13): qty 1

## 2017-02-27 NOTE — Progress Notes (Signed)
Physical Therapy Treatment Patient Details Name: [name] [name] MRN: [id number] DOB: 12-Nov-1935 Today's Date: 02/27/2017    History of Present Illness 82 y.o. female S/p emergent CABG x 4 and bioprosthetic MVR for LCX NSTEMI with flail MV on 2/15. PMH significant for DM and HTN.    PT Comments    Pt making gradual progress with mobility during PT sessions. Pt requiring min guard assistance with ambulation due to instability. Distance limited by pt reports of fatigue. PT denies any SOB after session, HR elevating into upper 70s upon return to sitting.    Follow Up Recommendations  SNF;Supervision for mobility/OOB     Equipment Recommendations  Rolling walker with 5" wheels    Recommendations for Other Services       Precautions / Restrictions Precautions Precautions: Sternal;Fall Precaution Comments: reviewed precautions Restrictions Weight Bearing Restrictions: Yes (sternal precautions)    Mobility  Bed Mobility               General bed mobility comments: pt sitting in chair upon arrival  Transfers Overall transfer level: Needs assistance Equipment used: Rolling walker (2 wheeled) Transfers: Sit to/from Stand Sit to Stand: Min guard            Ambulation/Gait Ambulation/Gait assistance: Min guard Ambulation Distance (Feet): 150 Feet Assistive device: Rolling walker (2 wheeled) Gait Pattern/deviations: Step-through pattern;Decreased stride length Gait velocity: decreased   General Gait Details: HR in 70s following ambulation, pt declines ambulating any further distance due to fatigue, using 2L O2.    Stairs            Wheelchair Mobility    Modified Rankin (Stroke Patients Only)       Balance Overall balance assessment: Needs assistance Sitting-balance support: Feet supported;No upper extremity supported Sitting balance-Leahy Scale: Good     Standing balance support: Bilateral upper extremity supported Standing balance-Leahy Scale:  Poor Standing balance comment: using rw                    Cognition Arousal/Alertness: Awake/alert Behavior During Therapy: WFL for tasks assessed/performed Overall Cognitive Status: Within Functional Limits for tasks assessed                      Exercises      General Comments        Pertinent Vitals/Pain Pain Assessment: No/denies pain    Home Living                      Prior Function            PT Goals (current goals can now be found in the care plan section) Acute Rehab PT Goals Patient Stated Goal: not expressed PT Goal Formulation: With patient Progress towards PT goals: Progressing toward goals    Frequency    Min 3X/week      PT Plan Current plan remains appropriate    Co-evaluation             End of Session Equipment Utilized During Treatment: Gait belt;Oxygen Activity Tolerance: Patient limited by fatigue Patient left: in chair;with call bell/phone within reach;with family/visitor present Nurse Communication: Mobility status PT Visit Diagnosis: Unsteadiness on feet (R26.81);Muscle weakness (generalized) (M62.81);Difficulty in walking, not elsewhere classified (R26.2)     Time: 6294-7654 PT Time Calculation (min) (ACUTE ONLY): 15 min  Charges:  $Gait Training: 8-22 mins  G Codes:       Cassell Clement, PT, CSCS Pager [phone] Office [phone]  02/27/2017, 3:50 PM

## 2017-02-27 NOTE — Progress Notes (Signed)
CARDIAC REHAB PHASE I   PRE:  Rate/Rhythm: 87 SR  BP:  Supine:   Sitting: 130/73  Standing:    SaO2: 97% 2L  MODE:  Ambulation: 150 ft   POST:  Rate/Rhythm: 106 ST  BP:  Supine:   Sitting: 149/78  Standing:    SaO2: 97%RA 1024-1045 Pt walked 150 ft on 2L with gait belt use, rolling walker and asst x 1. Pt tolerated better today. Not as SOB. Tired by end of walk though. Did not stop to rest. To recliner after walk. Left on oxygen for comfort. Call bell in reach.   Graylon Good, RN BSN  02/27/2017 11:58 AM

## 2017-02-27 NOTE — Progress Notes (Addendum)
22 Days Post-Op Procedure(s) (LRB): CORONARY ARTERY BYPASS GRAFTING (CABG) x 4                                                                               LIMA-LAD SEQ SVG-DIAG-OM SVG-PD (N/A) TRANSESOPHAGEAL ECHOCARDIOGRAM (TEE) (N/A) MITRAL VALVE REPLACEMENT (MVR) WITH MAGNA MITRAL EASE PERICARDIAL BIOPROSTHESIS 25 MM (N/A) Subjective: Feels about the same, more urine output, less DOE  Objective: Vital signs in last 24 hours: Temp:  [97.4 F (36.3 C)-98 F (36.7 C)] 98 F (36.7 C) (03/09 0449) Pulse Rate:  [91-99] 93 (03/09 0449) Cardiac Rhythm: Normal sinus rhythm (03/08 2046) Resp:  [18] 18 (03/09 0449) BP: (118-131)/(60-76) 125/71 (03/09 0449) SpO2:  [97 %-100 %] 99 % (03/09 0449) Weight:  [176 lb 4.8 oz (80 kg)] 176 lb 4.8 oz (80 kg) (03/09 0449)  Hemodynamic parameters for last 24 hours:    Intake/Output from previous day: 03/08 0701 - 03/09 0700 In: 840.6 [P.O.:240; I.V.:600.6] Out: 800 [Urine:800] Intake/Output this shift: No intake/output data recorded.  General appearance: alert, cooperative and no distress Heart: regular rate and rhythm Lungs: dim in lower fields Abdomen: benign Extremities: + BLE pitting edema Wound: incis healing well  Lab Results:  Recent Labs  02/26/17 0426  WBC 12.8*  HGB 9.2*  HCT 30.0*  PLT 279   BMET:  Recent Labs  02/26/17 0426 02/27/17 0436  NA 143 145  K 3.8 3.5  CL 105 104  CO2 29 33*  GLUCOSE 128* 134*  BUN 45* 43*  CREATININE 2.58* 2.41*  CALCIUM 8.8* 8.8*    PT/INR:  Recent Labs  02/27/17 0436  LABPROT 31.2*  INR 2.93   ABG    Component Value Date/Time   PHART 7.377 02/09/2017 1603   HCO3 23.0 02/09/2017 1603   TCO2 26 02/11/2017 1618   ACIDBASEDEF 2.0 02/09/2017 1603   O2SAT 66.1 02/27/2017 0445   CBG (last 3)   Recent Labs  02/26/17 1557 02/26/17 2125 02/27/17 0642  GLUCAP 178* 130* 143*    Meds Scheduled Meds: . fluconazole  50 mg Oral Daily  . furosemide  80 mg Intravenous BID   . insulin aspart  0-15 Units Subcutaneous TID WC  . mouth rinse  15 mL Mouth Rinse q12n4p  . metolazone  2.5 mg Oral Once  . moving right along book   Does not apply Once  . pantoprazole sodium  40 mg Oral Daily  . potassium chloride  40 mEq Oral TID  . Warfarin - Physician Dosing Inpatient   Does not apply q1800   Continuous Infusions: . amiodarone 30 mg/hr (02/27/17 0646)  . milrinone 0.25 mcg/kg/min (02/27/17 0714)   PRN Meds:.diphenhydrAMINE, hydrALAZINE, ondansetron (ZOFRAN) IV, RESOURCE THICKENUP CLEAR, sodium chloride flush, traMADol  Xrays Dg Chest 2 View  Result Date: 02/26/2017 CLINICAL DATA:  Shortness of breath today. Patient status post CABG 02/06/2017. EXAM: CHEST  2 VIEW COMPARISON:  PA and lateral chest 02/21/2017. FINDINGS: Right PICC remains in place. 7 intact median sternotomy wires are unchanged. Mitral valve replacement is noted. Small bilateral pleural effusions and basilar atelectasis, both worse on the left, are mildly increased since the comparison examination. No pneumothorax. No pulmonary edema. IMPRESSION: Some  increase in small bilateral pleural effusions and basilar airspace disease, worse on the left. Electronically Signed   By: Inge Rise M.D.   On: 02/26/2017 09:53   Dg Swallowing Func-speech Pathology  Result Date: 02/25/2017 Objective Swallowing Evaluation: Type of Study: MBS-Modified Barium Swallow Study Patient Details Name: [name] [name] MRN: [id number] Date of Birth: 14-May-1935 Today's Date: 02/25/2017 Time: SLP Start Time (ACUTE ONLY): 0906-SLP Stop Time (ACUTE ONLY): 0931 SLP Time Calculation (min) (ACUTE ONLY): 25 min Past Medical History: Past Medical History: Diagnosis Date . [name]   a. LHC 02/03/17 for unstable angina showed severe 3v CAD w/ sequential 70% & 80% p-mLAD, 99% mLCx, CTO mRCA, elevated LV filling pressure . Colon polyp 2010 . Diabetes mellitus with complication ([place])  . Glaucoma  . Hypertension  Past Surgical History:  Past Surgical History: Procedure Laterality Date . APPENDECTOMY   . BREAST BIOPSY Right 1999 . COLONOSCOPY  2010 . CORONARY ARTERY BYPASS GRAFT N/A 02/05/2017  Procedure: CORONARY ARTERY BYPASS GRAFTING (CABG) x 4                                                                               LIMA-LAD SEQ SVG-DIAG-OM SVG-PD;  Surgeon: Gaye Pollack, MD;  Location: [facility];  Service: Open Heart Surgery;  Laterality: N/A; . IABP INSERTION N/A 02/04/2017  Procedure: IABP Insertion;  Surgeon: Leonie Man, MD;  Location: [place];  Service: Cardiovascular;  Laterality: N/A; . LEFT HEART CATH AND CORONARY ANGIOGRAPHY Left 02/03/2017  Procedure: Left Heart Cath and Coronary Angiography;  Surgeon: Nelva Bush, MD;  Location: [place] [place] [facility];  Service: Cardiovascular;  Laterality: Left; . MITRAL VALVE REPAIR N/A 02/05/2017  Procedure: MITRAL VALVE REPLACEMENT (MVR) WITH MAGNA MITRAL EASE PERICARDIAL BIOPROSTHESIS 25 MM;  Surgeon: Gaye Pollack, MD;  Location: [facility];  Service: Open Heart Surgery;  Laterality: N/A; . PARATHYROIDECTOMY    1986 . RIGHT HEART CATH N/A 02/04/2017  Procedure: Right Heart Cath;  Surgeon: Leonie Man, MD;  Location: [facility];  Service: Cardiovascular;  Laterality: N/A; . TEE WITHOUT CARDIOVERSION N/A 02/05/2017  Procedure: TRANSESOPHAGEAL ECHOCARDIOGRAM (TEE);  Surgeon: Gaye Pollack, MD;  Location: [facility];  Service: Open Heart Surgery;  Laterality: N/A; HPI: [name] is a 82 year old woman with history of type 2 diabetes mellitus and hypertension, who presented to cardiology clinic due to acute chest pain that began 3 days ago while doing light housework. EKG showed abnormalities concerning for ischemia. Urgent cardiac cath revealed three-vessel CAD, including serial 70 and 80% proximal/mid LAD stenoses. Underwent CABG x 4 2/15 and extubated 2/19. CXR No pneumothorax or pulmonary edema. Left lung base atelectasis and possible small pleural effusion. Previous MBS on  02/13/2017-recommended Dys 1, honey thick. Evaluated by ENT on 3/2, dx left vocal cord paralysis in the paramedian position, resulting in a glottic gap. FEES on 02/24/2017 for potential liberalization of diet, however could not visualize laryngeal/pharyngeal area leading to order for repeat MBS.  No Data Recorded Assessment / Plan / Recommendation CHL IP CLINICAL IMPRESSIONS 02/25/2017 Clinical Impression Ms. Wisenbaker continues to present with a sensorimotor pharyngeal dysphagia (repeat MBS). Mild lingual residue with nectar  thick which pt cleared with spontaneous multiple swallows.  Decreased sensation and reduced laryngeal elevation, closure and epiglottic inversion led to delayed swallow initiation to valleculae and pyriform sinuses and silent penetration during swallow and one episode before. Verbal cues to cough/throat clear were intermittently beneficial in clearing remaining penetrates. SLP utilized skilled interventions and compensatory strategies (supraglottic swallow, chin tuck, head tilt, head turn) which were ineffective in preventing penetration. One instance of penetration above the level of the cords with honey thick liquids, although most barium was cleared with a reflexive second swallow. Educated pt and family re: diet recommendations and pt's swallowing function. Due to continued penetration risk, left vocal fold paralysis, and decreased sensation, recommend continuing honey thick liquids, Dys 3 solids, meds crushed. ST will continue to f/u for possible diet upgrade, continued education, and compensatory strategies. SNF could consider the free water protocol.  SLP Visit Diagnosis Dysphagia, pharyngeal phase (R13.13) Attention and concentration deficit following -- Frontal lobe and executive function deficit following -- Impact on safety and function Moderate aspiration risk   CHL IP TREATMENT RECOMMENDATION 02/25/2017 Treatment Recommendations Therapy as outlined in treatment plan below   Prognosis  02/25/2017 Prognosis for Safe Diet Advancement Good Barriers to Reach Goals -- Barriers/Prognosis Comment -- CHL IP DIET RECOMMENDATION 02/25/2017 SLP Diet Recommendations Dysphagia 3 (Mech soft) solids;Honey thick liquids Liquid Administration via Cup;No straw Medication Administration Crushed with puree Compensations Slow rate;Small sips/bites;Multiple dry swallows after each bite/sip Postural Changes Seated upright at 90 degrees   CHL IP OTHER RECOMMENDATIONS 02/25/2017 Recommended Consults -- Oral Care Recommendations Oral care BID Other Recommendations --   CHL IP FOLLOW UP RECOMMENDATIONS 02/25/2017 Follow up Recommendations Skilled Nursing facility   [facility] IP FREQUENCY AND DURATION 02/25/2017 Speech Therapy Frequency (ACUTE ONLY) min 2x/week Treatment Duration 2 weeks      CHL IP ORAL PHASE 02/25/2017 Oral Phase WFL Oral - Pudding Teaspoon -- Oral - Pudding Cup -- Oral - Honey Teaspoon NT Oral - Honey Cup WFL Oral - Nectar Teaspoon -- Oral - Nectar Cup Lingual/palatal residue Oral - Nectar Straw -- Oral - Thin Teaspoon -- Oral - Thin Cup NT Oral - Thin Straw -- Oral - Puree NT Oral - Mech Soft -- Oral - Regular WFL Oral - Multi-Consistency -- Oral - Pill -- Oral Phase - Comment --  CHL IP PHARYNGEAL PHASE 02/25/2017 Pharyngeal Phase Impaired Pharyngeal- Pudding Teaspoon -- Pharyngeal -- Pharyngeal- Pudding Cup -- Pharyngeal -- Pharyngeal- Honey Teaspoon NT Pharyngeal -- Pharyngeal- Honey Cup Delayed swallow initiation-pyriform sinuses;Delayed swallow initiation-vallecula;Penetration/Aspiration during swallow Pharyngeal Material enters airway, remains ABOVE vocal cords and not ejected out Pharyngeal- Nectar Teaspoon -- Pharyngeal -- Pharyngeal- Nectar Cup Penetration/Aspiration during swallow;Reduced airway/laryngeal closure;Reduced laryngeal elevation;Penetration/Aspiration before swallow;Reduced epiglottic inversion Pharyngeal Material enters airway, CONTACTS cords and not ejected out;Material enters airway, remains ABOVE  vocal cords and not ejected out;Material enters airway, remains ABOVE vocal cords then ejected out Pharyngeal- Nectar Straw -- Pharyngeal -- Pharyngeal- Thin Teaspoon -- Pharyngeal -- Pharyngeal- Thin Cup NT Pharyngeal -- Pharyngeal- Thin Straw -- Pharyngeal -- Pharyngeal- Puree NT Pharyngeal -- Pharyngeal- Mechanical Soft -- Pharyngeal -- Pharyngeal- Regular WFL Pharyngeal -- Pharyngeal- Multi-consistency -- Pharyngeal -- Pharyngeal- Pill -- Pharyngeal -- Pharyngeal Comment --  CHL IP CERVICAL ESOPHAGEAL PHASE 02/25/2017 Cervical Esophageal Phase WFL Pudding Teaspoon -- Pudding Cup -- Honey Teaspoon -- Honey Cup -- Nectar Teaspoon -- Nectar Cup -- Nectar Straw -- Thin Teaspoon -- Thin Cup -- Thin Straw -- Puree -- Mechanical Soft -- Regular -- Multi-consistency --  Pill -- Cervical Esophageal Comment -- No flowsheet data found. Houston Siren 02/25/2017, 11:06 AM Orbie Pyo Colvin Caroli.Ed CCC-SLP Pager [phone]               Assessment/Plan: S/P Procedure(s) (LRB): CORONARY ARTERY BYPASS GRAFTING (CABG) x 4                                                                               LIMA-LAD SEQ SVG-DIAG-OM SVG-PD (N/A) TRANSESOPHAGEAL ECHOCARDIOGRAM (TEE) (N/A) MITRAL VALVE REPLACEMENT (MVR) WITH MAGNA MITRAL EASE PERICARDIAL BIOPROSTHESIS 25 MM (N/A)  1 steady slow clinical progress 2 UO improved, creat improved, cont K+ replacement 3 Co-Ox 66 on same (.25) milrinone- AHF team managing 4 INR 2.9- no coumadin today 5 sinus with QTC> 500- will d/c IV amio and place on 200 po BID 6 push reha b as ab;e 7 pleural effus a bit larger, follow as may need thoracentesis eventually 8 D3 diet 9 ENT has seen for vocal cord paralysis - poss future procedure     LOS: 24 days    GOLD,WAYNE E 02/27/2017   Chart reviewed, patient examined, agree with above. Weight is down 5 lbs but I/O even. She says she is making a lot of urine so I suspect I/O not accurate. Continuing IV lasix and 2.5 mg Metolazone.  Her creat is slightly down to 2.41.   Her QTc is > 500 so will stop Diflucan. Keep an eye on her mouth. She may need to resume Nystatin. Continue amio at lower dose.  Her CXR yesterday shows small effusions and bilateral lower lobe atelectasis. This should improve with further diuresis. She does not need thoracentesis at this time.  I discussed her left vocal cord paralysis with Dr. Redmond Baseman and we decided to follow this for now since she is eating ok and breathing/walking ok. I don't think her vocal cord has anything to do with her exertional dyspnea. It is due to excess volume and atelectasis.

## 2017-02-27 NOTE — Progress Notes (Signed)
Patient still has bed available at [facility] after d/c once medically ready.  Rhea Pink, MSW,  [facility]

## 2017-02-27 NOTE — Progress Notes (Addendum)
Patient ID: [name] [name], female   DOB: 10-01-1935, 82 y.o.   MRN: [id number]     Advanced Heart Failure Rounding Note  PCP: Marden Noble, MD Primary Cardiologist: Dr. Yvone Neu [facility]) HF: Dr. Haroldine Laws   Subjective:    S/Pp emergent CABG x 4 and bioprosthetic MVR for LCX NSTEMI with flail MV on 2/15. Echo with EF 40% RV normal, and stable MVR.  ECHO 3/4 EF 20-25% RV mild-moderately reduced. Trivial pericardial effusion.   ENT consulted 3/5 due to hoarseness. Concern for L vocal cord paralysis.  Meropenum stopped 3/5. CO-OX dropped so milrinone was restarted on 3/5.    In/out of AF so back on IV amio .   Remains on milrinone 0.25 mcg. Coox 66.1% this am.  Feeling better this am.  States she had a lot of urine output yesterday.  She thinks that it all was recorded. Denies SOB.     CVP ordered but not connected or checked.  Have asked nurse to have set up.   ? Accuracy of I/Os.  + 40 cc but weight shows fown 5 lbs.   Creatinine stable to improved at 2.41.    Objective:   Weight Range: 176 lb 4.8 oz (80 kg) Body mass index is 28.46 kg/m.   Vital Signs:   Temp:  [97.4 F (36.3 C)-98 F (36.7 C)] 98 F (36.7 C) (03/09 0449) Pulse Rate:  [91-99] 93 (03/09 0449) Resp:  [18] 18 (03/09 0449) BP: (118-131)/(60-76) 125/71 (03/09 0449) SpO2:  [97 %-100 %] 99 % (03/09 0449) Weight:  [176 lb 4.8 oz (80 kg)] 176 lb 4.8 oz (80 kg) (03/09 0449) Last BM Date: 02/26/17  Weight change: Filed Weights   02/25/17 0600 02/26/17 0429 02/27/17 0449  Weight: 181 lb 10.5 oz (82.4 kg) 181 lb 1.6 oz (82.1 kg) 176 lb 4.8 oz (80 kg)    Intake/Output:   Intake/Output Summary (Last 24 hours) at 02/27/17 0741 Last data filed at 02/27/17 0437  Gross per 24 hour  Intake           840.58 ml  Output              800 ml  Net            40.58 ml     Physical Exam: General:  Elderly and fatigued. Hoarse voice.  NAD.     HEENT: Normal Neck: Supple, JVP 11-12 cm+. 2+ bilat; no bruits. No  thyromegaly or nodule noted.  Cor: PMI nondisplaced. Irregular. No M/G/R noted.  Sternal incision approximated   Lungs: Slightly decreased basilar sounds.  Abdomen: soft, NT, ND, no HSM. No bruits or masses. +BS  Extremities: no cyanosis, clubbing, rash. RUE PICC.  2+ BLE edema.   Neuro: A&Ox3 follows commands and is oriented. Moves all 4  Telemetry: Personally reviewed, NSR.   Labs: CBC  Recent Labs  02/26/17 0426  WBC 12.8*  HGB 9.2*  HCT 30.0*  MCV 79.4  PLT 616   Basic Metabolic Panel  Recent Labs  02/25/17 1030 02/26/17 0426 02/27/17 0436  NA  --  143 145  K  --  3.8 3.5  CL  --  105 104  CO2  --  29 33*  GLUCOSE  --  128* 134*  BUN  --  45* 43*  CREATININE  --  2.58* 2.41*  CALCIUM  --  8.8* 8.8*  MG 1.8  --   --    Liver Function Tests  Recent Labs  02/26/17 0426 02/27/17 0436  AST 38 38  ALT 30 27  ALKPHOS 100 102  BILITOT 2.3* 2.4*  PROT 4.9* 5.0*  ALBUMIN 2.4* 2.4*   No results for input(s): LIPASE, AMYLASE in the last 72 hours. Cardiac Enzymes No results for input(s): CKTOTAL, CKMB, CKMBINDEX, TROPONINI in the last 72 hours.  BNP: BNP (last 3 results) No results for input(s): BNP in the last 8760 hours.  ProBNP (last 3 results) No results for input(s): PROBNP in the last 8760 hours.   D-Dimer No results for input(s): DDIMER in the last 72 hours. Hemoglobin A1C No results for input(s): HGBA1C in the last 72 hours. Fasting Lipid Panel No results for input(s): CHOL, HDL, LDLCALC, TRIG, CHOLHDL, LDLDIRECT in the last 72 hours. Thyroid Function Tests No results for input(s): TSH, T4TOTAL, T3FREE, THYROIDAB in the last 72 hours.  Invalid input(s): FREET3  Other results:     Imaging/Studies:  Dg Chest 2 View  Result Date: 02/26/2017 CLINICAL DATA:  Shortness of breath today. Patient status post CABG 02/06/2017. EXAM: CHEST  2 VIEW COMPARISON:  PA and lateral chest 02/21/2017. FINDINGS: Right PICC remains in place. 7 intact median  sternotomy wires are unchanged. Mitral valve replacement is noted. Small bilateral pleural effusions and basilar atelectasis, both worse on the left, are mildly increased since the comparison examination. No pneumothorax. No pulmonary edema. IMPRESSION: Some increase in small bilateral pleural effusions and basilar airspace disease, worse on the left. Electronically Signed   By: Inge Rise M.D.   On: 02/26/2017 09:53      Medications:     Scheduled Medications: . amiodarone  200 mg Oral BID  . fluconazole  50 mg Oral Daily  . furosemide  80 mg Intravenous BID  . insulin aspart  0-15 Units Subcutaneous TID WC  . mouth rinse  15 mL Mouth Rinse q12n4p  . metolazone  2.5 mg Oral Once  . moving right along book   Does not apply Once  . pantoprazole sodium  40 mg Oral Daily  . potassium chloride  40 mEq Oral TID  . Warfarin - Physician Dosing Inpatient   Does not apply q1800    Infusions: . milrinone 0.25 mcg/kg/min (02/27/17 0714)    PRN Medications: diphenhydrAMINE, hydrALAZINE, ondansetron (ZOFRAN) IV, RESOURCE THICKENUP CLEAR, sodium chloride flush, traMADol   Assessment/Plan   1. CAD [name] on 2/15 2. Severe MR with flail segment s/p MVR c/ bioprosthetic valve. 3. Cardiogenic shock: Echo pre-op with EF 40-45%, lateral akinesis, severe MR.     --Echo 2/22 EF 40% MVR ok 4. AKI 5. Shock Liver 6. Acute respiratory failure with hypoxemia requiring intubation 7. Thrombocytopenia: Post-op. 8. Tracheal aspirate with Strep pneumoniae 9. PAF 10. Leukocytosis 11. Hypokalemia 12. L vocal cord paralysis  Co-ox 66% on milrinone 0.25 mcg. Remains markedly volume overloaded with 2+ BLE edema.     Continue lasix 80 mg IV BID and agree with repeating 2.5 mg metolazone. Awaiting CVP.  May need to increase lasix to TID depending on output this am.  ? Accuracy of I/Os.   Continue K supp 40 meq TID. Creatinine stable. Follow closely.  Will give extra this am with metolazone.    In and out of A fib. Continue amio drip while on milrinone. LFTs WNL. No change.   On coumadin. INR per Dr Cyndia Bent.   Continue statin. No BB for now on milrinone.   Shirley Friar, PA-C  7:41 AM  Advanced Heart Failure Team Pager  [phone] (M-F; 7a - 4p)  Please contact [facility] for night-coverage after hours (4p -7a ) and weekends on [email or web address]  Patient seen with PA, agree with the above note. She is still volume overloaded by exam. Good co-ox.  Cannot measure CVP on this floor.  I/Os not accurate (nurses having trouble measuring).  Weight, however, is coming down.  Creatinine stable.  Will continue IV Lasix today and will give a dose of metolazone.  Continue milrinone while diuresing.   She remains in NSR, continue amiodarone.    Loralie Champagne 02/27/2017

## 2017-02-27 NOTE — Progress Notes (Signed)
  Speech Language Pathology Treatment: Dysphagia  Patient Details Name: [name] [name] MRN: [id number] DOB: Oct 14, 1935 Today's Date: 02/27/2017 Time: 1975-8832 SLP Time Calculation (min) (ACUTE ONLY): 18 min  Assessment / Plan / Recommendation Clinical Impression  Pt continues with impaired swallow as confirmed by repeat MBS 3/7.  Today, granddaughter reports improved overall alertness, MS.  We reviewed deficits causing swallowing difficulty and introduced exercise to focus on submental strength for laryngeal elevation (chin tuck against resistance).  Pt able to carry out exercise with only intermittent verbal cues for success.  SLP will continue to follow.   HPI HPI: [name] [name] is a 82 year old woman with history of type 2 diabetes mellitus and hypertension, who presented to cardiology clinic due to acute chest pain that began 3 days ago while doing light housework. EKG showed abnormalities concerning for ischemia. Urgent cardiac cath revealed three-vessel CAD, including serial 70 and 80% proximal/mid LAD stenoses. Underwent CABG x 4 2/15 and extubated 2/19. CXR No pneumothorax or pulmonary edema. Left lung base atelectasis and possible small pleural effusion. Previous MBS on 02/13/2017-recommended Dys 1, honey thick. Evaluated by ENT on 3/2, dx left vocal cord paralysis in the paramedian position, resulting in a glottic gap. FEES on 02/24/2017 for potential liberalization of diet, however could not visualize laryngeal/pharyngeal area leading to order for repeat MBS.       SLP Plan  Continue with current plan of care       Recommendations  Diet recommendations: Dysphagia 3 (mechanical soft);Honey-thick liquid Liquids provided via: Cup Medication Administration: Crushed with puree Supervision: Patient able to self feed;Intermittent supervision to cue for compensatory strategies Compensations: Slow rate;Small sips/bites;Multiple dry swallows after each bite/sip Postural Changes and/or Swallow  Maneuvers: Seated upright 90 degrees                Oral Care Recommendations: Oral care BID Follow up Recommendations: Skilled Nursing facility SLP Visit Diagnosis: Dysphagia, pharyngeal phase (R13.13) Plan: Continue with current plan of care       GO                [name] [name] 02/27/2017, 10:32 AM

## 2017-02-27 NOTE — Progress Notes (Signed)
Patient refused to ambulated with RN , she said she already walked twice today, patient resting quietly in her recliner, call bell within reach, will continue to monitor.

## 2017-02-27 NOTE — Care Management Important Message (Signed)
Important Message  Patient Details  Name: [name] [name] MRN: [id number] Date of Birth: 02/21/1935   Medicare Important Message Given:  Yes    Lilyth Lawyer Abena 02/27/2017, 1:56 PM

## 2017-02-28 DIAGNOSIS — I5021 Acute systolic (congestive) heart failure: Secondary | ICD-10-CM

## 2017-02-28 LAB — GLUCOSE, CAPILLARY
GLUCOSE-CAPILLARY: 202 mg/dL — AB (ref 65–99)
Glucose-Capillary: 131 mg/dL — ABNORMAL HIGH (ref 65–99)
Glucose-Capillary: 182 mg/dL — ABNORMAL HIGH (ref 65–99)
Glucose-Capillary: 135 mg/dL — ABNORMAL HIGH (ref 65–99)

## 2017-02-28 LAB — COMPREHENSIVE METABOLIC PANEL
ALBUMIN: 2.3 g/dL — AB (ref 3.5–5.0)
ALK PHOS: 97 U/L (ref 38–126)
ALT: 26 U/L (ref 14–54)
AST: 37 U/L (ref 15–41)
Anion gap: 9 (ref 5–15)
BILIRUBIN TOTAL: 2.7 mg/dL — AB (ref 0.3–1.2)
BUN: 41 mg/dL — AB (ref 6–20)
CALCIUM: 8.6 mg/dL — AB (ref 8.9–10.3)
CO2: 34 mmol/L — AB (ref 22–32)
CREATININE: 2.24 mg/dL — AB (ref 0.44–1.00)
Chloride: 98 mmol/L — ABNORMAL LOW (ref 101–111)
GFR calc Af Amer: 23 mL/min — ABNORMAL LOW (ref >60)
GFR calc non Af Amer: 20 mL/min — ABNORMAL LOW (ref >60)
GLUCOSE: 137 mg/dL — AB (ref 65–99)
Potassium: 3.4 mmol/L — ABNORMAL LOW (ref 3.5–5.1)
SODIUM: 141 mmol/L (ref 135–145)
Total Protein: 4.8 g/dL — ABNORMAL LOW (ref 6.5–8.1)

## 2017-02-28 LAB — PROTIME-INR
INR: 2.57
Prothrombin Time: 28.1 seconds — ABNORMAL HIGH (ref 11.4–15.2)

## 2017-02-28 LAB — COOXEMETRY PANEL
Carboxyhemoglobin: 1.5 % (ref 0.5–1.5)
Methemoglobin: 1.1 % (ref 0.0–1.5)
O2 SAT: 60.8 %
Total hemoglobin: 8.7 g/dL — ABNORMAL LOW (ref 12.0–16.0)

## 2017-02-28 MED ORDER — GLUCERNA SHAKE PO LIQD: 237.0000 mL | Freq: Three times a day (TID) | ORAL | Status: DC

## 2017-02-28 MED ORDER — GLUCERNA SHAKE PO LIQD
237.0000 mL | Freq: Three times a day (TID) | ORAL | Status: DC
  Administered 2017-02-28 – 2017-03-04 (×5): 237 mL via ORAL

## 2017-02-28 NOTE — Progress Notes (Signed)
CARDIAC REHAB PHASE I   PRE:  Rate/Rhythm: 97 SR PACs and PVCs  BP:  Supine:   Sitting: 125/69  Standing:    SaO2: 99% 2L  MODE:  Ambulation: 150 ft   POST:  Rate/Rhythm: 108-113 ST PACs  BP:  Supine:   Sitting: 148/75  Standing:    SaO2: 93% 2L 1036-1100 Pt to BSC and then walked 150 ft on 2L with rolling walker and asst x 1. Gait steady. Pt did not want to walk farther. Less SOB with walk than with previous walks. To recliner with call bell. Encouraged iS and flutter valve and more walks today.   Graylon Good, RN BSN  02/28/2017 10:56 AM

## 2017-02-28 NOTE — Progress Notes (Signed)
Patient refused ambulating in the hall, resting quietly in her recliner will continue to monitor

## 2017-02-28 NOTE — Progress Notes (Addendum)
[address]       [place] [address]             [phone]      23 Days Post-Op Procedure(s) (LRB): CORONARY ARTERY BYPASS GRAFTING (CABG) x 4                                                                               LIMA-LAD SEQ SVG-DIAG-OM SVG-PD (N/A) TRANSESOPHAGEAL ECHOCARDIOGRAM (TEE) (N/A) MITRAL VALVE REPLACEMENT (MVR) WITH MAGNA MITRAL EASE PERICARDIAL BIOPROSTHESIS 25 MM (N/A)   Subjective:  [name] [name] states she is doing okay.  No specific complaints.  + ambulation with assistance  + BM  Objective: Vital signs in last 24 hours: Temp:  [97.9 F (36.6 C)-98.6 F (37 C)] 98.2 F (36.8 C) (03/10 0927) Pulse Rate:  [92-104] 101 (03/10 0927) Cardiac Rhythm: Normal sinus rhythm (03/10 0701) Resp:  [20] 20 (03/10 0927) BP: (108-128)/(58-67) 108/66 (03/10 0927) SpO2:  [96 %-99 %] 98 % (03/10 0927) Weight:  [172 lb 14.4 oz (78.4 kg)] 172 lb 14.4 oz (78.4 kg) (03/10 0610)  Hemodynamic parameters for last 24 hours: CVP:  [11 mmHg] 11 mmHg  Intake/Output from previous day: 03/09 0701 - 03/10 0700 In: 1146.9 [P.O.:720; I.V.:426.9] Out: 3600 [Urine:3600] Intake/Output this shift: No intake/output data recorded.  General appearance: alert, cooperative and no distress Heart: regular rate and rhythm Lungs: diminished breath sounds bibasilar Abdomen: soft, non-tender; bowel sounds normal; no masses,  no organomegaly Extremities: edema 2+ pitting Wound: clean and dry  Lab Results:  Recent Labs  02/26/17 0426  WBC 12.8*  HGB 9.2*  HCT 30.0*  PLT 279   BMET:  Recent Labs  02/27/17 0436 02/28/17 0454  NA 145 141  K 3.5 3.4*  CL 104 98*  CO2 33* 34*  GLUCOSE 134* 137*  BUN 43* 41*  CREATININE 2.41* 2.24*  CALCIUM 8.8* 8.6*    PT/INR:  Recent Labs  02/28/17 0454  LABPROT 28.1*  INR 2.57   ABG    Component Value Date/Time   PHART 7.377 02/09/2017 1603   HCO3 23.0 02/09/2017 1603   TCO2 26 02/11/2017 1618   ACIDBASEDEF 2.0 02/09/2017 1603   O2SAT 60.8 02/28/2017 0505   CBG (last 3)   Recent Labs  02/27/17 1701 02/27/17 2106 02/28/17 0637  GLUCAP 173* 102* 131*    Assessment/Plan: S/P Procedure(s) (LRB): CORONARY ARTERY BYPASS GRAFTING (CABG) x 4                                                                               LIMA-LAD SEQ SVG-DIAG-OM SVG-PD (N/A) TRANSESOPHAGEAL ECHOCARDIOGRAM (TEE) (N/A) MITRAL VALVE REPLACEMENT (MVR) WITH MAGNA MITRAL EASE PERICARDIAL BIOPROSTHESIS 25 MM (N/A)  1. CV- NSR, Co-ox 60.8 this AM- remains on Milrinone drip per AHF... Qtc is down to 519 continue reduced dose of Amiodarone 2. INR- down to 2.57,  continue to hold coumadin 3. Pulm- wean oxygen as tolerated, small bilateral effusions per CXR.. Will plan to repeat CXR in a few days, continue IS 4. Renal- creatinine improving at 2.24, remains hypervolemic with pitting LE edema, continue IV lasix and repeat Zaroxolyn ordered per AHF for this morning, K at 3.4 continue supplementation 5. Dysphagia/Vocal chord paralysis- continue dysphagia diet, ENT aware of paralysis patient may need procedure in the future 6. DM- sugars controlled 7. Deconditioning-SNF once appropriate for discharge 8. Dispo- continues to make slow progress, good response to diuretics, Milrinone per AHF, continue current care    LOS: 25 days    BARRETT, ERIN 02/28/2017  I have seen and examined the patient and agree with the assessment and plan as outlined.  Rexene Alberts, MD 02/28/2017 10:57 AM

## 2017-02-28 NOTE — Progress Notes (Signed)
Patient ID: [name] [name], female   DOB: 09-May-1935, 82 y.o.   MRN: [id number]     Advanced Heart Failure Rounding Note  PCP: Marden Noble, MD Primary Cardiologist: Dr. Yvone Neu [facility]) HF: Dr. Haroldine Laws   Subjective:    s/p emergent CABG x 4 and bioprosthetic MVR for LCX NSTEMI with flail MV on 2/15.  Post-op Echo with EF 40% RV normal, and stable MVR.  ECHO 3/4 EF 20-25% RV mild-moderately reduced. Trivial pericardial effusion.   ENT consulted 3/5 due to hoarseness. + L vocal cord paralysis.   CO-OX dropped so milrinone was restarted on 3/5.    Remains on milrinone 0.25 mcg. Coox 61% this am. Diuresing well on lasix 80 iV bid and metolazone. Weight down another 4 pounds. Renal function improving.  Denies SOB.  Ambulating the hall several times per day. Voice still hoarse.    Objective:   Weight Range: 78.4 kg (172 lb 14.4 oz) Body mass index is 27.91 kg/m.   Vital Signs:   Temp:  [98 F (36.7 C)-98.6 F (37 C)] 98 F (36.7 C) (03/10 1251) Pulse Rate:  [92-104] 92 (03/10 1251) Resp:  [20-21] 21 (03/10 1251) BP: (108-128)/(58-67) 112/62 (03/10 1251) SpO2:  [96 %-100 %] 100 % (03/10 1251) Weight:  [78.4 kg (172 lb 14.4 oz)] 78.4 kg (172 lb 14.4 oz) (03/10 0610) Last BM Date: 02/27/17  Weight change: Filed Weights   02/26/17 0429 02/27/17 0449 02/28/17 0610  Weight: 82.1 kg (181 lb 1.6 oz) 80 kg (176 lb 4.8 oz) 78.4 kg (172 lb 14.4 oz)    Intake/Output:   Intake/Output Summary (Last 24 hours) at 02/28/17 1420 Last data filed at 02/28/17 1332  Gross per 24 hour  Intake          1386.92 ml  Output             3300 ml  Net         -1913.08 ml     Physical Exam: General:  Sitting in chair. Hoarse voice.  NAD.     HEENT: Normal Neck: Supple, JVP to jaw. 2+ bilat; no bruits. No thyromegaly or nodule noted.  Cor: PMI nondisplaced. Irregular. No M/G/R noted.  Sternal incision approximated   Lungs: Slightly decreased basilar sounds.  Abdomen: soft, NT, ND, no  HSM. No bruits or masses. +BS  Extremities: no cyanosis, clubbing, rash. RUE PICC.  2-3+ BLE edema.  + TED hose Neuro: A&Ox3 follows commands and is oriented. Moves all 4  Telemetry: Personally reviewed, NSR. Occasional PVCs  Labs: CBC  Recent Labs  02/26/17 0426  WBC 12.8*  HGB 9.2*  HCT 30.0*  MCV 79.4  PLT 097   Basic Metabolic Panel  Recent Labs  02/27/17 0436 02/28/17 0454  NA 145 141  K 3.5 3.4*  CL 104 98*  CO2 33* 34*  GLUCOSE 134* 137*  BUN 43* 41*  CREATININE 2.41* 2.24*  CALCIUM 8.8* 8.6*   Liver Function Tests  Recent Labs  02/27/17 0436 02/28/17 0454  AST 38 37  ALT 27 26  ALKPHOS 102 97  BILITOT 2.4* 2.7*  PROT 5.0* 4.8*  ALBUMIN 2.4* 2.3*   No results for input(s): LIPASE, AMYLASE in the last 72 hours. Cardiac Enzymes No results for input(s): CKTOTAL, CKMB, CKMBINDEX, TROPONINI in the last 72 hours.  BNP: BNP (last 3 results) No results for input(s): BNP in the last 8760 hours.  ProBNP (last 3 results) No results for input(s): PROBNP in the last 8760  hours.   D-Dimer No results for input(s): DDIMER in the last 72 hours. Hemoglobin A1C No results for input(s): HGBA1C in the last 72 hours. Fasting Lipid Panel No results for input(s): CHOL, HDL, LDLCALC, TRIG, CHOLHDL, LDLDIRECT in the last 72 hours. Thyroid Function Tests No results for input(s): TSH, T4TOTAL, T3FREE, THYROIDAB in the last 72 hours.  Invalid input(s): FREET3  Other results:     Imaging/Studies:  No results found.    Medications:     Scheduled Medications: . amiodarone  200 mg Oral BID  . atorvastatin  80 mg Oral q1800  . feeding supplement (GLUCERNA SHAKE)  237 mL Oral TID BM  . furosemide  80 mg Intravenous BID  . insulin aspart  0-15 Units Subcutaneous TID WC  . mouth rinse  15 mL Mouth Rinse q12n4p  . moving right along book   Does not apply Once  . pantoprazole sodium  40 mg Oral Daily  . potassium chloride  40 mEq Oral TID  . Warfarin -  Physician Dosing Inpatient   Does not apply q1800    Infusions: . milrinone 0.25 mcg/kg/min (02/28/17 1155)    PRN Medications: diphenhydrAMINE, hydrALAZINE, ondansetron (ZOFRAN) IV, RESOURCE THICKENUP CLEAR, sodium chloride flush, traMADol   Assessment/Plan   1. CAD [name] on 2/15 2. Severe MR with flail segment s/p MVR c/ bioprosthetic valve. 3. Cardiogenic shock: Echo pre-op with EF 40-45%, lateral akinesis, severe MR.     --Echo 2/22 EF 40% MVR ok. Echo 3/5 EF 20-25% 4. AKI 5. Shock Liver 6. Acute respiratory failure with hypoxemia requiring intubation 7. Thrombocytopenia: Post-op. 8. Tracheal aspirate with Strep pneumoniae 9. PAF 10. Leukocytosis 11. Hypokalemia 12. L vocal cord paralysis  Co-ox 61% on milrinone 0.25 mcg. Remains markedly volume overloaded with 2-3+ BLE edema.     It seems like her kidneys are finally waking up and post-op ATN is resolving. She is diuresing and creatinine now decreasing. She is still 14 pounds up from baseline however. Would continue milrinone. IV lasix and metolazone. Supp K.   Will continue amio drip while on milrinone. INR 2.6  Continue statin. No BB for now on milrinone. Off ASA with coumadin.   Continue to ambulate.   Glori Bickers, MD  2:20 PM Advanced Heart Failure Team Pager [phone] (M-F; 7a - 4p)  Please contact [facility] for night-coverage after hours (4p -7a ) and weekends on [email or web address]

## 2017-03-01 LAB — PROTIME-INR
INR: 2.39
Prothrombin Time: 26.5 seconds — ABNORMAL HIGH (ref 11.4–15.2)

## 2017-03-01 LAB — COMPREHENSIVE METABOLIC PANEL
ALBUMIN: 2.2 g/dL — AB (ref 3.5–5.0)
ALK PHOS: 98 U/L (ref 38–126)
ALT: 26 U/L (ref 14–54)
ANION GAP: 8 (ref 5–15)
AST: 35 U/L (ref 15–41)
BUN: 40 mg/dL — ABNORMAL HIGH (ref 6–20)
CO2: 36 mmol/L — AB (ref 22–32)
Calcium: 8.6 mg/dL — ABNORMAL LOW (ref 8.9–10.3)
Chloride: 98 mmol/L — ABNORMAL LOW (ref 101–111)
Creatinine, Ser: 2.26 mg/dL — ABNORMAL HIGH (ref 0.44–1.00)
GFR calc Af Amer: 23 mL/min — ABNORMAL LOW (ref >60)
GFR calc non Af Amer: 19 mL/min — ABNORMAL LOW (ref >60)
GLUCOSE: 156 mg/dL — AB (ref 65–99)
POTASSIUM: 3.5 mmol/L (ref 3.5–5.1)
SODIUM: 142 mmol/L (ref 135–145)
Total Bilirubin: 2.7 mg/dL — ABNORMAL HIGH (ref 0.3–1.2)
Total Protein: 4.6 g/dL — ABNORMAL LOW (ref 6.5–8.1)

## 2017-03-01 LAB — GLUCOSE, CAPILLARY
GLUCOSE-CAPILLARY: 157 mg/dL — AB (ref 65–99)
GLUCOSE-CAPILLARY: 173 mg/dL — AB (ref 65–99)
Glucose-Capillary: 160 mg/dL — ABNORMAL HIGH (ref 65–99)
Glucose-Capillary: 174 mg/dL — ABNORMAL HIGH (ref 65–99)

## 2017-03-01 LAB — COOXEMETRY PANEL
CARBOXYHEMOGLOBIN: 1.5 % (ref 0.5–1.5)
Methemoglobin: 0.9 % (ref 0.0–1.5)
O2 SAT: 69.5 %
Total hemoglobin: 8.5 g/dL — ABNORMAL LOW (ref 12.0–16.0)

## 2017-03-01 MED ORDER — METOLAZONE 2.5 MG PO TABS
2.5000 mg | ORAL_TABLET | Freq: Once | ORAL | Status: DC
  Filled 2017-03-01: qty 1

## 2017-03-01 MED ORDER — METOLAZONE 2.5 MG PO TABS
2.5000 mg | ORAL_TABLET | Freq: Once | ORAL | Status: AC
  Administered 2017-03-01: 2.5 mg via ORAL
  Filled 2017-03-01: qty 1

## 2017-03-01 NOTE — Progress Notes (Signed)
Patient ID: [name] [name], female   DOB: 05-24-1935, 82 y.o.   MRN: [id number]     Advanced Heart Failure Rounding Note  PCP: Marden Noble, MD Primary Cardiologist: Dr. Yvone Neu [facility]) HF: Dr. Haroldine Laws   Subjective:    s/p emergent CABG x 4 and bioprosthetic MVR for LCX NSTEMI with flail MV on 2/15.  Post-op Echo with EF 40% RV normal, and stable MVR.  ECHO 3/4 EF 20-25% RV mild-moderately reduced. Trivial pericardial effusion.   ENT consulted 3/5 due to hoarseness. + L vocal cord paralysis.   CO-OX dropped so milrinone was restarted on 3/5.    Remains on milrinone 0.25 mcg. Coox 69% this am. Modest diuresis on lasix 80 iV bid. Did not get metolazone yesterday. Weight down about 2 more pounds. Creatinine stable.  Denies SOB.  Ambulating the hall several times per day. Voice still hoarse. On dysphagia diet   Objective:   Weight Range: 77.6 kg (171 lb 1.6 oz) Body mass index is 27.62 kg/m.   Vital Signs:   Temp:  [98 F (36.7 C)-98.8 F (37.1 C)] 98.5 F (36.9 C) (03/11 1027) Pulse Rate:  [92-96] 96 (03/11 1027) Resp:  [18-21] 18 (03/11 1027) BP: (112-119)/(62-71) 119/69 (03/11 1027) SpO2:  [97 %-100 %] 97 % (03/11 1027) Weight:  [77.6 kg (171 lb 1.6 oz)] 77.6 kg (171 lb 1.6 oz) (03/11 0521) Last BM Date: 02/28/17  Weight change: Filed Weights   02/27/17 0449 02/28/17 0610 03/01/17 0521  Weight: 80 kg (176 lb 4.8 oz) 78.4 kg (172 lb 14.4 oz) 77.6 kg (171 lb 1.6 oz)    Intake/Output:   Intake/Output Summary (Last 24 hours) at 03/01/17 1106 Last data filed at 03/01/17 0530  Gross per 24 hour  Intake              240 ml  Output             2250 ml  Net            -2010 ml     Physical Exam: General:  Sitting in chair. Hoarse voice.  NAD.     HEENT: Normal Neck: Supple, JVP 9-10. 2+ bilat; no bruits. No thyromegaly or nodule noted.  Cor: PMI nondisplaced. Irregular. No M/G/R noted.  Lungs: clear Abdomen: soft, NT, ND, no HSM. No bruits or masses. +BS    Extremities: no cyanosis, clubbing, rash. RUE PICC.  2+ BLE edema.  + TED hose Neuro: A&Ox3 follows commands and is oriented. Moves all 4  Telemetry: Personally reviewed, NSR. Occasional PVCs  Labs: CBC No results for input(s): WBC, NEUTROABS, HGB, HCT, MCV, PLT in the last 72 hours. Basic Metabolic Panel  Recent Labs  02/28/17 0454 03/01/17 0447  NA 141 142  K 3.4* 3.5  CL 98* 98*  CO2 34* 36*  GLUCOSE 137* 156*  BUN 41* 40*  CREATININE 2.24* 2.26*  CALCIUM 8.6* 8.6*   Liver Function Tests  Recent Labs  02/28/17 0454 03/01/17 0447  AST 37 35  ALT 26 26  ALKPHOS 97 98  BILITOT 2.7* 2.7*  PROT 4.8* 4.6*  ALBUMIN 2.3* 2.2*   No results for input(s): LIPASE, AMYLASE in the last 72 hours. Cardiac Enzymes No results for input(s): CKTOTAL, CKMB, CKMBINDEX, TROPONINI in the last 72 hours.  BNP: BNP (last 3 results) No results for input(s): BNP in the last 8760 hours.  ProBNP (last 3 results) No results for input(s): PROBNP in the last 8760 hours.   D-Dimer No  results for input(s): DDIMER in the last 72 hours. Hemoglobin A1C No results for input(s): HGBA1C in the last 72 hours. Fasting Lipid Panel No results for input(s): CHOL, HDL, LDLCALC, TRIG, CHOLHDL, LDLDIRECT in the last 72 hours. Thyroid Function Tests No results for input(s): TSH, T4TOTAL, T3FREE, THYROIDAB in the last 72 hours.  Invalid input(s): FREET3  Other results:     Imaging/Studies:  No results found.    Medications:     Scheduled Medications: . amiodarone  200 mg Oral BID  . atorvastatin  80 mg Oral q1800  . feeding supplement (GLUCERNA SHAKE)  237 mL Oral TID BM  . furosemide  80 mg Intravenous BID  . insulin aspart  0-15 Units Subcutaneous TID WC  . mouth rinse  15 mL Mouth Rinse q12n4p  . moving right along book   Does not apply Once  . pantoprazole sodium  40 mg Oral Daily  . potassium chloride  40 mEq Oral TID  . Warfarin - Physician Dosing Inpatient   Does not apply  q1800    Infusions: . milrinone 0.25 mcg/kg/min (03/01/17 0114)    PRN Medications: diphenhydrAMINE, hydrALAZINE, ondansetron (ZOFRAN) IV, RESOURCE THICKENUP CLEAR, sodium chloride flush, traMADol   Assessment/Plan   1. NSTEMI with CAD [name] on 2/15 2. Severe MR with flail segment s/p MVR c/ bioprosthetic valve. 3. Cardiogenic shock: Echo pre-op with EF 40-45%, lateral akinesis, severe MR.     --Echo 2/22 EF 40% MVR ok. Echo 3/5 EF 20-25% 4. AKI 5. Shock Liver 6. PAF 7. Hypokalemia 8. L vocal cord paralysis  Co-ox 69% on milrinone 0.25 mcg. Remains volume overloaded with 2+ BLE edema.  She is diuresing and creatinine now somewhat improved. She is still 12 pounds up from baseline however. Will continue milrinone at 0.25 and start wean tomorrow. Continue IV lasix. One does metolazone today. Supp K. Need to be careful not to push kidneys too hard as renal function has been very labile post-op.  On po amio. Maintaining NSR. INR 2.4  Continue statin. No BB for now on milrinone. Off ASA with coumadin.   Continue to ambulate. Hopefully home midweek   Glori Bickers, MD  11:06 AM Advanced Heart Failure Team Pager [phone] (M-F; 7a - 4p)  Please contact [facility] for night-coverage after hours (4p -7a ) and weekends on [email or web address]

## 2017-03-01 NOTE — Progress Notes (Signed)
Patient ambulated 132ft on 2L O2 using a rolling walker, ambulation well tolerated, patient now resting quietly in her bed, call bell within reach, will continue to monitor

## 2017-03-01 NOTE — Progress Notes (Addendum)
[address]       [place] [address]             [phone]      24 Days Post-Op Procedure(s) (LRB): CORONARY ARTERY BYPASS GRAFTING (CABG) x 4                                                                               LIMA-LAD SEQ SVG-DIAG-OM SVG-PD (N/A) TRANSESOPHAGEAL ECHOCARDIOGRAM (TEE) (N/A) MITRAL VALVE REPLACEMENT (MVR) WITH MAGNA MITRAL EASE PERICARDIAL BIOPROSTHESIS 25 MM (N/A)   Subjective:  [name] [name] has no new complaints.  States she is doing okay.  Denies problems with thrush in her mouth.  + ambulation   + BM  Objective: Vital signs in last 24 hours: Temp:  [98 F (36.7 C)-98.8 F (37.1 C)] 98.8 F (37.1 C) (03/11 0521) Pulse Rate:  [92-101] 96 (03/11 0521) Cardiac Rhythm: Normal sinus rhythm (03/11 0701) Resp:  [20-21] 20 (03/11 0521) BP: (108-118)/(62-71) 114/65 (03/11 0521) SpO2:  [98 %-100 %] 98 % (03/11 0521) Weight:  [171 lb 1.6 oz (77.6 kg)] 171 lb 1.6 oz (77.6 kg) (03/11 0521)  Intake/Output from previous day: 03/10 0701 - 03/11 0700 In: 480 [P.O.:480] Out: 2250 [Urine:2250]  General appearance: alert, cooperative and no distress Heart: regular rate and rhythm Lungs: diminished breath sounds bibasilar Abdomen: soft, non-tender; bowel sounds normal; no masses,  no organomegaly Extremities: edema 2+ pitting Wound: clean and dry  Lab Results: No results for input(s): WBC, HGB, HCT, PLT in the last 72 hours. BMET:  Recent Labs  02/28/17 0454 03/01/17 0447  NA 141 142  K 3.4* 3.5  CL 98* 98*  CO2 34* 36*  GLUCOSE 137* 156*  BUN 41* 40*  CREATININE 2.24* 2.26*  CALCIUM 8.6* 8.6*    PT/INR:  Recent Labs  03/01/17 0447  LABPROT 26.5*  INR 2.39   ABG    Component Value Date/Time   PHART 7.377 02/09/2017 1603   HCO3 23.0 02/09/2017 1603   TCO2 26 02/11/2017 1618   ACIDBASEDEF 2.0 02/09/2017 1603   O2SAT 69.5 03/01/2017 0500   CBG (last 3)   Recent Labs  02/28/17 1637 02/28/17 2111  03/01/17 0550  GLUCAP 135* 202* 160*    Assessment/Plan: S/P Procedure(s) (LRB): CORONARY ARTERY BYPASS GRAFTING (CABG) x 4                                                                               LIMA-LAD SEQ SVG-DIAG-OM SVG-PD (N/A) TRANSESOPHAGEAL ECHOCARDIOGRAM (TEE) (N/A) MITRAL VALVE REPLACEMENT (MVR) WITH MAGNA MITRAL EASE PERICARDIAL BIOPROSTHESIS 25 MM (N/A)  1. CV- NSR, Co-ox 69.5, on Milrinone per AHF, Qtc at 513 this morning continue reduced Amiodarone 2. INR down to 2.39, will again hold coumadin, can likely restart tomorrow at 0.5 mg daily 3. Pulm- continue to wean oxygen as tolerated, continue IS... Repeat CXR in  AM 4. Renal- creatinine stable today at 2.26, good response to diuretics, continue IV Lasix and Metalozone 5. Dysphagia/Vocal chord paralysis- continue dysphagia diet, voice remains hoarse 6. DM- sugars controlled 7. Dispo-continues to make slow progress, continue diuretics, milrinone per HF, continue current care   LOS: 26 days    [name] [name] 03/01/2017  I have seen and examined the patient and agree with the assessment and plan as outlined.  Rexene Alberts, MD 03/01/2017 10:57 AM

## 2017-03-02 ENCOUNTER — Inpatient Hospital Stay (HOSPITAL_COMMUNITY): Payer: Medicare Other

## 2017-03-02 LAB — COMPREHENSIVE METABOLIC PANEL
ALT: 26 U/L (ref 14–54)
ANION GAP: 7 (ref 5–15)
AST: 37 U/L (ref 15–41)
Albumin: 2.4 g/dL — ABNORMAL LOW (ref 3.5–5.0)
Alkaline Phosphatase: 94 U/L (ref 38–126)
BUN: 36 mg/dL — ABNORMAL HIGH (ref 6–20)
CALCIUM: 8.6 mg/dL — AB (ref 8.9–10.3)
CHLORIDE: 96 mmol/L — AB (ref 101–111)
CO2: 38 mmol/L — ABNORMAL HIGH (ref 22–32)
CREATININE: 2.27 mg/dL — AB (ref 0.44–1.00)
GFR, EST AFRICAN AMERICAN: 22 mL/min — AB (ref >60)
GFR, EST NON AFRICAN AMERICAN: 19 mL/min — AB (ref >60)
Glucose, Bld: 140 mg/dL — ABNORMAL HIGH (ref 65–99)
Potassium: 3.3 mmol/L — ABNORMAL LOW (ref 3.5–5.1)
Sodium: 141 mmol/L (ref 135–145)
Total Bilirubin: 2.5 mg/dL — ABNORMAL HIGH (ref 0.3–1.2)
Total Protein: 4.6 g/dL — ABNORMAL LOW (ref 6.5–8.1)

## 2017-03-02 LAB — GLUCOSE, CAPILLARY
GLUCOSE-CAPILLARY: 244 mg/dL — AB (ref 65–99)
Glucose-Capillary: 142 mg/dL — ABNORMAL HIGH (ref 65–99)
Glucose-Capillary: 141 mg/dL — ABNORMAL HIGH (ref 65–99)
Glucose-Capillary: 189 mg/dL — ABNORMAL HIGH (ref 65–99)

## 2017-03-02 LAB — PROTIME-INR
INR: 2.16
Prothrombin Time: 24.4 seconds — ABNORMAL HIGH (ref 11.4–15.2)

## 2017-03-02 LAB — COOXEMETRY PANEL
CARBOXYHEMOGLOBIN: 1.5 % (ref 0.5–1.5)
METHEMOGLOBIN: 0.9 % (ref 0.0–1.5)
O2 SAT: 68.3 %
TOTAL HEMOGLOBIN: 8.4 g/dL — AB (ref 12.0–16.0)

## 2017-03-02 MED ORDER — POTASSIUM CHLORIDE CRYS ER 20 MEQ PO TBCR
40.0000 meq | EXTENDED_RELEASE_TABLET | Freq: Once | ORAL | Status: AC
  Administered 2017-03-02: 40 meq via ORAL
  Filled 2017-03-02: qty 2

## 2017-03-02 MED ORDER — ACETAZOLAMIDE 250 MG PO TABS
250.0000 mg | ORAL_TABLET | Freq: Two times a day (BID) | ORAL | Status: DC
  Administered 2017-03-02 – 2017-03-04 (×6): 250 mg via ORAL
  Filled 2017-03-02 (×7): qty 1

## 2017-03-02 MED ORDER — WARFARIN SODIUM 1 MG PO TABS
1.0000 mg | ORAL_TABLET | Freq: Once | ORAL | Status: AC
  Administered 2017-03-02: 1 mg via ORAL
  Filled 2017-03-02: qty 1

## 2017-03-02 NOTE — Care Management Important Message (Signed)
Important Message  Patient Details  Name: [name] [name] MRN: [id number] Date of Birth: 07-31-1935   Medicare Important Message Given:  Yes    Nathen May 03/02/2017, 1:09 PM

## 2017-03-02 NOTE — Progress Notes (Signed)
CARDIAC REHAB PHASE I   PRE:  Rate/Rhythm: 92 SR PACs  BP:  Supine:   Sitting: 108/67  Standing:    SaO2: 100% 2L  MODE:  Ambulation: 150 ft   POST:  Rate/Rhythm: 108 ST PACs  BP:  Supine:   Sitting: 144/70  Standing:    SaO2: 92%RA  0820-0840 Pt walked 150 ft on RA with rolling walker and asst x 1 with steady gait. Did well on RA and no DOE noted. Encouraged pt to walk farther to increase endurance but she wanted to sit down. To recliner with call bell. To call RN if SOB on RA. Encouraged two more walks today and trying to go farther.   Graylon Good, RN BSN  03/02/2017 8:36 AM

## 2017-03-02 NOTE — Progress Notes (Signed)
[facility] team will continue to follow Ms. Costin progress as inpatient and be available for any home health/infusion needs as ordered at DC.  If patient discharges after hours, please call [phone].   Larry Sierras 03/02/2017, 8:21 AM

## 2017-03-02 NOTE — Progress Notes (Addendum)
[address]       [place] [address]             [phone]      25 Days Post-Op Procedure(s) (LRB): CORONARY ARTERY BYPASS GRAFTING (CABG) x 4                                                                               LIMA-LAD SEQ SVG-DIAG-OM SVG-PD (N/A) TRANSESOPHAGEAL ECHOCARDIOGRAM (TEE) (N/A) MITRAL VALVE REPLACEMENT (MVR) WITH MAGNA MITRAL EASE PERICARDIAL BIOPROSTHESIS 25 MM (N/A) Subjective: Says she is feeling better over the past 2 days  Objective: Vital signs in last 24 hours: Temp:  [97.9 F (36.6 C)-98.5 F (36.9 C)] 98.2 F (36.8 C) (03/12 0620) Pulse Rate:  [92-97] 95 (03/12 0620) Cardiac Rhythm: Normal sinus rhythm (03/11 1930) Resp:  [18-20] 18 (03/12 0620) BP: (119-127)/(63-73) 122/70 (03/12 0620) SpO2:  [96 %-100 %] 98 % (03/12 0620) Weight:  [165 lb 9.6 oz (75.1 kg)] 165 lb 9.6 oz (75.1 kg) (03/12 0620)  Hemodynamic parameters for last 24 hours:    Intake/Output from previous day: 03/11 0701 - 03/12 0700 In: 775.7 [P.O.:480; I.V.:295.7] Out: 4003 [Urine:4000; Stool:3] Intake/Output this shift: No intake/output data recorded.  General appearance: alert, cooperative and no distress Heart: irregular Lungs: dim in lower fields Abdomen: benign Extremities: + BLE edema- may bea little less Wound: incis healing well  Lab Results: No results for input(s): WBC, HGB, HCT, PLT in the last 72 hours. BMET:  Recent Labs  03/01/17 0447 03/02/17 0354  NA 142 141  K 3.5 3.3*  CL 98* 96*  CO2 36* 38*  GLUCOSE 156* 140*  BUN 40* 36*  CREATININE 2.26* 2.27*  CALCIUM 8.6* 8.6*    PT/INR:  Recent Labs  03/02/17 0354  LABPROT 24.4*  INR 2.16   ABG    Component Value Date/Time   PHART 7.377 02/09/2017 1603   HCO3 23.0 02/09/2017 1603   TCO2 26 02/11/2017 1618   ACIDBASEDEF 2.0 02/09/2017 1603   O2SAT 68.3 03/02/2017 0356   CBG (last 3)   Recent Labs  03/01/17 1651 03/01/17 2137 03/02/17 0625  GLUCAP 157* 173*  142*    Meds Scheduled Meds: . amiodarone  200 mg Oral BID  . atorvastatin  80 mg Oral q1800  . feeding supplement (GLUCERNA SHAKE)  237 mL Oral TID BM  . furosemide  80 mg Intravenous BID  . insulin aspart  0-15 Units Subcutaneous TID WC  . mouth rinse  15 mL Mouth Rinse q12n4p  . metolazone  2.5 mg Oral Once  . moving right along book   Does not apply Once  . pantoprazole sodium  40 mg Oral Daily  . potassium chloride  40 mEq Oral TID  . Warfarin - Physician Dosing Inpatient   Does not apply q1800   Continuous Infusions: . milrinone 0.25 mcg/kg/min (03/02/17 0621)   PRN Meds:.diphenhydrAMINE, hydrALAZINE, ondansetron (ZOFRAN) IV, RESOURCE THICKENUP CLEAR, sodium chloride flush, traMADol  Xrays No results found.  Assessment/Plan: S/P Procedure(s) (LRB): CORONARY ARTERY BYPASS GRAFTING (CABG) x 4  LIMA-LAD SEQ SVG-DIAG-OM SVG-PD (N/A) TRANSESOPHAGEAL ECHOCARDIOGRAM (TEE) (N/A) MITRAL VALVE REPLACEMENT (MVR) WITH MAGNA MITRAL EASE PERICARDIAL BIOPROSTHESIS 25 MM (N/A)  1 slow but steady progress 2 Co-ox 68, excellent diuresis yesterday and today, creat is stable at 2.2, cont K+  replacement 3 LFT's good. T bili stable 4 will give 1 mg coumadin today 5 AHF team doing primary medical management 6 QTc over 500, some pac's- cont current dose for now   LOS: 27 days    GOLD,WAYNE E 03/02/2017   Chart reviewed, patient examined, agree with above. She continues to improve and diuresing well. Weight is only 7.5 lbs over preop now. Milrinone down to 0.125 since Co-ox 68% this am.  CXR shows small left pleural effusion and LLL atelectasis.

## 2017-03-02 NOTE — Progress Notes (Signed)
Physical Therapy Treatment Patient Details Name: [name] [name] MRN: [id number] DOB: 1935/05/29 Today's Date: 03/02/2017    History of Present Illness 82 y.o. female S/p emergent CABG x 4 and bioprosthetic MVR for LCX NSTEMI with flail MV on 2/15. PMH significant for DM and HTN.    PT Comments    Pt progressing toward goals with increase in ambulation distance. Current d/c recommendation remains appropriate. PT will continue to follow.    Follow Up Recommendations  SNF;Supervision for mobility/OOB     Equipment Recommendations  Rolling walker with 5" wheels    Recommendations for Other Services       Precautions / Restrictions Precautions Precautions: Sternal;Fall Precaution Comments: recalled sternal precautions Restrictions Weight Bearing Restrictions: Yes Other Position/Activity Restrictions: sternal precautions    Mobility  Bed Mobility               General bed mobility comments: seated in chair upon PT arrival  Transfers Overall transfer level: Needs assistance Equipment used: Rolling walker (2 wheeled) Transfers: Sit to/from Stand Sit to Stand: Supervision         General transfer comment: supervision for safety, compliant with sternal precautions  Ambulation/Gait Ambulation/Gait assistance: Min guard Ambulation Distance (Feet): 200 Feet Assistive device: Rolling walker (2 wheeled) Gait Pattern/deviations: Step-through pattern;Decreased step length - right;Decreased step length - left;Decreased stride length;Trunk flexed;Wide base of support Gait velocity: decreased Gait velocity interpretation: Below normal speed for age/gender General Gait Details: v/c for upright posture and RW safety, decreased speed throughout ambulation   Stairs            Wheelchair Mobility    Modified Rankin (Stroke Patients Only)       Balance Overall balance assessment: Needs assistance Sitting-balance support: Feet supported;No upper extremity  supported Sitting balance-Leahy Scale: Good Sitting balance - Comments: sat edge of chair without LOB   Standing balance support: Bilateral upper extremity supported Standing balance-Leahy Scale: Poor Standing balance comment: reliant on RW                    Cognition Arousal/Alertness: Awake/alert Behavior During Therapy: WFL for tasks assessed/performed Overall Cognitive Status: Within Functional Limits for tasks assessed                 General Comments: recalled sternal precautions    Exercises      General Comments        Pertinent Vitals/Pain Pain Assessment: No/denies pain    Home Living                      Prior Function            PT Goals (current goals can now be found in the care plan section) Acute Rehab PT Goals Patient Stated Goal: go see husband Progress towards PT goals: Progressing toward goals    Frequency    Min 3X/week      PT Plan Current plan remains appropriate    Co-evaluation             End of Session Equipment Utilized During Treatment: Gait belt Activity Tolerance: Patient tolerated treatment well Patient left: in chair;with call bell/phone within reach;with family/visitor present Nurse Communication: Mobility status PT Visit Diagnosis: Unsteadiness on feet (R26.81);Other abnormalities of gait and mobility (R26.89);Muscle weakness (generalized) (M62.81);Difficulty in walking, not elsewhere classified (R26.2)     Time: 1010-1026 PT Time Calculation (min) (ACUTE ONLY): 16 min  Charges:  $Gait Training: 8-22 mins  G CodesTracie Harrier Mar 05, 2017, 11:06 AM   Tracie Harrier, SPT Acute Rehab SPT [phone]

## 2017-03-02 NOTE — Progress Notes (Signed)
Patient ID: [name] [name], female   DOB: 1935/08/07, 82 y.o.   MRN: [id number]     Advanced Heart Failure Rounding Note  PCP: Marden Noble, MD Primary Cardiologist: Dr. Yvone Neu [facility]) HF: Dr. Haroldine Laws   Subjective:    s/p emergent CABG x 4 and bioprosthetic MVR for LCX NSTEMI with flail MV on 2/15.  Post-op Echo with EF 40% RV normal, and stable MVR.  ECHO 3/4 EF 20-25% RV mild-moderately reduced. Trivial pericardial effusion.   ENT consulted 3/5 due to hoarseness. + L vocal cord paralysis.   CO-OX dropped so milrinone was restarted on 3/5.    Remains on milrinone 0.25 mcg. Yesterday diuresed with IV lasixx + metolazone. Brisk diuresis noted. Weight down another 6 pounds. Creatinine 2.26>2.27.   Feeling better. Denies SOB.   Objective:   Weight Range: 165 lb 9.6 oz (75.1 kg) Body mass index is 26.73 kg/m.   Vital Signs:   Temp:  [97.9 F (36.6 C)-98.5 F (36.9 C)] 98.2 F (36.8 C) (03/12 0620) Pulse Rate:  [92-97] 95 (03/12 0620) Resp:  [18-20] 18 (03/12 0620) BP: (119-127)/(63-73) 122/70 (03/12 0620) SpO2:  [96 %-100 %] 98 % (03/12 0620) Weight:  [165 lb 9.6 oz (75.1 kg)] 165 lb 9.6 oz (75.1 kg) (03/12 0620) Last BM Date: 03/01/17  Weight change: Filed Weights   02/28/17 0610 03/01/17 0521 03/02/17 0620  Weight: 172 lb 14.4 oz (78.4 kg) 171 lb 1.6 oz (77.6 kg) 165 lb 9.6 oz (75.1 kg)    Intake/Output:   Intake/Output Summary (Last 24 hours) at 03/02/17 0745 Last data filed at 03/02/17 0626  Gross per 24 hour  Intake           775.65 ml  Output             4003 ml  Net         -3227.35 ml     Physical Exam: General:  Sitting in chair. Hoarse voice.  NAD.     HEENT: Normal Neck: Supple, JVP ~10. 2+ bilat; no bruits. No thyromegaly or nodule noted.  Cor: PMI nondisplaced. Irregular. No M/G/R noted.  Lungs: CTAB on 2 liters oxygen Abdomen: soft, NT, ND, no HSM. No bruits or masses. +BS  Extremities: no cyanosis, clubbing, rash. RUE PICC.  R and  LLE 1-2+ edema.  + TED hose  Neuro: A&Ox3 follows commands and is oriented. Moves all 4  Telemetry: Personally reviewed, NSR 80-90s. Occasional PVCs  Labs: CBC No results for input(s): WBC, NEUTROABS, HGB, HCT, MCV, PLT in the last 72 hours. Basic Metabolic Panel  Recent Labs  03/01/17 0447 03/02/17 0354  NA 142 141  K 3.5 3.3*  CL 98* 96*  CO2 36* 38*  GLUCOSE 156* 140*  BUN 40* 36*  CREATININE 2.26* 2.27*  CALCIUM 8.6* 8.6*   Liver Function Tests  Recent Labs  03/01/17 0447 03/02/17 0354  AST 35 37  ALT 26 26  ALKPHOS 98 94  BILITOT 2.7* 2.5*  PROT 4.6* 4.6*  ALBUMIN 2.2* 2.4*   No results for input(s): LIPASE, AMYLASE in the last 72 hours. Cardiac Enzymes No results for input(s): CKTOTAL, CKMB, CKMBINDEX, TROPONINI in the last 72 hours.  BNP: BNP (last 3 results) No results for input(s): BNP in the last 8760 hours.  ProBNP (last 3 results) No results for input(s): PROBNP in the last 8760 hours.   D-Dimer No results for input(s): DDIMER in the last 72 hours. Hemoglobin A1C No results for input(s): HGBA1C in  the last 72 hours. Fasting Lipid Panel No results for input(s): CHOL, HDL, LDLCALC, TRIG, CHOLHDL, LDLDIRECT in the last 72 hours. Thyroid Function Tests No results for input(s): TSH, T4TOTAL, T3FREE, THYROIDAB in the last 72 hours.  Invalid input(s): FREET3  Other results:     Imaging/Studies:  No results found.    Medications:     Scheduled Medications: . amiodarone  200 mg Oral BID  . atorvastatin  80 mg Oral q1800  . feeding supplement (GLUCERNA SHAKE)  237 mL Oral TID BM  . furosemide  80 mg Intravenous BID  . insulin aspart  0-15 Units Subcutaneous TID WC  . mouth rinse  15 mL Mouth Rinse q12n4p  . metolazone  2.5 mg Oral Once  . moving right along book   Does not apply Once  . pantoprazole sodium  40 mg Oral Daily  . potassium chloride  40 mEq Oral TID  . warfarin  1 mg Oral ONCE-1800  . Warfarin - Physician Dosing  Inpatient   Does not apply q1800    Infusions: . milrinone 0.25 mcg/kg/min (03/02/17 0621)    PRN Medications: diphenhydrAMINE, hydrALAZINE, ondansetron (ZOFRAN) IV, RESOURCE THICKENUP CLEAR, sodium chloride flush, traMADol   Assessment/Plan   1. NSTEMI with CAD [name] on 2/15 2. Severe MR with flail segment s/p MVR c/ bioprosthetic valve. 3. Cardiogenic shock: Echo pre-op with EF 40-45%, lateral akinesis, severe MR.     --Echo 2/22 EF 40% MVR ok. Echo 3/5 EF 20-25% 4. AKI 5. Shock Liver 6. PAF 7. Hypokalemia 8. L vocal cord paralysis  Co-ox 68% on milrinone 0.25 mcg. Cut back to 0.125 mcg. Continue IV lasix 80 mg twice a day. Add 250 mg diamox twice a day. Needs another day of IV diuresis. Renal function stable. BMET stable. Supplement potassium.   On po amio. Maintaining NSR. INR 2.16   Continue statin. No BB for now on milrinone. Off ASA with coumadin.   Darrick Grinder, NP  7:45 AM Advanced Heart Failure Team Pager [phone] (M-F; 7a - 4p)  Please contact [facility] for night-coverage after hours (4p -7a ) and weekends on [email or web address]  Patient seen and examined with Darrick Grinder, NP. We discussed all aspects of the encounter. I agree with the assessment and plan as stated above.   Diuresing well. Still volume overloaded. Co-ox ok. Renal function stable. K low. Will supp. Can decrease milrinone to 0.125. Continue lasix and add diamox.   Maintaining NSR on po amio. INR 2.16.  Continue to ambulate.   Railey Glad,MD 3:46 PM

## 2017-03-03 LAB — COMPREHENSIVE METABOLIC PANEL
ALK PHOS: 96 U/L (ref 38–126)
ALT: 28 U/L (ref 14–54)
AST: 36 U/L (ref 15–41)
Albumin: 2.5 g/dL — ABNORMAL LOW (ref 3.5–5.0)
Anion gap: 9 (ref 5–15)
BUN: 33 mg/dL — ABNORMAL HIGH (ref 6–20)
CALCIUM: 8.8 mg/dL — AB (ref 8.9–10.3)
CO2: 36 mmol/L — AB (ref 22–32)
CREATININE: 2.28 mg/dL — AB (ref 0.44–1.00)
Chloride: 96 mmol/L — ABNORMAL LOW (ref 101–111)
GFR calc Af Amer: 22 mL/min — ABNORMAL LOW (ref >60)
GFR calc non Af Amer: 19 mL/min — ABNORMAL LOW (ref >60)
GLUCOSE: 139 mg/dL — AB (ref 65–99)
Potassium: 3.3 mmol/L — ABNORMAL LOW (ref 3.5–5.1)
SODIUM: 141 mmol/L (ref 135–145)
Total Bilirubin: 2.6 mg/dL — ABNORMAL HIGH (ref 0.3–1.2)
Total Protein: 4.8 g/dL — ABNORMAL LOW (ref 6.5–8.1)

## 2017-03-03 LAB — GLUCOSE, CAPILLARY
GLUCOSE-CAPILLARY: 228 mg/dL — AB (ref 65–99)
GLUCOSE-CAPILLARY: 155 mg/dL — AB (ref 65–99)
Glucose-Capillary: 136 mg/dL — ABNORMAL HIGH (ref 65–99)
Glucose-Capillary: 164 mg/dL — ABNORMAL HIGH (ref 65–99)

## 2017-03-03 LAB — COOXEMETRY PANEL
Carboxyhemoglobin: 1.5 % (ref 0.5–1.5)
Methemoglobin: 0.8 % (ref 0.0–1.5)
O2 Saturation: 58.5 %
Total hemoglobin: 13.5 g/dL (ref 12.0–16.0)

## 2017-03-03 LAB — PROTIME-INR
INR: 2.01
Prothrombin Time: 23.1 seconds — ABNORMAL HIGH (ref 11.4–15.2)

## 2017-03-03 LAB — MAGNESIUM: Magnesium: 1.7 mg/dL (ref 1.7–2.4)

## 2017-03-03 MED ORDER — HYDRALAZINE HCL 25 MG PO TABS
12.5000 mg | ORAL_TABLET | Freq: Three times a day (TID) | ORAL | Status: DC
  Administered 2017-03-03 – 2017-03-05 (×8): 12.5 mg via ORAL
  Filled 2017-03-03 (×8): qty 1

## 2017-03-03 MED ORDER — PANTOPRAZOLE SODIUM 40 MG PO TBEC
40.0000 mg | DELAYED_RELEASE_TABLET | Freq: Every day | ORAL | Status: DC
  Administered 2017-03-04 – 2017-03-05 (×2): 40 mg via ORAL
  Filled 2017-03-03 (×2): qty 1

## 2017-03-03 MED ORDER — POTASSIUM CHLORIDE CRYS ER 20 MEQ PO TBCR
60.0000 meq | EXTENDED_RELEASE_TABLET | Freq: Three times a day (TID) | ORAL | Status: DC
  Administered 2017-03-03 – 2017-03-04 (×5): 60 meq via ORAL
  Filled 2017-03-03 (×5): qty 3

## 2017-03-03 MED ORDER — WARFARIN SODIUM 2.5 MG PO TABS
2.5000 mg | ORAL_TABLET | Freq: Once | ORAL | Status: AC
  Administered 2017-03-03: 2.5 mg via ORAL
  Filled 2017-03-03: qty 1

## 2017-03-03 MED ORDER — ISOSORBIDE MONONITRATE ER 30 MG PO TB24
15.0000 mg | ORAL_TABLET | Freq: Every day | ORAL | Status: DC
  Administered 2017-03-03 – 2017-03-05 (×3): 15 mg via ORAL
  Filled 2017-03-03 (×3): qty 1

## 2017-03-03 NOTE — Progress Notes (Signed)
  Speech Language Pathology Treatment: Dysphagia  Patient Details Name: [name] [name] MRN: [id number] DOB: 28-Jun-1935 Today's Date: 03/03/2017 Time: 1021-1029 SLP Time Calculation (min) (ACUTE ONLY): 8 min  Assessment / Plan / Recommendation Clinical Impression  Pt with no improvement in vocal quality; continues on modified diet with honey-thick liquids with overall adequate toleration.  Able to implement exercises (chin tuck against resistance) in effort to strengthen submental musculature at Mod I level of cueing.  Potential for D/C end of week - if clinical improvement before then, may repeat swallow study; otherwise may need to schedule as OP s/p ENT f/u.   HPI HPI: [name] [name] is a 82 year old woman with history of type 2 diabetes mellitus and hypertension, who presented to cardiology clinic due to acute chest pain that began 3 days ago while doing light housework. EKG showed abnormalities concerning for ischemia. Urgent cardiac cath revealed three-vessel CAD, including serial 70 and 80% proximal/mid LAD stenoses. Underwent CABG x 4 2/15 and extubated 2/19. CXR No pneumothorax or pulmonary edema. Left lung base atelectasis and possible small pleural effusion. Previous MBS on 02/13/2017-recommended Dys 1, honey thick. Evaluated by ENT on 3/2, dx left vocal cord paralysis in the paramedian position, resulting in a glottic gap. FEES on 02/24/2017 for potential liberalization of diet, however could not visualize laryngeal/pharyngeal area leading to order for repeat MBS.       SLP Plan  Continue with current plan of care       Recommendations  Diet recommendations: Dysphagia 3 (mechanical soft);Honey-thick liquid Liquids provided via: Cup Medication Administration: Crushed with puree Supervision: Patient able to self feed;Intermittent supervision to cue for compensatory strategies Compensations: Slow rate;Small sips/bites;Multiple dry swallows after each bite/sip Postural Changes and/or  Swallow Maneuvers: Seated upright 90 degrees                Oral Care Recommendations: Oral care BID Follow up Recommendations: Skilled Nursing facility SLP Visit Diagnosis: Dysphagia, pharyngeal phase (R13.13) Plan: Continue with current plan of care       GO                [name] [name] 03/03/2017, 12:51 PM

## 2017-03-03 NOTE — Progress Notes (Addendum)
Patient ID: [name] [name], female   DOB: 1935-08-24, 82 y.o.   MRN: [id number]     Advanced Heart Failure Rounding Note  PCP: Marden Noble, MD Primary Cardiologist: Dr. Yvone Neu [facility]) HF: Dr. Haroldine Laws   Subjective:    s/p emergent CABG x 4 and bioprosthetic MVR for LCX NSTEMI with flail MV on 2/15.  Post-op Echo with EF 40% RV normal, and stable MVR.  ECHO 3/4 EF 20-25% RV mild-moderately reduced. Trivial pericardial effusion.   ENT consulted 3/5 due to hoarseness. + L vocal cord paralysis.   CO-OX dropped to 51, so milrinone was restarted on 3/5. Milrinone dropped to 0.25 on 3/12. Co-ox 59% today on milrinone 0.125   Yesterday, diuresed with IV lasix 80mg  BID + metolazone 2.5mg . Diamox 250mg  BID added yesterday, brisk diuresis noted. Weight down another 6 pounds. Creatinine 2.26>2.27->2.28.   Feeling better. Denies SOB. Eager to go home    Objective:   Weight Range: 159 lb 4.8 oz (72.3 kg) Body mass index is 25.71 kg/m.   Vital Signs:   Temp:  [97.5 F (36.4 C)-98.6 F (37 C)] 98.4 F (36.9 C) (03/13 0611) Pulse Rate:  [90-100] 100 (03/13 0611) Resp:  [18] 18 (03/13 0611) BP: (117-121)/(63-68) 120/68 (03/13 0611) SpO2:  [97 %-99 %] 97 % (03/13 0611) Weight:  [159 lb 4.8 oz (72.3 kg)] 159 lb 4.8 oz (72.3 kg) (03/13 0611) Last BM Date: 03/02/17  Weight change: Filed Weights   03/01/17 0521 03/02/17 0620 03/03/17 0611  Weight: 171 lb 1.6 oz (77.6 kg) 165 lb 9.6 oz (75.1 kg) 159 lb 4.8 oz (72.3 kg)    Intake/Output:   Intake/Output Summary (Last 24 hours) at 03/03/17 0851 Last data filed at 03/03/17 0616  Gross per 24 hour  Intake           645.96 ml  Output             4300 ml  Net         -3654.04 ml     Physical Exam: General: Sitting in chair, NAD.     HEENT: Voice remains hoarse.  Neck: Supple, JVP 7-8 cm. 2+ bilat; no bruits. No thyromegaly or nodule noted.  Cor: PMI nondisplaced. Irregular. No M/G/R noted.  Lungs: CTA in all lobes.    Abdomen: soft, NT, ND, no HSM. No bruits or masses. +BS  Extremities: no cyanosis, clubbing, rash. RUE PICC.  R and LLE 1+ edema.  + TED hose  Neuro: A&Ox3 follows commands and is oriented. Moves all 4  Telemetry: Personally reviewed, NSR 80-90s. Occasional PAC's  Labs: Basic Metabolic Panel  Recent Labs  03/02/17 0354 03/03/17 0425  NA 141 141  K 3.3* 3.3*  CL 96* 96*  CO2 38* 36*  GLUCOSE 140* 139*  BUN 36* 33*  CREATININE 2.27* 2.28*  CALCIUM 8.6* 8.8*  MG  --  1.7   Liver Function Tests  Recent Labs  03/02/17 0354 03/03/17 0425  AST 37 36  ALT 26 28  ALKPHOS 94 96  BILITOT 2.5* 2.6*  PROT 4.6* 4.8*  ALBUMIN 2.4* 2.5*    Imaging/Studies: Transthoracic Echocardiography 02/22/17 Study Conclusions  - Left ventricle: The cavity size was moderately dilated. Wall   thickness was normal. Systolic function was severely reduced. The   estimated ejection fraction was in the range of 20% to 25%.   Akinesis of the basal-midlateral and inferior myocardium. - Mitral valve: A bioprosthesis was present. There was mild   regurgitation. - Left  atrium: The atrium was dilated. - Right ventricle: Systolic function was mildly to moderately   reduced. - Tricuspid valve: There was moderate regurgitation. - Pulmonary arteries: Systolic pressure was moderately increased.   PA peak pressure: 51 mm Hg (S). - Pericardium, extracardiac: A trivial pericardial effusion was   identified. There was a left pleural effusion.    Medications:     Scheduled Medications: . acetaZOLAMIDE  250 mg Oral BID  . amiodarone  200 mg Oral BID  . atorvastatin  80 mg Oral q1800  . feeding supplement (GLUCERNA SHAKE)  237 mL Oral TID BM  . furosemide  80 mg Intravenous BID  . insulin aspart  0-15 Units Subcutaneous TID WC  . mouth rinse  15 mL Mouth Rinse q12n4p  . metolazone  2.5 mg Oral Once  . moving right along book   Does not apply Once  . pantoprazole sodium  40 mg Oral Daily  .  potassium chloride  40 mEq Oral TID  . Warfarin - Physician Dosing Inpatient   Does not apply q1800    Infusions: . milrinone 0.125 mcg/kg/min (03/03/17 0706)    PRN Medications: diphenhydrAMINE, hydrALAZINE, ondansetron (ZOFRAN) IV, RESOURCE THICKENUP CLEAR, sodium chloride flush, traMADol   Assessment/Plan   1. NSTEMI with CAD [name] on 2/15 2. Severe MR with flail segment s/p MVR c/ bioprosthetic valve. 3. Cardiogenic shock: Echo pre-op with EF 40-45%, lateral akinesis, severe MR.     --Echo 2/22 EF 40% MVR ok. Echo 3/5 EF 20-25% 4. AKI 5. Shock Liver 6. PAF 7. Hypokalemia 8. L vocal cord paralysis  Co-ox 58.5 this am on milrinone 0.125 mcg. Weight down to 159 pounds, pre op weight was 158 lbs.  Volume status improving, continue IV lasix 80 mg twice a day. Will likely transition to po Lasix tomorrow.   On po amio. Maintaining NSR. INR 2.01, pharmacy dosing.    Continue statin. No BB for now on milrinone. Off ASA with coumadin.   Arbutus Leas, NP  8:51 AM Advanced Heart Failure Team Pager [phone] (M-F; 7a - 4p)  Please contact [facility] for night-coverage after hours (4p -7a ) and weekends on [email or web address]  Patient seen and examined with Jettie Booze, NP. We discussed all aspects of the encounter. I agree with the assessment and plan as stated above.   Continues to improve. Co-ox ok on milrinone 0.125. Will diurese one more day then attempt to wean off milrinone and get her to [facility] by end of the week,.Renal function stable. Supp K.  Start hydralazine 12.5 tid and imdur 15 for LV dysfunction. No ACE/b-blocker yet.   Continue to mobilize.   Bensimhon, Daniel,MD 10:11 AM

## 2017-03-03 NOTE — Discharge Summary (Signed)
Physician Discharge Summary  Patient ID: [name] [name] MRN: [id number] DOB/AGE: July 16, 1935 82 y.o.  Admit date: 02/03/2017 Discharge date: 03/05/2017  Admission Diagnoses:[place] with 3 vessel CAD  Discharge Diagnoses:  Principal Problem:   Unstable angina ([place]) Active Problems:   [name]   Murmur   AKI (acute kidney injury) ([place])   Type 2 diabetes mellitus with complication ([place])   Essential hypertension   Cardiogenic shock ([place])   Acute hypoxemic respiratory failure ([facility])   [name]   [name]   Patient Active Problem List   Diagnosis Date Noted  . [name]   . [name] 02/05/2017  . Acute hypoxemic respiratory failure ([place])   . Cardiogenic shock ([place])   . Unstable angina ([place]) 02/03/2017  . [name] 02/03/2017  . Murmur 02/03/2017  . AKI (acute kidney injury) ([place]) 02/03/2017  . Type 2 diabetes mellitus with complication ([place]) 13/24/4010  . Essential hypertension 02/03/2017  . Absolute anemia 09/19/2014  . Encounter for screening colonoscopy 09/19/2014   History of Present Illness:  at time of consultation This is a 82 year old female with a past medical history of type 2 diabetes mellitus, hypertension, hyperlipidemia, acute kidney injury, and a history of glaucoma who was seen on 02/03/2017 by Dr. Farrel Conners with a new complaint of chest pain. She was found to have unstable angina with EKG changes and therefore underwent an emergent cardiac catheterization which showed three-vessel coronary artery disease. She also has a history of a murmur, therefore an echocardiogram was performed today. The results are pending. She does have a surgical history significant for parathyroidectomy in 1986. She is not on any thyroid replacement therapy. We were consulted for evaluation of candidacy for revascularization. She is currently on heparin drip and pain free.  Discharged Condition: good  Hospital  Course: The patient has had a very slow but steady recovery following surgery. She has been treated aggressively by the advanced heart failure team and has shown significant improvement over time. She has remained neurologically intact. She has had a slow physical recovery due to debilitation and will require skilled nursing at time of discharge for further rehabilitation. She has had a moderate renal insufficiency which has stabilized over time this recent creatinine 2.3. She has required aggressive potassium supplementation. She did have some initial increase in liver function studies including total bilirubin which has improved over time and felt to be related to shock liver. She has had postoperative atrial fibrillation and this has been chemically converted to sinus rhythm with amiodarone. She is on Coumadin and dosage has been adjusted frequently. Most recent INR today is 2.2 and her current dose is 2 milligrams daily. Her incisions are healing well without evidence of infection. She is had some swallowing difficulty and is on a dysphasia 3 diet. She also has been evaluated by ENT and has a vocal cord paralysis which will require future treatment by them. At time of discharge she is felt to be stable for transfer to the skilled nursing facility.   Consults: cardiology and pulmonary/intensive care  Significant Diagnostic Studies: angiography: cardiac cath  Treatments: surgery:  CARDIOVASCULAR SURGERY OPERATIVE NOTE  02/05/2017  Surgeon:  Gaye Pollack, MD  First Assistant: Jadene Pierini,  PA-C   Preoperative Diagnosis:  Severe multi-vessel coronary artery disease and severe mitral regurgitation due to acute papillary muscle rupture with cardiogenic shock and multi-system organ failure.   Postoperative Diagnosis:  Same   Procedure:Surgery  1. Median Sternotomy 2. Extracorporeal  circulation 3.   Coronary artery bypass grafting x 4   Left internal mammary graft to the LAD  Sequential SVG to diagonal and OM  SVG to PDA  4.   Endoscopic vein harvest from the right leg 5.   Chordal sparing Mitral valve replacement using a 25 mm Edwards Magna-Ease pericardial valve  Anesthesia:  General Endotracheal     Discharge Exam: Blood pressure 134/75, pulse 90, temperature 98.8 F (37.1 C), temperature source Oral, resp. rate 18, height 5\' 6"  (1.676 m), weight 152 lb 6.4 oz (69.1 kg), SpO2 99 %.  General appearance: alert, cooperative and no distress Heart: regular rate and rhythm Lungs: slightly dim in left base Abdomen: benign Extremities: edema conts to improve Wound: incis healing well  Disposition: 02-Transferred to [facility]  Discharge Instructions    Discharge patient    Complete by:  As directed    Discharge disposition:  03-Skilled [facility]   Discharge patient date:  03/05/2017     Allergies as of 03/05/2017   No Known Allergies     Medication List    STOP taking these medications   naproxen 375 MG tablet Commonly known as:  NAPROSYN   olmesartan-hydrochlorothiazide 40-12.5 MG tablet Commonly known as:  BENICAR HCT   ONGLYZA 5 MG Tabs tablet Generic drug:  saxagliptin HCl   polyethylene glycol powder powder Commonly known as:  GLYCOLAX/MIRALAX   VIMOVO 500-20 MG Tbec Generic drug:  Naproxen-Esomeprazole     TAKE these medications   acetaminophen 500 MG tablet Commonly known as:  TYLENOL Take 500 mg by mouth every 6 (six) hours as needed for mild pain.   amiodarone 200 MG tablet Commonly known as:  PACERONE Take 1 tablet (200 mg total) by mouth 2 (two) times daily. For 7 days, then once daily   atorvastatin 80 MG tablet Commonly known as:  LIPITOR Take 1 tablet (80 mg total) by mouth daily at 6 PM.   CALCIUM + D PO Take 1 tablet by mouth daily.   furosemide 40 MG tablet Commonly known as:   LASIX Take 1 tablet (40 mg total) by mouth 2 (two) times daily.   hydrALAZINE 25 MG tablet Commonly known as:  APRESOLINE Take 1 tablet (25 mg total) by mouth 3 (three) times daily.   isosorbide mononitrate 30 MG 24 hr tablet Commonly known as:  IMDUR Take 1 tablet (30 mg total) by mouth daily. Start taking on:  03/06/2017   latanoprost 0.005 % ophthalmic solution Commonly known as:  XALATAN Place 1 drop into both eyes at bedtime.   potassium chloride SA 20 MEQ tablet Commonly known as:  K-DUR,KLOR-CON Take 2 tablets (40 mEq total) by mouth 2 (two) times daily.   traMADol 50 MG tablet Commonly known as:  ULTRAM Take 1-2 tablets (50-100 mg total) by mouth every 12 (twelve) hours as needed for moderate pain.   warfarin 1 MG tablet Commonly known as:  COUMADIN Take 2 tablets (2 mg total) by mouth daily.      Follow-up Information    Gaye Pollack, MD Follow up.   Specialty:  Cardiothoracic Surgery Why:  Please see discharge paperwork for instructions.. Please also obtain a chest x-ray at [facility] one half hour prior to the appointment with Dr. Cyndia Bent. [facility] is in the same office complex. Contact information: [address] [place] [address] [phone]        Wende Bushy, MD Follow up.   Specialty:  Cardiology Why:  Please see discharge instructions for appointment. Contact information: [place] [address] [phone]        Glori Bickers, MD Follow up on 03/12/2017.   Specialty:  Cardiology Why:  at 9:20 for hospital follow up. Garage code is [id number]# Contact information: [address] [place] [address] [phone]          1. Please obtain vital signs at least one time daily 2.Please weigh the patient daily. If he or she continues to gain weight or develops lower extremity edema, contact the office at [phone]. 3. Ambulate patient at least three times daily  and please use sternal precautions. 4 check PT INR to adjust coumadin for INR 2-2.5 next week and adjust dose accordingly.  The patient has been discharged on:   1.Beta Blocker:  Yes [   ]                              No   [  n ]                              If No, reason:AHF team has dictated mecine choices and did not put on betablocker d/t heart failure  2.Ace Inhibitor/ARB: Yes [   ]                                     No  [  n  ]                                     If No, reason:renal insuff  3.Statin:   Yes [  y ]                  No  [   ]                  If No, reason:  4.Ecasa:  Yes  [   ]                  No   [ n  ]                  If No, reason:on full dose coumadin    Signed: Murle Otting E 03/05/2017, 10:07 AM

## 2017-03-03 NOTE — Progress Notes (Signed)
CARDIAC REHAB PHASE I   PRE:  Rate/Rhythm: 85 SR PACs  BP:  Supine:   Sitting: 116/60  Standing:    SaO2: 93%RA  MODE:  Ambulation: 150 ft   POST:  Rate/Rhythm: 105 ST  BP:  Supine:   Sitting: 122/74  Standing:    SaO2: 97%RA 1120-1150 [name]. Walked 150 ft on RA with rolling walker and asst x 1. Pt also c/o legs feeling weak. To recliner to rest and then to [facility]. She did not feel she could go farther with walk today.   Graylon Good, RN BSN  03/03/2017 11:45 AM

## 2017-03-03 NOTE — Progress Notes (Addendum)
[address]       [place] [address]             [phone]      26 Days Post-Op Procedure(s) (LRB): CORONARY ARTERY BYPASS GRAFTING (CABG) x 4                                                                               LIMA-LAD SEQ SVG-DIAG-OM SVG-PD (N/A) TRANSESOPHAGEAL ECHOCARDIOGRAM (TEE) (N/A) MITRAL VALVE REPLACEMENT (MVR) WITH MAGNA MITRAL EASE PERICARDIAL BIOPROSTHESIS 25 MM (N/A) Subjective: conts to feel better, ambulation improving, not SOB, excellent diuresis  Objective: Vital signs in last 24 hours: Temp:  [97.5 F (36.4 C)-98.6 F (37 C)] 98.4 F (36.9 C) (03/13 0611) Pulse Rate:  [90-100] 100 (03/13 0611) Cardiac Rhythm: Normal sinus rhythm (03/12 1900) Resp:  [18] 18 (03/13 0611) BP: (117-121)/(63-68) 120/68 (03/13 0611) SpO2:  [97 %-99 %] 97 % (03/13 0611) Weight:  [159 lb 4.8 oz (72.3 kg)] 159 lb 4.8 oz (72.3 kg) (03/13 0611)  Hemodynamic parameters for last 24 hours:    Intake/Output from previous day: 03/12 0701 - 03/13 0700 In: 646 [P.O.:600; I.V.:46] Out: 4300 [Urine:4300] Intake/Output this shift: No intake/output data recorded.  General appearance: alert, cooperative and no distress Heart: regular rate and rhythm Lungs: dim left base Abdomen: benign Extremities: edema slowly improving  Lab Results: No results for input(s): WBC, HGB, HCT, PLT in the last 72 hours. BMET:  Recent Labs  03/02/17 0354 03/03/17 0425  NA 141 141  K 3.3* 3.3*  CL 96* 96*  CO2 38* 36*  GLUCOSE 140* 139*  BUN 36* 33*  CREATININE 2.27* 2.28*  CALCIUM 8.6* 8.8*    PT/INR:  Recent Labs  03/03/17 0425  LABPROT 23.1*  INR 2.01   ABG    Component Value Date/Time   PHART 7.377 02/09/2017 1603   HCO3 23.0 02/09/2017 1603   TCO2 26 02/11/2017 1618   ACIDBASEDEF 2.0 02/09/2017 1603   O2SAT 58.5 03/03/2017 0440   CBG (last 3)   Recent Labs  03/02/17 1632 03/02/17 2051 03/03/17 0609  GLUCAP 141* 189* 136*     Meds Scheduled Meds: . acetaZOLAMIDE  250 mg Oral BID  . amiodarone  200 mg Oral BID  . atorvastatin  80 mg Oral q1800  . feeding supplement (GLUCERNA SHAKE)  237 mL Oral TID BM  . furosemide  80 mg Intravenous BID  . insulin aspart  0-15 Units Subcutaneous TID WC  . mouth rinse  15 mL Mouth Rinse q12n4p  . metolazone  2.5 mg Oral Once  . moving right along book   Does not apply Once  . pantoprazole sodium  40 mg Oral Daily  . potassium chloride  40 mEq Oral TID  . Warfarin - Physician Dosing Inpatient   Does not apply q1800   Continuous Infusions: . milrinone 0.125 mcg/kg/min (03/03/17 0706)   PRN Meds:.diphenhydrAMINE, hydrALAZINE, ondansetron (ZOFRAN) IV, RESOURCE THICKENUP CLEAR, sodium chloride flush, traMADol  Xrays Dg Chest Port 1 View  Result Date: 03/02/2017 CLINICAL DATA:  Shortness of breath and pleural effusion EXAM: PORTABLE CHEST 1 VIEW COMPARISON:  February 26, 2017 FINDINGS: There is persistent  airspace consolidation in the left lower lobe with small left pleural effusion. Right lung is now clear except for atelectasis in the medial right base. There is cardiomegaly with pulmonary vascularity within normal limits. Patient is status post mitral valve replacement. There is atherosclerotic calcification in the aorta. No pneumothorax. No adenopathy. No bone lesions. IMPRESSION: Persistent airspace consolidation left lower lobe with left pleural effusion. Atelectasis medial right base. Right lung otherwise clear. Stable cardiomegaly. There is aortic atherosclerosis. Electronically Signed   By: Lowella Grip III M.D.   On: 03/02/2017 07:50    Assessment/Plan: S/P Procedure(s) (LRB): CORONARY ARTERY BYPASS GRAFTING (CABG) x 4                                                                               LIMA-LAD SEQ SVG-DIAG-OM SVG-PD (N/A) TRANSESOPHAGEAL ECHOCARDIOGRAM (TEE) (N/A) MITRAL VALVE REPLACEMENT (MVR) WITH MAGNA MITRAL EASE PERICARDIAL BIOPROSTHESIS 25 MM  (N/A)  1 progressing 2 CO-ox 58 with milrinone at .125- wean per AHF team 3 sugars reasonably controlled- cont diet control for now 4 excellent diuresis conts 5 replace K+ at current dose 6 INR pending 7 poss home in next couple days      LOS: 28 days    GOLD,WAYNE E 03/03/2017   Chart reviewed, patient examined, agree with above. She is at preop weight but still has pitting edema in legs and feet. Diuresing well. Continuing milrinone for another day per AHF team. She is eating well and voice getting stronger.

## 2017-03-03 NOTE — Progress Notes (Addendum)
Nutrition Follow Up  DOCUMENTATION CODES:   Not applicable  INTERVENTION:    Continue Glucerna Shake po TID, each supplement provides 220 kcal and 10 grams of protein   NUTRITION DIAGNOSIS:   Inadequate oral intake related to dysphagia, poor appetite as evidenced by meal completion < 25%, improved  GOAL:   Patient will meet greater than or equal to 90% of their needs, progressing  MONITOR:   PO intake, Supplement acceptance, Labs, Weight trends, I & O's  ASSESSMENT:   82 yo [place] with HTN and DM found to have significant three-vessel CAD in the setting of new chest pain with minimal exertion over the last 3 days consistent with unstable angina.   Pt s/p procedures 2/15: CABG, MITRAL VALVE REPAIR  Pt s/p FEES 3/6. Continues on Dys 3-honey thick liquids. PO intake variable at 25-75% per flowsheets. Receiving Glucerna Shake supplements. CBG's [phone].  Diet Order:  DIET DYS 3 Room service appropriate? Yes with Assist; Fluid consistency: Honey Thick; Fluid restriction: 2000 mL Fluid  Skin:  Reviewed, no issues  Last BM:  3/12  Height:   Ht Readings from Last 1 Encounters:  02/19/17 5\' 6"  (1.676 m)    Weight:   Wt Readings from Last 1 Encounters:  03/03/17 159 lb 4.8 oz (72.3 kg)    Ideal Body Weight:  59 kg  BMI:  Body mass index is 25.71 kg/m.  Estimated Nutritional Needs:   Kcal:  1800-2000  Protein:  95-105 gm  Fluid:  1.8-2.0 L  EDUCATION NEEDS:   No education needs identified at this time  Arthur Holms, RD, LDN Pager #: [phone] After-Hours Pager #: [phone]

## 2017-03-03 NOTE — Plan of Care (Signed)
Problem: Fluid Volume: Goal: Ability to maintain a balanced intake and output will improve Outcome: Progressing Diet is improving

## 2017-03-04 LAB — BASIC METABOLIC PANEL
Anion gap: 10 (ref 5–15)
BUN: 30 mg/dL — AB (ref 6–20)
CHLORIDE: 98 mmol/L — AB (ref 101–111)
CO2: 33 mmol/L — AB (ref 22–32)
CREATININE: 2.44 mg/dL — AB (ref 0.44–1.00)
Calcium: 8.7 mg/dL — ABNORMAL LOW (ref 8.9–10.3)
GFR calc Af Amer: 21 mL/min — ABNORMAL LOW (ref >60)
GFR calc non Af Amer: 18 mL/min — ABNORMAL LOW (ref >60)
Glucose, Bld: 145 mg/dL — ABNORMAL HIGH (ref 65–99)
POTASSIUM: 3.5 mmol/L (ref 3.5–5.1)
Sodium: 141 mmol/L (ref 135–145)

## 2017-03-04 LAB — GLUCOSE, CAPILLARY
GLUCOSE-CAPILLARY: 164 mg/dL — AB (ref 65–99)
Glucose-Capillary: 151 mg/dL — ABNORMAL HIGH (ref 65–99)
Glucose-Capillary: 135 mg/dL — ABNORMAL HIGH (ref 65–99)
Glucose-Capillary: 138 mg/dL — ABNORMAL HIGH (ref 65–99)

## 2017-03-04 LAB — PROTIME-INR
INR: 1.9
PROTHROMBIN TIME: 22.1 s — AB (ref 11.4–15.2)

## 2017-03-04 LAB — COOXEMETRY PANEL
Carboxyhemoglobin: 2.1 % — ABNORMAL HIGH (ref 0.5–1.5)
Methemoglobin: 0.7 % (ref 0.0–1.5)
O2 SAT: 61 %
TOTAL HEMOGLOBIN: 9.5 g/dL — AB (ref 12.0–16.0)

## 2017-03-04 MED ORDER — WARFARIN SODIUM 2.5 MG PO TABS
2.5000 mg | ORAL_TABLET | Freq: Once | ORAL | Status: AC
  Administered 2017-03-04: 2.5 mg via ORAL
  Filled 2017-03-04: qty 1

## 2017-03-04 MED ORDER — FUROSEMIDE 40 MG PO TABS
40.0000 mg | ORAL_TABLET | Freq: Two times a day (BID) | ORAL | Status: DC
  Administered 2017-03-04 – 2017-03-05 (×4): 40 mg via ORAL
  Filled 2017-03-04 (×4): qty 1

## 2017-03-04 NOTE — Progress Notes (Addendum)
[address]       [place] [address]             [phone]      27 Days Post-Op Procedure(s) (LRB): CORONARY ARTERY BYPASS GRAFTING (CABG) x 4                                                                               LIMA-LAD SEQ SVG-DIAG-OM SVG-PD (N/A) TRANSESOPHAGEAL ECHOCARDIOGRAM (TEE) (N/A) MITRAL VALVE REPLACEMENT (MVR) WITH MAGNA MITRAL EASE PERICARDIAL BIOPROSTHESIS 25 MM (N/A) Subjective: Feeling better  Objective: Vital signs in last 24 hours: Temp:  [98.6 F (37 C)] 98.6 F (37 C) (03/14 0449) Pulse Rate:  [87-95] 95 (03/14 0449) Cardiac Rhythm: Normal sinus rhythm (03/13 1900) Resp:  [18] 18 (03/14 0449) BP: (88-128)/(60-75) 128/75 (03/14 0449) SpO2:  [97 %] 97 % (03/14 0449) Weight:  [155 lb (70.3 kg)] 155 lb (70.3 kg) (03/14 0449)  Hemodynamic parameters for last 24 hours:    Intake/Output from previous day: 03/13 0701 - 03/14 0700 In: -  Out: 3200 [Urine:3200] Intake/Output this shift: No intake/output data recorded.  General appearance: alert, cooperative and no distress Heart: regular rate and rhythm Lungs: slightly dim in left base Abdomen: benign Extremities: LE edema is improved Wound: incis healing well  Lab Results: No results for input(s): WBC, HGB, HCT, PLT in the last 72 hours. BMET:  Recent Labs  03/03/17 0425 03/04/17 0353  NA 141 141  K 3.3* 3.5  CL 96* 98*  CO2 36* 33*  GLUCOSE 139* 145*  BUN 33* 30*  CREATININE 2.28* 2.44*  CALCIUM 8.8* 8.7*    PT/INR:  Recent Labs  03/04/17 0353  LABPROT 22.1*  INR 1.90   ABG    Component Value Date/Time   PHART 7.377 02/09/2017 1603   HCO3 23.0 02/09/2017 1603   TCO2 26 02/11/2017 1618   ACIDBASEDEF 2.0 02/09/2017 1603   O2SAT 61.0 03/04/2017 0500   CBG (last 3)   Recent Labs  03/03/17 1644 03/03/17 2039 03/04/17 0616  GLUCAP 164* 155* 151*    Meds Scheduled Meds: . acetaZOLAMIDE  250 mg Oral BID  . amiodarone  200 mg Oral BID  .  atorvastatin  80 mg Oral q1800  . feeding supplement (GLUCERNA SHAKE)  237 mL Oral TID BM  . furosemide  80 mg Intravenous BID  . hydrALAZINE  12.5 mg Oral TID  . insulin aspart  0-15 Units Subcutaneous TID WC  . isosorbide mononitrate  15 mg Oral Daily  . mouth rinse  15 mL Mouth Rinse q12n4p  . moving right along book   Does not apply Once  . pantoprazole  40 mg Oral Daily  . potassium chloride  60 mEq Oral TID  . Warfarin - Physician Dosing Inpatient   Does not apply q1800   Continuous Infusions: . milrinone 0.125 mcg/kg/min (03/03/17 0706)   PRN Meds:.diphenhydrAMINE, hydrALAZINE, ondansetron (ZOFRAN) IV, RESOURCE THICKENUP CLEAR, sodium chloride flush, traMADol  Xrays No results found.  Assessment/Plan: S/P Procedure(s) (LRB): CORONARY ARTERY BYPASS GRAFTING (CABG) x 4  LIMA-LAD SEQ SVG-DIAG-OM SVG-PD (N/A) TRANSESOPHAGEAL ECHOCARDIOGRAM (TEE) (N/A) MITRAL VALVE REPLACEMENT (MVR) WITH MAGNA MITRAL EASE PERICARDIAL BIOPROSTHESIS 25 MM (N/A)  1 conts to progress 2 Will give 2.5 mg coumadin today 3 CoOx 61- wean milrinone per AHF team, cont diuretics- creat up a little today, cont to replace K+, conts with excellent diuresis 4 cont D3 diet 5 plan for SNF later in week     LOS: 29 days    GOLD,WAYNE E 03/04/2017   Chart reviewed, patient examined, agree with above.

## 2017-03-04 NOTE — Progress Notes (Signed)
Physical Therapy Treatment Patient Details Name: [name] [name] MRN: [id number] DOB: 27-Sep-1935 Today's Date: 03/04/2017    History of Present Illness 82 y.o. female S/p emergent CABG x 4 and bioprosthetic MVR for LCX NSTEMI with flail MV on 2/15. PMH significant for DM and HTN.    PT Comments    Pt with increased fatigue this date but agreeable to PT. Pt ambulated without RW, using R hand held assist. Pt unsteady and con't to benefit from RW for safe ambulation. Con't to recommend SNF upon d/c.   Follow Up Recommendations  SNF;Supervision for mobility/OOB     Equipment Recommendations  Rolling walker with 5" wheels    Recommendations for Other Services       Precautions / Restrictions Precautions Precautions: Sternal;Fall Precaution Comments: recalled sternal precautions, v/c's to adhere funcitonally Restrictions Weight Bearing Restrictions: Yes Other Position/Activity Restrictions: sternal precautions    Mobility  Bed Mobility Overal bed mobility: Needs Assistance Bed Mobility: Supine to Sit     Supine to sit: Supervision     General bed mobility comments: instructed to log roll instead of pull self up to long sit  Transfers Overall transfer level: Needs assistance Equipment used: 1 person hand held assist Transfers: Sit to/from Stand Sit to Stand: Min assist         General transfer comment: minA due to first time not using RW  Ambulation/Gait Ambulation/Gait assistance: Min assist Ambulation Distance (Feet): 150 Feet Assistive device: 1 person hand held assist Gait Pattern/deviations: Step-through pattern;Decreased step length - right;Decreased step length - left;Decreased stride length;Trunk flexed;Wide base of support Gait velocity: dec Gait velocity interpretation: Below normal speed for age/gender General Gait Details: mildly unsteady, occasional cross over gait, reaching for hallway rail, minA to maintain balance without RW   Stairs             Wheelchair Mobility    Modified Rankin (Stroke Patients Only)       Balance Overall balance assessment: Needs assistance Sitting-balance support: Feet supported Sitting balance-Leahy Scale: Good     Standing balance support: No upper extremity supported;During functional activity Standing balance-Leahy Scale: Fair Standing balance comment: able to wash hands at sink with min guard without LOB                    Cognition Arousal/Alertness: Awake/alert Behavior During Therapy: WFL for tasks assessed/performed Overall Cognitive Status: Within Functional Limits for tasks assessed Area of Impairment: Memory     Memory: Decreased recall of precautions   Safety/Judgement: Decreased awareness of safety   Problem Solving: Slow processing;Difficulty sequencing;Requires verbal cues      Exercises      General Comments General comments (skin integrity, edema, etc.): minimal LE edema, pt taken to bathroom, pt min guard for hygiene for balance      Pertinent Vitals/Pain Pain Assessment: No/denies pain    Home Living                      Prior Function            PT Goals (current goals can now be found in the care plan section) Acute Rehab PT Goals Patient Stated Goal: go see husband Progress towards PT goals: Progressing toward goals    Frequency    Min 3X/week      PT Plan Current plan remains appropriate    Co-evaluation             End of Session Equipment  Utilized During Treatment: Gait belt Activity Tolerance: Patient tolerated treatment well Patient left: in chair;with call bell/phone within reach;with family/visitor present Nurse Communication: Mobility status PT Visit Diagnosis: Unsteadiness on feet (R26.81);Other abnormalities of gait and mobility (R26.89);Muscle weakness (generalized) (M62.81);Difficulty in walking, not elsewhere classified (R26.2)     Time: 7308-5694 PT Time Calculation (min) (ACUTE ONLY): 16  min  Charges:  $Gait Training: 8-22 mins                    G Codes:       Teyla Skidgel M Cerrone Debold 29-Mar-2017, 3:45 PM   Kittie Plater, PT, DPT Pager #: [phone] Office #: [phone]

## 2017-03-04 NOTE — Progress Notes (Signed)
CARDIAC REHAB PHASE I   PRE:  Rate/Rhythm: 87 SR  BP:  Sitting: 105/67        SaO2: 98 RA  MODE:  Ambulation: 150 ft   POST:  Rate/Rhythm: 105 ST  BP:  Sitting: 140/79         SaO2: 96 RA  Pt in bed, states she has not been up yet today, agreeable to walk. Pt ambulated 150 ft on RA, IV, rolling walker, assist x1, steady gait, tolerated well. Pt c/o mild DOE (states it is much improved), denies any other complaints, declined rest stop. Encouraged IS, additional ambulation x2 today. Pt to recliner then to bedside commode, then to recliner again after walk, feet elevated, call bell within reach. Will follow.    [id number] Lenna Sciara, RN, BSN 03/04/2017 9:59 AM

## 2017-03-04 NOTE — Progress Notes (Signed)
Patient ID: [name] [name], female   DOB: 21-Dec-1935, 82 y.o.   MRN: [id number]     Advanced Heart Failure Rounding Note  PCP: Marden Noble, MD Primary Cardiologist: Dr. Yvone Neu [facility]) HF: Dr. Haroldine Laws   Subjective:    s/p emergent CABG x 4 and bioprosthetic MVR for LCX NSTEMI with flail MV on 2/15.  Post-op Echo with EF 40% RV normal, and stable MVR.  ECHO 3/4 EF 20-25% RV mild-moderately reduced. Trivial pericardial effusion.   ENT consulted 3/5 due to hoarseness. + L vocal cord paralysis.   CO-OX dropped to 51, so milrinone was restarted on 3/5. Milrinone dropped to 0.25 on 3/12. Co-ox 61% today on milrinone 0.125   Yesterday, hydralazine 12.5mg  TID and Imdur 15mg  started for LV dysfunction. Diuresed with 80mg  IV Lasix BID + diamox 250mg . Weight down to 155 lbs (below admission weight).   Feels good today, denies orthopnea. She is up and eating breakfast. Plan is for her to go [facility].     Objective:   Weight Range: 155 lb (70.3 kg) Body mass index is 25.02 kg/m.   Vital Signs:   Temp:  [98.6 F (37 C)] 98.6 F (37 C) (03/14 0449) Pulse Rate:  [87-95] 95 (03/14 0449) Resp:  [18] 18 (03/14 0449) BP: (88-128)/(60-75) 128/75 (03/14 0449) SpO2:  [97 %] 97 % (03/14 0449) Weight:  [155 lb (70.3 kg)] 155 lb (70.3 kg) (03/14 0449) Last BM Date: 03/02/17  Weight change: Filed Weights   03/02/17 0620 03/03/17 0611 03/04/17 0449  Weight: 165 lb 9.6 oz (75.1 kg) 159 lb 4.8 oz (72.3 kg) 155 lb (70.3 kg)    Intake/Output:   Intake/Output Summary (Last 24 hours) at 03/04/17 0750 Last data filed at 03/04/17 0100  Gross per 24 hour  Intake                0 ml  Output             3200 ml  Net            -3200 ml     Physical Exam: General: Sitting up in bed, eating breakfast.     HEENT: Hoarse voice Neck: Supple, JVP 6-7cm. 2+ bilat; no bruits. No thyromegaly or nodule noted.  Cor: PMI nondisplaced. Irregular. No M/G/R noted. Mital valve click  Lungs: CTA  throughout.  Abdomen: soft, NT, ND, no HSM. No bruits or masses. +BS  Extremities: no cyanosis, clubbing, rash. RUE PICC. 1+ bilateral pretibial edema. TED hose  Neuro: A&Ox3 follows commands and is oriented. Moves all 4 extremities.   Telemetry: Personally reviewed, NSR   Labs: Basic Metabolic Panel  Recent Labs  03/03/17 0425 03/04/17 0353  NA 141 141  K 3.3* 3.5  CL 96* 98*  CO2 36* 33*  GLUCOSE 139* 145*  BUN 33* 30*  CREATININE 2.28* 2.44*  CALCIUM 8.8* 8.7*  MG 1.7  --    Liver Function Tests  Recent Labs  03/02/17 0354 03/03/17 0425  AST 37 36  ALT 26 28  ALKPHOS 94 96  BILITOT 2.5* 2.6*  PROT 4.6* 4.8*  ALBUMIN 2.4* 2.5*    Imaging/Studies: Transthoracic Echocardiography 02/22/17 Study Conclusions  - Left ventricle: The cavity size was moderately dilated. Wall   thickness was normal. Systolic function was severely reduced. The   estimated ejection fraction was in the range of 20% to 25%.   Akinesis of the basal-midlateral and inferior myocardium. - Mitral valve: A bioprosthesis was present. There was  mild   regurgitation. - Left atrium: The atrium was dilated. - Right ventricle: Systolic function was mildly to moderately   reduced. - Tricuspid valve: There was moderate regurgitation. - Pulmonary arteries: Systolic pressure was moderately increased.   PA peak pressure: 51 mm Hg (S). - Pericardium, extracardiac: A trivial pericardial effusion was   identified. There was a left pleural effusion.    Medications:     Scheduled Medications: . acetaZOLAMIDE  250 mg Oral BID  . amiodarone  200 mg Oral BID  . atorvastatin  80 mg Oral q1800  . feeding supplement (GLUCERNA SHAKE)  237 mL Oral TID BM  . furosemide  80 mg Intravenous BID  . hydrALAZINE  12.5 mg Oral TID  . insulin aspart  0-15 Units Subcutaneous TID WC  . isosorbide mononitrate  15 mg Oral Daily  . mouth rinse  15 mL Mouth Rinse q12n4p  . moving right along book   Does not apply Once   . pantoprazole  40 mg Oral Daily  . potassium chloride  60 mEq Oral TID  . warfarin  2.5 mg Oral ONCE-1800  . Warfarin - Physician Dosing Inpatient   Does not apply q1800    Infusions: . milrinone 0.125 mcg/kg/min (03/03/17 0706)    PRN Medications: diphenhydrAMINE, hydrALAZINE, ondansetron (ZOFRAN) IV, RESOURCE THICKENUP CLEAR, sodium chloride flush, traMADol   Assessment/Plan   1. NSTEMI with CAD [name] on 2/15 2. Severe MR with flail segment s/p MVR c/ bioprosthetic valve. 3. Cardiogenic shock: Echo pre-op with EF 40-45%, lateral akinesis, severe MR.     --Echo 2/22 EF 40% MVR ok. Echo 3/5 EF 20-25% 4. AKI 5. Shock Liver 6. PAF 7. Hypokalemia 8. L vocal cord paralysis  Co-ox 61 this am on milrinone 0.125 mcg. Weight down to 155 pounds, pre op weight was 158 lbs. Volume status much improved. Can likely turn off milrinone today. Transition to po Lasix 40mg  BID today.   On po amio. Maintaining NSR. INR 1.90, pharmacy dosing.  K is 3.5 - will get supplement today.   Arbutus Leas, NP  7:50 AM Advanced Heart Failure Team Pager [phone] (M-F; 7a - 4p)  Please contact [facility] for night-coverage after hours (4p -7a ) and weekends on [email or web address]  Patient seen and examined with Jettie Booze, NP. We discussed all aspects of the encounter. I agree with the assessment and plan as stated above.   Volume status much improved. Renal functions slightly worse.  Co-ox stable. Will stop milrinone. Change to po diuretics. If co-ox and volume status ok may be able to go to SNF in next day or two.   Maintaining NSR on amio. Will continue. Adjust warfarin.   Supp K.  Drina Jobst,MD 9:00 AM

## 2017-03-05 LAB — BASIC METABOLIC PANEL
Anion gap: 7 (ref 5–15)
BUN: 28 mg/dL — AB (ref 6–20)
CO2: 28 mmol/L (ref 22–32)
Calcium: 8.8 mg/dL — ABNORMAL LOW (ref 8.9–10.3)
Chloride: 107 mmol/L (ref 101–111)
Creatinine, Ser: 2.3 mg/dL — ABNORMAL HIGH (ref 0.44–1.00)
GFR calc Af Amer: 22 mL/min — ABNORMAL LOW (ref >60)
GFR, EST NON AFRICAN AMERICAN: 19 mL/min — AB (ref >60)
Glucose, Bld: 132 mg/dL — ABNORMAL HIGH (ref 65–99)
Potassium: 4.1 mmol/L (ref 3.5–5.1)
SODIUM: 142 mmol/L (ref 135–145)

## 2017-03-05 LAB — PROTIME-INR
INR: 2.26
Prothrombin Time: 25.3 seconds — ABNORMAL HIGH (ref 11.4–15.2)

## 2017-03-05 LAB — GLUCOSE, CAPILLARY
GLUCOSE-CAPILLARY: 127 mg/dL — AB (ref 65–99)
GLUCOSE-CAPILLARY: 141 mg/dL — AB (ref 65–99)
Glucose-Capillary: 119 mg/dL — ABNORMAL HIGH (ref 65–99)

## 2017-03-05 LAB — COOXEMETRY PANEL
Carboxyhemoglobin: 1.7 % — ABNORMAL HIGH (ref 0.5–1.5)
METHEMOGLOBIN: 0.8 % (ref 0.0–1.5)
O2 Saturation: 74.5 %
TOTAL HEMOGLOBIN: 9.3 g/dL — AB (ref 12.0–16.0)

## 2017-03-05 MED ORDER — WARFARIN SODIUM 2.5 MG PO TABS
2.5000 mg | ORAL_TABLET | Freq: Once | ORAL | Status: DC
  Filled 2017-03-05: qty 1

## 2017-03-05 MED ORDER — POTASSIUM CHLORIDE CRYS ER 20 MEQ PO TBCR
40.0000 meq | EXTENDED_RELEASE_TABLET | Freq: Two times a day (BID) | ORAL | Status: DC
  Administered 2017-03-05: 40 meq via ORAL
  Filled 2017-03-05: qty 2

## 2017-03-05 MED ORDER — ATORVASTATIN CALCIUM 80 MG PO TABS: 80.0000 mg | ORAL_TABLET | Freq: Every day | ORAL | Status: DC

## 2017-03-05 MED ORDER — FUROSEMIDE 40 MG PO TABS: 40.0000 mg | ORAL_TABLET | Freq: Two times a day (BID) | ORAL | Status: DC

## 2017-03-05 MED ORDER — WARFARIN SODIUM 1 MG PO TABS: 2.0000 mg | ORAL_TABLET | Freq: Every day | ORAL | Status: DC

## 2017-03-05 MED ORDER — WARFARIN SODIUM 2.5 MG PO TABS
2.5000 mg | ORAL_TABLET | Freq: Once | ORAL | Status: AC
  Administered 2017-03-05: 2.5 mg via ORAL

## 2017-03-05 MED ORDER — AMIODARONE HCL 200 MG PO TABS: 200.0000 mg | ORAL_TABLET | Freq: Two times a day (BID) | ORAL | Status: DC

## 2017-03-05 MED ORDER — POTASSIUM CHLORIDE CRYS ER 20 MEQ PO TBCR: 40.0000 meq | EXTENDED_RELEASE_TABLET | Freq: Two times a day (BID) | ORAL | Status: DC

## 2017-03-05 MED ORDER — HYDRALAZINE HCL 25 MG PO TABS: 25.0000 mg | ORAL_TABLET | Freq: Three times a day (TID) | ORAL | Status: DC

## 2017-03-05 MED ORDER — TRAMADOL HCL 50 MG PO TABS: 50.0000 mg | ORAL_TABLET | Freq: Two times a day (BID) | ORAL | 0 refills | Status: DC | PRN

## 2017-03-05 MED ORDER — ISOSORBIDE MONONITRATE ER 30 MG PO TB24: 30.0000 mg | ORAL_TABLET | Freq: Every day | ORAL | Status: DC

## 2017-03-05 NOTE — Progress Notes (Signed)
Clinical Social Worker facilitated patient discharge including contacting patient family and facility to confirm patient discharge plans.  Clinical information faxed to facility and family agreeable with plan.  CSW arranged ambulance transport via [facility] to [facility] .  RN Katharine Look to call [phone] (patient will go to room 208A)report prior to discharge.  Clinical Social Worker will sign off for now as social work intervention is no longer needed. Please consult [place] again if new need arises.  Rhea Pink, MSW, [place]

## 2017-03-05 NOTE — Care Management Note (Signed)
Case Management Note Previous CM note initiated by Girard Cooter, RN 02/16/2017, 9:47 AM   Patient Details  Name: [name] [name] MRN: [id number] Date of Birth: 1935-04-04  Subjective/Objective:    Pt lives with and is primary caregiver for husband who has dementia and is wheelchair bound.  Pt will need ST-SNF for rehab, requests [facility], CSW is following.      Addendum 3/9 [name] [name] RNCM Patient has bed at [facility] where husband currently is per CSW. Milrinone Gtt (plan for SNF), per bedside RN pleural effusion worsening and pt may require pleurocentesis, continues IV Lasix.                      Action/Plan: 03/05/17- [name] Gibbons RN CM- Pt s/p CABGx4 and MVR with post op afib and HF- now off IV milrinone and stable for d/c to SNF- CSW following for d/c plans to [facility].    Expected Discharge Date:  03/05/17               Expected Discharge Plan:  [place]  In-House Referral:  Clinical Social Work  Discharge planning Services  CM Consult  Post Acute Care Choice:    Choice offered to:     DME Arranged:    DME Agency:     HH Arranged:    [place] Agency:     Status of Service:  Completed, signed off  If discussed at [employer] of Stay Meetings, dates discussed:  3/13, 3/15  Discharge Disposition: skilled facility   Additional Comments:  03/05/17- 81- [name] Armas RN, CM- pt stable for d/c today - CSW following for placement needs  [name] Patricia, RN 03/05/2017, 11:06 AM [phone]

## 2017-03-05 NOTE — NC FL2 (Signed)
[place] [name] SCREENING TOOL     IDENTIFICATION  Patient Name: [name] [name] Birthdate: 1935/05/26 Sex: female Admission Date (Current Location): 02/03/2017  [facility] [name] Number:  [name]:  [facility], [place] [address], [place], [place] [address]      Provider Number: [id number]  Attending Physician Name and Address:  Gaye Pollack, MD  Relative Name and Phone Number:       Current [name]: SNF Recommended [name]: [place] Prior Approval Number:    Date Approved/Denied:   PASRR Number: [id number]  Discharge Plan: SNF    Current Diagnoses: Patient Active Problem List   Diagnosis Date Noted  . [name]   . [name] 02/05/2017  . Acute hypoxemic respiratory failure ([place])   . Cardiogenic shock ([place])   . Unstable angina ([place]) 02/03/2017  . [name] 02/03/2017  . Murmur 02/03/2017  . AKI (acute kidney injury) ([place]) 02/03/2017  . Type 2 diabetes mellitus with complication ([place]) 36/62/9476  . Essential hypertension 02/03/2017  . Absolute anemia 09/19/2014  . Encounter for screening colonoscopy 09/19/2014    Orientation RESPIRATION BLADDER Height & Weight     Self, Time  O2 Indwelling catheter Weight: 152 lb 6.4 oz (69.1 kg) Height:  5\' 6"  (167.6 cm)  BEHAVIORAL SYMPTOMS/MOOD NEUROLOGICAL BOWEL NUTRITION STATUS      Continent [name]  [name] Skin   Extensive Assist Verbally                         Personal Care Assistance Level of Assistance  Bathing, Feeding, Dressing Bathing Assistance: Limited assistance Feeding assistance: Independent Dressing Assistance: Limited assistance     Functional Limitations Info  Sight, Hearing, Speech Sight Info: Adequate Hearing Info: Adequate Speech Info: Adequate    SPECIAL CARE FACTORS FREQUENCY  [name], OT (By  licensed OT)     PT Frequency: 5x week OT Frequency: 5x week            Contractures Contractures Info: Not present    Additional Factors Info                  Current Medications (03/05/2017):  [name] Current Facility-Administered Medications  Medication Dose Route Frequency Provider Last Rate Last Dose  . amiodarone (PACERONE) tablet 200 mg  200 mg Oral BID Wilder Glade Gold, PA-C   200 mg at 03/05/17 0931  . atorvastatin (LIPITOR) tablet 80 mg  80 mg Oral q1800 Satira Mccallum Tillery, PA-C   80 mg at 03/04/17 1728  . diphenhydrAMINE (BENADRYL) capsule 25 mg  25 mg Oral QHS PRN John Giovanni, PA-C   25 mg at 02/26/17 2115  . feeding supplement (GLUCERNA SHAKE) (GLUCERNA SHAKE) liquid 237 mL  237 mL Oral TID BM Gaye Pollack, MD   237 mL at 03/04/17 1400  . furosemide (LASIX) tablet 40 mg  40 mg Oral BID Satira Mccallum Tillery, PA-C   40 mg at 03/05/17 0931  . hydrALAZINE (APRESOLINE) injection 10 mg  10 mg Intravenous Q4H PRN Jolaine Artist, MD   10 mg at 02/10/17 1553  . hydrALAZINE (APRESOLINE) tablet 12.5 mg  12.5 mg Oral TID Gaye Pollack, MD   12.5 mg at 03/05/17 0932  . insulin aspart (novoLOG) injection 0-15  Units  0-15 Units Subcutaneous TID WC Gaye Pollack, MD   2 Units at 03/05/17 0703  . isosorbide mononitrate (IMDUR) 24 hr tablet 15 mg  15 mg Oral Daily Gaye Pollack, MD   15 mg at 03/05/17 0932  . MEDLINE mouth rinse  15 mL Mouth Rinse q12n4p Gaye Pollack, MD   15 mL at 03/01/17 1600  . moving right along book   Does not apply Once John Giovanni, PA-C      . ondansetron [facility]) injection 4 mg  4 mg Intravenous Q6H PRN John Giovanni, PA-C   4 mg at 02/19/17 0154  . pantoprazole (PROTONIX) EC tablet 40 mg  40 mg Oral Daily Gaye Pollack, MD   40 mg at 03/05/17 0931  . potassium chloride SA (K-DUR,KLOR-CON) CR tablet 40 mEq  40 mEq Oral BID Arbutus Leas, NP   40 mEq at 03/05/17 0932  . RESOURCE THICKENUP CLEAR   Oral  PRN Gaye Pollack, MD      . sodium chloride flush (NS) 0.9 % injection 10-40 mL  10-40 mL Intracatheter PRN Gaye Pollack, MD   20 mL at 03/05/17 0458  . traMADol (ULTRAM) tablet 50-100 mg  50-100 mg Oral Q4H PRN John Giovanni, PA-C      . Warfarin - Physician Dosing Inpatient   Does not apply [id number] Gaye Pollack, MD         Discharge Medications: Please see discharge summary for a list of discharge medications.  Relevant Imaging Results:  Relevant Lab Results:   Additional Information [id number]  Wende Neighbors, LCSW

## 2017-03-05 NOTE — Progress Notes (Addendum)
[address]       [place] [address]             [phone]      28 Days Post-Op Procedure(s) (LRB): CORONARY ARTERY BYPASS GRAFTING (CABG) x 4                                                                               LIMA-LAD SEQ SVG-DIAG-OM SVG-PD (N/A) TRANSESOPHAGEAL ECHOCARDIOGRAM (TEE) (N/A) MITRAL VALVE REPLACEMENT (MVR) WITH MAGNA MITRAL EASE PERICARDIAL BIOPROSTHESIS 25 MM (N/A) Subjective: Feels well  Objective: Vital signs in last 24 hours: Temp:  [98.4 F (36.9 C)-98.8 F (37.1 C)] 98.8 F (37.1 C) (03/15 0650) Pulse Rate:  [90-94] 90 (03/15 0650) Cardiac Rhythm: Normal sinus rhythm (03/14 1900) Resp:  [18] 18 (03/15 0650) BP: (124-134)/(74-75) 134/75 (03/15 0650) SpO2:  [98 %-100 %] 99 % (03/15 0650) Weight:  [152 lb 6.4 oz (69.1 kg)] 152 lb 6.4 oz (69.1 kg) (03/15 0650)  Hemodynamic parameters for last 24 hours:    Intake/Output from previous day: 03/14 0701 - 03/15 0700 In: 720 [P.O.:720] Out: 500 [Urine:500] Intake/Output this shift: No intake/output data recorded.  General appearance: alert, cooperative and no distress Heart: regular rate and rhythm Lungs: slightly dim in left base Abdomen: benign Extremities: edema conts to improve Wound: incis healing well  Lab Results: No results for input(s): WBC, HGB, HCT, PLT in the last 72 hours. BMET:  Recent Labs  03/04/17 0353 03/05/17 0449  NA 141 142  K 3.5 4.1  CL 98* 107  CO2 33* 28  GLUCOSE 145* 132*  BUN 30* 28*  CREATININE 2.44* 2.30*  CALCIUM 8.7* 8.8*    PT/INR:  Recent Labs  03/05/17 0449  LABPROT 25.3*  INR 2.26   ABG    Component Value Date/Time   PHART 7.377 02/09/2017 1603   HCO3 23.0 02/09/2017 1603   TCO2 26 02/11/2017 1618   ACIDBASEDEF 2.0 02/09/2017 1603   O2SAT 74.5 03/05/2017 0455   CBG (last 3)   Recent Labs  03/04/17 1647 03/04/17 2107 03/05/17 0657  GLUCAP 164* 138* 127*    Meds Scheduled Meds: . acetaZOLAMIDE  250  mg Oral BID  . amiodarone  200 mg Oral BID  . atorvastatin  80 mg Oral q1800  . feeding supplement (GLUCERNA SHAKE)  237 mL Oral TID BM  . furosemide  40 mg Oral BID  . hydrALAZINE  12.5 mg Oral TID  . insulin aspart  0-15 Units Subcutaneous TID WC  . isosorbide mononitrate  15 mg Oral Daily  . mouth rinse  15 mL Mouth Rinse q12n4p  . moving right along book   Does not apply Once  . pantoprazole  40 mg Oral Daily  . potassium chloride  60 mEq Oral TID  . Warfarin - Physician Dosing Inpatient   Does not apply q1800   Continuous Infusions: PRN Meds:.diphenhydrAMINE, hydrALAZINE, ondansetron (ZOFRAN) IV, RESOURCE THICKENUP CLEAR, sodium chloride flush, traMADol  Xrays No results found.  Assessment/Plan: S/P Procedure(s) (LRB): CORONARY ARTERY BYPASS GRAFTING (CABG) x 4  LIMA-LAD SEQ SVG-DIAG-OM SVG-PD (N/A) TRANSESOPHAGEAL ECHOCARDIOGRAM (TEE) (N/A) MITRAL VALVE REPLACEMENT (MVR) WITH MAGNA MITRAL EASE PERICARDIAL BIOPROSTHESIS 25 MM (N/A)  1 conts to do well 2 off milrinone with CO-Ox 75 if accurate 3 sugars adeq controlled 4 creat improved, conts with excellent diuresis 5 INR 2.2 - cont current dose for now 6 SNF soon   LOS: 30 days    GOLD,WAYNE E 03/05/2017   Chart reviewed, patient examined, agree with above. Co-ox is good off Milrinone.  I/O even yesterday but wt down 2.5 lbs. She still has some pitting edema in lower legs and will need to remain on diuretics. Renal function stable.  INR 2.26 I think she is medically stable to go to SNF. She will probably require about 2 mg Coumadin per day with INR check early next week.  AHF team will decide what heart failure meds to send her out on.

## 2017-03-05 NOTE — Progress Notes (Signed)
Pt given discharge instructions, medication lists, follow up appointments, and when to call the doctor.  Pt verbalizes understanding. [facility] and gave report to night shift RN.  All questions answered. RN aware of pm medications due and sternal precautions. Payton Emerald, RN

## 2017-03-05 NOTE — Progress Notes (Signed)
Patient ID: [name] [name], female   DOB: February 13, 1935, 82 y.o.   MRN: [id number]     Advanced Heart Failure Rounding Note  PCP: Marden Noble, MD Primary Cardiologist: Dr. Yvone Neu [facility]) HF: Dr. Haroldine Laws   Subjective:    s/p emergent CABG x 4 and bioprosthetic MVR for LCX NSTEMI with flail MV on 2/15.  Post-op Echo with EF 40% RV normal, and stable MVR.  ECHO 3/4 EF 20-25% RV mild-moderately reduced. Trivial pericardial effusion.   ENT consulted 3/5 due to hoarseness. + L vocal cord paralysis.   CO-OX dropped to 51, so milrinone was restarted on 3/5. Milrinone stopped 03/04/17.  Co-ox 74% today off milrinone.   Yesterday, lasix transitioned to po 40mg  BID. Weight down 3 pounds. Below admission weight of 158 pounds.   Says she feels great today. She is continuing to progress in her activity level.   Objective:   Weight Range: 152 lb 6.4 oz (69.1 kg) Body mass index is 24.6 kg/m.   Vital Signs:   Temp:  [98.4 F (36.9 C)-98.8 F (37.1 C)] 98.8 F (37.1 C) (03/15 0650) Pulse Rate:  [90-94] 90 (03/15 0650) Resp:  [18] 18 (03/15 0650) BP: (124-134)/(74-75) 134/75 (03/15 0650) SpO2:  [98 %-100 %] 99 % (03/15 0650) Weight:  [152 lb 6.4 oz (69.1 kg)] 152 lb 6.4 oz (69.1 kg) (03/15 0650) Last BM Date: 03/04/17  Weight change: Filed Weights   03/03/17 0611 03/04/17 0449 03/05/17 0650  Weight: 159 lb 4.8 oz (72.3 kg) 155 lb (70.3 kg) 152 lb 6.4 oz (69.1 kg)    Intake/Output:   Intake/Output Summary (Last 24 hours) at 03/05/17 0753 Last data filed at 03/04/17 1700  Gross per 24 hour  Intake              720 ml  Output              500 ml  Net              220 ml     Physical Exam: General: Elderly female. NAD. Resting comfortably.     HEENT: Voice remains hoarse.  Neck: Supple,no JVP 2+ bilat; no bruits. No thyromegaly or nodule noted.  Cor: PMI nondisplaced. Reg. Rate and rhythm. No murmurs. noted. Mital valve click appreciated.  Lungs: CTA throughout.    Abdomen: soft, NT, ND, no HSM. No bruits or masses. + Bowel sounds in all 4 quadrants.  Extremities: no cyanosis, clubbing, rash. RUE PICC. Trace pedal edema.   Neuro: A&Ox3 follows commands and is oriented. Moves all 4 extremities.   Telemetry: Personally reviewed, NSR   Labs: Basic Metabolic Panel  Recent Labs  03/03/17 0425 03/04/17 0353 03/05/17 0449  NA 141 141 142  K 3.3* 3.5 4.1  CL 96* 98* 107  CO2 36* 33* 28  GLUCOSE 139* 145* 132*  BUN 33* 30* 28*  CREATININE 2.28* 2.44* 2.30*  CALCIUM 8.8* 8.7* 8.8*  MG 1.7  --   --    Liver Function Tests  Recent Labs  03/03/17 0425  AST 36  ALT 28  ALKPHOS 96  BILITOT 2.6*  PROT 4.8*  ALBUMIN 2.5*    Imaging/Studies: Transthoracic Echocardiography 02/22/17 Study Conclusions  - Left ventricle: The cavity size was moderately dilated. Wall   thickness was normal. Systolic function was severely reduced. The   estimated ejection fraction was in the range of 20% to 25%.   Akinesis of the basal-midlateral and inferior myocardium. - Mitral valve: A bioprosthesis  was present. There was mild   regurgitation. - Left atrium: The atrium was dilated. - Right ventricle: Systolic function was mildly to moderately   reduced. - Tricuspid valve: There was moderate regurgitation. - Pulmonary arteries: Systolic pressure was moderately increased.   PA peak pressure: 51 mm Hg (S). - Pericardium, extracardiac: A trivial pericardial effusion was   identified. There was a left pleural effusion.    Medications:     Scheduled Medications: . acetaZOLAMIDE  250 mg Oral BID  . amiodarone  200 mg Oral BID  . atorvastatin  80 mg Oral q1800  . feeding supplement (GLUCERNA SHAKE)  237 mL Oral TID BM  . furosemide  40 mg Oral BID  . hydrALAZINE  12.5 mg Oral TID  . insulin aspart  0-15 Units Subcutaneous TID WC  . isosorbide mononitrate  15 mg Oral Daily  . mouth rinse  15 mL Mouth Rinse q12n4p  . moving right along book   Does not  apply Once  . pantoprazole  40 mg Oral Daily  . potassium chloride  60 mEq Oral TID  . Warfarin - Physician Dosing Inpatient   Does not apply q1800    Infusions:   PRN Medications: diphenhydrAMINE, hydrALAZINE, ondansetron (ZOFRAN) IV, RESOURCE THICKENUP CLEAR, sodium chloride flush, traMADol   Assessment/Plan   1. NSTEMI with CAD [name] on 2/15 2. Severe MR with flail segment s/p MVR c/ bioprosthetic valve. 3. Cardiogenic shock: Echo pre-op with EF 40-45%, lateral akinesis, severe MR.     --Echo 2/22 EF 40% MVR ok. Echo 3/5 EF 20-25% 4. AKI 5. Shock Liver 6. PAF 7. Hypokalemia 8. L vocal cord paralysis  Co-ox 74 this am off milrinone. Weight down to 152 pounds, pre op weight was 158 lbs. Volume status much improved. Can likely turn off milrinone today. Continue Lasix 40mg  BID. Discontinue diamox, CO2 is 28. No beta blocker for now. Can add outpatient, as well as consider adding spiro.   On po amio. Maintaining NSR. INR 2.26, pharmacy dosing.  K is 4.1, will reduce supplement as her diuretic was decreased.  Will start 96mEq BID.   Meds for discharge:   Amio 200mg  BID x 7 days, then 200mg  daily Hydralazine 25 mg TID Imdur 30mg  daily Lasix 40mg  BID Potassium 40Meq BID.  Warfarin Atorva 80 daily  F/u HF Clinic Dr. Haroldine Laws 3/22 @ [place], NP  7:53 AM Advanced Heart Failure Team Pager [phone] (M-F; [place])  Please contact [facility] for night-coverage after hours (4p -7a ) and weekends on [email or web address]  Patient seen and examined with Jettie Booze, NP. We discussed all aspects of the encounter. I agree with the assessment and plan as stated above.   She looks good. She is ready for d/c today to [facility] on above medications. I will see her next week and adjust diuretics as needed. No b-blocker yet given low output in hopsital.   Bensimhon, Daniel,MD 8:50 AM

## 2017-03-05 NOTE — Progress Notes (Signed)
1100-1130 Education completed with pt who voiced understanding. Reviewed sternal precautions, IS and flutter valve use, ex ed, heart healthy food choices watching carbs and sodium. Gave CHF booklet and encouraged pt to weigh daily. Discussed CRP 2 and will refer to [facility] program. Graylon Good RN BSN 03/05/2017 11:29 AM

## 2017-03-05 NOTE — Care Management Important Message (Signed)
Important Message  Patient Details  Name: [name] [name] MRN: [id number] Date of Birth: [birth year]   Medicare Important Message Given:  Yes    Nathen May 03/05/2017, 12:54 PM

## 2017-03-05 NOTE — Progress Notes (Signed)
  Speech Language Pathology Treatment: Dysphagia  Patient Details Name: [name] [name] MRN: [id number] DOB: 1935-07-04 Today's Date: 03/05/2017 Time: 8832-5498 SLP Time Calculation (min) (ACUTE ONLY): 11 min  Assessment / Plan / Recommendation Clinical Impression  Pt is scheduled for D/C to SNF today.  Assessed at bedside with thin and nectar-thick liquids, consumption of which continues to elicit consistent cough.  Last MBS was 3/7 with little improvement in swallow due to primarily to left vocal fold paralysis. Recommend continued dysphagia 3 with honey-thick liquids.  Per ENT's recs, pt will need f/u ENT appt as OP; she will also need SNF SLP and repeat instrumental swallow study.  Discussed with pt and gave written recommendations for f/u.  She agrees/understands.   HPI HPI: [name] [name] is a 82 year old woman with history of type 2 diabetes mellitus and hypertension, who presented to cardiology clinic due to acute chest pain that began 3 days ago while doing light housework. EKG showed abnormalities concerning for ischemia. Urgent cardiac cath revealed three-vessel CAD, including serial 70 and 80% proximal/mid LAD stenoses. Underwent CABG x 4 2/15 and extubated 2/19. CXR No pneumothorax or pulmonary edema. Left lung base atelectasis and possible small pleural effusion. Previous MBS on 02/13/2017-recommended Dys 1, honey thick. Evaluated by ENT on 3/2, dx left vocal cord paralysis in the paramedian position, resulting in a glottic gap. FEES on 02/24/2017 for potential liberalization of diet, however could not visualize laryngeal/pharyngeal area leading to order for repeat MBS.       SLP Plan  Discharge SLP treatment due to (comment)       Recommendations  Diet recommendations: Dysphagia 3 (mechanical soft);Honey-thick liquid Liquids provided via: Cup Medication Administration: Crushed with puree Supervision: Patient able to self feed;Intermittent supervision to cue for compensatory  strategies Compensations: Slow rate;Small sips/bites;Multiple dry swallows after each bite/sip Postural Changes and/or Swallow Maneuvers: Seated upright 90 degrees                Oral Care Recommendations: Oral care BID Follow up Recommendations: Skilled Nursing facility SLP Visit Diagnosis: Dysphagia, pharyngeal phase (R13.13) Plan: Discharge SLP treatment due to (comment)       GO                [name] [name] 03/05/2017, 1:50 PM

## 2017-03-05 NOTE — Progress Notes (Signed)
RUA PICC d/c'ed per order.  SIte wnl, cleaned with chg, covered with vaseline guaze and dry 2x2.  Pt verbalizes understanding of site care, signs of infection, interventions for bleeding and when to call the doctor- via teachback method.

## 2017-03-06 ENCOUNTER — Other Ambulatory Visit
Admission: RE | Admit: 2017-03-06 | Discharge: 2017-03-06 | Disposition: A | Discharge status: Home / Self Care | Payer: No Typology Code available for payment source | Source: Skilled Nursing Facility | Attending: Internal Medicine | Admitting: Internal Medicine

## 2017-03-06 DIAGNOSIS — I480 Paroxysmal atrial fibrillation: Secondary | ICD-10-CM | POA: Insufficient documentation

## 2017-03-06 LAB — PROTIME-INR
INR: 3.14
PROTHROMBIN TIME: 33 s — AB (ref 11.4–15.2)

## 2017-03-07 LAB — GLUCOSE, CAPILLARY
GLUCOSE-CAPILLARY: 136 mg/dL — AB (ref 65–99)
Glucose-Capillary: 124 mg/dL — ABNORMAL HIGH (ref 65–99)
Glucose-Capillary: 190 mg/dL — ABNORMAL HIGH (ref 65–99)
Glucose-Capillary: 161 mg/dL — ABNORMAL HIGH (ref 65–99)

## 2017-03-09 ENCOUNTER — Other Ambulatory Visit
Admission: RE | Admit: 2017-03-09 | Discharge: 2017-03-09 | Disposition: A | Discharge status: Home / Self Care | Payer: No Typology Code available for payment source | Source: Ambulatory Visit | Attending: Internal Medicine | Admitting: Internal Medicine

## 2017-03-09 DIAGNOSIS — Z952 Presence of prosthetic heart valve: Secondary | ICD-10-CM | POA: Insufficient documentation

## 2017-03-09 LAB — GLUCOSE, CAPILLARY
GLUCOSE-CAPILLARY: 212 mg/dL — AB (ref 65–99)
GLUCOSE-CAPILLARY: 180 mg/dL — AB (ref 65–99)
GLUCOSE-CAPILLARY: 196 mg/dL — AB (ref 65–99)
Glucose-Capillary: 133 mg/dL — ABNORMAL HIGH (ref 65–99)
Glucose-Capillary: 139 mg/dL — ABNORMAL HIGH (ref 65–99)
Glucose-Capillary: 133 mg/dL — ABNORMAL HIGH (ref 65–99)
Glucose-Capillary: 230 mg/dL — ABNORMAL HIGH (ref 65–99)
Glucose-Capillary: 161 mg/dL — ABNORMAL HIGH (ref 65–99)
Glucose-Capillary: 158 mg/dL — ABNORMAL HIGH (ref 65–99)

## 2017-03-09 LAB — PROTIME-INR
INR: 4.5 — AB
Prothrombin Time: 44 seconds — ABNORMAL HIGH (ref 11.4–15.2)

## 2017-03-10 LAB — GLUCOSE, CAPILLARY
GLUCOSE-CAPILLARY: 118 mg/dL — AB (ref 65–99)
Glucose-Capillary: 120 mg/dL — ABNORMAL HIGH (ref 65–99)
Glucose-Capillary: 149 mg/dL — ABNORMAL HIGH (ref 65–99)
Glucose-Capillary: 225 mg/dL — ABNORMAL HIGH (ref 65–99)
Glucose-Capillary: 112 mg/dL — ABNORMAL HIGH (ref 65–99)

## 2017-03-10 LAB — PROTIME-INR
INR: 1.37
PROTHROMBIN TIME: 17 s — AB (ref 11.4–15.2)

## 2017-03-11 ENCOUNTER — Other Ambulatory Visit
Admission: RE | Admit: 2017-03-11 | Discharge: 2017-03-11 | Disposition: A | Discharge status: Home / Self Care | Payer: No Typology Code available for payment source | Source: Ambulatory Visit | Attending: Internal Medicine | Admitting: Internal Medicine

## 2017-03-11 DIAGNOSIS — Z952 Presence of prosthetic heart valve: Secondary | ICD-10-CM | POA: Insufficient documentation

## 2017-03-11 DIAGNOSIS — D649 Anemia, unspecified: Secondary | ICD-10-CM | POA: Insufficient documentation

## 2017-03-11 LAB — CBC WITH DIFFERENTIAL/PLATELET
BASOS ABS: 0 10*3/uL (ref 0–0.1)
Basophils Relative: 1 %
Eosinophils Absolute: 0.3 10*3/uL (ref 0–0.7)
Eosinophils Relative: 4 %
HEMATOCRIT: 29.1 % — AB (ref 35.0–47.0)
Hemoglobin: 9.4 g/dL — ABNORMAL LOW (ref 12.0–16.0)
LYMPHS PCT: 27 %
Lymphs Abs: 1.6 10*3/uL (ref 1.0–3.6)
MCH: 25.5 pg — ABNORMAL LOW (ref 26.0–34.0)
MCHC: 32.3 g/dL (ref 32.0–36.0)
MCV: 78.9 fL — AB (ref 80.0–100.0)
Monocytes Absolute: 0.9 10*3/uL (ref 0.2–0.9)
Monocytes Relative: 15 %
NEUTROS ABS: 3.2 10*3/uL (ref 1.4–6.5)
NEUTROS PCT: 53 %
PLATELETS: 167 10*3/uL (ref 150–440)
RBC: 3.69 MIL/uL — AB (ref 3.80–5.20)
RDW: 20.9 % — ABNORMAL HIGH (ref 11.5–14.5)
WBC: 5.9 10*3/uL (ref 3.6–11.0)

## 2017-03-11 LAB — GLUCOSE, CAPILLARY: Glucose-Capillary: 173 mg/dL — ABNORMAL HIGH (ref 65–99)

## 2017-03-11 LAB — PROTIME-INR
INR: 3.97
PROTHROMBIN TIME: 39.8 s — AB (ref 11.4–15.2)

## 2017-03-11 LAB — COMPREHENSIVE METABOLIC PANEL
ALT: 20 U/L (ref 14–54)
AST: 23 U/L (ref 15–41)
Albumin: 3 g/dL — ABNORMAL LOW (ref 3.5–5.0)
Alkaline Phosphatase: 90 U/L (ref 38–126)
Anion gap: 7 (ref 5–15)
BILIRUBIN TOTAL: 1.7 mg/dL — AB (ref 0.3–1.2)
BUN: 28 mg/dL — ABNORMAL HIGH (ref 6–20)
CHLORIDE: 108 mmol/L (ref 101–111)
CO2: 24 mmol/L (ref 22–32)
CREATININE: 1.86 mg/dL — AB (ref 0.44–1.00)
Calcium: 8.6 mg/dL — ABNORMAL LOW (ref 8.9–10.3)
GFR calc Af Amer: 29 mL/min — ABNORMAL LOW (ref >60)
GFR, EST NON AFRICAN AMERICAN: 25 mL/min — AB (ref >60)
GLUCOSE: 101 mg/dL — AB (ref 65–99)
Potassium: 3.6 mmol/L (ref 3.5–5.1)
Sodium: 139 mmol/L (ref 135–145)
Total Protein: 5.5 g/dL — ABNORMAL LOW (ref 6.5–8.1)

## 2017-03-12 ENCOUNTER — Encounter (HOSPITAL_COMMUNITY): Payer: Self-pay | Admitting: Internal Medicine

## 2017-03-12 ENCOUNTER — Other Ambulatory Visit: Payer: Self-pay | Admitting: Internal Medicine

## 2017-03-12 ENCOUNTER — Ambulatory Visit (HOSPITAL_COMMUNITY)
Admission: RE | Admit: 2017-03-12 | Discharge: 2017-03-12 | Disposition: A | Discharge status: Home / Self Care | Payer: No Typology Code available for payment source | Source: Ambulatory Visit | Attending: Internal Medicine | Admitting: Internal Medicine

## 2017-03-12 VITALS — BP 130/78 | HR 91 | Wt 148.0 lb

## 2017-03-12 DIAGNOSIS — Z7901 Long term (current) use of anticoagulants: Secondary | ICD-10-CM | POA: Insufficient documentation

## 2017-03-12 DIAGNOSIS — Z79891 Long term (current) use of opiate analgesic: Secondary | ICD-10-CM | POA: Insufficient documentation

## 2017-03-12 DIAGNOSIS — I5022 Chronic systolic (congestive) heart failure: Secondary | ICD-10-CM | POA: Diagnosis not present

## 2017-03-12 DIAGNOSIS — N184 Chronic kidney disease, stage 4 (severe): Secondary | ICD-10-CM | POA: Insufficient documentation

## 2017-03-12 DIAGNOSIS — Z9889 Other specified postprocedural states: Secondary | ICD-10-CM | POA: Insufficient documentation

## 2017-03-12 DIAGNOSIS — I2510 Atherosclerotic heart disease of native coronary artery without angina pectoris: Secondary | ICD-10-CM | POA: Diagnosis not present

## 2017-03-12 DIAGNOSIS — J3801 Paralysis of vocal cords and larynx, unilateral: Secondary | ICD-10-CM | POA: Insufficient documentation

## 2017-03-12 DIAGNOSIS — Z953 Presence of xenogenic heart valve: Secondary | ICD-10-CM | POA: Insufficient documentation

## 2017-03-12 DIAGNOSIS — I480 Paroxysmal atrial fibrillation: Secondary | ICD-10-CM | POA: Insufficient documentation

## 2017-03-12 DIAGNOSIS — Z951 Presence of aortocoronary bypass graft: Secondary | ICD-10-CM | POA: Insufficient documentation

## 2017-03-12 DIAGNOSIS — Z79899 Other long term (current) drug therapy: Secondary | ICD-10-CM | POA: Insufficient documentation

## 2017-03-12 DIAGNOSIS — I130 Hypertensive heart and chronic kidney disease with heart failure and stage 1 through stage 4 chronic kidney disease, or unspecified chronic kidney disease: Secondary | ICD-10-CM | POA: Insufficient documentation

## 2017-03-12 DIAGNOSIS — E1122 Type 2 diabetes mellitus with diabetic chronic kidney disease: Secondary | ICD-10-CM | POA: Insufficient documentation

## 2017-03-12 DIAGNOSIS — R1312 Dysphagia, oropharyngeal phase: Secondary | ICD-10-CM

## 2017-03-12 DIAGNOSIS — I214 Non-ST elevation (NSTEMI) myocardial infarction: Secondary | ICD-10-CM | POA: Insufficient documentation

## 2017-03-12 DIAGNOSIS — I200 Unstable angina: Secondary | ICD-10-CM | POA: Diagnosis not present

## 2017-03-12 DIAGNOSIS — E785 Hyperlipidemia, unspecified: Secondary | ICD-10-CM | POA: Insufficient documentation

## 2017-03-12 LAB — GLUCOSE, CAPILLARY: Glucose-Capillary: 166 mg/dL — ABNORMAL HIGH (ref 65–99)

## 2017-03-12 MED ORDER — AMIODARONE HCL 200 MG PO TABS: 200.0000 mg | ORAL_TABLET | Freq: Every day | ORAL | Status: DC

## 2017-03-12 MED ORDER — CARVEDILOL 3.125 MG PO TABS: 3.1250 mg | ORAL_TABLET | Freq: Two times a day (BID) | ORAL | 3 refills | Status: DC

## 2017-03-12 MED ORDER — SPIRONOLACTONE 25 MG PO TABS: 12.5000 mg | ORAL_TABLET | Freq: Every day | ORAL | 3 refills | Status: DC

## 2017-03-12 NOTE — Progress Notes (Signed)
ADVANCED HF CLINIC NOTE  Primary Cardiologist: Dev Dhondt  HPI:  [name] [name] is a 82 year old female with a history of type 2 diabetes mellitus, hypertension, hyperlipidemia and CKD.   Admiited on 02/03/2017 with NSTEMI. Emergent cardiac catheterization which showed three-vessel coronary artery disease. She developed profound shock with multi-system organ failure and found to have severe MR due to partial flail of MV. Underwent emergent CABG x 4 (Coronary artery bypass grafting x 4   Left internal mammary graft to the LAD  Sequential SVG to diagonal and OM  SVG to PDA and bioprosthetic MVR   Had prolonged post-op course with renal failure, PAF (resolved with amio), paralyzed left vocal cord and HF. Post-op Echo with EF 40% RV normal, and stable MVR. ECHO 3/4 EF 20-25% RV mild-moderately reduced. Trivial pericardial effusion.   On d/c was 152 pounds.  Cr 2.3  Now at [facility]. Doing much better. Ambulating with walker. No CP or SOB. Mild swelling in her feet. No orthopnea or PND. On coumadin. No bleeding.  Vital signs from [facility]  SBP 115-133 Weight 149. Taking lasix 40 bid. Last creatinine 1.86 on 3/21 K 3.6   ROS: All systems negative except as listed in HPI, PMH and Problem List.  SH:  Social History   Social History  . Marital status: Married    Spouse name: N/A  . Number of children: N/A  . Years of education: N/A   Occupational History  . Not on file.   Social History Main Topics  . Smoking status: Never Smoker  . Smokeless tobacco: Never Used  . Alcohol use No  . Drug use: No  . Sexual activity: Not on file   Other Topics Concern  . Not on file   Social History Narrative  . No narrative on file    FH:  Family History  Problem Relation Age of Onset  . Diabetes Father   . Diabetes Sister     Past Medical History:  Diagnosis Date  . [name]    a. LHC 02/03/17 for unstable angina showed severe 3v CAD w/ sequential 70% & 80%  p-mLAD, 99% mLCx, CTO mRCA, elevated LV filling pressure  . Colon polyp 2010  . Diabetes mellitus with complication ([place])   . Glaucoma   . Hypertension     Current Outpatient Prescriptions  Medication Sig Dispense Refill  . acetaminophen (TYLENOL) 500 MG tablet Take 500 mg by mouth every 6 (six) hours as needed for mild pain.     Marland Kitchen amiodarone (PACERONE) 200 MG tablet Take 1 tablet (200 mg total) by mouth 2 (two) times daily. For 7 days, then once daily    . atorvastatin (LIPITOR) 80 MG tablet Take 1 tablet (80 mg total) by mouth daily at 6 PM.    . Calcium Carbonate-Vitamin D (CALCIUM + D PO) Take 1 tablet by mouth daily.    . furosemide (LASIX) 40 MG tablet Take 1 tablet (40 mg total) by mouth 2 (two) times daily. 30 tablet   . hydrALAZINE (APRESOLINE) 25 MG tablet Take 1 tablet (25 mg total) by mouth 3 (three) times daily.    . isosorbide mononitrate (IMDUR) 30 MG 24 hr tablet Take 1 tablet (30 mg total) by mouth daily.    Marland Kitchen latanoprost (XALATAN) 0.005 % ophthalmic solution Place 1 drop into both eyes at bedtime.     . potassium chloride SA (K-DUR,KLOR-CON) 20 MEQ tablet Take 2 tablets (40 mEq total) by mouth 2 (two) times  daily.    . traMADol (ULTRAM) 50 MG tablet Take 1-2 tablets (50-100 mg total) by mouth every 12 (twelve) hours as needed for moderate pain. 30 tablet 0  . warfarin (COUMADIN) 1 MG tablet Take 2 tablets (2 mg total) by mouth daily.     No current facility-administered medications for this encounter.     Vitals:   03/12/17 0939  BP: 130/78  Pulse: 91  SpO2: 98%  Weight: 148 lb (67.1 kg)    PHYSICAL EXAM:  General:  Well appearing. No resp difficulty Voice hoarse but improvig HEENT: normal Neck: supple. JVP 7-8 cm Carotids 2+ bilaterally; no bruits. No lymphadenopathy or thryomegaly appreciated. Cor: PMI normal. Regular rate & rhythm. No rubs, gallops or murmurs. Sternal incision healing weell Lungs: clear no wheeze Abdomen: soft, nontender, nondistended. No  hepatosplenomegaly. No bruits or masses. Good bowel sounds. Extremities: no cyanosis, clubbing, rash, 1-2+ edema at ankles  Neuro: alert & orientedx3, cranial nerves grossly intact. Moves all 4 extremities w/o difficulty. Affect pleasant.   ASSESSMENT & PLAN:  1. NSTEMI with CAD [name] on 2/15 --Doing very well.  --No further ischemic symptoms --Off ASA due to warfarin  --On atorvastatin --Continue rehab. Will refer to CR when out of [facility] --Start low-dose carvedilol 3.125 bid 2. Severe MR with flail segment s/p MVR c/ bioprosthetic valve. -- stable continue coumadin/ -- reminded about SBE prophylaxis 3. Chronic systolic HF :  --Echo 8/50 EF 40% MVR ok. Echo 3/5 EF 20-25% --Weight is coming down but fluid is a little elevated. K is low --Continue lasix 40 bid. Start spiro 12.5 daily. BMET 1 week --Continue hydralazine/nitrates --Start carvedilol 3.125 bid --Eventually will start Entresto as kidneys stabilize further. Will schedule appt with HF pharmacist in 2 weeks.  --Repeat echo in 2-3 months 4. CKD stage 4 --Improving.  5. PAF --Maintaining NSR on amio. Decrease to 200 daily 6. L vocal cord paralysis --improving   Total time spent 45 minutes. Over half that time spent discussing above.   [name] Bickers, MD  11:20 AM

## 2017-03-12 NOTE — Addendum Note (Signed)
Encounter addended by: Scarlette Calico, RN on: 03/12/2017 11:28 AM<BR>    Actions taken: Order list changed, Sign clinical note

## 2017-03-12 NOTE — Patient Instructions (Signed)
Following instructions sent to [facility]:   Add Spironolactone 12.5 mg daily  Bmet in 1 week, fax results to [phone]  Add Carvedilol 3.125 mg Twice daily   Decrease Amiodarone to 200 mg daily  Follow up in 2 weeks: Thur April 5th at 9:30 am

## 2017-03-13 ENCOUNTER — Other Ambulatory Visit
Admission: RE | Admit: 2017-03-13 | Discharge: 2017-03-13 | Disposition: A | Discharge status: Home / Self Care | Payer: No Typology Code available for payment source | Source: Ambulatory Visit | Attending: Internal Medicine | Admitting: Internal Medicine

## 2017-03-13 DIAGNOSIS — Z952 Presence of prosthetic heart valve: Secondary | ICD-10-CM | POA: Insufficient documentation

## 2017-03-13 LAB — PROTIME-INR
INR: 3.17
Prothrombin Time: 33.2 seconds — ABNORMAL HIGH (ref 11.4–15.2)

## 2017-03-13 LAB — GLUCOSE, CAPILLARY
GLUCOSE-CAPILLARY: 156 mg/dL — AB (ref 65–99)
Glucose-Capillary: 128 mg/dL — ABNORMAL HIGH (ref 65–99)
Glucose-Capillary: 155 mg/dL — ABNORMAL HIGH (ref 65–99)

## 2017-03-17 ENCOUNTER — Other Ambulatory Visit
Admission: RE | Admit: 2017-03-17 | Discharge: 2017-03-17 | Disposition: A | Discharge status: Home / Self Care | Payer: No Typology Code available for payment source | Source: Ambulatory Visit | Attending: Gerontology | Admitting: Gerontology

## 2017-03-17 DIAGNOSIS — I480 Paroxysmal atrial fibrillation: Secondary | ICD-10-CM | POA: Insufficient documentation

## 2017-03-17 LAB — PROTIME-INR
INR: 3.63
PROTHROMBIN TIME: 37 s — AB (ref 11.4–15.2)

## 2017-03-19 ENCOUNTER — Other Ambulatory Visit
Admission: RE | Admit: 2017-03-19 | Discharge: 2017-03-19 | Disposition: A | Discharge status: Home / Self Care | Payer: Medicare Other | Source: Ambulatory Visit | Attending: Internal Medicine | Admitting: Internal Medicine

## 2017-03-19 ENCOUNTER — Non-Acute Institutional Stay (SKILLED_NURSING_FACILITY): Payer: Medicare Other | Admitting: Gerontology

## 2017-03-19 DIAGNOSIS — Z5181 Encounter for therapeutic drug level monitoring: Secondary | ICD-10-CM

## 2017-03-19 DIAGNOSIS — I509 Heart failure, unspecified: Secondary | ICD-10-CM | POA: Insufficient documentation

## 2017-03-19 DIAGNOSIS — J90 Pleural effusion, not elsewhere classified: Secondary | ICD-10-CM | POA: Diagnosis not present

## 2017-03-19 DIAGNOSIS — Z951 Presence of aortocoronary bypass graft: Secondary | ICD-10-CM

## 2017-03-19 LAB — PROTIME-INR
INR: 3.71
PROTHROMBIN TIME: 37.7 s — AB (ref 11.4–15.2)

## 2017-03-19 LAB — GLUCOSE, CAPILLARY: Glucose-Capillary: 327 mg/dL — ABNORMAL HIGH (ref 65–99)

## 2017-03-20 LAB — GLUCOSE, CAPILLARY
GLUCOSE-CAPILLARY: 233 mg/dL — AB (ref 65–99)
GLUCOSE-CAPILLARY: 192 mg/dL — AB (ref 65–99)
GLUCOSE-CAPILLARY: 165 mg/dL — AB (ref 65–99)
GLUCOSE-CAPILLARY: 150 mg/dL — AB (ref 65–99)
Glucose-Capillary: 113 mg/dL — ABNORMAL HIGH (ref 65–99)

## 2017-03-26 ENCOUNTER — Ambulatory Visit (HOSPITAL_COMMUNITY)
Admission: RE | Admit: 2017-03-26 | Discharge: 2017-03-26 | Disposition: A | Discharge status: Home / Self Care | Payer: Medicare Other | Source: Ambulatory Visit | Attending: Internal Medicine | Admitting: Internal Medicine

## 2017-03-26 VITALS — BP 140/72 | HR 67 | Wt 145.4 lb

## 2017-03-26 DIAGNOSIS — I5022 Chronic systolic (congestive) heart failure: Secondary | ICD-10-CM | POA: Diagnosis present

## 2017-03-26 DIAGNOSIS — I10 Essential (primary) hypertension: Secondary | ICD-10-CM

## 2017-03-26 DIAGNOSIS — J3801 Paralysis of vocal cords and larynx, unilateral: Secondary | ICD-10-CM | POA: Diagnosis not present

## 2017-03-26 DIAGNOSIS — I130 Hypertensive heart and chronic kidney disease with heart failure and stage 1 through stage 4 chronic kidney disease, or unspecified chronic kidney disease: Secondary | ICD-10-CM | POA: Insufficient documentation

## 2017-03-26 DIAGNOSIS — E118 Type 2 diabetes mellitus with unspecified complications: Secondary | ICD-10-CM

## 2017-03-26 DIAGNOSIS — Z79899 Other long term (current) drug therapy: Secondary | ICD-10-CM | POA: Diagnosis not present

## 2017-03-26 DIAGNOSIS — Z7901 Long term (current) use of anticoagulants: Secondary | ICD-10-CM | POA: Diagnosis not present

## 2017-03-26 DIAGNOSIS — I480 Paroxysmal atrial fibrillation: Secondary | ICD-10-CM | POA: Insufficient documentation

## 2017-03-26 DIAGNOSIS — I252 Old myocardial infarction: Secondary | ICD-10-CM | POA: Insufficient documentation

## 2017-03-26 DIAGNOSIS — N184 Chronic kidney disease, stage 4 (severe): Secondary | ICD-10-CM | POA: Diagnosis not present

## 2017-03-26 DIAGNOSIS — Z953 Presence of xenogenic heart valve: Secondary | ICD-10-CM | POA: Insufficient documentation

## 2017-03-26 DIAGNOSIS — I2510 Atherosclerotic heart disease of native coronary artery without angina pectoris: Secondary | ICD-10-CM | POA: Diagnosis not present

## 2017-03-26 DIAGNOSIS — E1122 Type 2 diabetes mellitus with diabetic chronic kidney disease: Secondary | ICD-10-CM | POA: Diagnosis not present

## 2017-03-26 DIAGNOSIS — Z951 Presence of aortocoronary bypass graft: Secondary | ICD-10-CM | POA: Diagnosis not present

## 2017-03-26 LAB — BASIC METABOLIC PANEL
Anion gap: 9 (ref 5–15)
BUN: 28 mg/dL — ABNORMAL HIGH (ref 6–20)
CHLORIDE: 104 mmol/L (ref 101–111)
CO2: 25 mmol/L (ref 22–32)
Calcium: 8.8 mg/dL — ABNORMAL LOW (ref 8.9–10.3)
Creatinine, Ser: 2.08 mg/dL — ABNORMAL HIGH (ref 0.44–1.00)
GFR, EST AFRICAN AMERICAN: 25 mL/min — AB (ref >60)
GFR, EST NON AFRICAN AMERICAN: 22 mL/min — AB (ref >60)
Glucose, Bld: 261 mg/dL — ABNORMAL HIGH (ref 65–99)
POTASSIUM: 3.8 mmol/L (ref 3.5–5.1)
Sodium: 138 mmol/L (ref 135–145)

## 2017-03-26 LAB — BRAIN NATRIURETIC PEPTIDE: B NATRIURETIC PEPTIDE 5: 1755.6 pg/mL — AB (ref 0.0–100.0)

## 2017-03-26 MED ORDER — SPIRONOLACTONE 25 MG PO TABS: 25.0000 mg | ORAL_TABLET | Freq: Every day | ORAL | 6 refills | Status: DC

## 2017-03-26 MED ORDER — HYDRALAZINE HCL 25 MG PO TABS: 37.5000 mg | ORAL_TABLET | Freq: Three times a day (TID) | ORAL | 6 refills | Status: DC

## 2017-03-26 NOTE — Progress Notes (Signed)
1 

## 2017-03-26 NOTE — Patient Instructions (Signed)
Routine lab work today. Will notify you of abnormal results, otherwise no news is good news!  INCREASE Hydralazine to 37.5 mg (1.5 tabs) three times daily (once every 8 hours).  Follow up with Ileene Patrick CHF Clinical Pharmacist in 3 weeks.  ________________________________________________________  ________________________________________________________  Follow up with Dr. Haroldine Laws in 6 weeks.  __________________________________________________________  __________________________________________________________  Do the following things EVERYDAY: 1) Weigh yourself in the morning before breakfast. Write it down and keep it in a log. 2) Take your medicines as prescribed 3) Eat low salt foods-Limit salt (sodium) to 2000 mg per day.  4) Stay as active as you can everyday 5) Limit all fluids for the day to less than 2 liters

## 2017-03-26 NOTE — Progress Notes (Signed)
Advanced Heart Failure Clinic Note   PCP: Dr. Ouida Sills Primary Cardiologist: Bensimhon  HPI:  [name] is a 82 year old female with a history of type 2 diabetes mellitus, hypertension, hyperlipidemia and CKD.   Admiited on 02/03/2017 with NSTEMI. Emergent cardiac catheterization which showed three-vessel coronary artery disease. She developed profound shock with multi-system organ failure and found to have severe MR due to partial flail of MV. Underwent emergent CABG x 4 (Coronary artery bypass grafting x 4   Left internal mammary graft to the LAD  Sequential SVG to diagonal and OM  SVG to PDA and bioprosthetic MVR   Had prolonged post-op course with renal failure, PAF (resolved with amio), paralyzed left vocal cord and HF. Post-op Echo with EF 40% RV normal, and stable MVR. ECHO 3/4 EF 20-25% RV mild-moderately reduced. Trivial pericardial effusion.   On d/c was 152 pounds.  Cr 2.3  She presents today for regular follow up. Back at home now, PT seeing in home. At last visit started on coreg and spiro. Has been taking spiro 25 mg daily. She initially felt more fatigue, lightheaded, and had HAs. All these have improved but remains slightly lightheaded with rapid standing. Breathing has been better. Had mild SOB walking back to clinic and has mild DOE with changing clothes and bathing.  Weight at home ~140. By our scales down 3 lbs from 2 weeks ago. Taking all medications. No BRBPR or melena. No N/V, fever, chills, or diarrhea. No near syncope.   Review of systems complete and found to be negative unless listed in HPI.    SH:  Social History   Social History  . Marital status: Married    Spouse name: N/A  . Number of children: N/A  . Years of education: N/A   Occupational History  . Not on file.   Social History Main Topics  . Smoking status: Never Smoker  . Smokeless tobacco: Never Used  . Alcohol use No  . Drug use: No  . Sexual activity: Not on file   Other  Topics Concern  . Not on file   Social History Narrative  . No narrative on file    FH:  Family History  Problem Relation Age of Onset  . Diabetes Father   . Diabetes Sister     Past Medical History:  Diagnosis Date  . [name]    a. LHC 02/03/17 for unstable angina showed severe 3v CAD w/ sequential 70% & 80% p-mLAD, 99% mLCx, CTO mRCA, elevated LV filling pressure  . Colon polyp 2010  . Diabetes mellitus with complication ([place])   . Glaucoma   . Hypertension     Current Outpatient Prescriptions  Medication Sig Dispense Refill  . acetaminophen (TYLENOL) 500 MG tablet Take 500 mg by mouth every 6 (six) hours as needed for mild pain.     Marland Kitchen amiodarone (PACERONE) 200 MG tablet Take 1 tablet (200 mg total) by mouth daily. For 7 days, then once daily    . atorvastatin (LIPITOR) 80 MG tablet Take 1 tablet (80 mg total) by mouth daily at 6 PM.    . Calcium Carbonate-Vitamin D (CALCIUM + D PO) Take 1 tablet by mouth daily.    . carvedilol (COREG) 3.125 MG tablet Take 1 tablet (3.125 mg total) by mouth 2 (two) times daily. 180 tablet 3  . furosemide (LASIX) 40 MG tablet Take 1 tablet (40 mg total) by mouth 2 (two) times daily. 30 tablet   . hydrALAZINE (  APRESOLINE) 25 MG tablet Take 1 tablet (25 mg total) by mouth 3 (three) times daily.    . isosorbide mononitrate (IMDUR) 30 MG 24 hr tablet Take 1 tablet (30 mg total) by mouth daily.    Marland Kitchen latanoprost (XALATAN) 0.005 % ophthalmic solution Place 1 drop into both eyes at bedtime.     . potassium chloride SA (K-DUR,KLOR-CON) 20 MEQ tablet Take 2 tablets (40 mEq total) by mouth 2 (two) times daily.    Marland Kitchen spironolactone (ALDACTONE) 25 MG tablet Take 25 mg by mouth daily.    . traMADol (ULTRAM) 50 MG tablet Take 1-2 tablets (50-100 mg total) by mouth every 12 (twelve) hours as needed for moderate pain. 30 tablet 0  . warfarin (COUMADIN) 1 MG tablet Take 2 tablets (2 mg total) by mouth daily.     No current facility-administered  medications for this encounter.     Vitals:   03/26/17 0940  BP: 140/72  Pulse: 67  SpO2: 96%  Weight: 145 lb 6.4 oz (66 kg)    Wt Readings from Last 3 Encounters:  03/26/17 145 lb 6.4 oz (66 kg)  03/12/17 148 lb (67.1 kg)  03/05/17 152 lb 6.4 oz (69.1 kg)    PHYSICAL EXAM:  General:  Elderly appearing AA female in NAD.  HEENT: Normal.  Neck: Supple. JVP 6-7 cm. Carotids 2+ bilat; no bruits. No thyromegaly or nodule noted.   Cor: PMI normal. RRR. No M/G/R noted. Sternal incision healing well. Lungs: CTAB, normal effort Abdomen: Soft, NT, ND, no HSM. No bruits or masses. +BS.  Extremities: No cyanosis, clubbing, or rash. Trace ankle edema at most.   Neuro: Alert & oriented x 3. Cranial nerves grossly intact. Moves all 4 extremities w/o difficulty. Affect pleasant    ASSESSMENT & PLAN:  1. NSTEMI with CAD [name] on 2/15 - Overall continues to improve.  No further ischemic symptoms.  - No ASA due to stable CAD on coumadin.  - Continue atorvastatin. - Getting PT at home, will need CR once finished.   - Continue coreg 3.125 mg BID.  2. Severe MR with flail segment s/p MVR c/ bioprosthetic valve. - Stable. Continue coumadin.  - Knows she will need SBE prophylaxis with any dental procedures.  3. Chronic systolic HF :  - Echo 2/83 EF 40% MVR ok. Echo 3/5 EF 20-25% - Volume stable on exam. NYHA III symptoms which seem to be her baseline.  - Continue lasix 40 mg BID.  - Continue lasix 25 mg as this is what she has been taking.  BMET today.  - Increase hydralazine 37.5 mg TID. Continue imdur 30 mg daily.  - Continue carvedilol 3.125 bid - Consider Entresto as kidneys stabilize further. Will schedule appt with HF pharmacist in 3 weeks. BMET today.  --Repeat echo in 2-3 months - Reinforced fluid restriction to < 2 L daily, sodium restriction to less than 2000 mg daily, and the importance of daily weights.   4. CKD stage 4 - BMET today.  5. PAF - NSR on exam. Continue  amiodarone 200 mg daily.  6. L vocal cord paralysis - Voice much more clear. Seems nearly resolved.  7. DM2 - OK to resume Onglyza. She has PCP follow up next week.  OK to wait until this appointment. BGs at home 160-170.  Shirley Friar, PA-C  9:58 AM  Greater than 50% of the 25 minute visit was spent in counseling/coordination of care regarding disease state education, medication reconciliation,  discussion of medical regimen in person with on site Pharm-D, and fluid/salt restriction education.

## 2017-03-27 ENCOUNTER — Ambulatory Visit
Admission: RE | Admit: 2017-03-27 | Discharge: 2017-03-27 | Disposition: A | Discharge status: Home / Self Care | Payer: Medicare Other | Source: Ambulatory Visit | Attending: Internal Medicine | Admitting: Internal Medicine

## 2017-03-27 ENCOUNTER — Other Ambulatory Visit: Payer: Self-pay | Admitting: Surgery

## 2017-03-27 DIAGNOSIS — R1312 Dysphagia, oropharyngeal phase: Secondary | ICD-10-CM

## 2017-03-27 DIAGNOSIS — R1313 Dysphagia, pharyngeal phase: Secondary | ICD-10-CM | POA: Diagnosis present

## 2017-03-27 DIAGNOSIS — Z951 Presence of aortocoronary bypass graft: Secondary | ICD-10-CM

## 2017-03-27 NOTE — Therapy (Signed)
[facility] [place], [place], [address] Phone: [phone]   Fax:     Modified Barium Swallow  Patient Details  Name: [name] [name] MRN: [id number] Date of Birth: [birth year] No Data Recorded  Encounter Date: 03/27/2017      End of Session - 03/27/17 1329    Visit Number 1   Number of Visits 1   Date for SLP Re-Evaluation 03/27/17   SLP Start Time 85   SLP Stop Time  1329   SLP Time Calculation (min) 59 min   Activity Tolerance Patient tolerated treatment well      Past Medical History:  Diagnosis Date  . [name]    a. LHC 02/03/17 for unstable angina showed severe 3v CAD w/ sequential 70% & 80% p-mLAD, 99% mLCx, CTO mRCA, elevated LV filling pressure  . Colon polyp 2010  . Diabetes mellitus with complication ([place])   . Glaucoma   . Hypertension     Past Surgical History:  Procedure Laterality Date  . APPENDECTOMY    . BREAST BIOPSY Right 1999  . COLONOSCOPY  2010  . CORONARY ARTERY BYPASS GRAFT N/A 02/05/2017   Procedure: CORONARY ARTERY BYPASS GRAFTING (CABG) x 4                                                                               LIMA-LAD SEQ SVG-DIAG-OM SVG-PD;  Surgeon: Gaye Pollack, MD;  Location: [facility];  Service: Open Heart Surgery;  Laterality: N/A;  . IABP INSERTION N/A 02/04/2017   Procedure: IABP Insertion;  Surgeon: Leonie Man, MD;  Location: [facility];  Service: Cardiovascular;  Laterality: N/A;  . LEFT HEART CATH AND CORONARY ANGIOGRAPHY Left 02/03/2017   Procedure: Left Heart Cath and Coronary Angiography;  Surgeon: Nelva Bush, MD;  Location: [facility];  Service: Cardiovascular;  Laterality: Left;  . MITRAL VALVE REPAIR N/A 02/05/2017   Procedure: MITRAL VALVE REPLACEMENT (MVR) WITH MAGNA MITRAL EASE PERICARDIAL BIOPROSTHESIS 25 MM;  Surgeon: Gaye Pollack, MD;  Location: [facility];  Service: Open Heart Surgery;  Laterality: N/A;  .  PARATHYROIDECTOMY     1986  . RIGHT HEART CATH N/A 02/04/2017   Procedure: Right Heart Cath;  Surgeon: Leonie Man, MD;  Location: [facility];  Service: Cardiovascular;  Laterality: N/A;  . TEE WITHOUT CARDIOVERSION N/A 02/05/2017   Procedure: TRANSESOPHAGEAL ECHOCARDIOGRAM (TEE);  Surgeon: Gaye Pollack, MD;  Location: [facility];  Service: Open Heart Surgery;  Laterality: N/A;    There were no vitals filed for this visit.     Subjective: Patient behavior: (alertness, ability to follow instructions, etc.): the patient is able to report her swallowing history and follow directions  Chief complaint: 2 previous MBS with aspiration of thin and nectar-thick liquid   Objective:  Radiological Procedure: A videoflouroscopic evaluation of oral-preparatory, reflex initiation, and pharyngeal phases of the swallow was performed; as well as a screening of the upper esophageal phase.  I. POSTURE: Upright in MBS chair  II. VIEW: Lateral  III. COMPENSATORY STRATEGIES: N/A  IV. BOLUSES ADMINISTERED:   Thin Liquid: 3 cup rim sips  Nectar-thick Liquid: 2 cup rim sips   Honey-thick Liquid: 1 cup rim sip   Puree: DNT   Mechanical Soft: DNT  V. RESULTS OF EVALUATION: A. ORAL PREPARATORY PHASE: (The lips, tongue, and velum are observed for strength and coordination)       **Overall Severity Rating: Within normal limits  B. SWALLOW INITIATION/REFLEX: (The reflex is normal if "triggered" by the time the bolus reached the base of the tongue)  **Overall Severity Rating: Mild; triggers while falling from the valleculae to the pyriform sinuses  C. PHARYNGEAL PHASE: (Pharyngeal function is normal if the bolus shows rapid, smooth, and continuous transit through the pharynx and there is no pharyngeal residue after the swallow)  **Overall Severity Rating: Within normal limits  D. LARYNGEAL PENETRATION: (Material entering into the laryngeal inlet/vestibule but not aspirated)  None  E. ASPIRATION: None  F. ESOPHAGEAL PHASE: (Screening of the upper esophagus): no observed abnormality within the viewable cervical esophagus  ASSESSMENT: This 82 year old woman, with 2 previous swallow studies showing aspiration of thin and nectar thick liquid, is presenting with minimal oropharyngeal dysphagia.  Oral control of the bolus including oral hold, rotary mastication, and anterior to posterior transfer are within normal limits. Timing of the pharyngeal swallow is delayed, triggering while falling from the valleculae to the pyriform sinuses.  Aspects of the pharyngeal stage of swallowing including tongue base retraction, hyolaryngeal excursion, epiglottic inversion, and duration/amplitude of UES opening are within normal limits.  There is no pharyngeal residue.  There was no observed laryngeal penetration or tracheal aspiration.  The patient is safe to resume thin liquids without particular strategies required.  The patient continues to demonstrate hoarse vocal quality and appears to be straining and may be developing maladaptive vocal behaviors.  She would benefit from referral to ENT to evaluate candidacy for voice therapy.  PLAN/RECOMMENDATIONS:   A. Diet: Regular   B. Swallowing Precautions: Stringent oral care post meals   C. Recommended consultation to: ENT- would patient benefit from voice therapy   D. Therapy recommendations: voice therapy if appropriate per ENT   E. Results and recommendations were discussed with the patient and her daughter immediately following the study and the final report routed to the referring MD and the patient's PCP.     Oropharyngeal dysphagia - Plan: DG OP Swallowing Func-Medicare/Speech Path, DG OP Swallowing Func-Medicare/Speech Path      G-Codes - 2017-04-08 1329    Functional Assessment Tool Used MBS, clinical judgment   Functional Limitations Swallowing   Swallow Current Status (F6433) At least 1 percent but less than 20 percent  impaired, limited or restricted   Swallow Goal Status (I9518) At least 1 percent but less than 20 percent impaired, limited or restricted   Swallow Discharge Status [phone]) At least 1 percent but less than 20 percent impaired, limited or restricted          Problem List Patient Active Problem List   Diagnosis Date Noted  . PAF (paroxysmal atrial fibrillation) ([place]) 03/26/2017  . CKD (chronic kidney disease), stage IV ([place]) 03/26/2017  . [name]   . [name] 02/05/2017  . Unstable angina ([place]) 02/03/2017  . [name] 02/03/2017  . Murmur 02/03/2017  . Type 2 diabetes mellitus with complication ([place]) 06/20/1600  . Essential hypertension 02/03/2017  . Absolute anemia 09/19/2014  . Encounter for screening colonoscopy 09/19/2014   Leroy Sea, MS/CCC- SLP  Valetta Fuller, Daine Floras April 08, 2017, 1:30 PM  [facility]  [place], [place], [address] Phone: [phone]   Fax:     Name: AKI BURDIN MRN: [id number] Date of Birth: [birth year]

## 2017-03-30 ENCOUNTER — Ambulatory Visit (INDEPENDENT_AMBULATORY_CARE_PROVIDER_SITE_OTHER): Payer: Self-pay | Admitting: Physician Assistant

## 2017-03-30 ENCOUNTER — Ambulatory Visit
Admission: RE | Admit: 2017-03-30 | Discharge: 2017-03-30 | Disposition: A | Discharge status: Home / Self Care | Payer: Medicare Other | Source: Ambulatory Visit | Attending: Surgery | Admitting: Surgery

## 2017-03-30 VITALS — BP 127/76 | HR 66 | Resp 18 | Ht 66.0 in | Wt 152.2 lb

## 2017-03-30 DIAGNOSIS — I2510 Atherosclerotic heart disease of native coronary artery without angina pectoris: Secondary | ICD-10-CM

## 2017-03-30 DIAGNOSIS — Z951 Presence of aortocoronary bypass graft: Secondary | ICD-10-CM

## 2017-03-30 DIAGNOSIS — I340 Nonrheumatic mitral (valve) insufficiency: Secondary | ICD-10-CM

## 2017-03-30 DIAGNOSIS — Z952 Presence of prosthetic heart valve: Secondary | ICD-10-CM

## 2017-03-30 NOTE — Progress Notes (Signed)
HPI:  Patient returns for routine postoperative follow-up having undergone CABG x 4, and MV Replacement on 02/05/2017. The patient's early postoperative recovery while in the hospital was notable for heart failure and deconditioning.  She required brief stay at [facility].  Since hospital discharge the patient reports she is doing okay.  She states she still feels fatigued at times.  Over the past several days she notices she is more short of breath and has been swelling in her left leg.  She states there is an area around one of her incisions on her left leg that is painful with sleep and is also numb at times.  She is ambulating as tolerated.  Her incisions are healing without evidence on infection.  Her INR is therapeutic.  Current Outpatient Prescriptions  Medication Sig Dispense Refill  . acetaminophen (TYLENOL) 500 MG tablet Take 500 mg by mouth every 6 (six) hours as needed for mild pain.     Marland Kitchen amiodarone (PACERONE) 200 MG tablet Take 1 tablet (200 mg total) by mouth daily. For 7 days, then once daily    . atorvastatin (LIPITOR) 80 MG tablet Take 1 tablet (80 mg total) by mouth daily at 6 PM.    . Calcium Carbonate-Vitamin D (CALCIUM + D PO) Take 1 tablet by mouth daily.    . carvedilol (COREG) 3.125 MG tablet Take 1 tablet (3.125 mg total) by mouth 2 (two) times daily. 180 tablet 3  . furosemide (LASIX) 40 MG tablet Take 1 tablet (40 mg total) by mouth 2 (two) times daily. 30 tablet   . hydrALAZINE (APRESOLINE) 25 MG tablet Take 1.5 tablets (37.5 mg total) by mouth 3 (three) times daily. 135 tablet 6  . isosorbide mononitrate (IMDUR) 30 MG 24 hr tablet Take 1 tablet (30 mg total) by mouth daily.    Marland Kitchen latanoprost (XALATAN) 0.005 % ophthalmic solution Place 1 drop into both eyes at bedtime.     . potassium chloride SA (K-DUR,KLOR-CON) 20 MEQ tablet Take 2 tablets (40 mEq total) by mouth 2 (two) times daily.    Marland Kitchen spironolactone (ALDACTONE) 25 MG tablet Take 1 tablet (25 mg total) by mouth daily. 30  tablet 6  . warfarin (COUMADIN) 1 MG tablet Take 2 tablets (2 mg total) by mouth daily. (Patient taking differently: Take 2 mg by mouth daily. OR AS DIRECTED)     No current facility-administered medications for this visit.     Physical Exam:  BP 127/76 (BP Location: Right Arm, Patient Position: Sitting, Cuff Size: Large)   Pulse 66   Resp 18   Ht 5\' 6"  (1.676 m)   Wt 152 lb 3.2 oz (69 kg)   SpO2 99% Comment: ON RA  BMI 24.57 kg/m   Gen: no apparent distress Heart; RRR Lungs: Diminished left base Ext: 1-2+ pitting edema L >R Incisions: healing without evidence on infection, there is a seroma present on the Left Thigh EVH site that is about 6 cm in diameter.  Diagnostic Tests:  CXR: Left pleural effusion, improved from prior study  A/P:  1. CV- Chronic systolic HF, last evaluated by AHF on Friday at which time she was doing well.  Her weight at that visit was 145 lbs.  Today her weight is up 7lbs, but she was this weight at discharge from hospital.  She is currently on Lasix 40 mg BID and Spironolactone 25 mg daily.  BNP was >1755 on Friday, creatinine stable at 2.08.  I spoke with Joesph July PA-C who recommended  the patient increase her Lasix dose to 80 mg in the morning for the next 2 days.  Should her weight continue to elevate and her shortness of breath persist, she should contact their office to be evaluated  2. Pulm- small left pleural effusion- improving from previous studies... Will monitor for now 3. INR therapeutic at 3.71, continue Coumadin 4. Dispo- patient stable, needs extra diuresis with hypervolemia from Friday, encouraged patient to monitor fluid and salt intake... Will have her RTC in about 4 weeks with repeat CXR and F/U with AHF should fluid status continue to worsen despite extra diuresis  Terisa Belardo, PA-C [facility] [phone]

## 2017-03-30 NOTE — Patient Instructions (Signed)
You are encouraged to enroll and participate in the outpatient cardiac rehab program beginning as soon as practical.  Endocarditis is a potentially serious infection of heart valves or inside lining of the heart.  It occurs more commonly in patients with diseased heart valves (such as patient's with aortic or mitral valve disease) and in patients who have undergone heart valve repair or replacement.  Certain surgical and dental procedures may put you at risk, such as dental cleaning, other dental procedures, or any surgery involving the respiratory, urinary, gastrointestinal tract, gallbladder or prostate gland.   To minimize your chances for develooping endocarditis, maintain good oral health and seek prompt medical attention for any infections involving the mouth, teeth, gums, skin or urinary tract.    Always notify your doctor or dentist about your underlying heart valve condition before having any invasive procedures. You will need to take antibiotics before certain procedures, including all routine dental cleanings or other dental procedures.  Your cardiologist or dentist should prescribe these antibiotics for you to be taken ahead of time.   You may return to driving an automobile as long as you are no longer requiring oral narcotic pain relievers during the daytime.  It would be wise to start driving only short distances during the daylight and gradually increase from there as you feel comfortable.  Make every effort to maintain a "heart-healthy" lifestyle with regular physical exercise and adherence to a low-fat, low-carbohydrate diet.  Continue to seek regular follow-up appointments with your primary care physician and/or cardiologist.  You may continue to gradually increase your physical activity as tolerated.  Refrain from any heavy lifting or strenuous use of your arms and shoulders until at least 8 weeks from the time of your surgery, and avoid activities that cause increased pain in your  chest on the side of your surgical incision.  Otherwise you may continue to increase activities without any particular limitations.  Increase the intensity and duration of physical activity gradually.  

## 2017-03-31 NOTE — Progress Notes (Signed)
Location:      Place of Service:  SNF (31) Provider:  Toni Arthurs, NP-C  Marden Noble, MD  Patient Care Team: Marden Noble, MD as PCP - General (Internal Medicine) Marden Noble, MD (Internal Medicine) Robert Bellow, MD (General Surgery)  Extended Emergency Contact Information Primary Emergency Contact: Erlene Senters Address: [phone]          [place], [place] [address] Johnnette Litter of [place] Phone: [phone] Relation: Spouse Secondary Emergency Contact: John Giovanni [place] of [place] Phone: [phone] Relation: Sister  Code Status:  full Goals of care: Advanced Directive information Advanced Directives 02/03/2017  Does Patient Have a Medical Advance Directive? Yes  Type of Paramedic of [place];Living will  Copy of [place] in Chart? No - copy requested     Chief Complaint  Patient presents with  . Acute Visit  . Discharge Note    HPI:  Pt is a 82 y.o. female seen today for an acute visit for cough and congestion. Pt also reports dark expectorate. Pt has been a-febrile. She is scheduled for discharge today. Also seen for discharge evaluation s/p admission to the facility for rehab following CABG. Pt has been participating in  PT/OT. She has been progressing well. She feels she is ready for discharge home and is safe for ambulation. Aside from new onset cough and congestion, pt reports she is feeling well and has no other complaints. Pt denies n/v/d/f/c/cp/sob(except when brushing teeth)/ha/abd pain/dizziness.seen today for management of Coumadin (Warfarin) for VTE Prophylaxis in the setting of A-fib. Pt has been complaint with medication regimen and is aware of dietary modifications needed. Pt is aware of importance of continued compliance with medication and testing. Pt has not displayed any adverse effects related to anticoagulant therapy such as unexplained or excessive bleeding,  bruising, hematuria, hematemesis, melena. Heart rate is controlled. INR today is 3.17. INR has been variable. No prior antibiotics given during this stay. Variability of INR possibly related to initiation and titration of Amiodarone while in the hospital. Pt reports appetite is good and is having regular BMs. VSS.   Past Medical History:  Diagnosis Date  . [name]    a. LHC 02/03/17 for unstable angina showed severe 3v CAD w/ sequential 70% & 80% p-mLAD, 99% mLCx, CTO mRCA, elevated LV filling pressure  . Colon polyp 2010  . Diabetes mellitus with complication ([place])   . Glaucoma   . Hypertension    Past Surgical History:  Procedure Laterality Date  . APPENDECTOMY    . BREAST BIOPSY Right 1999  . COLONOSCOPY  2010  . CORONARY ARTERY BYPASS GRAFT N/A 02/05/2017   Procedure: CORONARY ARTERY BYPASS GRAFTING (CABG) x 4                                                                               LIMA-LAD SEQ SVG-DIAG-OM SVG-PD;  Surgeon: Gaye Pollack, MD;  Location: [facility] OR;  Service: Open Heart Surgery;  Laterality: N/A;  . IABP INSERTION N/A 02/04/2017   Procedure: IABP Insertion;  Surgeon: Leonie Man, MD;  Location: [place] [facility];  Service:  Cardiovascular;  Laterality: N/A;  . LEFT HEART CATH AND CORONARY ANGIOGRAPHY Left 02/03/2017   Procedure: Left Heart Cath and Coronary Angiography;  Surgeon: Nelva Bush, MD;  Location: [place];  Service: Cardiovascular;  Laterality: Left;  . MITRAL VALVE REPAIR N/A 02/05/2017   Procedure: MITRAL VALVE REPLACEMENT (MVR) WITH MAGNA MITRAL EASE PERICARDIAL BIOPROSTHESIS 25 MM;  Surgeon: Gaye Pollack, MD;  Location: [facility] OR;  Service: Open Heart Surgery;  Laterality: N/A;  . PARATHYROIDECTOMY     1986  . RIGHT HEART CATH N/A 02/04/2017   Procedure: Right Heart Cath;  Surgeon: Leonie Man, MD;  Location: [place] [facility];  Service: Cardiovascular;  Laterality: N/A;  . TEE WITHOUT CARDIOVERSION N/A 02/05/2017    Procedure: TRANSESOPHAGEAL ECHOCARDIOGRAM (TEE);  Surgeon: Gaye Pollack, MD;  Location: [facility];  Service: Open Heart Surgery;  Laterality: N/A;    No Known Allergies  Allergies as of 03/19/2017   No Known Allergies     Medication List       Accurate as of 03/19/17 11:59 PM. Always use your most recent med list.          acetaminophen 500 MG tablet Commonly known as:  TYLENOL Take 500 mg by mouth every 6 (six) hours as needed for mild pain.   amiodarone 200 MG tablet Commonly known as:  PACERONE Take 1 tablet (200 mg total) by mouth daily. For 7 days, then once daily   atorvastatin 80 MG tablet Commonly known as:  LIPITOR Take 1 tablet (80 mg total) by mouth daily at 6 PM.   CALCIUM + D PO Take 1 tablet by mouth daily.   carvedilol 3.125 MG tablet Commonly known as:  COREG Take 1 tablet (3.125 mg total) by mouth 2 (two) times daily.   furosemide 40 MG tablet Commonly known as:  LASIX Take 1 tablet (40 mg total) by mouth 2 (two) times daily.   isosorbide mononitrate 30 MG 24 hr tablet Commonly known as:  IMDUR Take 1 tablet (30 mg total) by mouth daily.   latanoprost 0.005 % ophthalmic solution Commonly known as:  XALATAN Place 1 drop into both eyes at bedtime.   potassium chloride SA 20 MEQ tablet Commonly known as:  K-DUR,KLOR-CON Take 2 tablets (40 mEq total) by mouth 2 (two) times daily.   warfarin 1 MG tablet Commonly known as:  COUMADIN Take 2 tablets (2 mg total) by mouth daily.       Review of Systems  Constitutional: Negative for activity change, appetite change, chills, diaphoresis and fever.  HENT: Negative for congestion, sneezing, sore throat, trouble swallowing and voice change.   Respiratory: Positive for cough. Negative for apnea, choking, chest tightness, shortness of breath and wheezing.   Cardiovascular: Negative for chest pain, palpitations and leg swelling.  Gastrointestinal: Negative for abdominal distention, abdominal pain,  constipation, diarrhea and nausea.  Genitourinary: Negative for difficulty urinating, dysuria, frequency and urgency.  Musculoskeletal: Negative for back pain, gait problem and myalgias. Arthralgias: typical arthritis.  Skin: Negative for color change, pallor, rash and wound.  Neurological: Positive for weakness. Negative for dizziness, tremors, syncope, speech difficulty, numbness and headaches.  Psychiatric/Behavioral: Negative for agitation and behavioral problems.  All other systems reviewed and are negative.   Immunization History  Administered Date(s) Administered  . Influenza Inj Mdck Quad Pf 12/02/2016  . Pneumococcal Polysaccharide-23 04/22/2008   Pertinent  Health Maintenance Due  Topic Date Due  . FOOT EXAM  07/31/1945  . OPHTHALMOLOGY EXAM  07/31/1945  . URINE MICROALBUMIN  07/31/1945  . DEXA SCAN  07/30/2002  . PNA vac Low Risk Adult (2 of 2 - PCV13) 04/22/2009  . INFLUENZA VACCINE  07/22/2017  . HEMOGLOBIN A1C  08/03/2017   No flowsheet data found. Functional Status Survey:    Vitals:   03/19/17 0800  BP: (!) 154/80  Pulse: 82   There is no height or weight on file to calculate BMI. Physical Exam  Constitutional: She is oriented to person, place, and time. Vital signs are normal. She appears well-developed and well-nourished. She is active and cooperative. She does not appear ill. No distress.  HENT:  Head: Normocephalic and atraumatic.  Mouth/Throat: Uvula is midline, oropharynx is clear and moist and mucous membranes are normal. Mucous membranes are not pale, not dry and not cyanotic.  Eyes: Conjunctivae, EOM and lids are normal. Pupils are equal, round, and reactive to light.  Neck: Trachea normal, normal range of motion and full passive range of motion without pain. Neck supple. No JVD present. No tracheal deviation, no edema and no erythema present. No thyromegaly present.  Cardiovascular: Normal rate, regular rhythm, normal heart sounds, intact distal  pulses and normal pulses.  Exam reveals no gallop, no distant heart sounds and no friction rub.   No murmur heard. Pulses:      Dorsalis pedis pulses are 2+ on the right side, and 2+ on the left side.  B-LE 2+ pitting edema  Pulmonary/Chest: Effort normal. No accessory muscle usage. No respiratory distress. She has decreased breath sounds in the right lower field. She has no wheezes. She has no rhonchi. She has rales in the left lower field. She exhibits no tenderness.  Abdominal: Normal appearance and bowel sounds are normal. She exhibits no distension and no ascites. There is no tenderness.  Musculoskeletal: Normal range of motion. She exhibits no edema or tenderness.  Expected osteoarthritis, stiffness  Neurological: She is alert and oriented to person, place, and time. She has normal strength.  Skin: Skin is warm and dry. Laceration (incision- chest) noted. She is not diaphoretic. No cyanosis. No pallor. Nails show no clubbing.  Psychiatric: She has a normal mood and affect. Her speech is normal and behavior is normal. Judgment and thought content normal. Cognition and memory are normal.  Nursing note and vitals reviewed.   Labs reviewed:  Recent Labs  02/11/17 0338  02/12/17 0402 02/13/17 0352  02/25/17 1030  03/03/17 0425  03/05/17 0449 03/11/17 0500 03/26/17 1017  NA 142  < > 144 144  < >  --   < > 141  < > 142 139 138  K 3.4*  < > 3.4* 3.6  < >  --   < > 3.3*  < > 4.1 3.6 3.8  CL 104  < > 106 108  < >  --   < > 96*  < > 107 108 104  CO2 26  < > 26 24  < >  --   < > 36*  < > _0 GLUCOSE 183*  < > 234* 113*  < >  --   < > 139*  < > 132* 101* 261*  BUN 43*  < > 59* 64*  < >  --   < > 33*  < > 28* 28* 28*  CREATININE 2.20*  < > 2.64* 2.84*  < >  --   < > 2.28*  < > 2.30* 1.86* 2.08*  CALCIUM 8.6*  < >  8.4* 7.9*  < >  --   < > 8.8*  < > 8.8* 8.6* 8.8*  MG 2.0  --  2.1 2.0  --  1.8  --  1.7  --   --   --   --   PHOS 1.5*  --  2.9 3.7  --   --   --   --   --   --   --   --    < > = values in this interval not displayed.  Recent Labs  03/02/17 0354 03/03/17 0425 03/11/17 0500  AST 37 36 23  ALT _0 ALKPHOS 94 96 90  BILITOT 2.5* 2.6* 1.7*  PROT 4.6* 4.8* 5.5*  ALBUMIN 2.4* 2.5* 3.0*    Recent Labs  02/03/17 1118 02/04/17 0211  02/23/17 0527 02/26/17 0426 03/11/17 0500  WBC 7.8 8.6  < > 16.4* 12.8* 5.9  NEUTROABS 4.9 4.5  --   --   --  3.2  HGB 11.4* 10.2*  < > 9.4* 9.2* 9.4*  HCT 36.8 33.7*  < > 30.5* 30.0* 29.1*  MCV 72.6* 74.4*  < > 79.4 79.4 78.9*  PLT 219 192  < > 371 279 167  < > = values in this interval not displayed. Lab Results  Component Value Date   TSH 0.576 02/03/2017   Lab Results  Component Value Date   HGBA1C 6.3 (H) 02/03/2017   Lab Results  Component Value Date   CHOL 156 02/04/2017   HDL 42 02/04/2017   LDLCALC 93 02/04/2017   TRIG 104 02/04/2017   CHOLHDL 3.7 02/04/2017    Significant Diagnostic Results in last 30 days:  Dg Chest 2 View  Result Date: 03/30/2017 CLINICAL DATA:  Status post mitral valve replacement and CABG 02/05/2017. Mild soreness at incision site. EXAM: CHEST  2 VIEW COMPARISON:  Single-view of the chest 03/02/2017. PA and lateral chest 02/26/2017. FINDINGS: PICC line has been removed. Small left pleural effusion is decreased in size since the most recent examination. Trace right pleural effusion is noted. Seven intact median sternotomy wires are unchanged. Mitral valve prosthesis is seen. No pneumothorax. No evidence of edema. IMPRESSION: Decreased small bilateral pleural effusions, larger on the left with associated basilar atelectasis. No new abnormality. Cardiomegaly without edema. Electronically Signed   By: Inge Rise M.D.   On: 03/30/2017 13:05   Dg Op Swallowing Func-medicare/speech Path  Result Date: 03/27/2017 CLINICAL DATA:  Oropharyngeal dysphagia EXAM: MODIFIED BARIUM SWALLOW TECHNIQUE: Different consistencies of barium were administered orally to the patient by the Speech  Pathologist. Imaging of the pharynx was performed in the lateral projection. FLUOROSCOPY TIME:  Fluoroscopy Time:  0.6 minutes Radiation Exposure Index (if provided by the fluoroscopic device): 0.6 mGy Number of Acquired Spot Images: 0 COMPARISON:  None. FINDINGS: Thin liquid- within normal limits Nectar thick liquid- within normal limits Honey- within normal limits IMPRESSION: Modified barium swallow as described above. Please refer to the Speech Pathologists report for complete details and recommendations. Electronically Signed   By: Kathreen Devoid   On: 03/27/2017 13:06   Dg Chest Port 1 View  Result Date: 03/02/2017 CLINICAL DATA:  Shortness of breath and pleural effusion EXAM: PORTABLE CHEST 1 VIEW COMPARISON:  February 26, 2017 FINDINGS: There is persistent airspace consolidation in the left lower lobe with small left pleural effusion. Right lung is now clear except for atelectasis in the medial right base. There is cardiomegaly with pulmonary vascularity within normal limits. Patient is status  post mitral valve replacement. There is atherosclerotic calcification in the aorta. No pneumothorax. No adenopathy. No bone lesions. IMPRESSION: Persistent airspace consolidation left lower lobe with left pleural effusion. Atelectasis medial right base. Right lung otherwise clear. Stable cardiomegaly. There is aortic atherosclerosis. Electronically Signed   By: Lowella Grip III M.D.   On: 03/02/2017 07:50      Assessment/Plan 1. Pleural effusion  CXR  Furosemide 40 mg IM x 1 now  Increase Spirinolactone to 25 mg po Q day  Azithromycin 250 mg- 2 tablets today, then 1 tablet daily x 4 days  F/U with PCP asap for continuity of care and monitoring of pleural effusion  2. [name]  Stable  Increase Spironolactone to 25 mg po Q Day  F/U with Cardiologist asap for continuity of care  Met B in a few days- assess potassium level  3. Encounter for therapeutic drug monitoring  Coumadin 0.5 mg  PO Q Day at 1700 for Atrial Fibrillation starting tomorrow  Recheck INR 1 day  Monitor for s/s of bleeding, bruising, hematuria, hematemesis, melena  Check INR 3 days after initiation of antibiotic, when applicable  F/U with cardiologist or home health RN asap for monitoring   Patient is being discharged with the following home health services:  HHPT/OT/NSG/SLP  Patient is being discharged with the following durable medical equipment:  Rollator  Patient has been advised to f/u with their PCP in 1-2 weeks to bring them up to date on their rehab stay.  Social services at facility was responsible for arranging this appointment.  Pt was provided with a 30 day supply of prescriptions for medications and refills must be obtained from their PCP.  For controlled substances, a more limited supply may be provided adequate until PCP appointment only.  Family/ staff Communication:   Total Time:  Documentation:  Face to Face:  Family/Phone:   Labs/tests ordered:  PT/INR in the am, Met B in a few days- per home health  Medication list reviewed and assessed for continued appropriateness.  Vikki Ports, NP-C Geriatrics [facility] [phone], [place] [address] Cell Phone (Mon-Fri 8am-5pm):  [phone] On Call:  [phone] & follow prompts after 5pm & weekends Office Phone:  [phone] Office Fax:  [phone]

## 2017-04-13 ENCOUNTER — Ambulatory Visit (HOSPITAL_COMMUNITY)
Admission: RE | Admit: 2017-04-13 | Discharge: 2017-04-13 | Disposition: A | Discharge status: Home / Self Care | Payer: Medicare Other | Source: Ambulatory Visit | Attending: Internal Medicine | Admitting: Internal Medicine

## 2017-04-13 ENCOUNTER — Encounter (HOSPITAL_COMMUNITY): Payer: Self-pay

## 2017-04-13 ENCOUNTER — Other Ambulatory Visit (HOSPITAL_COMMUNITY): Payer: Self-pay | Admitting: Pharmacist

## 2017-04-13 VITALS — BP 122/68 | HR 62 | Wt 139.2 lb

## 2017-04-13 DIAGNOSIS — I214 Non-ST elevation (NSTEMI) myocardial infarction: Secondary | ICD-10-CM | POA: Diagnosis not present

## 2017-04-13 DIAGNOSIS — N184 Chronic kidney disease, stage 4 (severe): Secondary | ICD-10-CM | POA: Diagnosis not present

## 2017-04-13 DIAGNOSIS — I480 Paroxysmal atrial fibrillation: Secondary | ICD-10-CM | POA: Insufficient documentation

## 2017-04-13 DIAGNOSIS — J3801 Paralysis of vocal cords and larynx, unilateral: Secondary | ICD-10-CM | POA: Diagnosis not present

## 2017-04-13 DIAGNOSIS — I130 Hypertensive heart and chronic kidney disease with heart failure and stage 1 through stage 4 chronic kidney disease, or unspecified chronic kidney disease: Secondary | ICD-10-CM | POA: Diagnosis not present

## 2017-04-13 DIAGNOSIS — E1122 Type 2 diabetes mellitus with diabetic chronic kidney disease: Secondary | ICD-10-CM | POA: Insufficient documentation

## 2017-04-13 DIAGNOSIS — I5022 Chronic systolic (congestive) heart failure: Secondary | ICD-10-CM

## 2017-04-13 LAB — BASIC METABOLIC PANEL
Anion gap: 7 (ref 5–15)
BUN: 35 mg/dL — AB (ref 6–20)
CALCIUM: 9.9 mg/dL (ref 8.9–10.3)
CHLORIDE: 104 mmol/L (ref 101–111)
CO2: 25 mmol/L (ref 22–32)
CREATININE: 2.6 mg/dL — AB (ref 0.44–1.00)
GFR, EST AFRICAN AMERICAN: 19 mL/min — AB (ref >60)
GFR, EST NON AFRICAN AMERICAN: 16 mL/min — AB (ref >60)
Glucose, Bld: 113 mg/dL — ABNORMAL HIGH (ref 65–99)
Potassium: 5.4 mmol/L — ABNORMAL HIGH (ref 3.5–5.1)
SODIUM: 136 mmol/L (ref 135–145)

## 2017-04-13 LAB — CBC
HCT: 37.4 % (ref 36.0–46.0)
Hemoglobin: 11.2 g/dL — ABNORMAL LOW (ref 12.0–15.0)
MCH: 23.8 pg — ABNORMAL LOW (ref 26.0–34.0)
MCHC: 29.9 g/dL — AB (ref 30.0–36.0)
MCV: 79.6 fL (ref 78.0–100.0)
PLATELETS: 229 10*3/uL (ref 150–400)
RBC: 4.7 MIL/uL (ref 3.87–5.11)
RDW: 17.5 % — AB (ref 11.5–15.5)
WBC: 7 10*3/uL (ref 4.0–10.5)

## 2017-04-13 LAB — BRAIN NATRIURETIC PEPTIDE: B Natriuretic Peptide: 1045.2 pg/mL — ABNORMAL HIGH (ref 0.0–100.0)

## 2017-04-13 LAB — MAGNESIUM: Magnesium: 2.5 mg/dL — ABNORMAL HIGH (ref 1.7–2.4)

## 2017-04-13 LAB — FERRITIN: FERRITIN: 44 ng/mL (ref 11–307)

## 2017-04-13 MED ORDER — FUROSEMIDE 40 MG PO TABS: 40.0000 mg | ORAL_TABLET | Freq: Every day | ORAL | 11 refills | Status: DC

## 2017-04-13 NOTE — Patient Instructions (Signed)
It was great to see you today!  Please continue your current medications.   Labs today. We will call you with any abnormalities.   Please keep your appointment with Dr. Vaughan Browner on 05/21/17.

## 2017-04-13 NOTE — Progress Notes (Signed)
HF MD: BENSIMHON  HPI:  [name] is a 82 year old AA female with a history of type 2 diabetes mellitus, hypertension, hyperlipidemia and CKD.   Admiited on 02/03/2017 with NSTEMI. Emergent cardiac catheterization which showed three-vessel coronary artery disease. She developed profound shock with multi-system organ failure and found to have severe MR due to partial flail of MV. Underwent emergent CABG x 4 (Coronary artery bypass grafting x 4).  Had prolonged post-op course with renal failure, PAF (resolved with amio), paralyzed left vocal cord and HF. Post-op Echo with EF 40% RV normal, and stable MVR. ECHO 3/4 EF 20-25% RV mild-moderately reduced. On d/c was 152 pounds.  She presents today for pharmacist lead HF medication titration. At last visit on 03/26/17 she reported that she felt fatigued, lightheaded, and had HAs - but reported that they all had improved except some lightheadedness with rapid standing. At that time her hydralazine was increased to 37.5mg  TID. Today she states the dizziness is worse and that she feels like the HAs that she originally reported have not gone away. She said they arent really headaches but more of a altered state. She explained this as a "cloud" and is also associated with the dizziness. No issues with pain with urination, or fevers.  She no longer complains of SOB or fatigue. Taking all medications. Last INR was 3.71 on 03/30/17 (noted to be therapeutic? at that visits note, unsure what her goal is). She has not had any other INR checks since then. No signs or symptoms of bleeding. No N/V, fever, chills, or diarrhea. No issues with pain with urination.      . Shortness of breath/dyspnea on exertion? no  . Orthopnea/PND? no . Edema? no . Lightheadedness/dizziness? yes . Daily weights at home? Yes - 140 lbs . Blood pressure/heart rate monitoring at home? No - does on occasion, nurse comes once a week and has been in the 110-120s. . Following  low-sodium/fluid-restricted diet? yes  HF Medications: Carvedilol 3.125 mg PO BID Furosemide 40 mg PO BID Hydralazine 37.5 mg PO TID Isosorbide mononitrate 30 mg PO daily KCl 40 mEq PO BID Spironolactone 25 mg PO daily   Has the patient been experiencing any side effects to the medications prescribed?  yes  Does the patient have any problems obtaining medications due to transportation or finances?   No   Understanding of regimen: good Understanding of indications: good Potential of compliance: good Patient understands to avoid NSAIDs. Patient understands to avoid decongestants.    Pertinent Lab Values: . 4/23: Serum creatinine 2.6 (BL ~1.8-2), CO2 25, Potassium 5.4, Sodium 136, BNP 1045.2, Magnesium 2.5 . 4/23 CBC: 11.2/37.4/229  Vital Signs: . Weight: 139.4 (dry weight: 140 lb) . Blood pressure: 122/68 mmHg  . Heart rate: 62 bpm   Assessment: 1. Chronicsystolic CHF (EF 16-60%), due to ICM. NYHA class IIIsymptoms. - Patient with continued dizziness and complaints of an "unclearness" in her head. EKG performed showing NSR with t-wave inversion, of which Dr. Haroldine Laws reviewed and thought to be chronic and stable. - Had plans to consider Entresto, but patient says she keeps feeling worse and worse and did not want to proceed with any changes. - Continue lasix 40 mg BID, hydralazine 37.5 mg TID, imdur 30 mg daily, and carvedilol 3.125 bid - Reinforced fluid restriction to < 2 L daily, sodium restriction to less than 2000 mg daily, and the importance of daily weights.  - Basic disease state pathophysiology, medication indication, mechanism and side effects reviewed at length  with patient and he verbalized understanding 2. NSTEMI with CAD [name] on 2/15 - Overall continues to improve.  No further ischemic symptoms.  - No ASA due to stable CAD on coumadin - Continue atorvastatin and coreg - Getting PT at home, will need CR once finished 3. Severe MR with flail segment s/p  MVR c/ bioprosthetic valve - Continue coumadin - Knows she will need SBE prophylaxis with any dental procedures - Message sent to [facility] to send orders for [facility] RN with [facility] to restart weekly INR checks  4. CKD stage 4 - BMET today 5. PAF - EKG as above. Continue amiodarone 200 mg daily 6. L vocal cord paralysis - Voice much more clear. Seems nearly resolved 7. DM2 - Continue home medications  Plan: 1) Medication changes: Based on clinical presentation, vital signs and recent labs will continue current medications and follow-up on lab work. 2) Labs: BMET, CBC, iron panel, BNP, Mg 3) Follow-up: 05/21/17  Cruz Condon, PharmD, BCPS PGY2 Clinical Pharmacist  Ruta Hinds. Velva Harman, PharmD, BCPS, CPP Clinical Pharmacist Pager: [phone] Phone: [phone] 04/13/2017 1:15 PM

## 2017-04-14 ENCOUNTER — Other Ambulatory Visit (HOSPITAL_COMMUNITY): Payer: Self-pay | Admitting: Pharmacist

## 2017-04-14 MED ORDER — ATORVASTATIN CALCIUM 80 MG PO TABS: 80.0000 mg | ORAL_TABLET | Freq: Every day | ORAL | 3 refills | Status: DC

## 2017-04-14 MED ORDER — HYDRALAZINE HCL 25 MG PO TABS: 37.5000 mg | ORAL_TABLET | Freq: Three times a day (TID) | ORAL | 11 refills | Status: DC

## 2017-04-14 MED ORDER — SPIRONOLACTONE 25 MG PO TABS: 25.0000 mg | ORAL_TABLET | Freq: Every day | ORAL | 11 refills | Status: DC

## 2017-04-14 MED ORDER — AMIODARONE HCL 200 MG PO TABS: 200.0000 mg | ORAL_TABLET | Freq: Every day | ORAL | 3 refills | Status: DC

## 2017-04-14 MED ORDER — FUROSEMIDE 40 MG PO TABS: 40.0000 mg | ORAL_TABLET | Freq: Every day | ORAL | 11 refills | Status: DC

## 2017-04-14 MED ORDER — CARVEDILOL 3.125 MG PO TABS: 3.1250 mg | ORAL_TABLET | Freq: Two times a day (BID) | ORAL | 3 refills | Status: DC

## 2017-04-14 MED ORDER — ISOSORBIDE MONONITRATE ER 30 MG PO TB24: 30.0000 mg | ORAL_TABLET | Freq: Every day | ORAL | 11 refills | Status: DC

## 2017-04-15 ENCOUNTER — Telehealth: Payer: Self-pay | Admitting: Pharmacist

## 2017-04-15 NOTE — Telephone Encounter (Signed)
Called [facility] to see who is managing therapy and had to leave message on Nurse line.

## 2017-04-15 NOTE — Telephone Encounter (Signed)
-----   Message from [place], [facility] sent at 04/14/2017  9:12 AM EDT ----- Haze Rushing!  I saw Ms. Fouty in clinic yesterday and noticed that she hadn't had an INR drawn in over a month at this point. She said that she has a Higher education careers adviser through [facility] [employer]) that comes out weekly to see her that was drawing her INR's initially but no longer has an order for them. She said that she thought you all were managing her Coumadin but I don't see any Coumadin Clinic notes. Would you all be able to manage her Coumadin?  Thank you!

## 2017-04-15 NOTE — Telephone Encounter (Signed)
Spoke with Judeen Hammans, pts nurse with [facility], she states they were never given orders for PT/INR. As far as she is aware there is no one managing her warfarin therapy. Since pt sees Dr. Jacinto Reap, and Dr. Yvone Neu we will take over management. She has not had INR checked since discharge several weeks ago. Verbal order given to check INR tomorrow since nurse will be in home at that time. INR will be called to our coumadin clinic.

## 2017-04-16 ENCOUNTER — Ambulatory Visit (HOSPITAL_COMMUNITY)
Admission: RE | Admit: 2017-04-16 | Discharge: 2017-04-16 | Disposition: A | Discharge status: Home / Self Care | Payer: Medicare Other | Source: Ambulatory Visit | Attending: Cardiology | Admitting: Cardiology

## 2017-04-16 ENCOUNTER — Other Ambulatory Visit (HOSPITAL_COMMUNITY): Payer: Self-pay | Admitting: Pharmacist

## 2017-04-16 DIAGNOSIS — I5022 Chronic systolic (congestive) heart failure: Secondary | ICD-10-CM | POA: Insufficient documentation

## 2017-04-16 LAB — BASIC METABOLIC PANEL
ANION GAP: 7 (ref 5–15)
BUN: 37 mg/dL — ABNORMAL HIGH (ref 6–20)
CALCIUM: 9.7 mg/dL (ref 8.9–10.3)
CHLORIDE: 104 mmol/L (ref 101–111)
CO2: 25 mmol/L (ref 22–32)
Creatinine, Ser: 2.44 mg/dL — ABNORMAL HIGH (ref 0.44–1.00)
GFR calc Af Amer: 21 mL/min — ABNORMAL LOW (ref >60)
GFR calc non Af Amer: 18 mL/min — ABNORMAL LOW (ref >60)
Glucose, Bld: 187 mg/dL — ABNORMAL HIGH (ref 65–99)
Potassium: 5 mmol/L (ref 3.5–5.1)
Sodium: 136 mmol/L (ref 135–145)

## 2017-04-16 MED ORDER — ISOSORBIDE MONONITRATE ER 30 MG PO TB24: 30.0000 mg | ORAL_TABLET | Freq: Every day | ORAL | 0 refills | Status: DC

## 2017-04-16 MED ORDER — HYDRALAZINE HCL 25 MG PO TABS: 37.5000 mg | ORAL_TABLET | Freq: Three times a day (TID) | ORAL | 0 refills | Status: DC

## 2017-04-16 MED ORDER — SPIRONOLACTONE 25 MG PO TABS: 25.0000 mg | ORAL_TABLET | Freq: Every day | ORAL | 0 refills | Status: DC

## 2017-04-16 MED ORDER — FUROSEMIDE 40 MG PO TABS: 40.0000 mg | ORAL_TABLET | Freq: Every day | ORAL | 0 refills | Status: DC

## 2017-04-16 MED ORDER — ATORVASTATIN CALCIUM 80 MG PO TABS: 80.0000 mg | ORAL_TABLET | Freq: Every day | ORAL | 0 refills | Status: DC

## 2017-04-16 MED ORDER — CARVEDILOL 3.125 MG PO TABS: 3.1250 mg | ORAL_TABLET | Freq: Two times a day (BID) | ORAL | 0 refills | Status: DC

## 2017-04-16 MED ORDER — AMIODARONE HCL 200 MG PO TABS: 200.0000 mg | ORAL_TABLET | Freq: Every day | ORAL | 0 refills | Status: DC

## 2017-04-17 ENCOUNTER — Other Ambulatory Visit (HOSPITAL_COMMUNITY): Payer: Medicare Other

## 2017-04-17 LAB — POCT INR: INR: 1.1

## 2017-04-20 ENCOUNTER — Ambulatory Visit (INDEPENDENT_AMBULATORY_CARE_PROVIDER_SITE_OTHER): Payer: Medicare Other | Admitting: Cardiovascular Disease

## 2017-04-20 ENCOUNTER — Other Ambulatory Visit: Payer: Self-pay | Admitting: *Deleted

## 2017-04-20 DIAGNOSIS — I340 Nonrheumatic mitral (valve) insufficiency: Secondary | ICD-10-CM

## 2017-04-20 MED ORDER — WARFARIN SODIUM 1 MG PO TABS: ORAL_TABLET | ORAL | 1 refills | Status: DC

## 2017-04-24 LAB — POCT INR: INR: 1.2

## 2017-04-27 ENCOUNTER — Ambulatory Visit (INDEPENDENT_AMBULATORY_CARE_PROVIDER_SITE_OTHER): Payer: Medicare Other | Admitting: Pharmacist

## 2017-04-27 ENCOUNTER — Telehealth: Payer: Self-pay | Admitting: Pharmacist

## 2017-04-27 DIAGNOSIS — I340 Nonrheumatic mitral (valve) insufficiency: Secondary | ICD-10-CM

## 2017-04-27 NOTE — Telephone Encounter (Signed)
Called [facility] to try to track down INR since unable to reach nurse and INR reported as critical. Spoke with Mariann Laster - pt nurse is in the field today and should return calls later. Per Mariann Laster INR 1.2 on Friday 04/24/17.

## 2017-04-27 NOTE — Telephone Encounter (Signed)
-----   Message from Scarlette Calico, RN sent at 04/27/2017 12:45 PM EDT ----- Marykay Lex, our office closed at 3pm on Friday, I was reviewed our messages from the answering service and there was a call from this pt's [facility] Judeen Hammans [phone]) it just states calling to report critical INR.  I do not see anything documented in chart so I"m not sure if you guys got this information or not.

## 2017-04-27 NOTE — Telephone Encounter (Signed)
LMOM for nurse Judeen Hammans with call back number to coumadin clinic. INR never received as far as documented.

## 2017-04-27 NOTE — Telephone Encounter (Signed)
See anticoag encounter from 04/27/17 for further details.

## 2017-05-05 ENCOUNTER — Telehealth (HOSPITAL_COMMUNITY): Payer: Self-pay | Admitting: *Deleted

## 2017-05-05 NOTE — Telephone Encounter (Signed)
Thank you.  Agree. Needs to follow up with coumadin clinic.    Legrand Como [address] [place], PA-C 05/05/2017 1:38 PM

## 2017-05-05 NOTE — Telephone Encounter (Signed)
HHRN called stating pt missed her home visit (inr check) yesterday because she had a doctors appointment. Appointment was r/s for today but pt did not answer the door or phone.  The car was gone. So she hasnot had her INR checked. Yesterday was supposed to be patients last visit because she is doing better and not home bound. Patient is followed by the coumadin clinic and will need to set up INR check with them. Called pt no answer.   Message routed to [facility]

## 2017-05-06 ENCOUNTER — Ambulatory Visit (INDEPENDENT_AMBULATORY_CARE_PROVIDER_SITE_OTHER): Payer: Medicare Other | Admitting: Cardiovascular Disease

## 2017-05-06 DIAGNOSIS — I340 Nonrheumatic mitral (valve) insufficiency: Secondary | ICD-10-CM

## 2017-05-06 LAB — POCT INR: INR: 7.9

## 2017-05-06 LAB — PROTIME-INR: INR: 5.7 — AB (ref 0.9–1.1)

## 2017-05-07 NOTE — Telephone Encounter (Signed)
Pt had INR check 5/16 scheduled for appt with coumadin clinic 5/23

## 2017-05-13 ENCOUNTER — Ambulatory Visit (INDEPENDENT_AMBULATORY_CARE_PROVIDER_SITE_OTHER): Payer: Medicare Other

## 2017-05-13 DIAGNOSIS — Z5181 Encounter for therapeutic drug level monitoring: Secondary | ICD-10-CM

## 2017-05-13 DIAGNOSIS — I340 Nonrheumatic mitral (valve) insufficiency: Secondary | ICD-10-CM

## 2017-05-13 LAB — POCT INR: INR: 4.2

## 2017-05-20 ENCOUNTER — Ambulatory Visit (INDEPENDENT_AMBULATORY_CARE_PROVIDER_SITE_OTHER): Payer: Medicare Other

## 2017-05-20 DIAGNOSIS — I340 Nonrheumatic mitral (valve) insufficiency: Secondary | ICD-10-CM

## 2017-05-20 DIAGNOSIS — Z5181 Encounter for therapeutic drug level monitoring: Secondary | ICD-10-CM

## 2017-05-20 DIAGNOSIS — I200 Unstable angina: Secondary | ICD-10-CM

## 2017-05-20 LAB — POCT INR: INR: 2.5

## 2017-05-21 ENCOUNTER — Encounter (HOSPITAL_COMMUNITY): Payer: Self-pay | Admitting: Internal Medicine

## 2017-05-21 ENCOUNTER — Ambulatory Visit (HOSPITAL_COMMUNITY)
Admission: RE | Admit: 2017-05-21 | Discharge: 2017-05-21 | Disposition: A | Discharge status: Home / Self Care | Payer: Medicare Other | Source: Ambulatory Visit | Attending: Internal Medicine | Admitting: Internal Medicine

## 2017-05-21 VITALS — BP 148/80 | HR 67 | Wt 142.5 lb

## 2017-05-21 DIAGNOSIS — Z7901 Long term (current) use of anticoagulants: Secondary | ICD-10-CM | POA: Diagnosis not present

## 2017-05-21 DIAGNOSIS — I252 Old myocardial infarction: Secondary | ICD-10-CM | POA: Insufficient documentation

## 2017-05-21 DIAGNOSIS — N184 Chronic kidney disease, stage 4 (severe): Secondary | ICD-10-CM | POA: Insufficient documentation

## 2017-05-21 DIAGNOSIS — E1122 Type 2 diabetes mellitus with diabetic chronic kidney disease: Secondary | ICD-10-CM | POA: Diagnosis not present

## 2017-05-21 DIAGNOSIS — I2510 Atherosclerotic heart disease of native coronary artery without angina pectoris: Secondary | ICD-10-CM | POA: Insufficient documentation

## 2017-05-21 DIAGNOSIS — I130 Hypertensive heart and chronic kidney disease with heart failure and stage 1 through stage 4 chronic kidney disease, or unspecified chronic kidney disease: Secondary | ICD-10-CM | POA: Insufficient documentation

## 2017-05-21 DIAGNOSIS — I5022 Chronic systolic (congestive) heart failure: Secondary | ICD-10-CM

## 2017-05-21 DIAGNOSIS — I480 Paroxysmal atrial fibrillation: Secondary | ICD-10-CM | POA: Insufficient documentation

## 2017-05-21 DIAGNOSIS — E119 Type 2 diabetes mellitus without complications: Secondary | ICD-10-CM | POA: Insufficient documentation

## 2017-05-21 DIAGNOSIS — Z86010 Personal history of colonic polyps: Secondary | ICD-10-CM | POA: Insufficient documentation

## 2017-05-21 DIAGNOSIS — Z953 Presence of xenogenic heart valve: Secondary | ICD-10-CM | POA: Diagnosis not present

## 2017-05-21 DIAGNOSIS — J3801 Paralysis of vocal cords and larynx, unilateral: Secondary | ICD-10-CM | POA: Insufficient documentation

## 2017-05-21 DIAGNOSIS — I340 Nonrheumatic mitral (valve) insufficiency: Secondary | ICD-10-CM | POA: Diagnosis not present

## 2017-05-21 DIAGNOSIS — H409 Unspecified glaucoma: Secondary | ICD-10-CM | POA: Diagnosis not present

## 2017-05-21 DIAGNOSIS — E785 Hyperlipidemia, unspecified: Secondary | ICD-10-CM | POA: Diagnosis not present

## 2017-05-21 DIAGNOSIS — Z951 Presence of aortocoronary bypass graft: Secondary | ICD-10-CM

## 2017-05-21 LAB — BASIC METABOLIC PANEL
ANION GAP: 5 (ref 5–15)
BUN: 27 mg/dL — AB (ref 6–20)
CO2: 26 mmol/L (ref 22–32)
Calcium: 9.6 mg/dL (ref 8.9–10.3)
Chloride: 108 mmol/L (ref 101–111)
Creatinine, Ser: 2.33 mg/dL — ABNORMAL HIGH (ref 0.44–1.00)
GFR calc Af Amer: 22 mL/min — ABNORMAL LOW (ref >60)
GFR calc non Af Amer: 19 mL/min — ABNORMAL LOW (ref >60)
Glucose, Bld: 108 mg/dL — ABNORMAL HIGH (ref 65–99)
POTASSIUM: 4.4 mmol/L (ref 3.5–5.1)
Sodium: 139 mmol/L (ref 135–145)

## 2017-05-21 MED ORDER — HYDRALAZINE HCL 50 MG PO TABS: 50.0000 mg | ORAL_TABLET | Freq: Three times a day (TID) | ORAL | 3 refills | Status: DC

## 2017-05-21 NOTE — Patient Instructions (Addendum)
Increase Hydralazine to 50 mg Three times a day   Labs today  You have been referred to Cardiac Rehab at [facility], they will contact you to scheduled  You have been referred to:   [facility]. ON Monday 06/22/17 AT 10:40 AM   [address], [place], [place] [address]      Phone: [phone]   They will mail you a new patient packet in the mail  Your physician recommends that you schedule a follow-up appointment in: 3 months with echocardiogram

## 2017-05-21 NOTE — Progress Notes (Signed)
Advanced Heart Failure Clinic Note   PCP: Dr. Ouida Sills Primary Cardiologist: Bensimhon  HPI:  Ms. Copus is a 82 year old female with a history of type 2 diabetes mellitus, hypertension, hyperlipidemia and CKD.   Admiited on 02/03/2017 with NSTEMI. Emergent cardiac catheterization which showed three-vessel coronary artery disease. She developed profound shock with multi-system organ failure and found to have severe MR due to partial flail of MV. Underwent emergent CABG x 4 (Coronary artery bypass grafting x 4   Left internal mammary graft to the LAD  Sequential SVG to diagonal and OM  SVG to PDA and bioprosthetic MVR   Had prolonged post-op course with renal failure, PAF (resolved with amio), paralyzed left vocal cord and HF. Post-op Echo with EF 40% RV normal, and stable MVR. ECHO 3/4 EF 20-25% RV mild-moderately reduced. Trivial pericardial effusion.   On d/c was 152 pounds.  Cr 2.3  She presents today for HF follow up. She is back at home, finished with home PT. Able to walk throughout her home without SOB, no SOB with stairs. Denies chest pain, orthopnea and PND. Weights at home 143-144 pounds. Eating a fairly low sodium diet, drinking more than 2L a day.   Review of systems complete and found to be negative unless listed in HPI.    SH:  Social History   Social History  . Marital status: Married    Spouse name: N/A  . Number of children: N/A  . Years of education: N/A   Occupational History  . Not on file.   Social History Main Topics  . Smoking status: Never Smoker  . Smokeless tobacco: Never Used  . Alcohol use No  . Drug use: No  . Sexual activity: Not on file   Other Topics Concern  . Not on file   Social History Narrative  . No narrative on file    FH:  Family History  Problem Relation Age of Onset  . Diabetes Father   . Diabetes Sister     Past Medical History:  Diagnosis Date  . [name]    a. LHC 02/03/17 for unstable angina  showed severe 3v CAD w/ sequential 70% & 80% p-mLAD, 99% mLCx, CTO mRCA, elevated LV filling pressure  . Colon polyp 2010  . Diabetes mellitus with complication ([place])   . Glaucoma   . Hypertension     Current Outpatient Prescriptions  Medication Sig Dispense Refill  . acetaminophen (TYLENOL) 500 MG tablet Take 500 mg by mouth every 6 (six) hours as needed for mild pain.     Marland Kitchen amiodarone (PACERONE) 200 MG tablet Take 1 tablet (200 mg total) by mouth daily. 14 tablet 0  . atorvastatin (LIPITOR) 80 MG tablet Take 1 tablet (80 mg total) by mouth daily at 6 PM. 14 tablet 0  . Calcium Carbonate-Vitamin D (CALCIUM + D PO) Take 1 tablet by mouth daily.    . carvedilol (COREG) 3.125 MG tablet Take 1 tablet (3.125 mg total) by mouth 2 (two) times daily. 28 tablet 0  . furosemide (LASIX) 40 MG tablet Take 1 tablet (40 mg total) by mouth daily. 14 tablet 0  . hydrALAZINE (APRESOLINE) 25 MG tablet Take 1.5 tablets (37.5 mg total) by mouth 3 (three) times daily. 63 tablet 0  . isosorbide mononitrate (IMDUR) 30 MG 24 hr tablet Take 1 tablet (30 mg total) by mouth daily. 14 tablet 0  . latanoprost (XALATAN) 0.005 % ophthalmic solution Place 1 drop into both eyes at bedtime.     Marland Kitchen  spironolactone (ALDACTONE) 25 MG tablet Take 1 tablet (25 mg total) by mouth daily. 14 tablet 0  . warfarin (COUMADIN) 1 MG tablet Take 2 tablets daily or As Directed 60 tablet 1   No current facility-administered medications for this encounter.     Vitals:   05/21/17 1417  BP: (!) 148/80  Pulse: 67  SpO2: 100%  Weight: 142 lb 8 oz (64.6 kg)    Wt Readings from Last 3 Encounters:  05/21/17 142 lb 8 oz (64.6 kg)  04/13/17 139 lb 3.2 oz (63.1 kg)  03/30/17 152 lb 3.2 oz (69 kg)    PHYSICAL EXAM:  General:  Well appearing female, NAD.  HEENT: Normal.  Neck: Supple. No JVP.  Carotids 2+ bilat; no bruits. No thyromegaly or nodule noted.   Cor: PMI normal. Regular rate and rhythm. No M/G/R noted. Sternal incision healing  well. Lungs: Clear bilaterally, normal effort.  Abdomen: Soft, NT, ND, no HSM. No bruits or masses. Bowel sounds present.  Extremities: No cyanosis, clubbing, or rash. No edema.    Neuro: Alert & oriented x 3. Cranial nerves grossly intact. Moves all 4 extremities w/o difficulty. Affect pleasant    ASSESSMENT & PLAN:  1. NSTEMI with CAD [name] on 2/15 - Overall continues to improve.  No further ischemic symptoms.  - No ASA due to stable CAD on coumadin.  - Continue atoravastatin.  - Refer to cardiac rehab.   - Continue Coreg 3.125 mg BID. Will not increase as HR is 67, 64 at the time of my exam.  2. Severe MR with flail segment s/p MVR c/ bioprosthetic valve. - Stable. Continue coumadin.  - Knows she will need SBE prophylaxis with any dental procedures.  - Will need repeat Echo next month.  3. Chronic systolic HF :  - Echo 1/61 EF 40% MVR ok. Echo 3/5 EF 20-25% - NYHA II - Volume status stable. Continue lasix 40 mg daily. Weight stable.  - Increase hydralazine to 50 mg TID.   - Continue imdur 30 mg daily.  - Continue carvedilol 3.125 bid - Renal function prohibits Entresto initiation  - Will repeat Echo next month.  - Reinforced fluid restriction to < 2 L daily, sodium restriction to less than 2000 mg daily, and the importance of daily weights.   4. CKD stage 4 - BMET today.  - Will refer to nephrology in [place].  5. PAF - NSR on exam. - Continue amiodarone 200 mg daily.  - LFT's ok in March.  6. L vocal cord paralysis - Resolved.  7. DM2 - Follows with primary care.   Refer to cardiac rehab at [facility], BMET today. Follow up in 3 months with an Echo.   Arbutus Leas, NP  2:26 PM   Patient seen and examined with Jettie Booze, NP. We discussed all aspects of the encounter. I agree with the assessment and plan as stated above.   She is doing well s/p NSTEMI and MVR. NYHA II. However EF remains depressed. BP up. Will increase hydralazine to 50 tid. Refer to cardiac  rehab and nephrology.   Repeat echo in several months. If EF <= 35% will need ICD.  Glori Bickers, MD  3:54 PM

## 2017-05-26 ENCOUNTER — Other Ambulatory Visit: Payer: Self-pay | Admitting: Surgery

## 2017-05-26 DIAGNOSIS — Z951 Presence of aortocoronary bypass graft: Secondary | ICD-10-CM

## 2017-05-27 ENCOUNTER — Ambulatory Visit
Admission: RE | Admit: 2017-05-27 | Discharge: 2017-05-27 | Disposition: A | Discharge status: Home / Self Care | Payer: Medicare Other | Source: Ambulatory Visit | Attending: Surgery | Admitting: Surgery

## 2017-05-27 ENCOUNTER — Ambulatory Visit (INDEPENDENT_AMBULATORY_CARE_PROVIDER_SITE_OTHER): Payer: Self-pay | Admitting: Surgery

## 2017-05-27 ENCOUNTER — Encounter: Payer: Self-pay | Admitting: Surgery

## 2017-05-27 ENCOUNTER — Ambulatory Visit (INDEPENDENT_AMBULATORY_CARE_PROVIDER_SITE_OTHER): Payer: Medicare Other | Admitting: *Deleted

## 2017-05-27 VITALS — BP 125/67 | HR 64 | Resp 20 | Ht 66.0 in | Wt 142.0 lb

## 2017-05-27 DIAGNOSIS — I340 Nonrheumatic mitral (valve) insufficiency: Secondary | ICD-10-CM | POA: Diagnosis not present

## 2017-05-27 DIAGNOSIS — I2510 Atherosclerotic heart disease of native coronary artery without angina pectoris: Secondary | ICD-10-CM

## 2017-05-27 DIAGNOSIS — Z5181 Encounter for therapeutic drug level monitoring: Secondary | ICD-10-CM | POA: Diagnosis not present

## 2017-05-27 DIAGNOSIS — I200 Unstable angina: Secondary | ICD-10-CM

## 2017-05-27 DIAGNOSIS — Z951 Presence of aortocoronary bypass graft: Secondary | ICD-10-CM

## 2017-05-27 DIAGNOSIS — Z952 Presence of prosthetic heart valve: Secondary | ICD-10-CM

## 2017-05-27 LAB — POCT INR: INR: 2.1

## 2017-05-27 MED ORDER — WARFARIN SODIUM 1 MG PO TABS
ORAL_TABLET | ORAL | 1 refills | Status: DC
Start: 2017-05-27 — End: 2017-09-23

## 2017-05-27 NOTE — Progress Notes (Signed)
HPI:  The patient returns for follow up s/p emergent CABG x 4 and MV replacement with a 25 mm pericardial valve for NSTEMI with a ruptured papillary muscle with severe MR and profound shock with multi-system organ failure on 02/05/2017. She had a difficult postop course with renal failure, PAF, heart failure and a paralyzed left vocal cord. She was seen preop by Dr. Haroldine Laws and has been followed by him postop. She saw him in clinic about a week ago. She says that she is feeling much better. She is walking well with a cane for balance. She denies any shortness of breath or chest pain. She has not had any ankle edema. Her weight is stable and she has no orthopnea. Her last echo on 02/22/2017 showed an LVEF of 20-25% with mild to moderate RV dysfunction.   Current Outpatient Prescriptions  Medication Sig Dispense Refill  . acetaminophen (TYLENOL) 500 MG tablet Take 500 mg by mouth every 6 (six) hours as needed for mild pain.     Marland Kitchen amiodarone (PACERONE) 200 MG tablet Take 1 tablet (200 mg total) by mouth daily. 14 tablet 0  . atorvastatin (LIPITOR) 80 MG tablet Take 1 tablet (80 mg total) by mouth daily at 6 PM. 14 tablet 0  . Calcium Carbonate-Vitamin D (CALCIUM + D PO) Take 1 tablet by mouth daily.    . carvedilol (COREG) 3.125 MG tablet Take 1 tablet (3.125 mg total) by mouth 2 (two) times daily. 28 tablet 0  . furosemide (LASIX) 40 MG tablet Take 1 tablet (40 mg total) by mouth daily. 14 tablet 0  . hydrALAZINE (APRESOLINE) 50 MG tablet Take 1 tablet (50 mg total) by mouth 3 (three) times daily. 90 tablet 3  . isosorbide mononitrate (IMDUR) 30 MG 24 hr tablet Take 1 tablet (30 mg total) by mouth daily. 14 tablet 0  . latanoprost (XALATAN) 0.005 % ophthalmic solution Place 1 drop into both eyes at bedtime.     Marland Kitchen spironolactone (ALDACTONE) 25 MG tablet Take 1 tablet (25 mg total) by mouth daily. 14 tablet 0  . warfarin (COUMADIN) 1 MG tablet Take 2 tablets daily or As Directed 60 tablet 1   No  current facility-administered medications for this visit.      Physical Exam: BP 125/67   Pulse 64   Resp 20   Ht 5\' 6"  (1.676 m)   Wt 142 lb (64.4 kg)   SpO2 97% Comment: RA  BMI 22.92 kg/m  She looks well. Lung exam is clear. Cardiac exam shows a regular rate and rhythm with normal heart sounds and no murmur Chest incision is healing well and sternum is stable. The leg incisions are healing well and there is no peripheral edema.    Diagnostic Tests:  CLINICAL DATA:  Coronary artery disease post CABG in February 2018, diabetes mellitus, hypertension, MVR  EXAM: CHEST  2 VIEW  COMPARISON:  03/30/2017  FINDINGS: Enlargement of cardiac silhouette post CABG and MVR.  Calcified tortuous aorta.  Slight pulmonary vascular congestion.  Lungs hyperinflated but clear.  Resolution of bibasilar opacities seen on the previous exam.  No pulmonary infiltrate, pleural effusion or pneumothorax.  Bones appear diffusely demineralized.  IMPRESSION: Enlargement of cardiac silhouette post CABG and MVR with slight pulmonary vascular congestion.  Aortic atherosclerosis  Hyperinflated lungs with interval resolution of bibasilar opacities identified on the previous study.  No acute abnormalities.   Electronically Signed   By: Lavonia Dana M.D.   On: 05/27/2017 09:59  Impression:  Overall I think she looks great for what she has been through at age 82. Her creat has gradually improved and was 2.33 on 05/21/2017. She is being seen by a nephrologist next week. She is going to continue to follow up with Dr. Haroldine Laws and will have a repeat echo is a couple months to reevaluate her LV function.  She only needs to be on Coumadin for 3 months postop for the bioprosthetic mitral valve but may benefit from staying on it as long as she tolerates it with PAF, a mitral valve and MI. I encouraged her to continue increasing her activity. She asked me about bringing her  husband who has dementia home from the nursing home so that she could care for him but I think that would be a lot for her to do at this point.   Plan:  She will continue to follow up with her PCP and Dr. Haroldine Laws. I will be happy to see her back if the need arises.   Gaye Pollack, MD [facility] [phone]

## 2017-06-03 ENCOUNTER — Ambulatory Visit (INDEPENDENT_AMBULATORY_CARE_PROVIDER_SITE_OTHER): Payer: Medicare Other

## 2017-06-03 DIAGNOSIS — I340 Nonrheumatic mitral (valve) insufficiency: Secondary | ICD-10-CM

## 2017-06-03 DIAGNOSIS — I200 Unstable angina: Secondary | ICD-10-CM

## 2017-06-03 DIAGNOSIS — Z5181 Encounter for therapeutic drug level monitoring: Secondary | ICD-10-CM | POA: Diagnosis not present

## 2017-06-03 LAB — POCT INR: INR: 2.4

## 2017-06-17 ENCOUNTER — Ambulatory Visit (INDEPENDENT_AMBULATORY_CARE_PROVIDER_SITE_OTHER): Payer: Medicare Other

## 2017-06-17 DIAGNOSIS — I200 Unstable angina: Secondary | ICD-10-CM

## 2017-06-17 DIAGNOSIS — I340 Nonrheumatic mitral (valve) insufficiency: Secondary | ICD-10-CM | POA: Diagnosis not present

## 2017-06-17 DIAGNOSIS — Z5181 Encounter for therapeutic drug level monitoring: Secondary | ICD-10-CM

## 2017-06-17 LAB — POCT INR: INR: 2.9

## 2017-06-28 ENCOUNTER — Inpatient Hospital Stay
Admission: EM | Admit: 2017-06-28 | Discharge: 2017-06-30 | DRG: 641 | Disposition: A | Discharge status: Home / Self Care | Payer: Medicare Other | Attending: Internal Medicine | Admitting: Internal Medicine

## 2017-06-28 ENCOUNTER — Emergency Department: Payer: Medicare Other

## 2017-06-28 DIAGNOSIS — N184 Chronic kidney disease, stage 4 (severe): Secondary | ICD-10-CM | POA: Diagnosis present

## 2017-06-28 DIAGNOSIS — Z833 Family history of diabetes mellitus: Secondary | ICD-10-CM

## 2017-06-28 DIAGNOSIS — Z7901 Long term (current) use of anticoagulants: Secondary | ICD-10-CM

## 2017-06-28 DIAGNOSIS — Z952 Presence of prosthetic heart valve: Secondary | ICD-10-CM

## 2017-06-28 DIAGNOSIS — I4891 Unspecified atrial fibrillation: Secondary | ICD-10-CM | POA: Diagnosis present

## 2017-06-28 DIAGNOSIS — R270 Ataxia, unspecified: Secondary | ICD-10-CM

## 2017-06-28 DIAGNOSIS — I2510 Atherosclerotic heart disease of native coronary artery without angina pectoris: Secondary | ICD-10-CM | POA: Diagnosis present

## 2017-06-28 DIAGNOSIS — E860 Dehydration: Secondary | ICD-10-CM | POA: Diagnosis present

## 2017-06-28 DIAGNOSIS — R531 Weakness: Secondary | ICD-10-CM

## 2017-06-28 DIAGNOSIS — I129 Hypertensive chronic kidney disease with stage 1 through stage 4 chronic kidney disease, or unspecified chronic kidney disease: Secondary | ICD-10-CM | POA: Diagnosis present

## 2017-06-28 DIAGNOSIS — E1122 Type 2 diabetes mellitus with diabetic chronic kidney disease: Secondary | ICD-10-CM | POA: Diagnosis present

## 2017-06-28 DIAGNOSIS — E785 Hyperlipidemia, unspecified: Secondary | ICD-10-CM | POA: Diagnosis present

## 2017-06-28 DIAGNOSIS — N179 Acute kidney failure, unspecified: Secondary | ICD-10-CM | POA: Diagnosis present

## 2017-06-28 DIAGNOSIS — H409 Unspecified glaucoma: Secondary | ICD-10-CM | POA: Diagnosis present

## 2017-06-28 DIAGNOSIS — Z951 Presence of aortocoronary bypass graft: Secondary | ICD-10-CM

## 2017-06-28 LAB — CBC
HEMATOCRIT: 36.7 % (ref 35.0–47.0)
HEMOGLOBIN: 12 g/dL (ref 12.0–16.0)
MCH: 25.7 pg — ABNORMAL LOW (ref 26.0–34.0)
MCHC: 32.7 g/dL (ref 32.0–36.0)
MCV: 78.8 fL — ABNORMAL LOW (ref 80.0–100.0)
Platelets: 185 10*3/uL (ref 150–440)
RBC: 4.66 MIL/uL (ref 3.80–5.20)
RDW: 22.7 % — ABNORMAL HIGH (ref 11.5–14.5)
WBC: 6.1 10*3/uL (ref 3.6–11.0)

## 2017-06-28 LAB — BASIC METABOLIC PANEL
ANION GAP: 6 (ref 5–15)
BUN: 39 mg/dL — ABNORMAL HIGH (ref 6–20)
CALCIUM: 13.8 mg/dL — AB (ref 8.9–10.3)
CO2: 26 mmol/L (ref 22–32)
Chloride: 105 mmol/L (ref 101–111)
Creatinine, Ser: 2.73 mg/dL — ABNORMAL HIGH (ref 0.44–1.00)
GFR, EST AFRICAN AMERICAN: 18 mL/min — AB (ref >60)
GFR, EST NON AFRICAN AMERICAN: 15 mL/min — AB (ref >60)
GLUCOSE: 160 mg/dL — AB (ref 65–99)
POTASSIUM: 4.2 mmol/L (ref 3.5–5.1)
SODIUM: 137 mmol/L (ref 135–145)

## 2017-06-28 LAB — PROTIME-INR
INR: 2.78
PROTHROMBIN TIME: 29.9 s — AB (ref 11.4–15.2)

## 2017-06-28 LAB — TROPONIN I: TROPONIN I: 0.06 ng/mL — AB (ref <0.03)

## 2017-06-28 MED ORDER — SODIUM CHLORIDE 0.9 % IV BOLUS (SEPSIS)
1000.0000 mL | Freq: Once | INTRAVENOUS | Status: AC
  Administered 2017-06-28: 1000 mL via INTRAVENOUS

## 2017-06-28 NOTE — ED Provider Notes (Signed)
[facility] Emergency Department Provider Note  Time seen: 9:45 PM  I have reviewed the triage vital signs and the nursing notes.   HISTORY  Chief Complaint Weakness (right side leg and right side arm)    HPI [name] [name] is a 82 y.o. female with a past medicalhistory of diabetes, hypertension, CK D, CABG, atrial fibrillation on Coumadin, who presents to the emergency department for generalized weakness. According to the patient for the past 3 days she has been feeling weak which she describes as bilateral lower extremity weakness. States she is unsteady walking and feels like she is going to fall due to the weakness. Patient states on the ride over here in the ambulance she was also experiencing some right arm weakness. Currently denies any arm weakness states just feels weak in both of her lower extremities. Denies any one extremity feeling more weak than the other. Patient otherwise denies any other symptoms. Negative for cough, congestion, fever, chest pain, shortness breath, abdominal pain, nausea, vomiting. Did state intermittent loose stools. Negative for dysuria or hematuria. Negative for headache.  Past Medical History:  Diagnosis Date  . [name]    a. LHC 02/03/17 for unstable angina showed severe 3v CAD w/ sequential 70% & 80% p-mLAD, 99% mLCx, CTO mRCA, elevated LV filling pressure  . Colon polyp 2010  . Diabetes mellitus with complication ([place])   . Glaucoma   . Hypertension     Patient Active Problem List   Diagnosis Date Noted  . PAF (paroxysmal atrial fibrillation) ([place]) 03/26/2017  . CKD (chronic kidney disease), stage IV ([place]) 03/26/2017  . [name]   . [name] 02/05/2017  . Unstable angina ([place]) 02/03/2017  . [name] 02/03/2017  . Murmur 02/03/2017  . Type 2 diabetes mellitus with complication ([place]) 21/19/4174  . Essential hypertension 02/03/2017  . Absolute anemia 09/19/2014   . Encounter for screening colonoscopy 09/19/2014    Past Surgical History:  Procedure Laterality Date  . APPENDECTOMY    . BREAST BIOPSY Right 1999  . COLONOSCOPY  2010  . CORONARY ARTERY BYPASS GRAFT N/A 02/05/2017   Procedure: CORONARY ARTERY BYPASS GRAFTING (CABG) x 4                                                                               LIMA-LAD SEQ SVG-DIAG-OM SVG-PD;  Surgeon: Gaye Pollack, MD;  Location: [facility];  Service: Open Heart Surgery;  Laterality: N/A;  . IABP INSERTION N/A 02/04/2017   Procedure: IABP Insertion;  Surgeon: Leonie Man, MD;  Location: [facility];  Service: Cardiovascular;  Laterality: N/A;  . LEFT HEART CATH AND CORONARY ANGIOGRAPHY Left 02/03/2017   Procedure: Left Heart Cath and Coronary Angiography;  Surgeon: Nelva Bush, MD;  Location: [facility];  Service: Cardiovascular;  Laterality: Left;  . MITRAL VALVE REPAIR N/A 02/05/2017   Procedure: MITRAL VALVE REPLACEMENT (MVR) WITH MAGNA MITRAL EASE PERICARDIAL BIOPROSTHESIS 25 MM;  Surgeon: Gaye Pollack, MD;  Location: [facility];  Service: Open Heart Surgery;  Laterality: N/A;  . PARATHYROIDECTOMY     1986  . RIGHT HEART CATH N/A  02/04/2017   Procedure: Right Heart Cath;  Surgeon: Leonie Man, MD;  Location: [facility];  Service: Cardiovascular;  Laterality: N/A;  . TEE WITHOUT CARDIOVERSION N/A 02/05/2017   Procedure: TRANSESOPHAGEAL ECHOCARDIOGRAM (TEE);  Surgeon: Gaye Pollack, MD;  Location: [facility];  Service: Open Heart Surgery;  Laterality: N/A;    Prior to Admission medications   Medication Sig Start Date End Date Taking? Authorizing Provider  acetaminophen (TYLENOL) 500 MG tablet Take 500 mg by mouth every 6 (six) hours as needed for mild pain.     [provider]  amiodarone (PACERONE) 200 MG tablet Take 1 tablet (200 mg total) by mouth daily. 04/16/17   Bensimhon, Shaune Pascal, MD  atorvastatin (LIPITOR) 80 MG tablet Take 1 tablet (80 mg total) by mouth  daily at 6 PM. 04/16/17   Bensimhon, Shaune Pascal, MD  Calcium Carbonate-Vitamin D (CALCIUM + D PO) Take 1 tablet by mouth daily.    [provider]  carvedilol (COREG) 3.125 MG tablet Take 1 tablet (3.125 mg total) by mouth 2 (two) times daily. 04/16/17 07/15/17  Bensimhon, Shaune Pascal, MD  furosemide (LASIX) 40 MG tablet Take 1 tablet (40 mg total) by mouth daily. 04/16/17   Bensimhon, Shaune Pascal, MD  hydrALAZINE (APRESOLINE) 50 MG tablet Take 1 tablet (50 mg total) by mouth 3 (three) times daily. 05/21/17   Bensimhon, Shaune Pascal, MD  isosorbide mononitrate (IMDUR) 30 MG 24 hr tablet Take 1 tablet (30 mg total) by mouth daily. 04/16/17   Bensimhon, Shaune Pascal, MD  latanoprost (XALATAN) 0.005 % ophthalmic solution Place 1 drop into both eyes at bedtime.     [provider]  spironolactone (ALDACTONE) 25 MG tablet Take 1 tablet (25 mg total) by mouth daily. 04/16/17   Bensimhon, Shaune Pascal, MD  warfarin (COUMADIN) 1 MG tablet Take 2 tablets daily or As Directed 05/27/17   Bensimhon, Shaune Pascal, MD    No Known Allergies  Family History  Problem Relation Age of Onset  . Diabetes Father   . Diabetes Sister     Social History Social History  Substance Use Topics  . Smoking status: Never Smoker  . Smokeless tobacco: Never Used  . Alcohol use No    Review of Systems Constitutional: Negative for fever. Eyes: Negative for visual changes. ENT: Negative for congestion Cardiovascular: Negative for chest pain. Respiratory: Negative for shortness of breath. Gastrointestinal: Negative for abdominal pain, vomiting. Some loose stool. Genitourinary: Negative for dysuria. Musculoskeletal: Negative for back pain. Skin: Negative for rash. Neurological: Negative for headache. Positive for bilateral lower extremity weakness. All other ROS negative  ____________________________________________   PHYSICAL EXAM:  VITAL SIGNS: ED Triage Vitals [06/28/17 2136]  Enc Vitals Group     BP      Pulse Rate  70     Resp 18     Temp 98.2 F (36.8 C)     Temp Source Oral     SpO2 99 %     Weight 140 lb (63.5 kg)     Height 5\' 7"  (1.702 m)     Head Circumference      Peak Flow      Pain Score      Pain Loc      Pain Edu?      Excl. in [place]?     Constitutional: Alert and oriented. Well appearing and in no distress. Eyes: Normal exam ENT   Head: Normocephalic and atraumatic   Mouth/Throat: Mucous membranes are  moist. Cardiovascular: Normal rate, regular rhythm. No murmur Respiratory: Normal respiratory effort without tachypnea nor retractions. Breath sounds are clear Gastrointestinal: Soft and nontender. No distention.   Musculoskeletal: Nontender with normal range of motion in all extremities. No lower extremity tenderness or edema. Neurologic:  Normal speech and language. Equal grip strengths. 5/5 motor in upper extremities 4/5 motor in bilateral lower extremities. No pronator drift. No focal weakness identified. Cranial nerves intact. Skin:  Skin is warm, dry and intact.  Psychiatric: Mood and affect are normal. Speech and behavior are normal.   ____________________________________________   RADIOLOGY  CT head negative abnormality patient does have several old infarcts noted.  ____________________________________________   INITIAL IMPRESSION / ASSESSMENT AND PLAN / ED COURSE  Pertinent labs & imaging results that were available during my care of the patient were reviewed by me and considered in my medical decision making (see chart for details).  The patient presents to the emergency department with 3-4 days of generalized weakness in her lower extremities and difficulty ambulating due to feeling weak. We will check labs, CT scan of the head and continue to closely monitor in the emergency department overall the patient appears well. On neurological exam she has slight weakness in bilateral lower extremities but no unilateral symptoms. Denies any back pain or numbness.  CT  negative for acute infarct but there are several old infarcts noted. Patient's labs are resulted with a significantly elevated calcium which appears to be acute compared to old labs. We'll begin IV hydration and admitted to the hospital for further treatment.  ____________________________________________   FINAL CLINICAL IMPRESSION(S) / ED DIAGNOSES  Lower extremity weakness Hypercalcemia   Harvest Dark, MD 06/28/17 2246

## 2017-06-28 NOTE — ED Triage Notes (Signed)
Pt presents to ED from home via EMS c/o right side arm weakness and right leg weakness since thursday

## 2017-06-28 NOTE — H&P (Signed)
History and Physical   [facility] - [place] @ [facility] Admission History and Physical [employer], D.O.    Patient Name: [name] [name] MR#: [id number] Date of Birth: 1935-12-18 Date of Admission: 06/28/2017  Referring MD/NP/PA: Dr. Kerman Passey Primary Care Physician: Kirk Ruths, MD   Chief Complaint:  Chief Complaint  Patient presents with  . Weakness    right side leg and right side arm    HPI: [name] [name] is a 82 y.o. female with a past medicalhistory of diabetes, hypertension, CKD, CAD s/p CABG, atrial fibrillation on Coumadin, who presents to the emergency department for three days history of generalized weakness associated with bilaterally lower extremity edema.  Weakness has been interfering with functional capacity and ADLs.  She also reports feeling "groggy, not like myself."   Patient denies fevers/chills, dizziness, chest pain, shortness of breath, N/V/C/D, abdominal pain, dysuria/frequency, changes in mental status.   There is no bone or muscle pain, urinary or psychiatric complaints.    Otherwise there has been no change in status.  She has been taking her medications as prescribed. No change in her diet.    EMS/ED Course: Patient received NS. Medical admission was requested for further management of weakness secondary to hypercalcemia of unknown origin.   Review of Systems:  CONSTITUTIONAL: Positive weakness. No fever/chills, fatigue,  weight gain/loss, headache. EYES: No blurry or double vision. ENT: No tinnitus, postnasal drip, redness or soreness of the oropharynx. RESPIRATORY: No cough, dyspnea, wheeze.  No hemoptysis.  CARDIOVASCULAR: No chest pain, palpitations, syncope, orthopnea. Positive lower extremity edema.  GASTROINTESTINAL: No nausea, vomiting, abdominal pain, diarrhea, constipation.  No hematemesis, melena or hematochezia. GENITOURINARY: No dysuria, frequency, hematuria. ENDOCRINE: No polyuria or nocturia. No heat or cold  intolerance. HEMATOLOGY: No anemia, bruising, bleeding. INTEGUMENTARY: No rashes, ulcers, lesions. MUSCULOSKELETAL: No muscle or bone pain. No arthritis, gout, dyspnea. NEUROLOGIC: No numbness, tingling, ataxia, seizure-type activity, weakness. PSYCHIATRIC: No anxiety, depression, insomnia.   Past Medical History:  Diagnosis Date  . [name]    a. LHC 02/03/17 for unstable angina showed severe 3v CAD w/ sequential 70% & 80% p-mLAD, 99% mLCx, CTO mRCA, elevated LV filling pressure  . Colon polyp 2010  . Diabetes mellitus with complication ([place])   . Glaucoma   . Hypertension     Past Surgical History:  Procedure Laterality Date  . APPENDECTOMY    . BREAST BIOPSY Right 1999  . COLONOSCOPY  2010  . CORONARY ARTERY BYPASS GRAFT N/A 02/05/2017   Procedure: CORONARY ARTERY BYPASS GRAFTING (CABG) x 4                                                                               LIMA-LAD SEQ SVG-DIAG-OM SVG-PD;  Surgeon: Gaye Pollack, MD;  Location: [facility];  Service: Open Heart Surgery;  Laterality: N/A;  . IABP INSERTION N/A 02/04/2017   Procedure: IABP Insertion;  Surgeon: Leonie Man, MD;  Location: [facility];  Service: Cardiovascular;  Laterality: N/A;  . LEFT HEART CATH AND CORONARY ANGIOGRAPHY Left 02/03/2017   Procedure: Left Heart Cath and Coronary Angiography;  Surgeon: Nelva Bush, MD;  Location: [facility];  Service: Cardiovascular;  Laterality: Left;  . MITRAL VALVE REPAIR N/A 02/05/2017   Procedure: MITRAL VALVE REPLACEMENT (MVR) WITH MAGNA MITRAL EASE PERICARDIAL BIOPROSTHESIS 25 MM;  Surgeon: Gaye Pollack, MD;  Location: [facility];  Service: Open Heart Surgery;  Laterality: N/A;  . PARATHYROIDECTOMY     1986  . RIGHT HEART CATH N/A 02/04/2017   Procedure: Right Heart Cath;  Surgeon: Leonie Man, MD;  Location: [facility];  Service: Cardiovascular;  Laterality: N/A;  . TEE WITHOUT CARDIOVERSION N/A 02/05/2017   Procedure:  TRANSESOPHAGEAL ECHOCARDIOGRAM (TEE);  Surgeon: Gaye Pollack, MD;  Location: [facility];  Service: Open Heart Surgery;  Laterality: N/A;     reports that she has never smoked. She has never used smokeless tobacco. She reports that she does not drink alcohol or use drugs.  No Known Allergies  Family History  Problem Relation Age of Onset  . Diabetes Father   . Diabetes Sister     Prior to Admission medications   Medication Sig Start Date End Date Taking? Authorizing Provider  acetaminophen (TYLENOL) 500 MG tablet Take 500 mg by mouth every 6 (six) hours as needed for mild pain.    Yes [provider]  amiodarone (PACERONE) 200 MG tablet Take 1 tablet (200 mg total) by mouth daily. 04/16/17  Yes Bensimhon, Shaune Pascal, MD  atorvastatin (LIPITOR) 80 MG tablet Take 1 tablet (80 mg total) by mouth daily at 6 PM. 04/16/17  Yes Bensimhon, Shaune Pascal, MD  carvedilol (COREG) 3.125 MG tablet Take 1 tablet (3.125 mg total) by mouth 2 (two) times daily. 04/16/17 07/15/17 Yes Bensimhon, Shaune Pascal, MD  furosemide (LASIX) 40 MG tablet Take 1 tablet (40 mg total) by mouth daily. 04/16/17  Yes Bensimhon, Shaune Pascal, MD  hydrALAZINE (APRESOLINE) 50 MG tablet Take 1 tablet (50 mg total) by mouth 3 (three) times daily. 05/21/17  Yes Bensimhon, Shaune Pascal, MD  isosorbide mononitrate (IMDUR) 30 MG 24 hr tablet Take 1 tablet (30 mg total) by mouth daily. 04/16/17  Yes Bensimhon, Shaune Pascal, MD  latanoprost (XALATAN) 0.005 % ophthalmic solution Place 1 drop into both eyes at bedtime.    Yes [provider]  spironolactone (ALDACTONE) 25 MG tablet Take 1 tablet (25 mg total) by mouth daily. 04/16/17  Yes Bensimhon, Shaune Pascal, MD  timolol (BETIMOL) 0.5 % ophthalmic solution Place 1 drop into both eyes 2 (two) times daily.   Yes [provider]  warfarin (COUMADIN) 1 MG tablet Take 2 tablets daily or As Directed Patient taking differently: Take 1-2 mg by mouth See admin instructions. Take 1 mg on Tuesday,  Thursday, Saturday and Sunday. Take 2 mg on Monday, Wednesday and Friday. 05/27/17  Yes Bensimhon, Shaune Pascal, MD    Physical Exam: Vitals:   06/28/17 2136  Pulse: 70  Resp: 18  Temp: 98.2 F (36.8 C)  TempSrc: Oral  SpO2: 99%  Weight: 63.5 kg (140 lb)  Height: 5\' 7"  (1.702 m)    GENERAL: 82 y.o.-year-old Female patient, well-developed, well-nourished lying in the bed in no acute distress.  Pleasant and cooperative.   HEENT: Head atraumatic, normocephalic. Pupils equal, round, reactive to light and accommodation. No scleral icterus. Extraocular muscles intact.  Mucus membranes moist. NECK: Supple, full range of motion. No JVD, no bruit heard. No thyroid enlargement, no tenderness, no cervical lymphadenopathy. CHEST: Normal breath sounds bilaterally. No wheezing, rales, rhonchi or crackles. No use of accessory muscles of respiration.  No reproducible chest wall tenderness.  CARDIOVASCULAR: S1, S2 normal. No murmurs, rubs, or gallops. Cap refill <2 seconds. Pulses intact distally.  ABDOMEN: Soft, nondistended, nontender. No rebound, guarding, rigidity. Normoactive bowel sounds present in all four quadrants. No organomegaly or mass. EXTREMITIES: No pedal edema, cyanosis, or clubbing. No calf tenderness or Homan's sign.  NEUROLOGIC: The patient is alert and oriented x 3. Cranial nerves II through XII are grossly intact with no focal sensorimotor deficit. PSYCHIATRIC:  Normal affect, mood, thought content. SKIN: Warm, dry, and intact without obvious rash, lesion, or ulcer.    Labs on Admission:  CBC:  Recent Labs Lab 06/28/17 2134  WBC 6.1  HGB 12.0  HCT 36.7  MCV 78.8*  PLT 177   Basic Metabolic Panel:  Recent Labs Lab 06/28/17 2134  NA 137  K 4.2  CL 105  CO2 26  GLUCOSE 160*  BUN 39*  CREATININE 2.73*  CALCIUM 13.8*   GFR: Estimated Creatinine Clearance: 16.2 mL/min (A) (by C-G formula based on SCr of 2.73 mg/dL (H)). Liver Function Tests: No results for input(s):  AST, ALT, ALKPHOS, BILITOT, PROT, ALBUMIN in the last 168 hours. No results for input(s): LIPASE, AMYLASE in the last 168 hours. No results for input(s): AMMONIA in the last 168 hours. Coagulation Profile:  Recent Labs Lab 06/28/17 2134  INR 2.78   Cardiac Enzymes:  Recent Labs Lab 06/28/17 2134  TROPONINI 0.06*   BNP (last 3 results) No results for input(s): PROBNP in the last 8760 hours. HbA1C: No results for input(s): HGBA1C in the last 72 hours. CBG: No results for input(s): GLUCAP in the last 168 hours. Lipid Profile: No results for input(s): CHOL, HDL, LDLCALC, TRIG, CHOLHDL, LDLDIRECT in the last 72 hours. Thyroid Function Tests: No results for input(s): TSH, T4TOTAL, FREET4, T3FREE, THYROIDAB in the last 72 hours. Anemia Panel: No results for input(s): VITAMINB12, FOLATE, FERRITIN, TIBC, IRON, RETICCTPCT in the last 72 hours. Urine analysis:    Component Value Date/Time   COLORURINE AMBER (A) 02/12/2017 0753   APPEARANCEUR CLOUDY (A) 02/12/2017 0753   LABSPEC 1.017 02/12/2017 0753   PHURINE 5.0 02/12/2017 0753   GLUCOSEU NEGATIVE 02/12/2017 0753   HGBUR MODERATE (A) 02/12/2017 0753   BILIRUBINUR NEGATIVE 02/12/2017 0753   KETONESUR NEGATIVE 02/12/2017 0753   PROTEINUR 30 (A) 02/12/2017 0753   NITRITE NEGATIVE 02/12/2017 0753   LEUKOCYTESUR LARGE (A) 02/12/2017 0753   Sepsis Labs: @LABRCNTIP (procalcitonin:4,lacticidven:4) )No results found for this or any previous visit (from the past 240 hour(s)).   Radiological Exams on Admission: Ct Head Wo Contrast  Result Date: 06/28/2017 CLINICAL DATA:  RIGHT extremity weakness for 3 days, ataxia. History of hypertension and diabetes. EXAM: CT HEAD WITHOUT CONTRAST TECHNIQUE: Contiguous axial images were obtained from the base of the skull through the vertex without intravenous contrast. COMPARISON:  MRI of the head May 02, 2016 FINDINGS: BRAIN: No intraparenchymal hemorrhage, mass effect nor midline shift. The  ventricles and sulci are normal for age. Patchy supratentorial white matter hypodensities within normal range for patient's age, though non-specific are most compatible with chronic small vessel ischemic disease. Chronic appearing bilateral thalamus and basal ganglia lacunar infarcts. New chronic appearing LEFT cerebellar small infarct. Similar mesial symmetric cerebellar atrophy. No acute large vascular territory infarcts. No abnormal extra-axial fluid collections. Basal cisterns are patent. VASCULAR: Trace calcific atherosclerosis of the carotid siphons. SKULL: No skull fracture. No significant scalp soft tissue swelling. SINUSES/ORBITS: The mastoid air-cells and included paranasal sinuses are well-aerated.The included ocular globes and orbital contents are non-suspicious. Status  post bilateral ocular lens implants. OTHER: None. IMPRESSION: 1. No acute intracranial process. 2. Old bilateral basal ganglia and thalamus lacunar infarcts. New nonacute LEFT cerebellar small infarct. 3. Chronic symmetric mesial cerebellar atrophy. 4. Mild to moderate chronic small vessel ischemic disease. Electronically Signed   By: Elon Alas M.D.   On: 06/28/2017 22:26    EKG: Sinus at 67bpm with leftward axis.  TWI in I, aVL, V4-6.  Assessment/Plan  This is a 82 y.o. female with a history of diabetes, hypertension, CKD, CAD s/p CABG, atrial fibrillation on Coumadin now being admitted with:  #. Generalized weakness secondary to hypercalcemia, unclear etiology -Admit observation, telemetry -IV fluid hydration for now.  -Repeat labs in 6 hours.  -Check albumin, vitamin D, PTH, ionized calcium, phosphorus -Pharmacy to check meds - Check EKG, UA - Check primary care screenings for malignancy - PT consult for disposition planning  #. Elevated troponin, may be related to CKD as patient has no chest pain or EKG changes.  - Monitor on tele - Trend trops - Check TSH - Consider cardio consult  #. Acute kidney  injury on CKD - IV fluids and repeat BMP in AM.  - Avoid nephrotoxic medications - Bladder scan and place foley catheter if evidence of urinary retention  #. Elevated LFTS, mild - Check abdominal US  #. History of hypertension -Continue hydralazine  #. History of atrial fibrillation -Continue amiodarone, Coreg - Coumadin per pharmacy  #. History of hyperlipidemia - Continue Lipitor  #. History of coronary artery disease status post CABG - Continue Coreg, Lipitor, Imdur  #. H/o Diabetes - Accuchecks achs with RISS coverage - Heart healthy, carb controlled diet  Admission status: Observation, tele  IV Fluids: NS Diet/Nutrition: HH, CC Consults called: None  DVT Px: Warfarin, SCDs and early ambulation. Code Status: Full Code  Disposition Plan: To home in 1-2 days  All the records are reviewed and case discussed with ED provider. Management plans discussed with the patient and/or family who express understanding and agree with plan of care.  Dalisa Forrer D.O. on 06/28/2017 at 11:35 PM Between 7am to 6pm - Pager - [phone] After 6pm go to [email or web address] - [employer] [facility] Office [phone] CC: Primary care physician; Kirk Ruths, MD   06/28/2017, 11:35 PM

## 2017-06-29 ENCOUNTER — Inpatient Hospital Stay: Payer: Medicare Other

## 2017-06-29 DIAGNOSIS — I2510 Atherosclerotic heart disease of native coronary artery without angina pectoris: Secondary | ICD-10-CM | POA: Diagnosis present

## 2017-06-29 DIAGNOSIS — N184 Chronic kidney disease, stage 4 (severe): Secondary | ICD-10-CM | POA: Diagnosis present

## 2017-06-29 DIAGNOSIS — E860 Dehydration: Secondary | ICD-10-CM | POA: Diagnosis present

## 2017-06-29 DIAGNOSIS — I4891 Unspecified atrial fibrillation: Secondary | ICD-10-CM | POA: Diagnosis present

## 2017-06-29 DIAGNOSIS — Z952 Presence of prosthetic heart valve: Secondary | ICD-10-CM | POA: Diagnosis not present

## 2017-06-29 DIAGNOSIS — Z7901 Long term (current) use of anticoagulants: Secondary | ICD-10-CM | POA: Diagnosis not present

## 2017-06-29 DIAGNOSIS — H409 Unspecified glaucoma: Secondary | ICD-10-CM | POA: Diagnosis present

## 2017-06-29 DIAGNOSIS — I129 Hypertensive chronic kidney disease with stage 1 through stage 4 chronic kidney disease, or unspecified chronic kidney disease: Secondary | ICD-10-CM | POA: Diagnosis present

## 2017-06-29 DIAGNOSIS — E1122 Type 2 diabetes mellitus with diabetic chronic kidney disease: Secondary | ICD-10-CM | POA: Diagnosis present

## 2017-06-29 DIAGNOSIS — E785 Hyperlipidemia, unspecified: Secondary | ICD-10-CM | POA: Diagnosis present

## 2017-06-29 DIAGNOSIS — Z951 Presence of aortocoronary bypass graft: Secondary | ICD-10-CM | POA: Diagnosis not present

## 2017-06-29 DIAGNOSIS — N179 Acute kidney failure, unspecified: Secondary | ICD-10-CM | POA: Diagnosis present

## 2017-06-29 DIAGNOSIS — Z833 Family history of diabetes mellitus: Secondary | ICD-10-CM | POA: Diagnosis not present

## 2017-06-29 LAB — URINALYSIS, COMPLETE (UACMP) WITH MICROSCOPIC
Bilirubin Urine: NEGATIVE
GLUCOSE, UA: NEGATIVE mg/dL
KETONES UR: NEGATIVE mg/dL
NITRITE: NEGATIVE
Protein, ur: NEGATIVE mg/dL
SPECIFIC GRAVITY, URINE: 1.006 (ref 1.005–1.030)
pH: 6 (ref 5.0–8.0)

## 2017-06-29 LAB — GLUCOSE, CAPILLARY
GLUCOSE-CAPILLARY: 145 mg/dL — AB (ref 65–99)
GLUCOSE-CAPILLARY: 174 mg/dL — AB (ref 65–99)
Glucose-Capillary: 154 mg/dL — ABNORMAL HIGH (ref 65–99)
Glucose-Capillary: 117 mg/dL — ABNORMAL HIGH (ref 65–99)
Glucose-Capillary: 129 mg/dL — ABNORMAL HIGH (ref 65–99)

## 2017-06-29 LAB — URINALYSIS, ROUTINE W REFLEX MICROSCOPIC
Bilirubin Urine: NEGATIVE
Glucose, UA: NEGATIVE mg/dL
KETONES UR: NEGATIVE mg/dL
Nitrite: NEGATIVE
PH: 6 (ref 5.0–8.0)
Protein, ur: NEGATIVE mg/dL
SPECIFIC GRAVITY, URINE: 1.005 (ref 1.005–1.030)

## 2017-06-29 LAB — CBC
HCT: 34.9 % — ABNORMAL LOW (ref 35.0–47.0)
Hemoglobin: 11.5 g/dL — ABNORMAL LOW (ref 12.0–16.0)
MCH: 26.1 pg (ref 26.0–34.0)
MCHC: 33.1 g/dL (ref 32.0–36.0)
MCV: 78.9 fL — ABNORMAL LOW (ref 80.0–100.0)
PLATELETS: 178 10*3/uL (ref 150–440)
RBC: 4.42 MIL/uL (ref 3.80–5.20)
RDW: 22.8 % — AB (ref 11.5–14.5)
WBC: 5.5 10*3/uL (ref 3.6–11.0)

## 2017-06-29 LAB — BASIC METABOLIC PANEL
Anion gap: 6 (ref 5–15)
BUN: 37 mg/dL — AB (ref 6–20)
CALCIUM: 12.6 mg/dL — AB (ref 8.9–10.3)
CO2: 26 mmol/L (ref 22–32)
CREATININE: 2.41 mg/dL — AB (ref 0.44–1.00)
Chloride: 104 mmol/L (ref 101–111)
GFR calc Af Amer: 21 mL/min — ABNORMAL LOW (ref >60)
GFR, EST NON AFRICAN AMERICAN: 18 mL/min — AB (ref >60)
GLUCOSE: 156 mg/dL — AB (ref 65–99)
Potassium: 4.1 mmol/L (ref 3.5–5.1)
SODIUM: 136 mmol/L (ref 135–145)

## 2017-06-29 LAB — HEPATIC FUNCTION PANEL
ALT: 70 U/L — AB (ref 14–54)
AST: 102 U/L — ABNORMAL HIGH (ref 15–41)
Albumin: 2.9 g/dL — ABNORMAL LOW (ref 3.5–5.0)
Alkaline Phosphatase: 106 U/L (ref 38–126)
BILIRUBIN DIRECT: 0.3 mg/dL (ref 0.1–0.5)
BILIRUBIN INDIRECT: 1.2 mg/dL — AB (ref 0.3–0.9)
TOTAL PROTEIN: 5.7 g/dL — AB (ref 6.5–8.1)
Total Bilirubin: 1.5 mg/dL — ABNORMAL HIGH (ref 0.3–1.2)

## 2017-06-29 LAB — PROTIME-INR
INR: 2.99
PROTHROMBIN TIME: 31.7 s — AB (ref 11.4–15.2)

## 2017-06-29 LAB — MAGNESIUM: MAGNESIUM: 2 mg/dL (ref 1.7–2.4)

## 2017-06-29 LAB — PHOSPHORUS: PHOSPHORUS: 4.1 mg/dL (ref 2.5–4.6)

## 2017-06-29 LAB — TSH: TSH: 0.871 u[IU]/mL (ref 0.350–4.500)

## 2017-06-29 MED ORDER — SPIRONOLACTONE 25 MG PO TABS
25.0000 mg | ORAL_TABLET | Freq: Every day | ORAL | Status: DC
  Administered 2017-06-29 – 2017-06-30 (×2): 25 mg via ORAL
  Filled 2017-06-29 (×2): qty 1

## 2017-06-29 MED ORDER — FUROSEMIDE 40 MG PO TABS
40.0000 mg | ORAL_TABLET | Freq: Every day | ORAL | Status: DC
  Administered 2017-06-29 – 2017-06-30 (×2): 40 mg via ORAL
  Filled 2017-06-29 (×2): qty 1

## 2017-06-29 MED ORDER — ONDANSETRON HCL 4 MG PO TABS: 4.0000 mg | ORAL_TABLET | Freq: Four times a day (QID) | ORAL | Status: DC | PRN

## 2017-06-29 MED ORDER — SODIUM CHLORIDE 0.9 % IV SOLN
INTRAVENOUS | Status: DC
  Administered 2017-06-29 – 2017-06-30 (×4): via INTRAVENOUS

## 2017-06-29 MED ORDER — ALBUTEROL SULFATE (2.5 MG/3ML) 0.083% IN NEBU: 2.5000 mg | INHALATION_SOLUTION | Freq: Four times a day (QID) | RESPIRATORY_TRACT | Status: DC | PRN

## 2017-06-29 MED ORDER — SENNOSIDES-DOCUSATE SODIUM 8.6-50 MG PO TABS: 1.0000 | ORAL_TABLET | Freq: Every evening | ORAL | Status: DC | PRN

## 2017-06-29 MED ORDER — ISOSORBIDE MONONITRATE ER 60 MG PO TB24
30.0000 mg | ORAL_TABLET | Freq: Every day | ORAL | Status: DC
  Administered 2017-06-29 – 2017-06-30 (×2): 30 mg via ORAL
  Filled 2017-06-29 (×2): qty 1

## 2017-06-29 MED ORDER — ACETAMINOPHEN 650 MG RE SUPP
650.0000 mg | Freq: Four times a day (QID) | RECTAL | Status: DC | PRN
Start: 2017-06-29 — End: 2017-06-30

## 2017-06-29 MED ORDER — ACETAMINOPHEN 325 MG PO TABS
650.0000 mg | ORAL_TABLET | Freq: Four times a day (QID) | ORAL | Status: DC | PRN
  Administered 2017-06-30: 650 mg via ORAL
  Filled 2017-06-29: qty 2

## 2017-06-29 MED ORDER — AMIODARONE HCL 200 MG PO TABS
200.0000 mg | ORAL_TABLET | Freq: Every day | ORAL | Status: DC
  Administered 2017-06-29 – 2017-06-30 (×2): 200 mg via ORAL
  Filled 2017-06-29 (×2): qty 1

## 2017-06-29 MED ORDER — LATANOPROST 0.005 % OP SOLN
1.0000 [drp] | Freq: Every day | OPHTHALMIC | Status: DC
  Administered 2017-06-29: 1 [drp] via OPHTHALMIC
  Filled 2017-06-29: qty 2.5

## 2017-06-29 MED ORDER — OXYCODONE HCL 5 MG PO TABS: 5.0000 mg | ORAL_TABLET | ORAL | Status: DC | PRN

## 2017-06-29 MED ORDER — IPRATROPIUM BROMIDE 0.02 % IN SOLN: 0.5000 mg | Freq: Four times a day (QID) | RESPIRATORY_TRACT | Status: DC | PRN

## 2017-06-29 MED ORDER — CALCITONIN (SALMON) 200 UNIT/ML IJ SOLN
4.0000 [IU]/kg | Freq: Two times a day (BID) | INTRAMUSCULAR | Status: DC
  Administered 2017-06-29 – 2017-06-30 (×4): 254 [IU] via INTRAMUSCULAR
  Filled 2017-06-29 (×6): qty 1.27

## 2017-06-29 MED ORDER — MAGNESIUM CITRATE PO SOLN
1.0000 | Freq: Once | ORAL | Status: DC | PRN
  Filled 2017-06-29: qty 296

## 2017-06-29 MED ORDER — INSULIN ASPART 100 UNIT/ML ~~LOC~~ SOLN
0.0000 [IU] | Freq: Three times a day (TID) | SUBCUTANEOUS | Status: DC
  Administered 2017-06-29 (×2): 2 [IU] via SUBCUTANEOUS
  Administered 2017-06-30 (×2): 1 [IU] via SUBCUTANEOUS
  Filled 2017-06-29 (×4): qty 1

## 2017-06-29 MED ORDER — CARVEDILOL 6.25 MG PO TABS
3.1250 mg | ORAL_TABLET | Freq: Two times a day (BID) | ORAL | Status: DC
  Administered 2017-06-29 – 2017-06-30 (×3): 3.125 mg via ORAL
  Filled 2017-06-29 (×3): qty 1

## 2017-06-29 MED ORDER — ATORVASTATIN CALCIUM 20 MG PO TABS
80.0000 mg | ORAL_TABLET | Freq: Every day | ORAL | Status: DC
  Administered 2017-06-29: 80 mg via ORAL
  Filled 2017-06-29: qty 4

## 2017-06-29 MED ORDER — WARFARIN - PHARMACIST DOSING INPATIENT
Freq: Every day | Status: DC
  Filled 2017-06-29 (×3): qty 1

## 2017-06-29 MED ORDER — ONDANSETRON HCL 4 MG/2ML IJ SOLN: 4.0000 mg | Freq: Four times a day (QID) | INTRAMUSCULAR | Status: DC | PRN

## 2017-06-29 MED ORDER — HYDRALAZINE HCL 50 MG PO TABS
50.0000 mg | ORAL_TABLET | Freq: Three times a day (TID) | ORAL | Status: DC
  Administered 2017-06-29 – 2017-06-30 (×4): 50 mg via ORAL
  Filled 2017-06-29 (×5): qty 1

## 2017-06-29 MED ORDER — INSULIN ASPART 100 UNIT/ML ~~LOC~~ SOLN: 0.0000 [IU] | Freq: Every day | SUBCUTANEOUS | Status: DC

## 2017-06-29 MED ORDER — BISACODYL 5 MG PO TBEC: 5.0000 mg | DELAYED_RELEASE_TABLET | Freq: Every day | ORAL | Status: DC | PRN

## 2017-06-29 MED ORDER — WARFARIN SODIUM 2 MG PO TABS
2.0000 mg | ORAL_TABLET | ORAL | Status: DC
  Administered 2017-06-29: 2 mg via ORAL
  Filled 2017-06-29: qty 1

## 2017-06-29 MED ORDER — TIMOLOL HEMIHYDRATE 0.5 % OP SOLN
1.0000 [drp] | Freq: Two times a day (BID) | OPHTHALMIC | Status: DC
  Administered 2017-06-29 (×2): 1 [drp] via OPHTHALMIC
  Filled 2017-06-29: qty 5

## 2017-06-29 MED ORDER — WARFARIN SODIUM 1 MG PO TABS: 1.0000 mg | ORAL_TABLET | ORAL | Status: DC

## 2017-06-29 NOTE — Progress Notes (Signed)
ANTICOAGULATION CONSULT NOTE - Initial Consult  Pharmacy Consult for warfarin Indication: atrial fibrillation  No Known Allergies  Patient Measurements: Height: 5\' 7"  (170.2 cm) Weight: 139 lb 11.2 oz (63.4 kg) IBW/kg (Calculated) : 61.6 Heparin Dosing Weight: 63.4 kg  Vital Signs: Temp: 97.9 F (36.6 C) (07/09 0300) Temp Source: Oral (07/09 0300) BP: 180/74 (07/09 0300) Pulse Rate: 70 (07/09 0300)  Labs:  Recent Labs  06/28/17 2134  HGB 12.0  HCT 36.7  PLT 185  LABPROT 29.9*  INR 2.78  CREATININE 2.73*  TROPONINI 0.06*    Estimated Creatinine Clearance: 16.2 mL/min (A) (by C-G formula based on SCr of 2.73 mg/dL (H)).   Medical History: Past Medical History:  Diagnosis Date  . [name]    a. LHC 02/03/17 for unstable angina showed severe 3v CAD w/ sequential 70% & 80% p-mLAD, 99% mLCx, CTO mRCA, elevated LV filling pressure  . Colon polyp 2010  . Diabetes mellitus with complication ([place])   . Glaucoma   . Hypertension     Medications:  Scheduled:  . amiodarone  200 mg Oral Daily  . atorvastatin  80 mg Oral q1800  . calcitonin  4 Units/kg Intramuscular BID  . carvedilol  3.125 mg Oral BID  . furosemide  40 mg Oral Daily  . hydrALAZINE  50 mg Oral TID  . insulin aspart  0-5 Units Subcutaneous QHS  . insulin aspart  0-9 Units Subcutaneous TID WC  . isosorbide mononitrate  30 mg Oral Daily  . latanoprost  1 drop Both Eyes QHS  . spironolactone  25 mg Oral Daily  . timolol  1 drop Both Eyes BID  . [START ON 06/30/2017] warfarin  1 mg Oral Q T,Th,S,Su-1800   And  . warfarin  2 mg Oral Q M,W,F-1800    Assessment: Patient admitted w/ RS leg and arm weakness. Pt has h/o afib and is chronically anticoagulated w/ warfarin. Warfarin home dose: Warfarin 1 mg TThSaSun Warfarin 2 mg MWF  7/8 INR 2.78 Goal of Therapy:  INR 2-3 Monitor platelets by anticoagulation protocol: Yes   Plan:  Will continue patient's home warfarin regimen since INR is  currently in therapeutic range. Will monitor daily INRs and CBC.  Tobie Lords, PharmD, BCPS Clinical Pharmacist 06/29/2017

## 2017-06-29 NOTE — Progress Notes (Signed)
Patient OOB sitting in the chair, ambulate with PT , family at bedside, vss denies any pain , will continue to monitor

## 2017-06-29 NOTE — Evaluation (Signed)
Physical Therapy Evaluation Patient Details Name: [name] [name] MRN: [id number] DOB: 06/06/1935 Today's Date: 06/29/2017   History of Present Illness  Pt is a 82 y/o F who presented with generalized weakness associated with BLE edema.  Pt found to be hypercalcemic. Pt's PMH includes CKD, CAD s/p CABG, a-fib.    Clinical Impression   Pt admitted with above diagnosis. Pt currently with functional limitations due to the deficits listed below (see PT Problem List). Ms. Seybold presents with generalized weakness BUE and BLE but was able to ambulate 200 ft with steady gait using SPC today.   Pt admits that she has gotten weaker following her HHPT which ended in June 2018.  Recommending OPPT at d/c to address weakness.  Pt will benefit from skilled PT to increase their independence and safety with mobility to allow discharge to the venue listed below.     Follow Up Recommendations Outpatient PT    Equipment Recommendations  None recommended by PT    Recommendations for Other Services       Precautions / Restrictions Precautions Precautions: Fall Restrictions Weight Bearing Restrictions: No      Mobility  Bed Mobility Overal bed mobility: Independent             General bed mobility comments: No physical assist or cues needed, pt performs independently  Transfers Overall transfer level: Needs assistance Equipment used: Straight cane Transfers: Sit to/from Stand Sit to Stand: Supervision         General transfer comment: Supervision for safety.  Pt performs without assist or cues.    Ambulation/Gait Ambulation/Gait assistance: Supervision Ambulation Distance (Feet): 200 Feet Assistive device: Straight cane Gait Pattern/deviations: Step-through pattern;Decreased stride length Gait velocity: mildly decreased Gait velocity interpretation: Below normal speed for age/gender General Gait Details: Mildly decreased gait speed but pt ambulates with steady gait using SPC.  No  signs of instability with higher level balance activities as documented above.    Stairs            Wheelchair Mobility    Modified Rankin (Stroke Patients Only)       Balance Overall balance assessment: Needs assistance Sitting-balance support: No upper extremity supported;Feet supported Sitting balance-Leahy Scale: Good     Standing balance support: No upper extremity supported;During functional activity Standing balance-Leahy Scale: Fair Standing balance comment: Pt able to stand statically without UE support but requires UE support for dynamic activities             High level balance activites: Backward walking;Head turns;Sudden stops;Turns High Level Balance Comments: No signs of instability with higher level balance activities Standardized Balance Assessment Standardized Balance Assessment : Dynamic Gait Index   Dynamic Gait Index Level Surface: Mild Impairment Change in Gait Speed: Normal Gait with Horizontal Head Turns: Normal Gait with Vertical Head Turns: Normal Gait and Pivot Turn: Mild Impairment Step Over Obstacle: Normal Step Around Obstacles: Normal       Pertinent Vitals/Pain Pain Assessment: No/denies pain    Home Living Family/patient expects to be discharged to:: Private residence Living Arrangements: Alone Available Help at Discharge: Family;Available 24 hours/day (Daughter can stay with pt at d/c as long as she needs) Type of Home: Mobile home Home Access: Ramped entrance     Home Layout: One level Home Equipment: Cane - single point;Grab bars - tub/shower;Hand held shower head;Shower seat      Prior Function Level of Independence: Independent         Comments: Pt drives and is  independent with bathing, dressing, cooking.  Has someone help her with cleaning.  Pt denies any falls in the past 6 months.  Pt was receiving HHPT until June 2018 with focus on generalized strengthening.  Pt admits that she has not continued  consistently performing her HEP and has noticed herself getting more weak since.     Hand Dominance        Extremity/Trunk Assessment   Upper Extremity Assessment Upper Extremity Assessment:  (BUE strength grossly 4/5)    Lower Extremity Assessment Lower Extremity Assessment:  (BLE strength grossly 4/5)       Communication   Communication: No difficulties  Cognition Arousal/Alertness: Awake/alert Behavior During Therapy: WFL for tasks assessed/performed Overall Cognitive Status: Within Functional Limits for tasks assessed                                        General Comments      Exercises Other Exercises Other Exercises: Encouraged pt to ambulate at least 3x/day with nursing staff   Assessment/Plan    PT Assessment Patient needs continued PT services  PT Problem List Decreased strength;Decreased balance;Decreased safety awareness       PT Treatment Interventions DME instruction;Gait training;Functional mobility training;Therapeutic activities;Balance training;Therapeutic exercise;Patient/family education    PT Goals (Current goals can be found in the Care Plan section)  Acute Rehab PT Goals Patient Stated Goal: to get stronger PT Goal Formulation: With patient Time For Goal Achievement: 07/13/17 Potential to Achieve Goals: Good    Frequency Min 2X/week   Barriers to discharge        Co-evaluation               AM-PAC PT "6 Clicks" Daily Activity  Outcome Measure Difficulty turning over in bed (including adjusting bedclothes, sheets and blankets)?: None Difficulty moving from lying on back to sitting on the side of the bed? : None Difficulty sitting down on and standing up from a chair with arms (e.g., wheelchair, bedside commode, etc,.)?: A Little Help needed moving to and from a bed to chair (including a wheelchair)?: A Little Help needed walking in hospital room?: A Little Help needed climbing 3-5 steps with a railing? : A  Little 6 Click Score: 20    End of Session Equipment Utilized During Treatment: Gait belt Activity Tolerance: Patient tolerated treatment well Patient left: in chair;with call bell/phone within reach;with chair alarm set;with family/visitor present Nurse Communication: Mobility status PT Visit Diagnosis: Muscle weakness (generalized) (M62.81);Unsteadiness on feet (R26.81)    Time: 1245-8099 PT Time Calculation (min) (ACUTE ONLY): 26 min   Charges:   PT Evaluation $PT Eval Low Complexity: 1 Procedure PT Treatments $Gait Training: 8-22 mins   PT G Codes:        Collie Siad PT, DPT 06/29/2017, 11:10 AM

## 2017-06-29 NOTE — Care Management Obs Status (Signed)
[facility] NOTIFICATION   Patient Details  Name: [name] [name] MRN: [id number] Date of Birth: 1935/06/05   Medicare Observation Status Notification Given:  Yes    Katrina Stack, RN 06/29/2017, 9:05 AM

## 2017-06-29 NOTE — ED Provider Notes (Signed)
-----------------------------------------   12:17 AM on 06/29/2017 -----------------------------------------  Repeat EKG ordered by hospitalist  ED ECG REPORT I, Hadas Jessop, the attending physician, personally viewed and interpreted this ECG.  Date: 06/28/2017 EKG Time: 23:55 Rate: 67 Rhythm: normal sinus rhythm QRS Axis: LAD Intervals: normal ST/T Wave abnormalities: Non-specific ST segment / T-wave changes, but no evidence of acute ischemia. Narrative Interpretation: no evidence of acute ischemia     Hinda Kehr, MD 06/29/17 [phone]

## 2017-06-29 NOTE — Progress Notes (Signed)
[facility] [place] NAME: [name] [name]    MR#:  [id number]  DATE OF BIRTH:  08/19/1935  SUBJECTIVE:   Came in with bilateral LE weakness and found to have elevated calcium Poor appetite REVIEW OF SYSTEMS:   Review of Systems  Constitutional: Negative for chills, fever and weight loss.  HENT: Negative for ear discharge, ear pain and nosebleeds.   Eyes: Negative for blurred vision, pain and discharge.  Respiratory: Negative for sputum production, shortness of breath, wheezing and stridor.   Cardiovascular: Negative for chest pain, palpitations, orthopnea and PND.  Gastrointestinal: Negative for abdominal pain, diarrhea, nausea and vomiting.  Genitourinary: Negative for frequency and urgency.  Musculoskeletal: Negative for back pain and joint pain.  Neurological: Positive for weakness. Negative for sensory change, speech change and focal weakness.  Psychiatric/Behavioral: Negative for depression and hallucinations. The patient is not nervous/anxious.    Tolerating Diet:yes Tolerating PT: out pt PT  DRUG ALLERGIES:  No Known Allergies  VITALS:  Blood pressure 131/71, pulse 63, temperature 97.7 F (36.5 C), temperature source Oral, resp. rate 15, height 5\' 7"  (1.702 m), weight 63.4 kg (139 lb 11.2 oz), SpO2 99 %.  PHYSICAL EXAMINATION:   Physical Exam  GENERAL:  82 y.o.-year-old patient lying in the bed with no acute distress.  EYES: Pupils equal, round, reactive to light and accommodation. No scleral icterus. Extraocular muscles intact.  HEENT: Head atraumatic, normocephalic. Oropharynx and nasopharynx clear.  NECK:  Supple, no jugular venous distention. No thyroid enlargement, no tenderness.  LUNGS: Normal breath sounds bilaterally, no wheezing, rales, rhonchi. No use of accessory muscles of respiration.  CARDIOVASCULAR: S1, S2 normal. No murmurs, rubs, or gallops.  ABDOMEN: Soft, nontender, nondistended. Bowel sounds  present. No organomegaly or mass.  EXTREMITIES: No cyanosis, clubbing or edema b/l.    NEUROLOGIC: Cranial nerves II through XII are intact. No focal Motor or sensory deficits b/l.   PSYCHIATRIC:  patient is alert and oriented x 3.  SKIN: No obvious rash, lesion, or ulcer.   LABORATORY PANEL:  CBC  Recent Labs Lab 06/29/17 0520  WBC 5.5  HGB 11.5*  HCT 34.9*  PLT 178    Chemistries   Recent Labs Lab 06/28/17 2134 06/29/17 0520  NA 137 136  K 4.2 4.1  CL 105 104  CO2 26 26  GLUCOSE 160* 156*  BUN 39* 37*  CREATININE 2.73* 2.41*  CALCIUM 13.8* 12.6*  MG  --  2.0  AST 102*  --   ALT 70*  --   ALKPHOS 106  --   BILITOT 1.5*  --    Cardiac Enzymes  Recent Labs Lab 06/28/17 2134  TROPONINI 0.06*   RADIOLOGY:  Ct Head Wo Contrast  Result Date: 06/28/2017 CLINICAL DATA:  RIGHT extremity weakness for 3 days, ataxia. History of hypertension and diabetes. EXAM: CT HEAD WITHOUT CONTRAST TECHNIQUE: Contiguous axial images were obtained from the base of the skull through the vertex without intravenous contrast. COMPARISON:  MRI of the head May 02, 2016 FINDINGS: BRAIN: No intraparenchymal hemorrhage, mass effect nor midline shift. The ventricles and sulci are normal for age. Patchy supratentorial white matter hypodensities within normal range for patient's age, though non-specific are most compatible with chronic small vessel ischemic disease. Chronic appearing bilateral thalamus and basal ganglia lacunar infarcts. New chronic appearing LEFT cerebellar small infarct. Similar mesial symmetric cerebellar atrophy. No acute large vascular territory infarcts. No abnormal extra-axial fluid collections. Basal cisterns are patent. VASCULAR:  Trace calcific atherosclerosis of the carotid siphons. SKULL: No skull fracture. No significant scalp soft tissue swelling. SINUSES/ORBITS: The mastoid air-cells and included paranasal sinuses are well-aerated.The included ocular globes and orbital  contents are non-suspicious. Status post bilateral ocular lens implants. OTHER: None. IMPRESSION: 1. No acute intracranial process. 2. Old bilateral basal ganglia and thalamus lacunar infarcts. New nonacute LEFT cerebellar small infarct. 3. Chronic symmetric mesial cerebellar atrophy. 4. Mild to moderate chronic small vessel ischemic disease. Electronically Signed   By: Elon Alas M.D.   On: 06/28/2017 22:26   ASSESSMENT AND PLAN:  82 y.o. female with a history of diabetes, hypertension, CKD, CAD s/p CABG, atrial fibrillation on Coumadin now being admitted with:  #. Generalized weakness secondary to hypercalcemia, unclear etiology -IV fluid hydration for now.  -calcium 13.6---IVF--12.6 -PTH wnl (pt reports undergoing parathyroid sx---does not know any details and no mention of it in old records) -vitamin D pending -phosphorus wnl -Pharmacy to check meds - - Check primary care screenings for malignancy - CXR today -denies any overuse of calcium supplements or TUMS  #. Elevated troponin, may be related to CKD as patient has no chest pain or EKG changes.  -asymptomatic  #. Acute kidney injury on CKD - IV fluids and repeat BMP in AM.  - Avoid nephrotoxic medications  #. Elevated LFTS, mild - Check abdominal US  #. History of hypertension -Continue hydralazine  #. History of atrial fibrillation -Continue amiodarone, Coreg - Coumadin per pharmacy  #. History of hyperlipidemia - Continue Lipitor  #. History of coronary artery disease status post CABG - Continue Coreg, Lipitor, Imdur  #. H/o Diabetes - Accuchecks achs with RISS coverage - Heart healthy, carb controlled diet  Case discussed with Care Management/Social Worker. Management plans discussed with the patient, family and they are in agreement.  CODE STATUS: full  DVT Prophylaxis: lovenox  TOTAL TIME TAKING CARE OF THIS PATIENT: *30* minutes.  >50% time spent on counselling and coordination of  care  POSSIBLE D/C IN 1-2 DAYS, DEPENDING ON CLINICAL CONDITION.  Note: This dictation was prepared with Dragon dictation along with smaller phrase technology. Any transcriptional errors that result from this process are unintentional.  Westlynn Fifer M.D on 06/29/2017 at 2:29 PM  Between 7am to 6pm - Pager - [phone]  After 6pm go to [email or web address] - password [email or web address] [facility]  Office  [phone]  CC: Primary care physician; Kirk Ruths, MD

## 2017-06-30 ENCOUNTER — Inpatient Hospital Stay: Payer: Medicare Other

## 2017-06-30 LAB — CBC
HEMATOCRIT: 32.3 % — AB (ref 35.0–47.0)
HEMOGLOBIN: 10.7 g/dL — AB (ref 12.0–16.0)
MCH: 25.9 pg — ABNORMAL LOW (ref 26.0–34.0)
MCHC: 33 g/dL (ref 32.0–36.0)
MCV: 78.6 fL — ABNORMAL LOW (ref 80.0–100.0)
Platelets: 166 10*3/uL (ref 150–440)
RBC: 4.12 MIL/uL (ref 3.80–5.20)
RDW: 23 % — ABNORMAL HIGH (ref 11.5–14.5)
WBC: 6 10*3/uL (ref 3.6–11.0)

## 2017-06-30 LAB — GLUCOSE, CAPILLARY
GLUCOSE-CAPILLARY: 123 mg/dL — AB (ref 65–99)
GLUCOSE-CAPILLARY: 142 mg/dL — AB (ref 65–99)

## 2017-06-30 LAB — BASIC METABOLIC PANEL
Anion gap: 4 — ABNORMAL LOW (ref 5–15)
BUN: 34 mg/dL — ABNORMAL HIGH (ref 6–20)
CHLORIDE: 111 mmol/L (ref 101–111)
CO2: 25 mmol/L (ref 22–32)
Calcium: 10.9 mg/dL — ABNORMAL HIGH (ref 8.9–10.3)
Creatinine, Ser: 2.08 mg/dL — ABNORMAL HIGH (ref 0.44–1.00)
GFR calc Af Amer: 25 mL/min — ABNORMAL LOW (ref >60)
GFR calc non Af Amer: 22 mL/min — ABNORMAL LOW (ref >60)
Glucose, Bld: 118 mg/dL — ABNORMAL HIGH (ref 65–99)
POTASSIUM: 4 mmol/L (ref 3.5–5.1)
Sodium: 140 mmol/L (ref 135–145)

## 2017-06-30 LAB — VITAMIN D 25 HYDROXY (VIT D DEFICIENCY, FRACTURES): Vit D, 25-Hydroxy: 44.6 ng/mL (ref 30.0–100.0)

## 2017-06-30 LAB — PTH, INTACT AND CALCIUM
CALCIUM TOTAL (PTH): 12.9 mg/dL — AB (ref 8.7–10.3)
PTH: 23 pg/mL (ref 15–65)

## 2017-06-30 LAB — CALCIUM, IONIZED: Calcium, Ionized, Serum: 8 mg/dL — ABNORMAL HIGH (ref 4.5–5.6)

## 2017-06-30 LAB — PROTIME-INR
INR: 3.28
PROTHROMBIN TIME: 34.1 s — AB (ref 11.4–15.2)

## 2017-06-30 MED ORDER — WARFARIN - PHARMACIST DOSING INPATIENT: Freq: Every day | Status: DC

## 2017-06-30 MED ORDER — TIMOLOL MALEATE 0.5 % OP SOLN
1.0000 [drp] | Freq: Two times a day (BID) | OPHTHALMIC | Status: DC
  Administered 2017-06-30: 1 [drp] via OPHTHALMIC
  Filled 2017-06-30: qty 5

## 2017-06-30 MED ORDER — HYDRALAZINE HCL 50 MG PO TABS: 25.0000 mg | ORAL_TABLET | Freq: Two times a day (BID) | ORAL | 0 refills | Status: DC

## 2017-06-30 NOTE — Progress Notes (Signed)
[name] [name] for warfarin Indication: atrial fibrillation  No Known Allergies  Patient Measurements: Height: 5\' 7"  (170.2 cm) Weight: 139 lb 11.2 oz (63.4 kg) IBW/kg (Calculated) : 61.6 Heparin Dosing Weight: 63.4 kg  Vital Signs: Temp: 97.6 F (36.4 C) (07/10 0752) Temp Source: Oral (07/10 0752) BP: 113/79 (07/10 0752) Pulse Rate: 65 (07/10 0752)  Labs:  Recent Labs  06/28/17 2134 06/29/17 0520 06/30/17 0506  HGB 12.0 11.5* 10.7*  HCT 36.7 34.9* 32.3*  PLT 185 178 166  LABPROT 29.9* 31.7* 34.1*  INR 2.78 2.99 3.28  CREATININE 2.73* 2.41* 2.08*  TROPONINI 0.06*  --   --     Estimated Creatinine Clearance: 21.3 mL/min (A) (by C-G formula based on SCr of 2.08 mg/dL (H)).  Assessment: 82 yo female admitted w/weakness. Pt has h/o afib and is chronically anticoagulated w/ warfarin. Pt also on amiodarone PTA.   Warfarin home dose: Warfarin 1 mg TThSaSun Warfarin 2 mg MWF  7/8 INR 2.78 7/9 INR 2.99 - warfarin 2mg   7/10 INR 3.28  Goal of Therapy:  INR 2-3 Monitor platelets by anticoagulation protocol: Yes   Plan:  INR above goal, will hold warfarin for today Will monitor daily INRs and CBC.  Pharmacy will continue to follow.   [name] [name], PharmD, BCPS Clinical Pharmacist 06/30/2017 11:18 AM

## 2017-06-30 NOTE — Discharge Summary (Signed)
[facility] [place] NAME: [name] [name]    MR#:  [id number]  DATE OF BIRTH:  [birth year]  DATE OF ADMISSION:  06/28/2017 ADMITTING PHYSICIAN: Harvie Bridge, DO  DATE OF DISCHARGE: 7/101/8  PRIMARY CARE PHYSICIAN: Kirk Ruths, MD    ADMISSION DIAGNOSIS:  Hypercalcemia [E83.52] Weakness [R53.1]  DISCHARGE DIAGNOSIS:  Acute on chronic renal failure due to dehydration Acute hypercalcemia--improving Relative low BP  SECONDARY DIAGNOSIS:   Past Medical History:  Diagnosis Date  . [name]    a. LHC 02/03/17 for unstable angina showed severe 3v CAD w/ sequential 70% & 80% p-mLAD, 99% mLCx, CTO mRCA, elevated LV filling pressure  . Colon polyp 2010  . Diabetes mellitus with complication ([place])   . Glaucoma   . Hypertension     HOSPITAL COURSE:  82 y.o.femalewith a history of diabetes, hypertension, CKD, CAD s/p CABG, atrial fibrillation on Coumadinnow being admitted with:  #. Generalized weakness secondary to hypercalcemia,appearspdue to poor po intake -IV fluid hydration for now.  -calcium 13.6---IVF--12.6--10.6 -PTH wnl (pt reports undergoing parathyroid sx---does not know any details and no mention of it in old records) -phosphorus wnl - CXR negative for any mass -denies any overuse of calcium supplements or TUMS  #. Elevated troponin, may be related to CKD as patient has no chest pain or EKG changes.  -asymptomatic  #. Acute kidney injury on CKD - IV fluids and repeat creat 2.0 (baseline creat 1.7) - Avoid nephrotoxic medications  #. History of hypertension -Continue hydralazine (decreased dose to 25 mg bid) -hold lasix for now due to dehydration  #. History of atrial fibrillation -Continue amiodarone, Coreg - Coumadin per pharmacy  #. History of hyperlipidemia - Continue Lipitor  #. History of coronary artery disease status post CABG - Continue Coreg, Lipitor, Imdur  #.  H/o Diabetes - Accuchecks achs with RISS coverage - Heart healthy, carb controlled diet  Overall appears ok D/c home  CONSULTS OBTAINED:    DRUG ALLERGIES:  No Known Allergies  DISCHARGE MEDICATIONS:   Current Discharge Medication List    CONTINUE these medications which have NOT CHANGED   Details  acetaminophen (TYLENOL) 500 MG tablet Take 500 mg by mouth every 6 (six) hours as needed for mild pain.     amiodarone (PACERONE) 200 MG tablet Take 1 tablet (200 mg total) by mouth daily. Qty: 14 tablet, Refills: 0    atorvastatin (LIPITOR) 80 MG tablet Take 1 tablet (80 mg total) by mouth daily at 6 PM. Qty: 14 tablet, Refills: 0    carvedilol (COREG) 3.125 MG tablet Take 1 tablet (3.125 mg total) by mouth 2 (two) times daily. Qty: 28 tablet, Refills: 0    isosorbide mononitrate (IMDUR) 30 MG 24 hr tablet Take 1 tablet (30 mg total) by mouth daily. Qty: 14 tablet, Refills: 0    latanoprost (XALATAN) 0.005 % ophthalmic solution Place 1 drop into both eyes at bedtime.     timolol (BETIMOL) 0.5 % ophthalmic solution Place 1 drop into both eyes 2 (two) times daily.    warfarin (COUMADIN) 1 MG tablet Take 2 tablets daily or As Directed Qty: 60 tablet, Refills: 1      STOP taking these medications     furosemide (LASIX) 40 MG tablet      hydrALAZINE (APRESOLINE) 50 MG tablet      spironolactone (ALDACTONE) 25 MG tablet         If you experience worsening  of your admission symptoms, develop shortness of breath, life threatening emergency, suicidal or homicidal thoughts you must seek medical attention immediately by calling 911 or calling your MD immediately  if symptoms less severe.  You Must read complete instructions/literature along with all the possible adverse reactions/side effects for all the Medicines you take and that have been prescribed to you. Take any new Medicines after you have completely understood and accept all the possible adverse reactions/side effects.    Please note  You were cared for by a hospitalist during your hospital stay. If you have any questions about your discharge medications or the care you received while you were in the hospital after you are discharged, you can call the unit and asked to speak with the hospitalist on call if the hospitalist that took care of you is not available. Once you are discharged, your primary care physician will handle any further medical issues. Please note that NO REFILLS for any discharge medications will be authorized once you are discharged, as it is imperative that you return to your primary care physician (or establish a relationship with a primary care physician if you do not have one) for your aftercare needs so that they can reassess your need for medications and monitor your lab values. Today   SUBJECTIVE  Some emesis earlier   VITAL SIGNS:  Blood pressure (!) 134/55, pulse (!) 58, temperature 98 F (36.7 C), temperature source Oral, resp. rate 18, height 5\' 7"  (1.702 m), weight 63.4 kg (139 lb 11.2 oz), SpO2 99 %.  I/O:   Intake/Output Summary (Last 24 hours) at 06/30/17 1425 Last data filed at 06/30/17 1014  Gross per 24 hour  Intake          1355.42 ml  Output              300 ml  Net          1055.42 ml    PHYSICAL EXAMINATION:  GENERAL:  82 y.o.-year-old patient lying in the bed with no acute distress.  EYES: Pupils equal, round, reactive to light and accommodation. No scleral icterus. Extraocular muscles intact.  HEENT: Head atraumatic, normocephalic. Oropharynx and nasopharynx clear.  NECK:  Supple, no jugular venous distention. No thyroid enlargement, no tenderness.  LUNGS: Normal breath sounds bilaterally, no wheezing, rales,rhonchi or crepitation. No use of accessory muscles of respiration.  CARDIOVASCULAR: S1, S2 normal. No murmurs, rubs, or gallops.  ABDOMEN: Soft, non-tender, non-distended. Bowel sounds present. No organomegaly or mass.  EXTREMITIES: No pedal edema,  cyanosis, or clubbing.  NEUROLOGIC: Cranial nerves II through XII are intact. Muscle strength 5/5 in all extremities. Sensation intact. Gait not checked.  PSYCHIATRIC: The patient is alert and oriented x 3.  SKIN: No obvious rash, lesion, or ulcer.   DATA REVIEW:   CBC   Recent Labs Lab 06/30/17 0506  WBC 6.0  HGB 10.7*  HCT 32.3*  PLT 166    Chemistries   Recent Labs Lab 06/28/17 2134 06/29/17 0520 06/30/17 0506  NA 137 136 140  K 4.2 4.1 4.0  CL 105 104 111  CO2 26 26 25   GLUCOSE 160* 156* 118*  BUN 39* 37* 34*  CREATININE 2.73* 2.41* 2.08*  CALCIUM 13.8* 12.6*  12.9* 10.9*  MG  --  2.0  --   AST 102*  --   --   ALT 70*  --   --   ALKPHOS 106  --   --   BILITOT 1.5*  --   --  Microbiology Results   No results found for this or any previous visit (from the past 240 hour(s)).  RADIOLOGY:  Dg Chest 2 View  Result Date: 06/30/2017 CLINICAL DATA:  82 year old female with bilateral lower extremity weakness. Elevated calcium. Hypertension. Initial encounter. EXAM: CHEST  2 VIEW COMPARISON:  05/27/2017. FINDINGS: Post CABG and mitral valve replacement.  Cardiomegaly. Pulmonary vascular prominence greatest centrally. No segmental consolidation or pneumothorax. Partially calcified tortuous aorta. Shoulder joint degenerative changes. Degenerative changes thoracic spine without focal compression fracture. IMPRESSION: Cardiomegaly.  Post mitral valve replacement and CABG. Pulmonary vascular prominence greater centrally without significant change from the prior exam. Aortic atherosclerosis. Electronically Signed   By: Genia Del M.D.   On: 06/30/2017 08:11   Ct Head Wo Contrast  Result Date: 06/28/2017 CLINICAL DATA:  RIGHT extremity weakness for 3 days, ataxia. History of hypertension and diabetes. EXAM: CT HEAD WITHOUT CONTRAST TECHNIQUE: Contiguous axial images were obtained from the base of the skull through the vertex without intravenous contrast. COMPARISON:  MRI of  the head May 02, 2016 FINDINGS: BRAIN: No intraparenchymal hemorrhage, mass effect nor midline shift. The ventricles and sulci are normal for age. Patchy supratentorial white matter hypodensities within normal range for patient's age, though non-specific are most compatible with chronic small vessel ischemic disease. Chronic appearing bilateral thalamus and basal ganglia lacunar infarcts. New chronic appearing LEFT cerebellar small infarct. Similar mesial symmetric cerebellar atrophy. No acute large vascular territory infarcts. No abnormal extra-axial fluid collections. Basal cisterns are patent. VASCULAR: Trace calcific atherosclerosis of the carotid siphons. SKULL: No skull fracture. No significant scalp soft tissue swelling. SINUSES/ORBITS: The mastoid air-cells and included paranasal sinuses are well-aerated.The included ocular globes and orbital contents are non-suspicious. Status post bilateral ocular lens implants. OTHER: None. IMPRESSION: 1. No acute intracranial process. 2. Old bilateral basal ganglia and thalamus lacunar infarcts. New nonacute LEFT cerebellar small infarct. 3. Chronic symmetric mesial cerebellar atrophy. 4. Mild to moderate chronic small vessel ischemic disease. Electronically Signed   By: Elon Alas M.D.   On: 06/28/2017 22:26     Management plans discussed with the patient, family and they are in agreement.  CODE STATUS:     Code Status Orders        Start     Ordered   06/29/17 0302  Full code  Continuous     06/29/17 0301    Code Status History    Date Active Date Inactive Code Status Order ID Comments User Context   02/03/2017  4:58 PM 03/05/2017 10:51 PM Full Code [id number]  Consuelo Pandy, PA-C Inpatient   02/03/2017  2:04 PM 02/03/2017  4:03 PM Full Code [id number]  Nelva Bush, MD Inpatient      TOTAL TIME TAKING CARE OF THIS PATIENT: 40 minutes.    Benjiman Sedgwick M.D on 06/30/2017 at 2:25 PM  Between 7am to 6pm - Pager - [phone] After  6pm go to [email or web address] - password [email or web address] [facility]  Office  [phone]  CC: Primary care physician; Kirk Ruths, MD

## 2017-06-30 NOTE — Care Management Important Message (Signed)
Important Message  Patient Details  Name: [name] [name] MRN: [id number] Date of Birth: Feb 03, 1935   Medicare Important Message Given:  N/A - LOS <3 / Initial given by admissions    Katrina Stack, RN 06/30/2017, 4:02 PM

## 2017-06-30 NOTE — Progress Notes (Signed)
Physical Therapy Treatment Patient Details Name: [name] [name] MRN: [id number] DOB: 11-13-1935 Today's Date: 06/30/2017    History of Present Illness Pt is a 82 y/o F who presented with generalized weakness associated with BLE edema.  Pt found to be hypercalcemic. Pt's PMH includes CKD, CAD s/p CABG, a-fib.   PT Comments    [name] is making good progress with mobility and tolerated progression of strengthening exercises well.  She is able to maintain her balance with higher level balance activities.  Pt will benefit from continued skilled PT services to increase functional independence and safety.    Follow Up Recommendations  Outpatient PT     Equipment Recommendations  None recommended by PT    Recommendations for Other Services       Precautions / Restrictions Precautions Precautions: Fall Restrictions Weight Bearing Restrictions: No    Mobility  Bed Mobility Overal bed mobility: Independent             General bed mobility comments: No physical assist or cues needed, pt performs independently  Transfers Overall transfer level: Needs assistance Equipment used: Straight cane Transfers: Sit to/from Stand Sit to Stand: Supervision         General transfer comment: Supervision for safety.  Pt performs without assist or cues.    Ambulation/Gait Ambulation/Gait assistance: Supervision Ambulation Distance (Feet): 200 Feet Assistive device: Straight cane Gait Pattern/deviations: Step-through pattern;Decreased stride length Gait velocity: mildly decreased Gait velocity interpretation: Below normal speed for age/gender General Gait Details: Mildly decreased gait speed but pt ambulates with steady gait using SPC.  No signs of instability with higher level balance activities as documented below.    Stairs            Wheelchair Mobility    Modified Rankin (Stroke Patients Only)       Balance Overall balance assessment: Needs  assistance Sitting-balance support: No upper extremity supported;Feet supported Sitting balance-Leahy Scale: Good     Standing balance support: No upper extremity supported;During functional activity Standing balance-Leahy Scale: Fair Standing balance comment: Pt able to stand statically without UE support but requires UE support for dynamic activities             High level balance activites: Sudden stops;Head turns;Turns;Backward walking;Side stepping High Level Balance Comments: No signs of instability with higher level balance activities            Cognition Arousal/Alertness: Awake/alert Behavior During Therapy: WFL for tasks assessed/performed Overall Cognitive Status: Within Functional Limits for tasks assessed                                        Exercises Other Exercises Other Exercises: Encouraged pt to ambulate at least 3x/day with nursing staff Other Exercises: Sit<>stand from bed without UE support x10 with supervision for safety Other Exercises: Mini squats with BUEs supported on countertop x10.  Pt reports fatigue at end of set. Other Exercises: Bil heel raises with BUEs supported on countertop x10     General Comments        Pertinent Vitals/Pain Pain Assessment: 0-10 Pain Score: 5  Pain Location: headache Pain Descriptors / Indicators: Headache Pain Intervention(s): Limited activity within patient's tolerance;Monitored during session    Home Living                      Prior Function  PT Goals (current goals can now be found in the care plan section) Acute Rehab PT Goals Patient Stated Goal: to get stronger PT Goal Formulation: With patient Time For Goal Achievement: 07/13/17 Potential to Achieve Goals: Good Progress towards PT goals: Progressing toward goals    Frequency    Min 2X/week      PT Plan Current plan remains appropriate    Co-evaluation              AM-PAC PT "6 Clicks"  Daily Activity  Outcome Measure  Difficulty turning over in bed (including adjusting bedclothes, sheets and blankets)?: None Difficulty moving from lying on back to sitting on the side of the bed? : None Difficulty sitting down on and standing up from a chair with arms (e.g., wheelchair, bedside commode, etc,.)?: A Little Help needed moving to and from a bed to chair (including a wheelchair)?: A Little Help needed walking in hospital room?: A Little Help needed climbing 3-5 steps with a railing? : A Little 6 Click Score: 20    End of Session Equipment Utilized During Treatment: Gait belt Activity Tolerance: Patient tolerated treatment well Patient left: in chair;with call bell/phone within reach;with chair alarm set;with family/visitor present Nurse Communication: Mobility status PT Visit Diagnosis: Muscle weakness (generalized) (M62.81);Unsteadiness on feet (R26.81)     Time: 7939-6886 PT Time Calculation (min) (ACUTE ONLY): 16 min  Charges:  $Therapeutic Exercise: 8-22 mins                    G Codes:       Collie Siad PT, DPT 06/30/2017, 1:43 PM

## 2017-06-30 NOTE — Progress Notes (Signed)
IV and tele removed from patient. Discharge instructions given to patient and daughter. Verbalized understanding. No distress at this time. Daughter is at bedside and will be transporting patient home.

## 2017-07-03 ENCOUNTER — Ambulatory Visit (INDEPENDENT_AMBULATORY_CARE_PROVIDER_SITE_OTHER): Payer: Medicare Other | Admitting: *Deleted

## 2017-07-03 DIAGNOSIS — I200 Unstable angina: Secondary | ICD-10-CM | POA: Diagnosis not present

## 2017-07-03 DIAGNOSIS — I340 Nonrheumatic mitral (valve) insufficiency: Secondary | ICD-10-CM | POA: Diagnosis not present

## 2017-07-03 DIAGNOSIS — Z5181 Encounter for therapeutic drug level monitoring: Secondary | ICD-10-CM

## 2017-07-03 LAB — POCT INR: INR: 3.2

## 2017-07-08 ENCOUNTER — Ambulatory Visit (INDEPENDENT_AMBULATORY_CARE_PROVIDER_SITE_OTHER): Payer: Medicare Other | Admitting: *Deleted

## 2017-07-08 DIAGNOSIS — Z5181 Encounter for therapeutic drug level monitoring: Secondary | ICD-10-CM | POA: Diagnosis not present

## 2017-07-08 DIAGNOSIS — I340 Nonrheumatic mitral (valve) insufficiency: Secondary | ICD-10-CM

## 2017-07-08 LAB — POCT INR: INR: 1.8

## 2017-07-10 ENCOUNTER — Telehealth: Payer: Self-pay | Admitting: Internal Medicine

## 2017-07-10 NOTE — Telephone Encounter (Signed)
Called to check the status of the referral to Endocrinology. Call patient to advise.

## 2017-07-22 ENCOUNTER — Ambulatory Visit (INDEPENDENT_AMBULATORY_CARE_PROVIDER_SITE_OTHER): Payer: Medicare Other

## 2017-07-22 DIAGNOSIS — I340 Nonrheumatic mitral (valve) insufficiency: Secondary | ICD-10-CM

## 2017-07-22 DIAGNOSIS — Z5181 Encounter for therapeutic drug level monitoring: Secondary | ICD-10-CM

## 2017-07-22 DIAGNOSIS — I200 Unstable angina: Secondary | ICD-10-CM

## 2017-07-22 LAB — POCT INR: INR: 3.2

## 2017-08-05 ENCOUNTER — Ambulatory Visit (INDEPENDENT_AMBULATORY_CARE_PROVIDER_SITE_OTHER): Payer: Medicare Other

## 2017-08-05 DIAGNOSIS — I340 Nonrheumatic mitral (valve) insufficiency: Secondary | ICD-10-CM

## 2017-08-05 DIAGNOSIS — Z5181 Encounter for therapeutic drug level monitoring: Secondary | ICD-10-CM

## 2017-08-05 DIAGNOSIS — I200 Unstable angina: Secondary | ICD-10-CM

## 2017-08-05 LAB — POCT INR: INR: 2.1

## 2017-08-13 NOTE — Addendum Note (Signed)
Addendum  created 08/13/17 1057 by Roberts Gaudy, MD   Sign clinical note

## 2017-08-19 ENCOUNTER — Ambulatory Visit (HOSPITAL_BASED_OUTPATIENT_CLINIC_OR_DEPARTMENT_OTHER)
Admission: RE | Admit: 2017-08-19 | Discharge: 2017-08-19 | Disposition: A | Discharge status: Home / Self Care | Payer: Medicare Other | Source: Ambulatory Visit | Attending: Internal Medicine | Admitting: Internal Medicine

## 2017-08-19 ENCOUNTER — Ambulatory Visit (HOSPITAL_COMMUNITY)
Admission: RE | Admit: 2017-08-19 | Discharge: 2017-08-19 | Disposition: A | Discharge status: Home / Self Care | Payer: Medicare Other | Source: Ambulatory Visit | Attending: Internal Medicine | Admitting: Internal Medicine

## 2017-08-19 VITALS — BP 154/83 | HR 63 | Wt 136.5 lb

## 2017-08-19 DIAGNOSIS — I130 Hypertensive heart and chronic kidney disease with heart failure and stage 1 through stage 4 chronic kidney disease, or unspecified chronic kidney disease: Secondary | ICD-10-CM | POA: Diagnosis not present

## 2017-08-19 DIAGNOSIS — I480 Paroxysmal atrial fibrillation: Secondary | ICD-10-CM | POA: Diagnosis not present

## 2017-08-19 DIAGNOSIS — I082 Rheumatic disorders of both aortic and tricuspid valves: Secondary | ICD-10-CM | POA: Diagnosis not present

## 2017-08-19 DIAGNOSIS — I2510 Atherosclerotic heart disease of native coronary artery without angina pectoris: Secondary | ICD-10-CM | POA: Diagnosis not present

## 2017-08-19 DIAGNOSIS — I5022 Chronic systolic (congestive) heart failure: Secondary | ICD-10-CM | POA: Insufficient documentation

## 2017-08-19 DIAGNOSIS — I252 Old myocardial infarction: Secondary | ICD-10-CM | POA: Insufficient documentation

## 2017-08-19 DIAGNOSIS — E785 Hyperlipidemia, unspecified: Secondary | ICD-10-CM | POA: Insufficient documentation

## 2017-08-19 DIAGNOSIS — I509 Heart failure, unspecified: Secondary | ICD-10-CM | POA: Diagnosis present

## 2017-08-19 DIAGNOSIS — R29898 Other symptoms and signs involving the musculoskeletal system: Secondary | ICD-10-CM | POA: Diagnosis not present

## 2017-08-19 DIAGNOSIS — Z953 Presence of xenogenic heart valve: Secondary | ICD-10-CM | POA: Diagnosis not present

## 2017-08-19 DIAGNOSIS — Z79899 Other long term (current) drug therapy: Secondary | ICD-10-CM | POA: Insufficient documentation

## 2017-08-19 DIAGNOSIS — Z951 Presence of aortocoronary bypass graft: Secondary | ICD-10-CM | POA: Diagnosis not present

## 2017-08-19 DIAGNOSIS — Z952 Presence of prosthetic heart valve: Secondary | ICD-10-CM

## 2017-08-19 DIAGNOSIS — E1122 Type 2 diabetes mellitus with diabetic chronic kidney disease: Secondary | ICD-10-CM | POA: Insufficient documentation

## 2017-08-19 DIAGNOSIS — Z7901 Long term (current) use of anticoagulants: Secondary | ICD-10-CM | POA: Diagnosis not present

## 2017-08-19 DIAGNOSIS — N184 Chronic kidney disease, stage 4 (severe): Secondary | ICD-10-CM | POA: Insufficient documentation

## 2017-08-19 LAB — COMPREHENSIVE METABOLIC PANEL
ALT: 36 U/L (ref 14–54)
AST: 44 U/L — AB (ref 15–41)
Albumin: 3.1 g/dL — ABNORMAL LOW (ref 3.5–5.0)
Alkaline Phosphatase: 86 U/L (ref 38–126)
Anion gap: 8 (ref 5–15)
BILIRUBIN TOTAL: 1.6 mg/dL — AB (ref 0.3–1.2)
BUN: 28 mg/dL — AB (ref 6–20)
CO2: 23 mmol/L (ref 22–32)
CREATININE: 3.21 mg/dL — AB (ref 0.44–1.00)
Calcium: 11.1 mg/dL — ABNORMAL HIGH (ref 8.9–10.3)
Chloride: 107 mmol/L (ref 101–111)
GFR, EST AFRICAN AMERICAN: 15 mL/min — AB (ref >60)
GFR, EST NON AFRICAN AMERICAN: 13 mL/min — AB (ref >60)
Glucose, Bld: 111 mg/dL — ABNORMAL HIGH (ref 65–99)
POTASSIUM: 3.7 mmol/L (ref 3.5–5.1)
Sodium: 138 mmol/L (ref 135–145)
TOTAL PROTEIN: 5.9 g/dL — AB (ref 6.5–8.1)

## 2017-08-19 MED ORDER — FUROSEMIDE 20 MG PO TABS: 20.0000 mg | ORAL_TABLET | ORAL | 3 refills | Status: DC

## 2017-08-19 MED ORDER — ROSUVASTATIN CALCIUM 40 MG PO TABS: 40.0000 mg | ORAL_TABLET | Freq: Every day | ORAL | 3 refills | Status: DC

## 2017-08-19 NOTE — Progress Notes (Signed)
  Echocardiogram 2D Echocardiogram has been performed.  Glennie Bose 08/19/2017, 10:48 AM

## 2017-08-19 NOTE — Patient Instructions (Addendum)
Stop Atorovastatin  Stop Amiodarone  Start Rosuvastatin  40 mg (1 tab) daily at 6 pm   Start Lasix 40 mg (1 tab) Monday Wednesday and Friday   You have been referred to Cardiac Re[facility] at [facility], they will contact you to schedule   Your physician recommends that you schedule a follow-up appointment in: 2 months

## 2017-08-19 NOTE — Progress Notes (Signed)
Advanced Heart Failure Clinic Note   PCP: Dr. Ouida Sills Primary Cardiologist: Donell Tomkins  HPI:  [name] [name] is a 82 year old female with a history of type 2 diabetes mellitus, hypertension, hyperlipidemia and CKD.   Admiited on 02/03/2017 with NSTEMI. Emergent cardiac catheterization which showed three-vessel coronary artery disease. She developed profound shock with multi-system organ failure and found to have severe MR due to partial flail of MV. Underwent emergent CABG x 4 (Coronary artery bypass grafting x 4   Left internal mammary graft to the LAD  Sequential SVG to diagonal and OM  SVG to PDA and bioprosthetic MVR   Had prolonged post-op course with renal failure, PAF (resolved with amio), paralyzed left vocal cord and HF. Post-op Echo with EF 40% RV normal, and stable MVR. ECHO 3/4 EF 20-25% RV mild-moderately reduced. Trivial pericardial effusion.   On d/c was 152 pounds.  Cr 2.3  She presents today for HF follow up. In early July was admitted to [facility] with weakness and hypercalcemia. Creatinine up slightly and felt to be dry. Lasix stopped completely.  Occasional ankle edema. Weight stable. Appetite ok. Able to do all ADLs without problem. Walks with cane. + orthopnea. No PND   Echo today reviewed personally. EF ~ 30% MVR stable    Review of systems complete and found to be negative unless listed in HPI.    SH:  Social History   Social History  . Marital status: Married    Spouse name: N/A  . Number of children: N/A  . Years of education: N/A   Occupational History  . Not on file.   Social History Main Topics  . Smoking status: Never Smoker  . Smokeless tobacco: Never Used  . Alcohol use No  . Drug use: No  . Sexual activity: Not on file   Other Topics Concern  . Not on file   Social History Narrative  . No narrative on file    FH:  Family History  Problem Relation Age of Onset  . Diabetes Father   . Diabetes Sister     Past Medical History:    Diagnosis Date  . [name]    a. LHC 02/03/17 for unstable angina showed severe 3v CAD w/ sequential 70% & 80% p-mLAD, 99% mLCx, CTO mRCA, elevated LV filling pressure  . Colon polyp 2010  . Diabetes mellitus with complication ([place])   . Glaucoma   . Hypertension     Current Outpatient Prescriptions  Medication Sig Dispense Refill  . acetaminophen (TYLENOL) 500 MG tablet Take 500 mg by mouth every 6 (six) hours as needed for mild pain.     Marland Kitchen amiodarone (PACERONE) 200 MG tablet Take 1 tablet (200 mg total) by mouth daily. 14 tablet 0  . atorvastatin (LIPITOR) 80 MG tablet Take 1 tablet (80 mg total) by mouth daily at 6 PM. 14 tablet 0  . carvedilol (COREG) 3.125 MG tablet Take 1 tablet (3.125 mg total) by mouth 2 (two) times daily. 28 tablet 0  . hydrALAZINE (APRESOLINE) 50 MG tablet Take 0.5 tablets (25 mg total) by mouth 2 (two) times daily. HOLD IF systolic BP <681 60 tablet 0  . isosorbide mononitrate (IMDUR) 30 MG 24 hr tablet Take 1 tablet (30 mg total) by mouth daily. 14 tablet 0  . latanoprost (XALATAN) 0.005 % ophthalmic solution Place 1 drop into both eyes at bedtime.     Marland Kitchen spironolactone (ALDACTONE) 25 MG tablet Take 1 tablet (25 mg total) by mouth  daily. 14 tablet 0  . timolol (BETIMOL) 0.5 % ophthalmic solution Place 1 drop into both eyes 2 (two) times daily.    Marland Kitchen warfarin (COUMADIN) 1 MG tablet Take 2 tablets daily or As Directed (Patient taking differently: Take 1-2 mg by mouth See admin instructions. Take 1 mg on Tuesday, Thursday, Saturday and Sunday. Take 2 mg on Monday, Wednesday and Friday.) 60 tablet 1   No current facility-administered medications for this encounter.     Vitals:   08/19/17 1046  BP: (!) 154/83  Pulse: 63  SpO2: 97%  Weight: 136 lb 8 oz (61.9 kg)    Wt Readings from Last 3 Encounters:  08/19/17 136 lb 8 oz (61.9 kg)  06/29/17 139 lb 11.2 oz (63.4 kg)  05/27/17 142 lb (64.4 kg)    PHYSICAL EXAM:  General:  Thin elderly female.  Well appearing. No resp difficulty HEENT: normal Neck: supple. JVP 6 Carotids 2+ bilat; no bruits. No lymphadenopathy or thryomegaly appreciated. Cor: PMI nondisplaced. Regular rate & rhythm. No rubs, gallops or murmurs. Lungs: clear Abdomen: soft, nontender, nondistended. No hepatosplenomegaly. No bruits or masses. Good bowel sounds. Extremities: no cyanosis, clubbing, rash, edema Neuro: alert & orientedx3, cranial nerves grossly intact. moves all 4 extremities w/o difficulty. Affect pleasant   ASSESSMENT & PLAN:  1. NSTEMI with CAD [name] on 02/05/17 - No s/s of ischemia s/b CABG - No ASA due to stable CAD on coumadin.  - Continue  b-blocker. She would like to switch atorva to Crestor due to perceived SEs. - Refer to cardiac rehab at [facility]  2. Severe MR with flail segment s/p MVR c/ bioprosthetic valve. - Valve stable on echo  - Knows she will need SBE prophylaxis with any dental procedures.   3. Chronic systolic HF :  - Echo 8/10 EF 40% MVR ok. Echo 3/5 EF 20-25% Echo today reviewed personally EF 30% - Overall stable/improved. NYHA II-III - Volume status slightly elevated. Resume lasix 40 M,W,F  - Continue hydralazine to 50 mg TID.   - Continue imdur 30 mg daily.  - Continue carvedilol 3.125 bid - Renal function prohibits Entresto initiation  - EF 30%. We disussed ICD but have decided to defer given age, improving EF and comorbidities. - Reinforced fluid restriction to < 2 L daily, sodium restriction to less than 2000 mg daily, and the importance of daily weights.   4. CKD stage 4 - BMET today.  5. PAF, post-op - NSR on exam. - Stop amio. If remaining in NSR can stop warfarin at next visit.  - LFT's ok in March.  6. DM2 - Consider Lottie Rater, MD  11:15 AM

## 2017-08-21 ENCOUNTER — Telehealth (HOSPITAL_COMMUNITY): Payer: Self-pay

## 2017-08-21 NOTE — Telephone Encounter (Signed)
Left [facility] CR a message for them to call back [phone] opt 5

## 2017-08-26 ENCOUNTER — Other Ambulatory Visit (HOSPITAL_COMMUNITY): Payer: Self-pay

## 2017-08-26 DIAGNOSIS — I5022 Chronic systolic (congestive) heart failure: Secondary | ICD-10-CM

## 2017-08-27 ENCOUNTER — Ambulatory Visit (HOSPITAL_COMMUNITY)
Admission: RE | Admit: 2017-08-27 | Discharge: 2017-08-27 | Disposition: A | Discharge status: Home / Self Care | Payer: Medicare Other | Source: Ambulatory Visit | Attending: Internal Medicine | Admitting: Internal Medicine

## 2017-08-27 DIAGNOSIS — I5022 Chronic systolic (congestive) heart failure: Secondary | ICD-10-CM | POA: Insufficient documentation

## 2017-08-27 LAB — BASIC METABOLIC PANEL
ANION GAP: 10 (ref 5–15)
BUN: 37 mg/dL — ABNORMAL HIGH (ref 6–20)
CALCIUM: 9.7 mg/dL (ref 8.9–10.3)
CO2: 20 mmol/L — ABNORMAL LOW (ref 22–32)
Chloride: 108 mmol/L (ref 101–111)
Creatinine, Ser: 3.03 mg/dL — ABNORMAL HIGH (ref 0.44–1.00)
GFR, EST AFRICAN AMERICAN: 16 mL/min — AB (ref >60)
GFR, EST NON AFRICAN AMERICAN: 14 mL/min — AB (ref >60)
Glucose, Bld: 100 mg/dL — ABNORMAL HIGH (ref 65–99)
POTASSIUM: 3.3 mmol/L — AB (ref 3.5–5.1)
Sodium: 138 mmol/L (ref 135–145)

## 2017-08-28 LAB — PROTEIN ELECTROPHORESIS, SERUM
A/G Ratio: 1.2 (ref 0.7–1.7)
ALBUMIN ELP: 3 g/dL (ref 2.9–4.4)
ALPHA-1-GLOBULIN: 0.3 g/dL (ref 0.0–0.4)
Alpha-2-Globulin: 0.5 g/dL (ref 0.4–1.0)
BETA GLOBULIN: 0.9 g/dL (ref 0.7–1.3)
GAMMA GLOBULIN: 0.9 g/dL (ref 0.4–1.8)
Globulin, Total: 2.6 g/dL (ref 2.2–3.9)
M-Spike, %: 0.2 g/dL — ABNORMAL HIGH
TOTAL PROTEIN ELP: 5.6 g/dL — AB (ref 6.0–8.5)

## 2017-09-02 ENCOUNTER — Encounter (HOSPITAL_COMMUNITY): Payer: Self-pay | Admitting: *Deleted

## 2017-09-07 ENCOUNTER — Telehealth (HOSPITAL_COMMUNITY): Payer: Self-pay | Admitting: *Deleted

## 2017-09-07 DIAGNOSIS — I25118 Atherosclerotic heart disease of native coronary artery with other forms of angina pectoris: Secondary | ICD-10-CM

## 2017-09-07 MED ORDER — POTASSIUM CHLORIDE ER 20 MEQ PO TBCR: 40.0000 meq | EXTENDED_RELEASE_TABLET | ORAL | 0 refills | Status: DC | PRN

## 2017-09-07 NOTE — Telephone Encounter (Signed)
Basic metabolic panel  Order: [id number]  Status:  Final result Visible to patient:  No (Not Released) Dx:  Chronic systolic heart failure ([place])  Notes recorded by Darron Doom, RN on 09/07/2017 at 8:47 AM EDT Patient called back and she is agreeable to take potassium 40 mEq once. Due to the weather she said she would not be able to pick up medication until tomorrow but will take it then. I have scheduled her for lab work next Tuesday. Potassium sent to pharmacy, no further questions. ------  Notes recorded by Harvie Junior, CMA on 09/02/2017 at 1:30 PM EDT No answer no voicemail. Letter mailed ------  Notes recorded by Harvie Junior, CMA on 08/31/2017 at 3:54 PM EDT Left VM for pt to call for lab results ------  Notes recorded by Larey Dresser, MD on 08/30/2017 at 11:11 PM EDT Possible monoclonal immunoglobulin. Need to send serum and urine immunofixation. ------  Notes recorded by Jolaine Artist, MD on 08/30/2017 at 8:56 PM EDT Improving. Have her take kcl 40 x 1. Repeat BMET 1 week. ------  Notes recorded by Scarlette Calico, RN on 08/27/2017 at 4:42 PM EDT CR improved from last week, spep/upep in process, will send to Dr Haroldine Laws as he ordered repeat labs ------  Notes recorded by Larey Dresser, MD on 08/27/2017 at 2:43 PM EDT Stable creatinine, increase K in diet.

## 2017-09-09 ENCOUNTER — Ambulatory Visit (INDEPENDENT_AMBULATORY_CARE_PROVIDER_SITE_OTHER): Payer: Medicare Other

## 2017-09-09 DIAGNOSIS — I340 Nonrheumatic mitral (valve) insufficiency: Secondary | ICD-10-CM | POA: Diagnosis not present

## 2017-09-09 DIAGNOSIS — Z5181 Encounter for therapeutic drug level monitoring: Secondary | ICD-10-CM | POA: Diagnosis not present

## 2017-09-09 LAB — POCT INR: INR: 1.8

## 2017-09-15 ENCOUNTER — Ambulatory Visit (HOSPITAL_COMMUNITY)
Admission: RE | Admit: 2017-09-15 | Discharge: 2017-09-15 | Disposition: A | Discharge status: Home / Self Care | Payer: Medicare Other | Source: Ambulatory Visit | Attending: Cardiology | Admitting: Cardiology

## 2017-09-15 DIAGNOSIS — I25118 Atherosclerotic heart disease of native coronary artery with other forms of angina pectoris: Secondary | ICD-10-CM

## 2017-09-15 LAB — BASIC METABOLIC PANEL
ANION GAP: 5 (ref 5–15)
BUN: 33 mg/dL — AB (ref 6–20)
CO2: 23 mmol/L (ref 22–32)
CREATININE: 3.27 mg/dL — AB (ref 0.44–1.00)
Calcium: 10.8 mg/dL — ABNORMAL HIGH (ref 8.9–10.3)
Chloride: 110 mmol/L (ref 101–111)
GFR calc Af Amer: 14 mL/min — ABNORMAL LOW (ref >60)
GFR calc non Af Amer: 12 mL/min — ABNORMAL LOW (ref >60)
GLUCOSE: 123 mg/dL — AB (ref 65–99)
POTASSIUM: 3.8 mmol/L (ref 3.5–5.1)
SODIUM: 138 mmol/L (ref 135–145)

## 2017-09-17 ENCOUNTER — Encounter: Payer: Self-pay | Admitting: *Deleted

## 2017-09-17 ENCOUNTER — Encounter: Payer: Medicare Other | Attending: Internal Medicine | Admitting: *Deleted

## 2017-09-17 VITALS — Ht 65.0 in | Wt 135.5 lb

## 2017-09-17 DIAGNOSIS — Z951 Presence of aortocoronary bypass graft: Secondary | ICD-10-CM | POA: Insufficient documentation

## 2017-09-17 DIAGNOSIS — I5022 Chronic systolic (congestive) heart failure: Secondary | ICD-10-CM | POA: Diagnosis not present

## 2017-09-17 NOTE — Progress Notes (Signed)
Daily Session Note  Patient Details  Name: SITLALY GUDIEL MRN: [id number] Date of Birth: 29-Nov-1935 Referring Provider:     Cardiac Rehab from 09/17/2017 in [facility]  Referring Provider  Bensimhon      Encounter Date: 09/17/2017  Check In:     Session Check In - 09/17/17 1352      Check-In   Location [facility]   Staff Present Darel Hong, RN Vickki Hearing, BA, ACSM CEP, Exercise Physiologist   Supervising physician immediately available to respond to emergencies See telemetry face sheet for immediately available ER MD   Medication changes reported     No  Did not have her medication list, will bring to first session.   Fall or balance concerns reported    No   Tobacco Cessation No Change   Warm-up and Cool-down Performed as group-led instruction   Resistance Training Performed Yes   VAD Patient? No     Pain Assessment   Currently in Pain? No/denies   Multiple Pain Sites No           Exercise Prescription Changes - 09/17/17 1400      Response to Exercise   Blood Pressure (Admit) 136/58   Blood Pressure (Exercise) 134/60   Blood Pressure (Exit) 134/72   Heart Rate (Admit) 81 bpm   Heart Rate (Exercise) 116 bpm   Heart Rate (Exit) 80 bpm   Oxygen Saturation (Exit) 100 %   Rating of Perceived Exertion (Exercise) 11      History  Smoking Status  . Never Smoker  Smokeless Tobacco  . Never Used    Goals Met:  Independence with exercise equipment Personal goals reviewed Strength training completed today  Goals Unmet:  Not Applicable  Comments: Med Review   Dr. Emily Filbert is Medical Director for [facility].

## 2017-09-17 NOTE — Progress Notes (Signed)
Cardiac Individual Treatment Plan  Patient Details  Name: [name] [name] MRN: [id number] Date of Birth: 10/30/1935 Referring Provider:     Cardiac Rehab from 09/17/2017 in [facility] Cardiac and Pulmonary Rehab  Referring Provider  Bensimhon      Initial Encounter Date:    Cardiac Rehab from 09/17/2017 in [facility] Cardiac and Pulmonary Rehab  Date  09/17/17  Referring Provider  Bensimhon      Visit Diagnosis: [name]  Heart failure, chronic systolic ([place])  Patient's Home Medications on Admission:  Current Outpatient Prescriptions:  .  acetaminophen (TYLENOL) 500 MG tablet, Take 500 mg by mouth every 6 (six) hours as needed for mild pain. , Disp: , Rfl:  .  furosemide (LASIX) 20 MG tablet, Take 1 tablet (20 mg total) by mouth 3 (three) times a week. Take Monday Wednesday Friday, Disp: 40 tablet, Rfl: 3 .  hydrALAZINE (APRESOLINE) 50 MG tablet, Take 0.5 tablets (25 mg total) by mouth 2 (two) times daily. HOLD IF systolic BP <419, Disp: 60 tablet, Rfl: 0 .  isosorbide mononitrate (IMDUR) 30 MG 24 hr tablet, Take 1 tablet (30 mg total) by mouth daily., Disp: 14 tablet, Rfl: 0 .  latanoprost (XALATAN) 0.005 % ophthalmic solution, Place 1 drop into both eyes at bedtime. , Disp: , Rfl:  .  potassium chloride 20 MEQ TBCR, Take 40 mEq by mouth as needed. Take only as directed, Disp: 10 tablet, Rfl: 0 .  rosuvastatin (CRESTOR) 40 MG tablet, Take 1 tablet (40 mg total) by mouth daily., Disp: 90 tablet, Rfl: 3 .  spironolactone (ALDACTONE) 25 MG tablet, Take 1 tablet (25 mg total) by mouth daily., Disp: 14 tablet, Rfl: 0 .  timolol (BETIMOL) 0.5 % ophthalmic solution, Place 1 drop into both eyes 2 (two) times daily., Disp: , Rfl:  .  warfarin (COUMADIN) 1 MG tablet, Take 2 tablets daily or As Directed (Patient taking differently: Take 1-2 mg by mouth See admin instructions. Take 1 mg on Tuesday, Thursday, Saturday and Sunday. Take 2 mg on Monday, Wednesday and Friday.), Disp: 60 tablet, Rfl: 1 .   carvedilol (COREG) 3.125 MG tablet, Take 1 tablet (3.125 mg total) by mouth 2 (two) times daily., Disp: 28 tablet, Rfl: 0  Past Medical History: Past Medical History:  Diagnosis Date  . [name]    a. LHC 02/03/17 for unstable angina showed severe 3v CAD w/ sequential 70% & 80% p-mLAD, 99% mLCx, CTO mRCA, elevated LV filling pressure  . Colon polyp 2010  . Diabetes mellitus with complication [place])   . Glaucoma   . Hypertension     Tobacco Use: History  Smoking Status  . Never Smoker  Smokeless Tobacco  . Never Used    Labs: Recent Review Flowsheet Data    Labs for ITP Cardiac and Pulmonary Rehab Latest Ref Rng & Units 03/01/2017 03/02/2017 03/03/2017 03/04/2017 03/05/2017   Cholestrol 0 - 200 mg/dL - - - - -   LDLCALC 0 - 99 mg/dL - - - - -   HDL >40 mg/dL - - - - -   Trlycerides <150 mg/dL - - - - -   Hemoglobin A1c 4.8 - 5.6 % - - - - -   PHART 7.350 - 7.450 - - - - -   PCO2ART 32.0 - 48.0 mmHg - - - - -   HCO3 20.0 - 28.0 mmol/L - - - - -   TCO2 0 - 100 mmol/L - - - - -  ACIDBASEDEF 0.0 - 2.0 mmol/L - - - - -   O2SAT % 69.5 68.3 58.5 61.0 74.5       Exercise Target Goals: Date: 09/17/17  Exercise Program Goal: Individual exercise prescription set with THRR, safety & activity barriers. Participant demonstrates ability to understand and report RPE using BORG scale, to self-measure pulse accurately, and to acknowledge the importance of the exercise prescription.  Exercise Prescription Goal: Starting with aerobic activity 30 plus minutes a day, 3 days per week for initial exercise prescription. Provide home exercise prescription and guidelines that participant acknowledges understanding prior to discharge.  Activity Barriers & Risk Stratification:     Activity Barriers & Cardiac Risk Stratification - 09/17/17 1426      Activity Barriers & Cardiac Risk Stratification   Activity Barriers Back Problems;Balance Concerns;Assistive Device;Other (comment)    Comments She has back pain and knee pain but is not being treated for it. It causes her to have balance issues but she uses a cane to help.    Cardiac Risk Stratification High      6 Minute Walk:     6 Minute Walk    Row Name 09/17/17 1429         6 Minute Walk   Phase Initial     Distance 725 feet     Walk Time 6 minutes     # of Rest Breaks 0     MPH 1.37     METS 1.76     RPE 11     VO2 Peak 6.18     Symptoms No     Resting HR 82 bpm     Resting BP 136/58     Exercise Oxygen Saturation  during 6 min walk 100 %     Max Ex. HR 116 bpm     Max Ex. BP 134/60     2 Minute Post BP 134/72        Oxygen Initial Assessment:   Oxygen Re-Evaluation:   Oxygen Discharge (Final Oxygen Re-Evaluation):   Initial Exercise Prescription:     Initial Exercise Prescription - 09/17/17 1400      Date of Initial Exercise RX and Referring Provider   Date 09/17/17   Referring Provider Bensimhon     Treadmill   MPH 1   Grade 0   Minutes 15   METs 1.77     NuStep   Level 1   SPM 80   Minutes 15   METs 1.7     Biostep-RELP   Level 1   SPM 50   Minutes 15   METs 1.7     Track   Laps 15   Minutes 15   METs 1.7     Prescription Details   Frequency (times per week) 3   Duration Progress to 45 minutes of aerobic exercise without signs/symptoms of physical distress     Intensity   THRR 40-80% of Max Heartrate 105-128   Ratings of Perceived Exertion 11-13   Perceived Dyspnea 0-4     Resistance Training   Training Prescription Yes   Weight 2 lb   Reps 10-15      Perform Capillary Blood Glucose checks as needed.  Exercise Prescription Changes:     Exercise Prescription Changes    Row Name 09/17/17 1400             Response to Exercise   Blood Pressure (Admit) 136/58       Blood Pressure (Exercise) 134/60  Blood Pressure (Exit) 134/72       Heart Rate (Admit) 81 bpm       Heart Rate (Exercise) 116 bpm       Heart Rate (Exit) 80 bpm        Oxygen Saturation (Exit) 100 %       Rating of Perceived Exertion (Exercise) 11          Exercise Comments:   Exercise Goals and Review:     Exercise Goals    Row Name 09/17/17 1427             Exercise Goals   Increase Physical Activity Yes       Intervention Provide advice, education, support and counseling about physical activity/exercise needs.;Develop an individualized exercise prescription for aerobic and resistive training based on initial evaluation findings, risk stratification, comorbidities and participant's personal goals.       Expected Outcomes Achievement of increased cardiorespiratory fitness and enhanced flexibility, muscular endurance and strength shown through measurements of functional capacity and personal statement of participant.       Increase Strength and Stamina Yes       Intervention Provide advice, education, support and counseling about physical activity/exercise needs.;Develop an individualized exercise prescription for aerobic and resistive training based on initial evaluation findings, risk stratification, comorbidities and participant's personal goals.       Expected Outcomes Achievement of increased cardiorespiratory fitness and enhanced flexibility, muscular endurance and strength shown through measurements of functional capacity and personal statement of participant.       Able to understand and use rate of perceived exertion (RPE) scale Yes       Intervention Provide education and explanation on how to use RPE scale       Expected Outcomes Short Term: Able to use RPE daily in rehab to express subjective intensity level;Long Term:  Able to use RPE to guide intensity level when exercising independently       Able to understand and use Dyspnea scale Yes       Intervention Provide education and explanation on how to use Dyspnea scale       Expected Outcomes Short Term: Able to use Dyspnea scale daily in rehab to express subjective sense of shortness of  breath during exertion;Long Term: Able to use Dyspnea scale to guide intensity level when exercising independently       Knowledge and understanding of Target Heart Rate Range (THRR) Yes       Intervention Provide education and explanation of THRR including how the numbers were predicted and where they are located for reference       Expected Outcomes Short Term: Able to state/look up THRR;Long Term: Able to use THRR to govern intensity when exercising independently;Short Term: Able to use daily as guideline for intensity in rehab       Able to check pulse independently Yes       Intervention Provide education and demonstration on how to check pulse in carotid and radial arteries.;Review the importance of being able to check your own pulse for safety during independent exercise       Expected Outcomes Short Term: Able to explain why pulse checking is important during independent exercise;Long Term: Able to check pulse independently and accurately       Understanding of Exercise Prescription Yes       Intervention Provide education, explanation, and written materials on patient's individual exercise prescription       Expected Outcomes Short Term: Able to  explain program exercise prescription;Long Term: Able to explain home exercise prescription to exercise independently          Exercise Goals Re-Evaluation :   Discharge Exercise Prescription (Final Exercise Prescription Changes):     Exercise Prescription Changes - 09/17/17 1400      Response to Exercise   Blood Pressure (Admit) 136/58   Blood Pressure (Exercise) 134/60   Blood Pressure (Exit) 134/72   Heart Rate (Admit) 81 bpm   Heart Rate (Exercise) 116 bpm   Heart Rate (Exit) 80 bpm   Oxygen Saturation (Exit) 100 %   Rating of Perceived Exertion (Exercise) 11      Nutrition:  Target Goals: Understanding of nutrition guidelines, daily intake of sodium 1500mg , cholesterol 200mg , calories 30% from fat and 7% or less from  saturated fats, daily to have 5 or more servings of fruits and vegetables.  Biometrics:     Pre Biometrics - 09/17/17 1427      Pre Biometrics   Height 5\' 5"  (1.651 m)   Weight 135 lb 8 oz (61.5 kg)   Waist Circumference 29 inches   Hip Circumference 38 inches   Waist to Hip Ratio 0.76 %   BMI (Calculated) 22.55       Nutrition Therapy Plan and Nutrition Goals:   Nutrition Discharge: Rate Your Plate Scores:     Nutrition Assessments - 09/17/17 1404      MEDFICTS Scores   Pre Score --  Patient did not complete. will complete on first session      Nutrition Goals Re-Evaluation:   Nutrition Goals Discharge (Final Nutrition Goals Re-Evaluation):   Psychosocial: Target Goals: Acknowledge presence or absence of significant depression and/or stress, maximize coping skills, provide positive support system. Participant is able to verbalize types and ability to use techniques and skills needed for reducing stress and depression.   Initial Review & Psychosocial Screening:     Initial Psych Review & Screening - 09/17/17 1405      Initial Review   Current issues with Current Stress Concerns   Source of Stress Concerns Chronic Illness;Transportation;Unable to participate in former interests or hobbies   Comments Her husband has dementia and since her event and surgery he has been moved to [facility] facility which has taken a lot of stress off of her due to not being his sole caregiver. She still misses having him in the home but visits him almost daily. She lives alone and drives herself so that can be a stressor at times due to having intraoccular lens implants. She would like to attend church regularly again but feels she does not want to drive more than she has to and gets too fatigued once she is there. Once she gets some stamina back we discussed having her find a ride to church.     Family Dynamics   Good Support System? Yes   Comments Her husband has dementia and is  [place], her daughter lives in [place] but will come visit regularly and stay for longer visits to help at times.      Barriers   Psychosocial barriers to participate in program The patient should benefit from training in stress management and relaxation.     Screening Interventions   Interventions Yes;Encouraged to exercise;Program counselor consult;Provide feedback about the scores to participant   Expected Outcomes Short Term goal: Utilizing psychosocial counselor, staff and physician to assist with identification of specific Stressors or current issues interfering with healing process. Setting desired goal for  each stressor or current issue identified.;Long Term Goal: Stressors or current issues are controlled or eliminated.;Short Term goal: Identification and review with participant of any Quality of Life or Depression concerns found by scoring the questionnaire.;Long Term goal: The participant improves quality of Life and PHQ9 Scores as seen by post scores and/or verbalization of changes      Quality of Life Scores:      Quality of Life - 09/17/17 1412      Quality of Life Scores   Health/Function Pre 17.1 %   Socioeconomic Pre 21.92 %   Psych/Spiritual Pre 22.67 %   Family Pre 24.88 %   GLOBAL Pre 20.11 %      PHQ-9: Recent Review Flowsheet Data    Depression screen [facility] 2/9 09/17/2017   Decreased Interest 0   Down, Depressed, Hopeless 0   PHQ - 2 Score 0   Altered sleeping 0   Tired, decreased energy 1   Change in appetite 0   Feeling bad or failure about yourself  0   Trouble concentrating 1   Moving slowly or fidgety/restless 1   Suicidal thoughts 0   PHQ-9 Score 3   Difficult doing work/chores Not difficult at all     Interpretation of Total Score  Total Score Depression Severity:  1-4 = Minimal depression, 5-9 = Mild depression, 10-14 = Moderate depression, 15-19 = Moderately severe depression, 20-27 = Severe depression   Psychosocial Evaluation and  Intervention:   Psychosocial Re-Evaluation:   Psychosocial Discharge (Final Psychosocial Re-Evaluation):   Vocational Rehabilitation: Provide vocational rehab assistance to qualifying candidates.   Vocational Rehab Evaluation & Intervention:     Vocational Rehab - 09/17/17 1423      Initial Vocational Rehab Evaluation & Intervention   Assessment shows need for Vocational Rehabilitation No      Education: Education Goals: Education classes will be provided on a variety of topics geared toward better understanding of heart health and risk factor modification. Participant will state understanding/return demonstration of topics presented as noted by education test scores.  Learning Barriers/Preferences:     Learning Barriers/Preferences - 09/17/17 1421      Learning Barriers/Preferences   Learning Barriers Sight;Exercise Concerns  Has bilateral intraoccular lens implants, has appointment with optometrist tomorrow.  Does not walk well so has concerns about exercise and being able to do it.    Learning Preferences None      Education Topics: General Nutrition Guidelines/Fats and Fiber: -Group instruction provided by verbal, written material, models and posters to present the general guidelines for heart healthy nutrition. Gives an explanation and review of dietary fats and fiber.   Controlling Sodium/Reading Food Labels: -Group verbal and written material supporting the discussion of sodium use in heart healthy nutrition. Review and explanation with models, verbal and written materials for utilization of the food label.   Exercise Physiology & Risk Factors: - Group verbal and written instruction with models to review the exercise physiology of the cardiovascular system and associated critical values. Details cardiovascular disease risk factors and the goals associated with each risk factor.   Aerobic Exercise & Resistance Training: - Gives group verbal and written  discussion on the health impact of inactivity. On the components of aerobic and resistive training programs and the benefits of this training and how to safely progress through these programs.   Flexibility, Balance, General Exercise Guidelines: - Provides group verbal and written instruction on the benefits of flexibility and balance training programs. Provides general exercise guidelines with  specific guidelines to those with heart or lung disease. Demonstration and skill practice provided.   Stress Management: - Provides group verbal and written instruction about the health risks of elevated stress, cause of high stress, and healthy ways to reduce stress.   Depression: - Provides group verbal and written instruction on the correlation between heart/lung disease and depressed mood, treatment options, and the stigmas associated with seeking treatment.   Anatomy & Physiology of the Heart: - Group verbal and written instruction and models provide basic cardiac anatomy and physiology, with the coronary electrical and arterial systems. Review of: AMI, Angina, Valve disease, Heart Failure, Cardiac Arrhythmia, Pacemakers, and the ICD.   Cardiac Procedures: - Group verbal and written instruction to review commonly prescribed medications for heart disease. Reviews the medication, class of the drug, and side effects. Includes the steps to properly store meds and maintain the prescription regimen. (beta blockers and nitrates)   Cardiac Medications I: - Group verbal and written instruction to review commonly prescribed medications for heart disease. Reviews the medication, class of the drug, and side effects. Includes the steps to properly store meds and maintain the prescription regimen.   Cardiac Medications II: -Group verbal and written instruction to review commonly prescribed medications for heart disease. Reviews the medication, class of the drug, and side effects. (all other drug  classes)    Go Sex-Intimacy & Heart Disease, Get SMART - Goal Setting: - Group verbal and written instruction through game format to discuss heart disease and the return to sexual intimacy. Provides group verbal and written material to discuss and apply goal setting through the application of the S.M.A.R.T. Method.   Other Matters of the Heart: - Provides group verbal, written materials and models to describe Heart Failure, Angina, Valve Disease, Peripheral Artery Disease, and Diabetes in the realm of heart disease. Includes description of the disease process and treatment options available to the cardiac patient.   Exercise & Equipment Safety: - Individual verbal instruction and demonstration of equipment use and safety with use of the equipment.   Cardiac Rehab from 09/17/2017 in [facility] Cardiac and Pulmonary Rehab  Date  09/17/17  Educator  [place]  Instruction Review Code  1- Verbalizes Understanding      Infection Prevention: - Provides verbal and written material to individual with discussion of infection control including proper hand washing and proper equipment cleaning during exercise session.   Cardiac Rehab from 09/17/2017 in [facility] Cardiac and Pulmonary Rehab  Date  09/17/17  Educator  KS  Instruction Review Code  1- Verbalizes Understanding      Falls Prevention: - Provides verbal and written material to individual with discussion of falls prevention and safety.   Cardiac Rehab from 09/17/2017 in [facility] Cardiac and Pulmonary Rehab  Date  09/17/17  Educator  KS  Instruction Review Code  1- Verbalizes Understanding      Diabetes: - Individual verbal and written instruction to review signs/symptoms of diabetes, desired ranges of glucose level fasting, after meals and with exercise. Acknowledge that pre and post exercise glucose checks will be done for 3 sessions at entry of program.   Cardiac Rehab from 09/17/2017 in [facility] Cardiac and Pulmonary Rehab  Date  09/17/17  Educator  KS   Instruction Review Code  1- Verbalizes Understanding      Other: -Provides group and verbal instruction on various topics (see comments)    Knowledge Questionnaire Score:     Knowledge Questionnaire Score - 09/17/17 1423  Knowledge Questionnaire Score   Pre Score 23/28  Answers reviewed with patient who verbalized understanding.       Core Components/Risk Factors/Patient Goals at Admission:     Personal Goals and Risk Factors at Admission - 09/17/17 1358      Core Components/Risk Factors/Patient Goals on Admission    Weight Management Weight Maintenance;Yes   Intervention Weight Management: Provide education and appropriate resources to help participant work on and attain dietary goals.;Weight Management: Develop a combined nutrition and exercise program designed to reach desired caloric intake, while maintaining appropriate intake of nutrient and fiber, sodium and fats, and appropriate energy expenditure required for the weight goal.   Admit Weight 135 lb 8 oz (61.5 kg)   Expected Outcomes Understanding recommendations for meals to include 15-35% energy as protein, 25-35% energy from fat, 35-60% energy from carbohydrates, less than 200mg  of dietary cholesterol, 20-35 gm of total fiber daily;Understanding of distribution of calorie intake throughout the day with the consumption of 4-5 meals/snacks;Weight Maintenance: Understanding of the daily nutrition guidelines, which includes 25-35% calories from fat, 7% or less cal from saturated fats, less than 200mg  cholesterol, less than 1.5gm of sodium, & 5 or more servings of fruits and vegetables daily   Diabetes Yes   Intervention Provide education about signs/symptoms and action to take for hypo/hyperglycemia.;Provide education about proper nutrition, including hydration, and aerobic/resistive exercise prescription along with prescribed medications to achieve blood glucose in normal ranges: Fasting glucose 65-99 mg/dL   Expected  Outcomes Short Term: Participant verbalizes understanding of the signs/symptoms and immediate care of hyper/hypoglycemia, proper foot care and importance of medication, aerobic/resistive exercise and nutrition plan for blood glucose control.;Long Term: Attainment of HbA1C < 7%.   Heart Failure Yes   Intervention Provide a combined exercise and nutrition program that is supplemented with education, support and counseling about heart failure. Directed toward relieving symptoms such as shortness of breath, decreased exercise tolerance, and extremity edema.   Expected Outcomes Improve functional capacity of life;Short term: Attendance in program 2-3 days a week with increased exercise capacity. Reported lower sodium intake. Reported increased fruit and vegetable intake. Reports medication compliance.;Short term: Daily weights obtained and reported for increase. Utilizing diuretic protocols set by physician.;Long term: Adoption of self-care skills and reduction of barriers for early signs and symptoms recognition and intervention leading to self-care maintenance.   Hypertension Yes   Intervention Provide education on lifestyle modifcations including regular physical activity/exercise, weight management, moderate sodium restriction and increased consumption of fresh fruit, vegetables, and low fat dairy, alcohol moderation, and smoking cessation.;Monitor prescription use compliance.   Expected Outcomes Short Term: Continued assessment and intervention until BP is < 140/72mm HG in hypertensive participants. < 130/81mm HG in hypertensive participants with diabetes, heart failure or chronic kidney disease.;Long Term: Maintenance of blood pressure at goal levels.   Lipids Yes   Intervention Provide education and support for participant on nutrition & aerobic/resistive exercise along with prescribed medications to achieve LDL 70mg , HDL >40mg .   Expected Outcomes Short Term: Participant states understanding of desired  cholesterol values and is compliant with medications prescribed. Participant is following exercise prescription and nutrition guidelines.;Long Term: Cholesterol controlled with medications as prescribed, with individualized exercise RX and with personalized nutrition plan. Value goals: LDL < 70mg , HDL > 40 mg.   Personal Goal Other Yes   Personal Goal She would like to feel well enough to attend church regularly and not feet poorly or fatigued during.    Intervention Patient will come to  cardiac rehab and work on Catering manager and strength.    Expected Outcomes She will gain enough strength and stamina to attend church regularly.       Core Components/Risk Factors/Patient Goals Review:    Core Components/Risk Factors/Patient Goals at Discharge (Final Review):    ITP Comments:     ITP Comments    Row Name 09/17/17 1354           ITP Comments Med review completed. Initial ITP created. Diagnosis information can be found in [facility] encouter dated 08/19/2017.          Comments: initial med review completed

## 2017-09-17 NOTE — Patient Instructions (Signed)
Patient Instructions  Patient Details  Name: [name] [name] MRN: [id number] Date of Birth: May 28, 1935 Referring Provider:  Jolaine Artist, MD  Below are the personal goals you chose as well as exercise and nutrition goals. Our goal is to help you keep on track towards obtaining and maintaining your goals. We will be discussing your progress on these goals with you throughout the program.  Initial Exercise Prescription:     Initial Exercise Prescription - 09/17/17 1400      Date of Initial Exercise RX and Referring Provider   Date 09/17/17   Referring Provider Bensimhon     Treadmill   MPH 1   Grade 0   Minutes 15   METs 1.77     NuStep   Level 1   SPM 80   Minutes 15   METs 1.7     Biostep-RELP   Level 1   SPM 50   Minutes 15   METs 1.7     Track   Laps 15   Minutes 15   METs 1.7     Prescription Details   Frequency (times per week) 3   Duration Progress to 45 minutes of aerobic exercise without signs/symptoms of physical distress     Intensity   THRR 40-80% of Max Heartrate 105-128   Ratings of Perceived Exertion 11-13   Perceived Dyspnea 0-4     Resistance Training   Training Prescription Yes   Weight 2 lb   Reps 10-15      Exercise Goals: Frequency: Be able to perform aerobic exercise three times per week working toward 3-5 days per week.  Intensity: Work with a perceived exertion of 11 (fairly light) - 15 (hard) as tolerated. Follow your new exercise prescription and watch for changes in prescription as you progress with the program. Changes will be reviewed with you when they are made.  Duration: You should be able to do 30 minutes of continuous aerobic exercise in addition to a 5 minute warm-up and a 5 minute cool-down routine.  Nutrition Goals: Your personal nutrition goals will be established when you do your nutrition analysis with the dietician.  The following are nutrition guidelines to follow: Cholesterol < 200mg /day Sodium <  1500mg /day Fiber: Women over 50 yrs - 21 grams per day  Personal Goals:     Personal Goals and Risk Factors at Admission - 09/17/17 1358      Core Components/Risk Factors/Patient Goals on Admission    Weight Management Weight Maintenance;Yes   Intervention Weight Management: Provide education and appropriate resources to help participant work on and attain dietary goals.;Weight Management: Develop a combined nutrition and exercise program designed to reach desired caloric intake, while maintaining appropriate intake of nutrient and fiber, sodium and fats, and appropriate energy expenditure required for the weight goal.   Admit Weight 135 lb 8 oz (61.5 kg)   Expected Outcomes Understanding recommendations for meals to include 15-35% energy as protein, 25-35% energy from fat, 35-60% energy from carbohydrates, less than 200mg  of dietary cholesterol, 20-35 gm of total fiber daily;Understanding of distribution of calorie intake throughout the day with the consumption of 4-5 meals/snacks;Weight Maintenance: Understanding of the daily nutrition guidelines, which includes 25-35% calories from fat, 7% or less cal from saturated fats, less than 200mg  cholesterol, less than 1.5gm of sodium, & 5 or more servings of fruits and vegetables daily   Diabetes Yes   Intervention Provide education about signs/symptoms and action to take for hypo/hyperglycemia.;Provide education about proper nutrition,  including hydration, and aerobic/resistive exercise prescription along with prescribed medications to achieve blood glucose in normal ranges: Fasting glucose 65-99 mg/dL   Expected Outcomes Short Term: Participant verbalizes understanding of the signs/symptoms and immediate care of hyper/hypoglycemia, proper foot care and importance of medication, aerobic/resistive exercise and nutrition plan for blood glucose control.;Long Term: Attainment of HbA1C < 7%.   Heart Failure Yes   Intervention Provide a combined exercise and  nutrition program that is supplemented with education, support and counseling about heart failure. Directed toward relieving symptoms such as shortness of breath, decreased exercise tolerance, and extremity edema.   Expected Outcomes Improve functional capacity of life;Short term: Attendance in program 2-3 days a week with increased exercise capacity. Reported lower sodium intake. Reported increased fruit and vegetable intake. Reports medication compliance.;Short term: Daily weights obtained and reported for increase. Utilizing diuretic protocols set by physician.;Long term: Adoption of self-care skills and reduction of barriers for early signs and symptoms recognition and intervention leading to self-care maintenance.   Hypertension Yes   Intervention Provide education on lifestyle modifcations including regular physical activity/exercise, weight management, moderate sodium restriction and increased consumption of fresh fruit, vegetables, and low fat dairy, alcohol moderation, and smoking cessation.;Monitor prescription use compliance.   Expected Outcomes Short Term: Continued assessment and intervention until BP is < 140/72mm HG in hypertensive participants. < 130/61mm HG in hypertensive participants with diabetes, heart failure or chronic kidney disease.;Long Term: Maintenance of blood pressure at goal levels.   Lipids Yes   Intervention Provide education and support for participant on nutrition & aerobic/resistive exercise along with prescribed medications to achieve LDL 70mg , HDL >40mg .   Expected Outcomes Short Term: Participant states understanding of desired cholesterol values and is compliant with medications prescribed. Participant is following exercise prescription and nutrition guidelines.;Long Term: Cholesterol controlled with medications as prescribed, with individualized exercise RX and with personalized nutrition plan. Value goals: LDL < 70mg , HDL > 40 mg.   Personal Goal Other Yes    Personal Goal She would like to feel well enough to attend church regularly and not feet poorly or fatigued during.    Intervention Patient will come to cardiac rehab and work on building stamina and strength.    Expected Outcomes She will gain enough strength and stamina to attend church regularly.       Tobacco Use Initial Evaluation: History  Smoking Status  . Never Smoker  Smokeless Tobacco  . Never Used    Exercise Goals and Review:     Exercise Goals    Row Name 09/17/17 1427             Exercise Goals   Increase Physical Activity Yes       Intervention Provide advice, education, support and counseling about physical activity/exercise needs.;Develop an individualized exercise prescription for aerobic and resistive training based on initial evaluation findings, risk stratification, comorbidities and participant's personal goals.       Expected Outcomes Achievement of increased cardiorespiratory fitness and enhanced flexibility, muscular endurance and strength shown through measurements of functional capacity and personal statement of participant.       Increase Strength and Stamina Yes       Intervention Provide advice, education, support and counseling about physical activity/exercise needs.;Develop an individualized exercise prescription for aerobic and resistive training based on initial evaluation findings, risk stratification, comorbidities and participant's personal goals.       Expected Outcomes Achievement of increased cardiorespiratory fitness and enhanced flexibility, muscular endurance and strength shown through measurements  of functional capacity and personal statement of participant.       Able to understand and use rate of perceived exertion (RPE) scale Yes       Intervention Provide education and explanation on how to use RPE scale       Expected Outcomes Short Term: Able to use RPE daily in rehab to express subjective intensity level;Long Term:  Able to use RPE  to guide intensity level when exercising independently       Able to understand and use Dyspnea scale Yes       Intervention Provide education and explanation on how to use Dyspnea scale       Expected Outcomes Short Term: Able to use Dyspnea scale daily in rehab to express subjective sense of shortness of breath during exertion;Long Term: Able to use Dyspnea scale to guide intensity level when exercising independently       Knowledge and understanding of Target Heart Rate Range (THRR) Yes       Intervention Provide education and explanation of THRR including how the numbers were predicted and where they are located for reference       Expected Outcomes Short Term: Able to state/look up THRR;Long Term: Able to use THRR to govern intensity when exercising independently;Short Term: Able to use daily as guideline for intensity in rehab       Able to check pulse independently Yes       Intervention Provide education and demonstration on how to check pulse in carotid and radial arteries.;Review the importance of being able to check your own pulse for safety during independent exercise       Expected Outcomes Short Term: Able to explain why pulse checking is important during independent exercise;Long Term: Able to check pulse independently and accurately       Understanding of Exercise Prescription Yes       Intervention Provide education, explanation, and written materials on patient's individual exercise prescription       Expected Outcomes Short Term: Able to explain program exercise prescription;Long Term: Able to explain home exercise prescription to exercise independently          Copy of goals given to participant.

## 2017-09-22 ENCOUNTER — Encounter: Payer: Medicare Other | Attending: Internal Medicine

## 2017-09-22 DIAGNOSIS — Z951 Presence of aortocoronary bypass graft: Secondary | ICD-10-CM | POA: Insufficient documentation

## 2017-09-22 DIAGNOSIS — I5022 Chronic systolic (congestive) heart failure: Secondary | ICD-10-CM | POA: Insufficient documentation

## 2017-09-22 LAB — GLUCOSE, CAPILLARY: GLUCOSE-CAPILLARY: 146 mg/dL — AB (ref 65–99)

## 2017-09-22 NOTE — Progress Notes (Signed)
Daily Session Note  Patient Details  Name: [name] [name] MRN: [id number] Date of Birth: [birth year] Referring Provider:     Cardiac Rehab from 09/17/2017 in [facility] Cardiac [facility] Pul[facility] Rehab  Referring Provider  Bensimhon      Encounter Date: 09/22/2017  Check In:     Session Check In - 09/22/17 0829      Check-In   Location [facility]-Cardiac & Pulmonary Rehab   Staff Present Nyoka Cowden, RN, BSN, Willette Pa, MA, ACSM RCEP, Exercise Physiologist;Daphine Loch Oletta Darter, [place], ACSM CEP, Exercise Physiologist   Supervising physician immediately available to respond to emergencies See telemetry face sheet for immediately available ER MD   Medication changes reported     No   Fall or balance concerns reported    No   Warm-up and Cool-down Performed on first and last piece of equipment   Resistance Training Performed Yes   VAD Patient? No     Pain Assessment   Currently in Pain? No/denies         History  Smoking Status  . Never Smoker  Smokeless Tobacco  . Never Used    Goals Met:  Independence with exercise equipment Exercise tolerated well No report of cardiac concerns or symptoms Strength training completed today  Goals Unmet:  Not Applicable  Comments: First full day of exercise!  Patient was oriented to gym and equipment including functions, settings, policies, and procedures.  Patient's individual exercise prescription and treatment plan were reviewed.  All starting workloads were established based on the results of the 6 minute walk test done at initial orientation visit.  The plan for exercise progression was also introduced and progression will be customized based on patient's performance and goals.    Dr. Emily Filbert is Medical Director for [place] and [facility].

## 2017-09-23 ENCOUNTER — Telehealth (HOSPITAL_COMMUNITY): Payer: Self-pay | Admitting: *Deleted

## 2017-09-23 ENCOUNTER — Ambulatory Visit (INDEPENDENT_AMBULATORY_CARE_PROVIDER_SITE_OTHER): Payer: Medicare Other

## 2017-09-23 DIAGNOSIS — Z5181 Encounter for therapeutic drug level monitoring: Secondary | ICD-10-CM

## 2017-09-23 DIAGNOSIS — I340 Nonrheumatic mitral (valve) insufficiency: Secondary | ICD-10-CM | POA: Diagnosis not present

## 2017-09-23 LAB — POCT INR: INR: 1.7

## 2017-09-23 MED ORDER — WARFARIN SODIUM 1 MG PO TABS: ORAL_TABLET | ORAL | 1 refills | Status: DC

## 2017-09-23 NOTE — Telephone Encounter (Signed)
Referral placed with [facility]

## 2017-09-24 ENCOUNTER — Other Ambulatory Visit (HOSPITAL_COMMUNITY): Payer: Self-pay | Admitting: Internal Medicine

## 2017-09-24 DIAGNOSIS — Z951 Presence of aortocoronary bypass graft: Secondary | ICD-10-CM

## 2017-09-24 DIAGNOSIS — I5022 Chronic systolic (congestive) heart failure: Secondary | ICD-10-CM | POA: Diagnosis not present

## 2017-09-24 LAB — GLUCOSE, CAPILLARY
GLUCOSE-CAPILLARY: 117 mg/dL — AB (ref 65–99)
Glucose-Capillary: 128 mg/dL — ABNORMAL HIGH (ref 65–99)

## 2017-09-24 NOTE — Progress Notes (Signed)
Daily Session Note  Patient Details  Name: [name] [name] MRN: [id number] Date of Birth: 06-Apr-1935 Referring Provider:     Cardiac Rehab from 09/17/2017 in [facility] Cardiac and Pulmonary Rehab  Referring Provider  Bensimhon      Encounter Date: 09/24/2017  Check In:     Session Check In - 09/24/17 0836      Check-In   Location [facility]-Cardiac & Pulmonary Rehab   Staff Present Alberteen Sam, MA, ACSM RCEP, Exercise Physiologist;Shakiyla Kook Oletta Darter, BA, ACSM CEP, Exercise Physiologist;Meredith Sherryll Burger, RN BSN   Supervising physician immediately available to respond to emergencies See telemetry face sheet for immediately available ER MD   Medication changes reported     No   Fall or balance concerns reported    No   Warm-up and Cool-down Performed on first and last piece of equipment   Resistance Training Performed Yes   VAD Patient? No     Pain Assessment   Currently in Pain? No/denies         History  Smoking Status  . Never Smoker  Smokeless Tobacco  . Never Used    Goals Met:  Independence with exercise equipment Exercise tolerated well Strength training completed today  Goals Unmet:  Not Applicable  Comments: Pt able to follow exercise prescription today without complaint.  Will continue to monitor for progression.   Dr. Emily Filbert is Medical Director for [place] and [facility].

## 2017-09-29 DIAGNOSIS — I5022 Chronic systolic (congestive) heart failure: Secondary | ICD-10-CM

## 2017-09-29 DIAGNOSIS — Z951 Presence of aortocoronary bypass graft: Secondary | ICD-10-CM

## 2017-09-29 LAB — GLUCOSE, CAPILLARY
Glucose-Capillary: 126 mg/dL — ABNORMAL HIGH (ref 65–99)
Glucose-Capillary: 125 mg/dL — ABNORMAL HIGH (ref 65–99)

## 2017-09-29 NOTE — Progress Notes (Signed)
Daily Session Note  Patient Details  Name: [name] [name] MRN: [id number] Date of Birth: August 28, 1935 Referring Provider:     Cardiac Rehab from 09/17/2017 in [facility] Cardiac and Pulmonary Rehab  Referring Provider  Bensimhon      Encounter Date: 09/29/2017  Check In:     Session Check In - 09/29/17 0855      Check-In   Location [facility]-Cardiac & Pulmonary Rehab   Staff Present Gerlene Burdock, RN, Vickki Hearing, BA, ACSM CEP, Exercise Physiologist;Jessica Luan Pulling, MA, ACSM RCEP, Exercise Physiologist   Supervising physician immediately available to respond to emergencies See telemetry face sheet for immediately available ER MD   Medication changes reported     No   Fall or balance concerns reported    No   Warm-up and Cool-down Performed on first and last piece of equipment   Resistance Training Performed Yes   VAD Patient? No     Pain Assessment   Currently in Pain? No/denies         History  Smoking Status  . Never Smoker  Smokeless Tobacco  . Never Used    Goals Met:  Independence with exercise equipment Exercise tolerated well No report of cardiac concerns or symptoms Strength training completed today  Goals Unmet:  Not Applicable  Comments: Pt able to follow exercise prescription today without complaint.  Will continue to monitor for progression.    Dr. Emily Filbert is Medical Director for [place] and [facility].

## 2017-09-30 ENCOUNTER — Telehealth (HOSPITAL_COMMUNITY): Payer: Self-pay | Admitting: Cardiology

## 2017-09-30 ENCOUNTER — Encounter: Payer: Self-pay | Admitting: *Deleted

## 2017-09-30 NOTE — Telephone Encounter (Signed)
Patient was referred to [facility], patient is scheduled with Dr Corliss Parish 10/14/17 @ 4pm  Copy of appt information mailed to patient

## 2017-09-30 NOTE — Progress Notes (Signed)
Cardiac Individual Treatment Plan  Patient Details  Name: [name] [name] MRN: [id number] Date of Birth: Apr 22, 1935 Referring Provider:     Cardiac Rehab from 09/17/2017 in [facility] Cardiac and Pulmonary Rehab  Referring Provider  Bensimhon      Initial Encounter Date:    Cardiac Rehab from 09/17/2017 in [facility] Cardiac and Pulmonary Rehab  Date  09/17/17  Referring Provider  Bensimhon      Visit Diagnosis: No diagnosis found.  Patient's Home Medications on Admission:  Current Outpatient Prescriptions:  .  acetaminophen (TYLENOL) 500 MG tablet, Take 500 mg by mouth every 6 (six) hours as needed for mild pain. , Disp: , Rfl:  .  carvedilol (COREG) 3.125 MG tablet, TAKE 1 TABLET BY MOUTH TWICE DAILY, Disp: 180 tablet, Rfl: 3 .  furosemide (LASIX) 20 MG tablet, Take 1 tablet (20 mg total) by mouth 3 (three) times a week. Take Monday Wednesday Friday, Disp: 40 tablet, Rfl: 3 .  hydrALAZINE (APRESOLINE) 50 MG tablet, Take 0.5 tablets (25 mg total) by mouth 2 (two) times daily. HOLD IF systolic BP <191, Disp: 60 tablet, Rfl: 0 .  isosorbide mononitrate (IMDUR) 30 MG 24 hr tablet, TAKE 1 TABLET BY MOUTH ONCE DAILY, Disp: 90 tablet, Rfl: 3 .  latanoprost (XALATAN) 0.005 % ophthalmic solution, Place 1 drop into both eyes at bedtime. , Disp: , Rfl:  .  potassium chloride 20 MEQ TBCR, Take 40 mEq by mouth as needed. Take only as directed, Disp: 10 tablet, Rfl: 0 .  rosuvastatin (CRESTOR) 40 MG tablet, Take 1 tablet (40 mg total) by mouth daily., Disp: 90 tablet, Rfl: 3 .  spironolactone (ALDACTONE) 25 MG tablet, Take 1 tablet (25 mg total) by mouth daily., Disp: 14 tablet, Rfl: 0 .  timolol (BETIMOL) 0.5 % ophthalmic solution, Place 1 drop into both eyes 2 (two) times daily., Disp: , Rfl:  .  warfarin (COUMADIN) 1 MG tablet, Take 2 tablets daily or As Directed, Disp: 60 tablet, Rfl: 1  Past Medical History: Past Medical History:  Diagnosis Date  . [name]    a. LHC 02/03/17 for  unstable angina showed severe 3v CAD w/ sequential 70% & 80% p-mLAD, 99% mLCx, CTO mRCA, elevated LV filling pressure  . Colon polyp 2010  . Diabetes mellitus with complication ([place])   . Glaucoma   . Hypertension     Tobacco Use: History  Smoking Status  . Never Smoker  Smokeless Tobacco  . Never Used    Labs: Recent Review Flowsheet Data    Labs for ITP Cardiac and Pulmonary Rehab Latest Ref Rng & Units 03/01/2017 03/02/2017 03/03/2017 03/04/2017 03/05/2017   Cholestrol 0 - 200 mg/dL - - - - -   LDLCALC 0 - 99 mg/dL - - - - -   HDL >40 mg/dL - - - - -   Trlycerides <150 mg/dL - - - - -   Hemoglobin A1c 4.8 - 5.6 % - - - - -   PHART 7.350 - 7.450 - - - - -   PCO2ART 32.0 - 48.0 mmHg - - - - -   HCO3 20.0 - 28.0 mmol/L - - - - -   TCO2 0 - 100 mmol/L - - - - -   ACIDBASEDEF 0.0 - 2.0 mmol/L - - - - -   O2SAT % 69.5 68.3 58.5 61.0 74.5       Exercise Target Goals:    Exercise Program Goal: Individual exercise prescription set with  THRR, safety & activity barriers. Participant demonstrates ability to understand and report RPE using BORG scale, to self-measure pulse accurately, and to acknowledge the importance of the exercise prescription.  Exercise Prescription Goal: Starting with aerobic activity 30 plus minutes a day, 3 days per week for initial exercise prescription. Provide home exercise prescription and guidelines that participant acknowledges understanding prior to discharge.  Activity Barriers & Risk Stratification:     Activity Barriers & Cardiac Risk Stratification - 09/17/17 1426      Activity Barriers & Cardiac Risk Stratification   Activity Barriers Back Problems;Balance Concerns;Assistive Device;Other (comment)   Comments She has back pain and knee pain but is not being treated for it. It causes her to have balance issues but she uses a cane to help.    Cardiac Risk Stratification High      6 Minute Walk:     6 Minute Walk    Row Name 09/17/17 1429          6 Minute Walk   Phase Initial     Distance 725 feet     Walk Time 6 minutes     # of Rest Breaks 0     MPH 1.37     METS 1.76     RPE 11     VO2 Peak 6.18     Symptoms No     Resting HR 82 bpm     Resting BP 136/58     Exercise Oxygen Saturation  during 6 min walk 100 %     Max Ex. HR 116 bpm     Max Ex. BP 134/60     2 Minute Post BP 134/72        Oxygen Initial Assessment:   Oxygen Re-Evaluation:   Oxygen Discharge (Final Oxygen Re-Evaluation):   Initial Exercise Prescription:     Initial Exercise Prescription - 09/17/17 1400      Date of Initial Exercise RX and Referring Provider   Date 09/17/17   Referring Provider Bensimhon     Treadmill   MPH 1   Grade 0   Minutes 15   METs 1.77     NuStep   Level 1   SPM 80   Minutes 15   METs 1.7     Biostep-RELP   Level 1   SPM 50   Minutes 15   METs 1.7     Track   Laps 15   Minutes 15   METs 1.7     Prescription Details   Frequency (times per week) 3   Duration Progress to 45 minutes of aerobic exercise without signs/symptoms of physical distress     Intensity   THRR 40-80% of Max Heartrate 105-128   Ratings of Perceived Exertion 11-13   Perceived Dyspnea 0-4     Resistance Training   Training Prescription Yes   Weight 2 lb   Reps 10-15      Perform Capillary Blood Glucose checks as needed.  Exercise Prescription Changes:     Exercise Prescription Changes    Row Name 09/17/17 1400 09/25/17 1300           Response to Exercise   Blood Pressure (Admit) 136/58 122/60      Blood Pressure (Exercise) 134/60 122/56      Blood Pressure (Exit) 134/72 120/60      Heart Rate (Admit) 81 bpm 72 bpm      Heart Rate (Exercise) 116 bpm 92 bpm  Heart Rate (Exit) 80 bpm 70 bpm      Oxygen Saturation (Exit) 100 %  -      Rating of Perceived Exertion (Exercise) 11 15      Symptoms  - none      Comments  - second full day of exercise      Duration  - Progress to 45 minutes of aerobic  exercise without signs/symptoms of physical distress      Intensity  - THRR unchanged        Progression   Progression  - Continue to progress workloads to maintain intensity without signs/symptoms of physical distress.      Average METs  - 2.4        Resistance Training   Training Prescription  - Yes      Weight  - 2 lb      Reps  - 10-15        Interval Training   Interval Training  - No        NuStep   Level  - 1      Minutes  - 15      METs  - 2.8        Biostep-RELP   Level  - 1      Minutes  - 15      METs  - 2        Track   Laps  - 7      Minutes  - 10         Exercise Comments:     Exercise Comments    Row Name 09/22/17 [phone] 09/29/17 0856         Exercise Comments First full day of exercise!  Patient was oriented to gym and equipment including functions, settings, policies, and procedures.  Patient's individual exercise prescription and treatment plan were reviewed.  All starting workloads were established based on the results of the 6 minute walk test done at initial orientation visit.  The plan for exercise progression was also introduced and progression will be customized based on patient's performance and goals. Elaysia increased to 1.5 mph on TM today,         Exercise Goals and Review:     Exercise Goals    Row Name 09/17/17 1427             Exercise Goals   Increase Physical Activity Yes       Intervention Provide advice, education, support and counseling about physical activity/exercise needs.;Develop an individualized exercise prescription for aerobic and resistive training based on initial evaluation findings, risk stratification, comorbidities and participant's personal goals.       Expected Outcomes Achievement of increased cardiorespiratory fitness and enhanced flexibility, muscular endurance and strength shown through measurements of functional capacity and personal statement of participant.       Increase Strength and Stamina Yes        Intervention Provide advice, education, support and counseling about physical activity/exercise needs.;Develop an individualized exercise prescription for aerobic and resistive training based on initial evaluation findings, risk stratification, comorbidities and participant's personal goals.       Expected Outcomes Achievement of increased cardiorespiratory fitness and enhanced flexibility, muscular endurance and strength shown through measurements of functional capacity and personal statement of participant.       Able to understand and use rate of perceived exertion (RPE) scale Yes       Intervention Provide education and explanation on how to use RPE scale  Expected Outcomes Short Term: Able to use RPE daily in rehab to express subjective intensity level;Long Term:  Able to use RPE to guide intensity level when exercising independently       Able to understand and use Dyspnea scale Yes       Intervention Provide education and explanation on how to use Dyspnea scale       Expected Outcomes Short Term: Able to use Dyspnea scale daily in rehab to express subjective sense of shortness of breath during exertion;Long Term: Able to use Dyspnea scale to guide intensity level when exercising independently       Knowledge and understanding of Target Heart Rate Range (THRR) Yes       Intervention Provide education and explanation of THRR including how the numbers were predicted and where they are located for reference       Expected Outcomes Short Term: Able to state/look up THRR;Long Term: Able to use THRR to govern intensity when exercising independently;Short Term: Able to use daily as guideline for intensity in rehab       Able to check pulse independently Yes       Intervention Provide education and demonstration on how to check pulse in carotid and radial arteries.;Review the importance of being able to check your own pulse for safety during independent exercise       Expected Outcomes Short Term:  Able to explain why pulse checking is important during independent exercise;Long Term: Able to check pulse independently and accurately       Understanding of Exercise Prescription Yes       Intervention Provide education, explanation, and written materials on patient's individual exercise prescription       Expected Outcomes Short Term: Able to explain program exercise prescription;Long Term: Able to explain home exercise prescription to exercise independently          Exercise Goals Re-Evaluation :     Exercise Goals Re-Evaluation    Row Name 09/22/17 1700 09/25/17 1348           Exercise Goal Re-Evaluation   Exercise Goals Review Increase Physical Activity;Increase Strength and Stamina;Able to understand and use rate of perceived exertion (RPE) scale Increase Physical Activity;Increase Strength and Stamina      Comments Reviewed RPE scale, THR and program prescription with pt today.  Pt voiced understanding and was given a copy of goals to take home.  Amariyana is off to a good start in rehab.  She has completed two full days of exercise.  She did not do well on the treadmil as it made her feel dizzy so we moved her to the track.  We will continue to monitor her progression.       Expected Outcomes Short: Use RPE daily to regulate intensity.  Long: Follow program prescription in THR. Short:  Continue to attend classes regularly.  Long: Follow program prescription for exercise to increase strength and stamina.          Discharge Exercise Prescription (Final Exercise Prescription Changes):     Exercise Prescription Changes - 09/25/17 1300      Response to Exercise   Blood Pressure (Admit) 122/60   Blood Pressure (Exercise) 122/56   Blood Pressure (Exit) 120/60   Heart Rate (Admit) 72 bpm   Heart Rate (Exercise) 92 bpm   Heart Rate (Exit) 70 bpm   Rating of Perceived Exertion (Exercise) 15   Symptoms none   Comments second full day of exercise   Duration Progress to  45 minutes of  aerobic exercise without signs/symptoms of physical distress   Intensity THRR unchanged     Progression   Progression Continue to progress workloads to maintain intensity without signs/symptoms of physical distress.   Average METs 2.4     Resistance Training   Training Prescription Yes   Weight 2 lb   Reps 10-15     Interval Training   Interval Training No     NuStep   Level 1   Minutes 15   METs 2.8     Biostep-RELP   Level 1   Minutes 15   METs 2     Track   Laps 7   Minutes 10      Nutrition:  Target Goals: Understanding of nutrition guidelines, daily intake of sodium <1525m, cholesterol <2059m calories 30% from fat and 7% or less from saturated fats, daily to have 5 or more servings of fruits and vegetables.  Biometrics:     Pre Biometrics - 09/17/17 1427      Pre Biometrics   Height '5\' 5"'  (1.651 m)   Weight 135 lb 8 oz (61.5 kg)   Waist Circumference 29 inches   Hip Circumference 38 inches   Waist to Hip Ratio 0.76 %   BMI (Calculated) 22.55       Nutrition Therapy Plan and Nutrition Goals:   Nutrition Discharge: Rate Your Plate Scores:     Nutrition Assessments - 09/22/17 1143      MEDFICTS Scores   Pre Score 50      Nutrition Goals Re-Evaluation:   Nutrition Goals Discharge (Final Nutrition Goals Re-Evaluation):   Psychosocial: Target Goals: Acknowledge presence or absence of significant depression and/or stress, maximize coping skills, provide positive support system. Participant is able to verbalize types and ability to use techniques and skills needed for reducing stress and depression.   Initial Review & Psychosocial Screening:     Initial Psych Review & Screening - 09/17/17 1405      Initial Review   Current issues with Current Stress Concerns   Source of Stress Concerns Chronic Illness;Transportation;Unable to participate in former interests or hobbies   Comments Her husband has dementia and since her event and surgery [facility] has taken a lot of stress off of her due to not being his sole caregiver. She still misses having him in the home but visits him almost daily. She lives alone and drives herself so that can be a stressor at times due to having intraoccular lens implants. She would like to attend church regularly again but feels she does not want to drive more than she has to and gets too fatigued once she is there. Once she gets some stamina back we discussed having her find a ride to church.     Family Dynamics   Good Support System? Yes   Comments Her husband has dementia and [place] and stay for longer visits to help at times.      Barriers   Psychosocial barriers to participate in program The patient should benefit from training in stress management and relaxation.     Screening Interventions   Interventions Yes;Encouraged to exercise;Program counselor consult;Provide feedback about the scores to participant   Expected Outcomes Short Term goal: Utilizing psychosocial counselor, staff and physician to assist with identification of specific Stressors or current issues interfering with healing process. Setting desired goal for  each stressor or current issue identified.;Long Term Goal: Stressors or current issues are controlled or eliminated.;Short Term goal: Identification and review with participant of any Quality of Life or Depression concerns found by scoring the questionnaire.;Long Term goal: The participant improves quality of Life and PHQ9 Scores as seen by post scores and/or verbalization of changes      Quality of Life Scores:      Quality of Life - 09/17/17 1412      Quality of Life Scores   Health/Function Pre 17.1 %   Socioeconomic Pre 21.92 %   Psych/Spiritual Pre 22.67 %   Family Pre 24.88 %   GLOBAL Pre 20.11 %      PHQ-9: Recent Review Flowsheet Data    Depression screen [facility]  2/9 09/17/2017   Decreased Interest 0   Down, Depressed, Hopeless 0   PHQ - 2 Score 0   Altered sleeping 0   Tired, decreased energy 1   Change in appetite 0   Feeling bad or failure about yourself  0   Trouble concentrating 1   Moving slowly or fidgety/restless 1   Suicidal thoughts 0   PHQ-9 Score 3   Difficult doing work/chores Not difficult at all     Interpretation of Total Score  Total Score Depression Severity:  1-4 = Minimal depression, 5-9 = Mild depression, 10-14 = Moderate depression, 15-19 = Moderately severe depression, 20-27 = Severe depression   Psychosocial Evaluation and Intervention:     Psychosocial Evaluation - 09/22/17 0933      Psychosocial Evaluation & Interventions   Interventions Encouraged to exercise with the program and follow exercise prescription   Comments Counselor met with Ms. Minette Brine [facility]) today for initial psychosocial evaluation.  She is an 82 year old who had a CABGx4 earlier this year.  She has a strong support system with a daughter; a sister-in-law; neighbors; and active involvement in her local church community.  Barney is married but her spouse has severe dementia and lives in a care center.  Evelise has diabetes in addition to her heart issues.  She sleeps well and has a good appetite.  Lucilia denies a history of depression or anxiety or any current symptoms and reports typically being in a positive mood.  She does have some stress in her life with her own health and that of her spouse.  Salvador has goals to walk better and increase her stamina while in this program.  She will be followed by staff.    Expected Outcomes Thomasenia will benefit from consistent exercise to achieve her stated goals.  The educational and psychoeducational components of this program will be helpful in understanding and coping more positively with her heart condition.  Staff will follow.   Continue Psychosocial Services  Follow up required by staff      Psychosocial  Re-Evaluation:   Psychosocial Discharge (Final Psychosocial Re-Evaluation):   Vocational Rehabilitation: Provide vocational rehab assistance to qualifying candidates.   Vocational Rehab Evaluation & Intervention:     Vocational Rehab - 09/17/17 1423      Initial Vocational Rehab Evaluation & Intervention   Assessment shows need for Vocational Rehabilitation No      Education: Education Goals: Education classes will be provided on a variety of topics geared toward better understanding of heart health and risk factor modification. Participant will state understanding/return demonstration of topics presented as noted by education test scores.  Learning Barriers/Preferences:     Learning Barriers/Preferences - 09/17/17 1421  Learning Barriers/Preferences   Learning Barriers Sight;Exercise Concerns  Has bilateral intraoccular lens implants, has appointment with optometrist tomorrow.  Does not walk well so has concerns about exercise and being able to do it.    Learning Preferences None      Education Topics: General Nutrition Guidelines/Fats and Fiber: -Group instruction provided by verbal, written material, models and posters to present the general guidelines for heart healthy nutrition. Gives an explanation and review of dietary fats and fiber.   Controlling Sodium/Reading Food Labels: -Group verbal and written material supporting the discussion of sodium use in heart healthy nutrition. Review and explanation with models, verbal and written materials for utilization of the food label.   Exercise Physiology & Risk Factors: - Group verbal and written instruction with models to review the exercise physiology of the cardiovascular system and associated critical values. Details cardiovascular disease risk factors and the goals associated with each risk factor.   Aerobic Exercise & Resistance Training: - Gives group verbal and written discussion on the health impact of  inactivity. On the components of aerobic and resistive training programs and the benefits of this training and how to safely progress through these programs.   Flexibility, Balance, General Exercise Guidelines: - Provides group verbal and written instruction on the benefits of flexibility and balance training programs. Provides general exercise guidelines with specific guidelines to those with heart or lung disease. Demonstration and skill practice provided.   Stress Management: - Provides group verbal and written instruction about the health risks of elevated stress, cause of high stress, and healthy ways to reduce stress.   Depression: - Provides group verbal and written instruction on the correlation between heart/lung disease and depressed mood, treatment options, and the stigmas associated with seeking treatment.   Cardiac Rehab from 09/29/2017 in [facility] Cardiac and Pulmonary Rehab  Date  09/29/17  Educator  [facility]  Instruction Review Code  1- Verbalizes Understanding      Anatomy & Physiology of the Heart: - Group verbal and written instruction and models provide basic cardiac anatomy and physiology, with the coronary electrical and arterial systems. Review of: AMI, Angina, Valve disease, Heart Failure, Cardiac Arrhythmia, Pacemakers, and the ICD.   Cardiac Procedures: - Group verbal and written instruction to review commonly prescribed medications for heart disease. Reviews the medication, class of the drug, and side effects. Includes the steps to properly store meds and maintain the prescription regimen. (beta blockers and nitrates)   Cardiac Medications I: - Group verbal and written instruction to review commonly prescribed medications for heart disease. Reviews the medication, class of the drug, and side effects. Includes the steps to properly store meds and maintain the prescription regimen.   Cardiac Rehab from 09/29/2017 in [facility] Cardiac and Pulmonary Rehab  Date  09/22/17   Educator  MA  Instruction Review Code  1- Verbalizes Understanding      Cardiac Medications II: -Group verbal and written instruction to review commonly prescribed medications for heart disease. Reviews the medication, class of the drug, and side effects. (all other drug classes)    Go Sex-Intimacy & Heart Disease, Get SMART - Goal Setting: - Group verbal and written instruction through game format to discuss heart disease and the return to sexual intimacy. Provides group verbal and written material to discuss and apply goal setting through the application of the S.M.A.R.T. Method.   Other Matters of the Heart: - Provides group verbal, written materials and models to describe Heart Failure, Angina, Valve Disease, Peripheral Artery Disease,  and Diabetes in the realm of heart disease. Includes description of the disease process and treatment options available to the cardiac patient.   Exercise & Equipment Safety: - Individual verbal instruction and demonstration of equipment use and safety with use of the equipment.   Cardiac Rehab from 09/29/2017 in [facility] Cardiac and Pulmonary Rehab  Date  09/17/17  Educator  [place]  Instruction Review Code  1- Verbalizes Understanding      Infection Prevention: - Provides verbal and written material to individual with discussion of infection control including proper hand washing and proper equipment cleaning during exercise session.   Cardiac Rehab from 09/29/2017 in [facility] Cardiac and Pulmonary Rehab  Date  09/17/17  Educator  [place]  Instruction Review Code  1- Verbalizes Understanding      Falls Prevention: - Provides verbal and written material to individual with discussion of falls prevention and safety.   Cardiac Rehab from 09/29/2017 in [facility] Cardiac and Pulmonary Rehab  Date  09/17/17  Educator  [place]  Instruction Review Code  1- Verbalizes Understanding      Diabetes: - Individual verbal and written instruction to review signs/symptoms of  diabetes, desired ranges of glucose level fasting, after meals and with exercise. Acknowledge that pre and post exercise glucose checks will be done for 3 sessions at entry of program.   Cardiac Rehab from 09/29/2017 in [facility] Cardiac and Pulmonary Rehab  Date  09/17/17  Educator  [place]  Instruction Review Code  1- Verbalizes Understanding      Other: -Provides group and verbal instruction on various topics (see comments)    Knowledge Questionnaire Score:     Knowledge Questionnaire Score - 09/17/17 1423      Knowledge Questionnaire Score   Pre Score 23/28  Answers reviewed with patient who verbalized understanding.       Core Components/Risk Factors/Patient Goals at Admission:     Personal Goals and Risk Factors at Admission - 09/17/17 1358      Core Components/Risk Factors/Patient Goals on Admission    Weight Management Weight Maintenance;Yes   Intervention Weight Management: Provide education and appropriate resources to help participant work on and attain dietary goals.;Weight Management: Develop a combined nutrition and exercise program designed to reach desired caloric intake, while maintaining appropriate intake of nutrient and fiber, sodium and fats, and appropriate energy expenditure required for the weight goal.   Admit Weight 135 lb 8 oz (61.5 kg)   Expected Outcomes Understanding recommendations for meals to include 15-35% energy as protein, 25-35% energy from fat, 35-60% energy from carbohydrates, less than 273m of dietary cholesterol, 20-35 gm of total fiber daily;Understanding of distribution of calorie intake throughout the day with the consumption of 4-5 meals/snacks;Weight Maintenance: Understanding of the daily nutrition guidelines, which includes 25-35% calories from fat, 7% or less cal from saturated fats, less than 201mcholesterol, less than 1.5gm of sodium, & 5 or more servings of fruits and vegetables daily   Diabetes Yes   Intervention Provide education about  signs/symptoms and action to take for hypo/hyperglycemia.;Provide education about proper nutrition, including hydration, and aerobic/resistive exercise prescription along with prescribed medications to achieve blood glucose in normal ranges: Fasting glucose 65-99 mg/dL   Expected Outcomes Short Term: Participant verbalizes understanding of the signs/symptoms and immediate care of hyper/hypoglycemia, proper foot care and importance of medication, aerobic/resistive exercise and nutrition plan for blood glucose control.;Long Term: Attainment of HbA1C < 7%.   Heart Failure Yes   Intervention Provide a combined exercise and nutrition program  that is supplemented with education, support and counseling about heart failure. Directed toward relieving symptoms such as shortness of breath, decreased exercise tolerance, and extremity edema.   Expected Outcomes Improve functional capacity of life;Short term: Attendance in program 2-3 days a week with increased exercise capacity. Reported lower sodium intake. Reported increased fruit and vegetable intake. Reports medication compliance.;Short term: Daily weights obtained and reported for increase. Utilizing diuretic protocols set by physician.;Long term: Adoption of self-care skills and reduction of barriers for early signs and symptoms recognition and intervention leading to self-care maintenance.   Hypertension Yes   Intervention Provide education on lifestyle modifcations including regular physical activity/exercise, weight management, moderate sodium restriction and increased consumption of fresh fruit, vegetables, and low fat dairy, alcohol moderation, and smoking cessation.;Monitor prescription use compliance.   Expected Outcomes Short Term: Continued assessment and intervention until BP is < 140/11m HG in hypertensive participants. < 130/833mHG in hypertensive participants with diabetes, heart failure or chronic kidney disease.;Long Term: Maintenance of blood  pressure at goal levels.   Lipids Yes   Intervention Provide education and support for participant on nutrition & aerobic/resistive exercise along with prescribed medications to achieve LDL <7078mHDL >72m62m Expected Outcomes Short Term: Participant states understanding of desired cholesterol values and is compliant with medications prescribed. Participant is following exercise prescription and nutrition guidelines.;Long Term: Cholesterol controlled with medications as prescribed, with individualized exercise RX and with personalized nutrition plan. Value goals: LDL < 70mg53mL > 40 mg.   Personal Goal Other Yes   Personal Goal She would like to feel well enough to attend church regularly and not feet poorly or fatigued during.    Intervention Patient will come to cardiac rehab and work on building stamina and strength.    Expected Outcomes She will gain enough strength and stamina to attend church regularly.       Core Components/Risk Factors/Patient Goals Review:    Core Components/Risk Factors/Patient Goals at Discharge (Final Review):    ITP Comments:     ITP Comments    Row Name 09/17/17 1354 09/30/17 0657         ITP Comments Med review completed. Initial ITP created. Diagnosis information can be found in [facility] encouter dated 08/19/2017. 30 day Review. Continue with ITP unless directed changes per Medical Director Review.          Comments:

## 2017-10-06 ENCOUNTER — Encounter: Payer: Medicare Other | Admitting: *Deleted

## 2017-10-06 DIAGNOSIS — Z951 Presence of aortocoronary bypass graft: Secondary | ICD-10-CM

## 2017-10-06 DIAGNOSIS — I5022 Chronic systolic (congestive) heart failure: Secondary | ICD-10-CM

## 2017-10-06 NOTE — Progress Notes (Signed)
Daily Session Note  Patient Details  Name: [name] [name] MRN: [id number] Date of Birth: 09/13/1935 Referring Provider:     Cardiac Rehab from 09/17/2017 in [facility]  Referring Provider  Bensimhon      Encounter Date: 10/06/2017  Check In:     Session Check In - 10/06/17 0846      Check-In   Location [facility]   Staff Present Meredith Craven, RN BSN;Joseph Hood RCP,RRT,BSRT;Diane Wright RN,BSN   Supervising physician immediately available to respond to emergencies See telemetry face sheet for immediately available ER MD   Medication changes reported     No   Fall or balance concerns reported    No   Warm-up and Cool-down Performed on first and last piece of equipment   Resistance Training Performed Yes   VAD Patient? No     Pain Assessment   Currently in Pain? No/denies         History  Smoking Status  . Never Smoker  Smokeless Tobacco  . Never Used    Goals Met:  Independence with exercise equipment Exercise tolerated well No report of cardiac concerns or symptoms Strength training completed today  Goals Unmet:  Not Applicable  Comments: Pt able to follow exercise prescription today without complaint.  Will continue to monitor for progression.    Dr. Mark Miller is Medical Director for [facility] and [facility]. 

## 2017-10-07 ENCOUNTER — Ambulatory Visit (INDEPENDENT_AMBULATORY_CARE_PROVIDER_SITE_OTHER): Payer: Medicare Other

## 2017-10-07 DIAGNOSIS — I340 Nonrheumatic mitral (valve) insufficiency: Secondary | ICD-10-CM | POA: Diagnosis not present

## 2017-10-07 DIAGNOSIS — Z5181 Encounter for therapeutic drug level monitoring: Secondary | ICD-10-CM | POA: Diagnosis not present

## 2017-10-07 LAB — POCT INR: INR: 3.2

## 2017-10-08 ENCOUNTER — Encounter: Payer: Medicare Other | Admitting: *Deleted

## 2017-10-08 DIAGNOSIS — I5022 Chronic systolic (congestive) heart failure: Secondary | ICD-10-CM | POA: Diagnosis not present

## 2017-10-08 DIAGNOSIS — Z951 Presence of aortocoronary bypass graft: Secondary | ICD-10-CM

## 2017-10-08 NOTE — Progress Notes (Signed)
Daily Session Note  Patient Details  Name: [name] [name] MRN: [id number] Date of Birth: 1935/11/24 Referring Provider:     Cardiac Rehab from 09/17/2017 in [facility]  Referring Provider  Bensimhon      Encounter Date: 10/08/2017  Check In:     Session Check In - 10/08/17 0941      Check-In   Location [facility]   Staff Present Nada Maclachlan, BA, ACSM CEP, Exercise Physiologist;Krista Frederico Hamman, RN BSN;Meredith Sherryll Burger, RN BSN   Supervising physician immediately available to respond to emergencies See telemetry face sheet for immediately available ER MD   Medication changes reported     No   Fall or balance concerns reported    No   Tobacco Cessation No Change   Warm-up and Cool-down Performed on first and last piece of equipment   Resistance Training Performed Yes   VAD Patient? No     Pain Assessment   Currently in Pain? No/denies   Multiple Pain Sites No           Exercise Prescription Changes - 10/07/17 1200      Response to Exercise   Blood Pressure (Admit) 130/64   Blood Pressure (Exercise) 122/64   Blood Pressure (Exit) 124/70   Heart Rate (Admit) 56 bpm   Heart Rate (Exercise) 94 bpm   Heart Rate (Exit) 54 bpm   Rating of Perceived Exertion (Exercise) 13   Symptoms none   Duration Progress to 45 minutes of aerobic exercise without signs/symptoms of physical distress   Intensity THRR unchanged     Progression   Progression Continue to progress workloads to maintain intensity without signs/symptoms of physical distress.   Average METs 2.2     Resistance Training   Training Prescription Yes   Weight 2 lb   Reps 10-15     Interval Training   Interval Training No     Treadmill   MPH 1.5   Grade 0   Minutes 15   METs 2.15     NuStep   Level 1   SPM 80   Minutes 15   METs 2.2      History  Smoking Status  . Never Smoker  Smokeless Tobacco  . Never Used    Goals Met:  Independence with  exercise equipment Exercise tolerated well No report of cardiac concerns or symptoms Strength training completed today  Goals Unmet:  Not Applicable  Comments: Pt able to follow exercise prescription today without complaint.  Will continue to monitor for progression.    Dr. Emily Filbert is Medical Director for [place] and [facility].

## 2017-10-12 ENCOUNTER — Telehealth: Payer: Self-pay | Admitting: Licensed Clinical Social Worker

## 2017-10-12 NOTE — Telephone Encounter (Signed)
CSW requested to assist patient with transportation to appointment at [facility] on Wednesday October 24 @ 3:30pm. CSW spoke with patient and will assist with taxi as any other mode of transportation will be challenging due to cross county and advanced notice. CSW will follow up with patient to discuss alternatives for transportation going forward. Raquel Sarna, [place], [place]

## 2017-10-13 DIAGNOSIS — I5022 Chronic systolic (congestive) heart failure: Secondary | ICD-10-CM | POA: Diagnosis not present

## 2017-10-13 DIAGNOSIS — Z951 Presence of aortocoronary bypass graft: Secondary | ICD-10-CM

## 2017-10-13 NOTE — Progress Notes (Signed)
Daily Session Note  Patient Details  Name: [name] [name] MRN: [id number] Date of Birth: [birth year] Referring Provider:     Cardiac Rehab from 09/17/2017 in [facility]  Referring Provider  Bensimhon      Encounter Date: 10/13/2017  Check In:     Session Check In - 10/13/17 0844      Check-In   Location [facility]   Staff Present Heath Lark, RN, BSN, CCRP;Jessica Siloam, MA, ACSM RCEP, Exercise Physiologist;Amanda Oletta Darter, [place], ACSM CEP, Exercise Physiologist   Supervising physician immediately available to respond to emergencies See telemetry face sheet for immediately available ER MD   Medication changes reported     No   Fall or balance concerns reported    No   Warm-up and Cool-down Performed on first and last piece of equipment   Resistance Training Performed Yes   VAD Patient? No     Pain Assessment   Currently in Pain? No/denies   Multiple Pain Sites No         History  Smoking Status  . Never Smoker  Smokeless Tobacco  . Never Used    Goals Met:  Independence with exercise equipment Exercise tolerated well No report of cardiac concerns or symptoms Strength training completed today  Goals Unmet:  Not Applicable  Comments: Pt able to follow exercise prescription today without complaint.  Will continue to monitor for progression.    Dr. Emily Filbert is Medical Director for [place] and [facility].

## 2017-10-14 ENCOUNTER — Ambulatory Visit (INDEPENDENT_AMBULATORY_CARE_PROVIDER_SITE_OTHER): Payer: Medicare Other

## 2017-10-14 DIAGNOSIS — I340 Nonrheumatic mitral (valve) insufficiency: Secondary | ICD-10-CM

## 2017-10-14 DIAGNOSIS — Z5181 Encounter for therapeutic drug level monitoring: Secondary | ICD-10-CM | POA: Diagnosis not present

## 2017-10-14 LAB — POCT INR: INR: 2.9

## 2017-10-20 ENCOUNTER — Telehealth: Payer: Self-pay | Admitting: *Deleted

## 2017-10-20 ENCOUNTER — Encounter: Payer: Self-pay | Admitting: *Deleted

## 2017-10-20 DIAGNOSIS — I5022 Chronic systolic (congestive) heart failure: Secondary | ICD-10-CM

## 2017-10-20 DIAGNOSIS — Z951 Presence of aortocoronary bypass graft: Secondary | ICD-10-CM

## 2017-10-20 NOTE — Telephone Encounter (Signed)
Return call to pt from this morning. She has been out since last Tuesday with swelling in her ankles. She has an appointment tomorrow with Dr. Haroldine Laws and I encouraged her to show him her ankles. I also encouraged her to return to rehab on Thursday.

## 2017-10-21 ENCOUNTER — Encounter (HOSPITAL_COMMUNITY): Payer: Self-pay | Admitting: Internal Medicine

## 2017-10-21 ENCOUNTER — Ambulatory Visit (HOSPITAL_COMMUNITY)
Admission: RE | Admit: 2017-10-21 | Discharge: 2017-10-21 | Disposition: A | Discharge status: Home / Self Care | Payer: Medicare Other | Source: Ambulatory Visit | Attending: Internal Medicine | Admitting: Internal Medicine

## 2017-10-21 VITALS — BP 116/62 | HR 60 | Wt 154.0 lb

## 2017-10-21 DIAGNOSIS — I2510 Atherosclerotic heart disease of native coronary artery without angina pectoris: Secondary | ICD-10-CM

## 2017-10-21 DIAGNOSIS — I130 Hypertensive heart and chronic kidney disease with heart failure and stage 1 through stage 4 chronic kidney disease, or unspecified chronic kidney disease: Secondary | ICD-10-CM | POA: Insufficient documentation

## 2017-10-21 DIAGNOSIS — Z7901 Long term (current) use of anticoagulants: Secondary | ICD-10-CM | POA: Insufficient documentation

## 2017-10-21 DIAGNOSIS — H409 Unspecified glaucoma: Secondary | ICD-10-CM | POA: Diagnosis not present

## 2017-10-21 DIAGNOSIS — Z79899 Other long term (current) drug therapy: Secondary | ICD-10-CM | POA: Insufficient documentation

## 2017-10-21 DIAGNOSIS — Z9889 Other specified postprocedural states: Secondary | ICD-10-CM | POA: Insufficient documentation

## 2017-10-21 DIAGNOSIS — I5022 Chronic systolic (congestive) heart failure: Secondary | ICD-10-CM | POA: Diagnosis present

## 2017-10-21 DIAGNOSIS — E785 Hyperlipidemia, unspecified: Secondary | ICD-10-CM | POA: Diagnosis not present

## 2017-10-21 DIAGNOSIS — Z953 Presence of xenogenic heart valve: Secondary | ICD-10-CM | POA: Insufficient documentation

## 2017-10-21 DIAGNOSIS — I480 Paroxysmal atrial fibrillation: Secondary | ICD-10-CM | POA: Diagnosis not present

## 2017-10-21 DIAGNOSIS — Z951 Presence of aortocoronary bypass graft: Secondary | ICD-10-CM | POA: Insufficient documentation

## 2017-10-21 DIAGNOSIS — I214 Non-ST elevation (NSTEMI) myocardial infarction: Secondary | ICD-10-CM | POA: Insufficient documentation

## 2017-10-21 DIAGNOSIS — Z833 Family history of diabetes mellitus: Secondary | ICD-10-CM | POA: Diagnosis not present

## 2017-10-21 DIAGNOSIS — N184 Chronic kidney disease, stage 4 (severe): Secondary | ICD-10-CM | POA: Insufficient documentation

## 2017-10-21 DIAGNOSIS — Z86010 Personal history of colonic polyps: Secondary | ICD-10-CM | POA: Diagnosis not present

## 2017-10-21 DIAGNOSIS — I252 Old myocardial infarction: Secondary | ICD-10-CM | POA: Diagnosis not present

## 2017-10-21 DIAGNOSIS — E1122 Type 2 diabetes mellitus with diabetic chronic kidney disease: Secondary | ICD-10-CM | POA: Insufficient documentation

## 2017-10-21 LAB — BASIC METABOLIC PANEL
Anion gap: 8 (ref 5–15)
BUN: 30 mg/dL — AB (ref 6–20)
CALCIUM: 9.7 mg/dL (ref 8.9–10.3)
CO2: 22 mmol/L (ref 22–32)
CREATININE: 2.51 mg/dL — AB (ref 0.44–1.00)
Chloride: 111 mmol/L (ref 101–111)
GFR calc Af Amer: 20 mL/min — ABNORMAL LOW (ref >60)
GFR, EST NON AFRICAN AMERICAN: 17 mL/min — AB (ref >60)
GLUCOSE: 124 mg/dL — AB (ref 65–99)
Potassium: 3.5 mmol/L (ref 3.5–5.1)
SODIUM: 141 mmol/L (ref 135–145)

## 2017-10-21 MED ORDER — ASPIRIN EC 81 MG PO TBEC: 81.0000 mg | DELAYED_RELEASE_TABLET | Freq: Every day | ORAL | 3 refills | Status: AC

## 2017-10-21 MED ORDER — POTASSIUM CHLORIDE ER 20 MEQ PO TBCR: 20.0000 meq | EXTENDED_RELEASE_TABLET | Freq: Every day | ORAL | 3 refills | Status: DC

## 2017-10-21 MED ORDER — FUROSEMIDE 20 MG PO TABS: 40.0000 mg | ORAL_TABLET | Freq: Every day | ORAL | 3 refills | Status: DC

## 2017-10-21 NOTE — Addendum Note (Signed)
Encounter addended by: Louann Liv, LCSW on: 10/21/2017  4:12 PM<BR>    Actions taken: Sign clinical note

## 2017-10-21 NOTE — Progress Notes (Signed)
CSW met with patient and daughter to discuss transportation options. Patient states she lives in [place] and often has no difficulties getting to [place] and back home. Unfortunately, she is challenged to get to and from [facility] for appointments when her daughter is not in town. Due to limited resources for transportation that cross county lines there are not many options for this situation. Patient states it is infrequent that she needs assistance and will call CSW if needed. CSW will assist with taxi options when necessary to come to the HF clinic/[place] when daughter not available. CSW available as needed. Raquel Sarna, [place], [place]

## 2017-10-21 NOTE — Progress Notes (Signed)
Advanced Heart Failure Clinic Note   PCP: Dr. Ouida Sills Primary Cardiologist: Freddye Cardamone  HPI:  Ms. Massar is a 82 year old female with a history of type 2 diabetes mellitus, hypertension, hyperlipidemia and CKD.   Admiited on 02/03/2017 with NSTEMI. Emergent cardiac catheterization which showed three-vessel coronary artery disease. She developed profound shock with multi-system organ failure and found to have severe MR due to partial flail of MV. Underwent emergent CABG x 4 (Coronary artery bypass grafting x 4   Left internal mammary graft to the LAD  Sequential SVG to diagonal and OM  SVG to PDA and bioprosthetic MVR   Had prolonged post-op course with renal failure, PAF (resolved with amio), paralyzed left vocal cord and HF. Post-op Echo with EF 40% RV normal, and stable MVR. ECHO 3/4 EF 20-25% RV mild-moderately reduced. Trivial pericardial effusion.   On d/c was 152 pounds.  Cr 2.3  She presents today for HF follow up. In early July was admitted to [facility] with weakness and hypercalcemia. Creatinine up slightly and felt to be dry. Lasix stopped completely.  We restarted lasix 40 mg M/W/F in 8/18 (but was only taking 20). Saw Dr. Moshe Cipro (Nephrology) last week and now she is on 20mg  every day. Still with significant LE edema. Worse at night or if feet hang down. Going to CR at [facility]. Gets SOB if she walks too much.  Walks with cane. + orthopnea. No PND. No CP. Sopped spiro by mistake . Weight up 18 pounds  Echo 8/18. EF ~25- 30% MVR stable    Review of systems complete and found to be negative unless listed in HPI.    SH:  Social History   Social History  . Marital status: Married    Spouse name: N/A  . Number of children: N/A  . Years of education: N/A   Occupational History  . Not on file.   Social History Main Topics  . Smoking status: Never Smoker  . Smokeless tobacco: Never Used  . Alcohol use No  . Drug use: No  . Sexual activity: Not on file   Other  Topics Concern  . Not on file   Social History Narrative  . No narrative on file    FH:  Family History  Problem Relation Age of Onset  . Diabetes Father   . Diabetes Sister     Past Medical History:  Diagnosis Date  . [name]    a. LHC 02/03/17 for unstable angina showed severe 3v CAD w/ sequential 70% & 80% p-mLAD, 99% mLCx, CTO mRCA, elevated LV filling pressure  . Colon polyp 2010  . Diabetes mellitus with complication ([place])   . Glaucoma   . Hypertension     Current Outpatient Prescriptions  Medication Sig Dispense Refill  . acetaminophen (TYLENOL) 500 MG tablet Take 500 mg by mouth every 6 (six) hours as needed for mild pain.     . carvedilol (COREG) 3.125 MG tablet TAKE 1 TABLET BY MOUTH TWICE DAILY 180 tablet 3  . furosemide (LASIX) 20 MG tablet Take 1 tablet (20 mg total) by mouth 3 (three) times a week. Take Monday Wednesday Friday (Patient taking differently: Take 20 mg by mouth daily. Take Monday Wednesday Friday) 40 tablet 3  . hydrALAZINE (APRESOLINE) 50 MG tablet Take 0.5 tablets (25 mg total) by mouth 2 (two) times daily. HOLD IF systolic BP <275 60 tablet 0  . isosorbide mononitrate (IMDUR) 30 MG 24 hr tablet TAKE 1 TABLET BY MOUTH ONCE  DAILY 90 tablet 3  . latanoprost (XALATAN) 0.005 % ophthalmic solution Place 1 drop into both eyes at bedtime.     . rosuvastatin (CRESTOR) 40 MG tablet Take 1 tablet (40 mg total) by mouth daily. 90 tablet 3  . timolol (BETIMOL) 0.5 % ophthalmic solution Place 1 drop into both eyes 2 (two) times daily.    Marland Kitchen warfarin (COUMADIN) 1 MG tablet Take 2 tablets daily or As Directed 60 tablet 1  . potassium chloride 20 MEQ TBCR Take 40 mEq by mouth as needed. Take only as directed (Patient not taking: Reported on 10/21/2017) 10 tablet 0  . spironolactone (ALDACTONE) 25 MG tablet Take 1 tablet (25 mg total) by mouth daily. (Patient not taking: Reported on 10/21/2017) 14 tablet 0   No current facility-administered medications  for this encounter.     Vitals:   10/21/17 1031  BP: 116/62  Pulse: 60  SpO2: 95%  Weight: 154 lb (69.9 kg)    Wt Readings from Last 3 Encounters:  10/21/17 154 lb (69.9 kg)  09/17/17 135 lb 8 oz (61.5 kg)  08/19/17 136 lb 8 oz (61.9 kg)    PHYSICAL EXAM:  General:  Well appearing. Walks with cane. No resp difficulty HEENT: normal Neck: supple. JVP 10 . Carotids 2+ bilat; no bruits. No lymphadenopathy or thryomegaly appreciated. Cor: PMI nondisplaced. Regular rate & rhythm. No rubs, gallops or murmurs. Lungs: clear Abdomen: soft, nontender, nondistended. No hepatosplenomegaly. No bruits or masses. Good bowel sounds. Extremities: no cyanosis, clubbing, rash, 2+ edema Neuro: alert & orientedx3, cranial nerves grossly intact. moves all 4 extremities w/o difficulty. Affect pleasant  ASSESSMENT & PLAN:  1. NSTEMI with CAD [name] on 02/05/17 - No s/s of ischemia  - Stop coumadin. Resume ECASA 81  - Continue  b-blocker and statin - Continue cardiac rehab at [facility]  2. Severe MR with flail segment s/p MVR c/ bioprosthetic valve. - Valve stable on echo  - Knows she will need SBE prophylaxis with any dental procedures.   3. Chronic systolic HF :  - Echo 0/07 EF 40% MVR ok. Echo 3/5 EF 20-25% Echo today reviewed personally EF 30% - Overall stable. NYHA II-III - Volume status significantly elevated. Increase lasix 40 daily. No spiro with severe CKD.  - Continue hydralazine to 50 mg TID.   - Continue imdur 30 mg daily.  - Continue carvedilol 3.125 bid - Renal function prohibits Entresto initiation  - EF 25-30%. We disussed ICD but have decided to defer given age, improving EF and comorbidities - particulary CKD 4 with possible need for HD  - Reinforced fluid restriction to < 2 L daily, sodium restriction to less than 2000 mg daily, and the importance of daily weights.   4. CKD stage 4 - BMET today. Continue to follow with Nephrology. Discussed possible need for HD at some point.   5. PAF, post-op - NSR on exam. Off amio. Can stop warfarin Resume ASA 81  - LFT's ok in March.  6. DM2 - Per PCP   Glori Bickers, MD  10:45 AM

## 2017-10-21 NOTE — Patient Instructions (Addendum)
Stop Coumadin  Start Asprin 81 mg daily  Increase Furosemide to 40 mg daily  Increase Potassium to 20 meq daily  Labs today  You have been referred to [facility] in [place]:  Friday 10/30/17 at 10:20 AM [address] Professional [address] [place], [place] [address] [phone]  Your physician recommends that you schedule a follow-up appointment in: 2 weeks

## 2017-10-22 ENCOUNTER — Encounter: Payer: Medicare Other | Attending: Internal Medicine

## 2017-10-22 DIAGNOSIS — I5022 Chronic systolic (congestive) heart failure: Secondary | ICD-10-CM | POA: Diagnosis present

## 2017-10-22 DIAGNOSIS — Z951 Presence of aortocoronary bypass graft: Secondary | ICD-10-CM | POA: Diagnosis present

## 2017-10-22 LAB — GLUCOSE, CAPILLARY: Glucose-Capillary: 142 mg/dL — ABNORMAL HIGH (ref 65–99)

## 2017-10-22 NOTE — Progress Notes (Signed)
Daily Session Note  Patient Details  Name: [name] [name] MRN: [id number] Date of Birth: 1935-04-12 Referring Provider:     Cardiac Rehab from 09/17/2017 in [facility] Re[facility]  Referring Provider  Bensimhon      Encounter Date: 10/22/2017  Check In:     Session Check In - 10/22/17 0940      Check-In   Location [facility]   Staff Present Alberteen Sam, MA, ACSM RCEP, Exercise Physiologist;Amanda Oletta Darter, BA, ACSM CEP, Exercise Physiologist;Meredith Sherryll Burger, RN BSN   Supervising physician immediately available to respond to emergencies See telemetry face sheet for immediately available ER MD   Medication changes reported     No   Fall or balance concerns reported    No   Warm-up and Cool-down Performed on first and last piece of equipment   Resistance Training Performed Yes   VAD Patient? No     Pain Assessment   Currently in Pain? No/denies   Multiple Pain Sites No         History  Smoking Status  . Never Smoker  Smokeless Tobacco  . Never Used    Goals Met:  Independence with exercise equipment Exercise tolerated well Personal goals reviewed No report of cardiac concerns or symptoms Strength training completed today  Goals Unmet:  Not Applicable  Comments: Pt able to follow exercise prescription today without complaint.  Will continue to monitor for progression.    Dr. Emily Filbert is Medical Director for [place] and [facility].

## 2017-10-27 ENCOUNTER — Encounter: Payer: Medicare Other | Admitting: *Deleted

## 2017-10-27 DIAGNOSIS — I5022 Chronic systolic (congestive) heart failure: Secondary | ICD-10-CM | POA: Diagnosis not present

## 2017-10-27 DIAGNOSIS — Z951 Presence of aortocoronary bypass graft: Secondary | ICD-10-CM

## 2017-10-27 NOTE — Progress Notes (Signed)
Daily Session Note  Patient Details  Name: [name] [name] MRN: [id number] Date of Birth: November 25, 1935 Referring Provider:     Cardiac Rehab from 09/17/2017 in [facility] Cardiac and Pulmonary Rehab  Referring Provider  Bensimhon      Encounter Date: 10/27/2017  Check In: Session Check In - 10/27/17 0832      Check-In   Location  [facility]    Staff Present  Nada Maclachlan, BA, ACSM CEP, Exercise Physiologist;Susanne Bice, RN, BSN, CCRP;Yaqueline Gutter Luan Pulling, MA, ACSM RCEP, Exercise Physiologist    Supervising physician immediately available to respond to emergencies  See telemetry face sheet for immediately available ER MD    Medication changes reported      No    Fall or balance concerns reported     No    Warm-up and Cool-down  Performed on first and last piece of equipment    Resistance Training Performed  Yes    VAD Patient?  No      Pain Assessment   Currently in Pain?  No/denies          Social History   Tobacco Use  Smoking Status Never Smoker  Smokeless Tobacco Never Used    Goals Met:  Independence with exercise equipment Exercise tolerated well No report of cardiac concerns or symptoms Strength training completed today  Goals Unmet:  Not Applicable  Comments: Pt able to follow exercise prescription today without complaint.  Will continue to monitor for progression.  Reviewed home exercise with pt today.  Pt plans to walk at home for exercise.  Reviewed THR, pulse, RPE, sign and symptoms, and when to call 911 or MD.  Also discussed weather considerations and indoor options.  Pt voiced understanding.   Dr. Emily Filbert is Medical Director for [place] and [facility].

## 2017-10-28 ENCOUNTER — Encounter: Payer: Self-pay | Admitting: *Deleted

## 2017-10-28 DIAGNOSIS — I5022 Chronic systolic (congestive) heart failure: Secondary | ICD-10-CM

## 2017-10-28 DIAGNOSIS — Z951 Presence of aortocoronary bypass graft: Secondary | ICD-10-CM

## 2017-10-28 NOTE — Progress Notes (Signed)
Cardiac Individual Treatment Plan  Patient Details  Name: [name] [name] MRN: [id number] Date of Birth: [birth year] Referring Provider:     Cardiac Rehab from 09/17/2017 in [facility] Cardiac and Pulmonary Rehab  Referring Provider  Bensimhon      Initial Encounter Date:    Cardiac Rehab from 09/17/2017 in [facility] Cardiac and Pulmonary Rehab  Date  09/17/17  Referring Provider  Bensimhon      Visit Diagnosis: [name]  Heart failure, chronic systolic ([place])  Patient's Home Medications on Admission:  Current Outpatient Medications:  .  acetaminophen (TYLENOL) 500 MG tablet, Take 500 mg by mouth every 6 (six) hours as needed for mild pain. , Disp: , Rfl:  .  aspirin EC 81 MG tablet, Take 1 tablet (81 mg total) by mouth daily., Disp: 90 tablet, Rfl: 3 .  carvedilol (COREG) 3.125 MG tablet, TAKE 1 TABLET BY MOUTH TWICE DAILY, Disp: 180 tablet, Rfl: 3 .  furosemide (LASIX) 20 MG tablet, Take 2 tablets (40 mg total) by mouth daily., Disp: 60 tablet, Rfl: 3 .  hydrALAZINE (APRESOLINE) 50 MG tablet, Take 0.5 tablets (25 mg total) by mouth 2 (two) times daily. HOLD IF systolic BP <453, Disp: 60 tablet, Rfl: 0 .  isosorbide mononitrate (IMDUR) 30 MG 24 hr tablet, TAKE 1 TABLET BY MOUTH ONCE DAILY, Disp: 90 tablet, Rfl: 3 .  latanoprost (XALATAN) 0.005 % ophthalmic solution, Place 1 drop into both eyes at bedtime. , Disp: , Rfl:  .  Potassium Chloride ER 20 MEQ TBCR, Take 20 mEq by mouth daily., Disp: 30 tablet, Rfl: 3 .  rosuvastatin (CRESTOR) 40 MG tablet, Take 1 tablet (40 mg total) by mouth daily., Disp: 90 tablet, Rfl: 3 .  timolol (BETIMOL) 0.5 % ophthalmic solution, Place 1 drop into both eyes 2 (two) times daily., Disp: , Rfl:   Past Medical History: Past Medical History:  Diagnosis Date  . [name]    a. LHC 02/03/17 for unstable angina showed severe 3v CAD w/ sequential 70% & 80% p-mLAD, 99% mLCx, CTO mRCA, elevated LV filling pressure  . Colon polyp 2010  . Diabetes  mellitus with complication ([place])   . Glaucoma   . Hypertension     Tobacco Use: Social History   Tobacco Use  Smoking Status Never Smoker  Smokeless Tobacco Never Used    Labs: Recent Review Flowsheet Data    Labs for ITP Cardiac and Pulmonary Rehab Latest Ref Rng & Units 03/01/2017 03/02/2017 03/03/2017 03/04/2017 03/05/2017   Cholestrol 0 - 200 mg/dL - - - - -   LDLCALC 0 - 99 mg/dL - - - - -   HDL >40 mg/dL - - - - -   Trlycerides <150 mg/dL - - - - -   Hemoglobin A1c 4.8 - 5.6 % - - - - -   PHART 7.350 - 7.450 - - - - -   PCO2ART 32.0 - 48.0 mmHg - - - - -   HCO3 20.0 - 28.0 mmol/L - - - - -   TCO2 0 - 100 mmol/L - - - - -   ACIDBASEDEF 0.0 - 2.0 mmol/L - - - - -   O2SAT % 69.5 68.3 58.5 61.0 74.5       Exercise Target Goals:    Exercise Program Goal: Individual exercise prescription set with THRR, safety & activity barriers. Participant demonstrates ability to understand and report RPE using BORG scale, to self-measure pulse accurately, and to acknowledge  the importance of the exercise prescription.  Exercise Prescription Goal: Starting with aerobic activity 30 plus minutes a day, 3 days per week for initial exercise prescription. Provide home exercise prescription and guidelines that participant acknowledges understanding prior to discharge.  Activity Barriers & Risk Stratification: Activity Barriers & Cardiac Risk Stratification - 09/17/17 1426      Activity Barriers & Cardiac Risk Stratification   Activity Barriers  Back Problems;Balance Concerns;Assistive Device;Other (comment)    Comments  She has back pain and knee pain but is not being treated for it. It causes her to have balance issues but she uses a cane to help.     Cardiac Risk Stratification  High       6 Minute Walk: 6 Minute Walk    Row Name 09/17/17 1429         6 Minute Walk   Phase  Initial     Distance  725 feet     Walk Time  6 minutes     # of Rest Breaks  0     MPH  1.37     METS   1.76     RPE  11     VO2 Peak  6.18     Symptoms  No     Resting HR  82 bpm     Resting BP  136/58     Exercise Oxygen Saturation  during 6 min walk  100 %     Max Ex. HR  116 bpm     Max Ex. BP  134/60     2 Minute Post BP  134/72        Oxygen Initial Assessment: Oxygen Initial Assessment - 10/01/17 1509      Home Oxygen   Home Oxygen Device  None    Sleep Oxygen Prescription  None    Home Exercise Oxygen Prescription  None    Home at Rest Exercise Oxygen Prescription  None      Initial 6 min Walk   Oxygen Used  None      Program Oxygen Prescription   Program Oxygen Prescription  None       Oxygen Re-Evaluation:   Oxygen Discharge (Final Oxygen Re-Evaluation):   Initial Exercise Prescription: Initial Exercise Prescription - 09/17/17 1400      Date of Initial Exercise RX and Referring Provider   Date  09/17/17    Referring Provider  Bensimhon      Treadmill   MPH  1    Grade  0    Minutes  15    METs  1.77      NuStep   Level  1    SPM  80    Minutes  15    METs  1.7      Biostep-RELP   Level  1    SPM  50    Minutes  15    METs  1.7      Track   Laps  15    Minutes  15    METs  1.7      Prescription Details   Frequency (times per week)  3    Duration  Progress to 45 minutes of aerobic exercise without signs/symptoms of physical distress      Intensity   THRR 40-80% of Max Heartrate  105-128    Ratings of Perceived Exertion  11-13    Perceived Dyspnea  0-4      Resistance Training   Training  Prescription  Yes    Weight  2 lb    Reps  10-15       Perform Capillary Blood Glucose checks as needed.  Exercise Prescription Changes: Exercise Prescription Changes    Row Name 09/17/17 1400 09/25/17 1300 10/07/17 1200 10/23/17 0900 10/27/17 0900     Response to Exercise   Blood Pressure (Admit)  136/58  122/60  130/64  132/60  -   Blood Pressure (Exercise)  134/60  122/56  122/64  142/70  -   Blood Pressure (Exit)  134/72  120/60   124/70  124/78  -   Heart Rate (Admit)  81 bpm  72 bpm  56 bpm  67 bpm  -   Heart Rate (Exercise)  116 bpm  92 bpm  94 bpm  94 bpm  -   Heart Rate (Exit)  80 bpm  70 bpm  54 bpm  66 bpm  -   Oxygen Saturation (Exit)  100 %  -  -  -  -   Rating of Perceived Exertion (Exercise)  '11  15  13  12  ' -   Symptoms  -  none  none  none  -   Comments  -  second full day of exercise  -  -  -   Duration  -  Progress to 45 minutes of aerobic exercise without signs/symptoms of physical distress  Progress to 45 minutes of aerobic exercise without signs/symptoms of physical distress  Progress to 45 minutes of aerobic exercise without signs/symptoms of physical distress  -   Intensity  -  THRR unchanged  THRR unchanged  THRR unchanged  -     Progression   Progression  -  Continue to progress workloads to maintain intensity without signs/symptoms of physical distress.  Continue to progress workloads to maintain intensity without signs/symptoms of physical distress.  Continue to progress workloads to maintain intensity without signs/symptoms of physical distress.  -   Average METs  -  2.4  2.2  1.98  -     Resistance Training   Training Prescription  -  Yes  Yes  Yes  -   Weight  -  2 lb  2 lb  2 lb  -   Reps  -  10-15  10-15  10-15  -     Interval Training   Interval Training  -  No  No  No  -     Treadmill   MPH  -  -  1.5  1.5  -   Grade  -  -  0  0  -   Minutes  -  -  15  15  -   METs  -  -  2.15  2.15  -     NuStep   Level  -  '1  1  1  ' -   SPM  -  -  80  -  -   Minutes  -  '15  15  15  ' -   METs  -  2.8  2.2  1.8  -     Biostep-RELP   Level  -  1  -  1  -   Minutes  -  15  -  15  -   METs  -  2  -  2  -     Track   Laps  -  7  -  -  -   Minutes  -  10  -  -  -     Home Exercise Plan   Plans to continue exercise at  -  -  -  -  Home (comment) walking   Frequency  -  -  -  -  Add 2 additional days to program exercise sessions.   Initial Home Exercises Provided  -  -  -  -  10/27/17       Exercise Comments: Exercise Comments    Row Name 09/22/17 [phone] 09/29/17 0856         Exercise Comments  First full day of exercise!  Patient was oriented to gym and equipment including functions, settings, policies, and procedures.  Patient's individual exercise prescription and treatment plan were reviewed.  All starting workloads were established based on the results of the 6 minute walk test done at initial orientation visit.  The plan for exercise progression was also introduced and progression will be customized based on patient's performance and goals.  Mendy increased to 1.5 mph on TM today,         Exercise Goals and Review: Exercise Goals    Row Name 09/17/17 1427             Exercise Goals   Increase Physical Activity  Yes       Intervention  Provide advice, education, support and counseling about physical activity/exercise needs.;Develop an individualized exercise prescription for aerobic and resistive training based on initial evaluation findings, risk stratification, comorbidities and participant's personal goals.       Expected Outcomes  Achievement of increased cardiorespiratory fitness and enhanced flexibility, muscular endurance and strength shown through measurements of functional capacity and personal statement of participant.       Increase Strength and Stamina  Yes       Intervention  Provide advice, education, support and counseling about physical activity/exercise needs.;Develop an individualized exercise prescription for aerobic and resistive training based on initial evaluation findings, risk stratification, comorbidities and participant's personal goals.       Expected Outcomes  Achievement of increased cardiorespiratory fitness and enhanced flexibility, muscular endurance and strength shown through measurements of functional capacity and personal statement of participant.       Able to understand and use rate of perceived exertion (RPE) scale  Yes        Intervention  Provide education and explanation on how to use RPE scale       Expected Outcomes  Short Term: Able to use RPE daily in rehab to express subjective intensity level;Long Term:  Able to use RPE to guide intensity level when exercising independently       Able to understand and use Dyspnea scale  Yes       Intervention  Provide education and explanation on how to use Dyspnea scale       Expected Outcomes  Short Term: Able to use Dyspnea scale daily in rehab to express subjective sense of shortness of breath during exertion;Long Term: Able to use Dyspnea scale to guide intensity level when exercising independently       Knowledge and understanding of Target Heart Rate Range (THRR)  Yes       Intervention  Provide education and explanation of THRR including how the numbers were predicted and where they are located for reference       Expected Outcomes  Short Term: Able to state/look up THRR;Long Term: Able to use THRR to govern intensity when exercising independently;Short Term: Able to use daily as  guideline for intensity in rehab       Able to check pulse independently  Yes       Intervention  Provide education and demonstration on how to check pulse in carotid and radial arteries.;Review the importance of being able to check your own pulse for safety during independent exercise       Expected Outcomes  Short Term: Able to explain why pulse checking is important during independent exercise;Long Term: Able to check pulse independently and accurately       Understanding of Exercise Prescription  Yes       Intervention  Provide education, explanation, and written materials on patient's individual exercise prescription       Expected Outcomes  Short Term: Able to explain program exercise prescription;Long Term: Able to explain home exercise prescription to exercise independently          Exercise Goals Re-Evaluation : Exercise Goals Re-Evaluation    Row Name 09/22/17 1610 09/25/17 1348  10/07/17 1252 10/22/17 1037 10/27/17 0946     Exercise Goal Re-Evaluation   Exercise Goals Review  Increase Physical Activity;Increase Strength and Stamina;Able to understand and use rate of perceived exertion (RPE) scale  Increase Physical Activity;Increase Strength and Stamina  Increase Physical Activity;Increase Strength and Stamina  Increase Physical Activity;Increase Strength and Stamina  Increase Physical Activity;Understanding of Exercise Prescription;Increase Strength and Stamina;Knowledge and understanding of Target Heart Rate Range (THRR);Able to understand and use rate of perceived exertion (RPE) scale;Able to check pulse independently   Comments  Reviewed RPE scale, THR and program prescription with pt today.  Pt voiced understanding and was given a copy of goals to take home.   Kimaya is off to a good start in rehab.  She has completed two full days of exercise.  She did not do well on the treadmil as it made her feel dizzy so we moved her to the track.  We will continue to monitor her progression.   Elvin has increased her speed on TM and is tolerating exercise well. Staff will continue to monitor progress.  Shambria has been doing well in rehab.  She is starting to feel like she is gaining more strength and stamina.   She does walk a little bit at home, to and from her mailbox, but gets short of breath.  We will continue to work with her on the breathing.  Reviewed home exercise with pt today.  Pt plans to walk at home for exercise.  Reviewed THR, pulse, RPE, sign and symptoms, and when to call 911 or MD.  Also discussed weather considerations and indoor options.  Pt voiced understanding.   Expected Outcomes  Short: Use RPE daily to regulate intensity.  Long: Follow program prescription in THR.  Short:  Continue to attend classes regularly.  Long: Follow program prescription for exercise to increase strength and stamina.   Short - Faythe will continue to attend and will add in exercise at home.   Long - Ticara will exercise 3-5 days per week.  Short: Review home exercise guidelines.  Long: Increase home exercise.   Short: Add in two days at home consistently  Long: Start counting pulse some during exercise      Discharge Exercise Prescription (Final Exercise Prescription Changes): Exercise Prescription Changes - 10/27/17 0900      Home Exercise Plan   Plans to continue exercise at  Home (comment) walking   walking   Frequency  Add 2 additional days to program exercise sessions.  Initial Home Exercises Provided  10/27/17       Nutrition:  Target Goals: Understanding of nutrition guidelines, daily intake of sodium <1570m, cholesterol <2071m calories 30% from fat and 7% or less from saturated fats, daily to have 5 or more servings of fruits and vegetables.  Biometrics: Pre Biometrics - 09/17/17 1427      Pre Biometrics   Height  '5\' 5"'  (1.651 m)    Weight  135 lb 8 oz (61.5 kg)    Waist Circumference  29 inches    Hip Circumference  38 inches    Waist to Hip Ratio  0.76 %    BMI (Calculated)  22.55        Nutrition Therapy Plan and Nutrition Goals: Nutrition Therapy & Goals - 10/01/17 1509      Nutrition Therapy   RD appointment defered  Yes not now   not now      Nutrition Discharge: Rate Your Plate Scores: Nutrition Assessments - 09/22/17 1143      MEDFICTS Scores   Pre Score  50       Nutrition Goals Re-Evaluation: Nutrition Goals Re-Evaluation    [place] 10/22/17 1057             Goals   Nutrition Goal  Meet with dietician       Comment  Appt scheduled for 11/6 after class       Expected Outcome  Short: Meet with dietician to help with heart healthy diet.  Long: Follow recommendations          Nutrition Goals Discharge (Final Nutrition Goals Re-Evaluation): Nutrition Goals Re-Evaluation - 10/22/17 1057      Goals   Nutrition Goal  Meet with dietician    Comment  Appt scheduled for 11/6 after class    Expected Outcome  Short: Meet with  dietician to help with heart healthy diet.  Long: Follow recommendations       Psychosocial: Target Goals: Acknowledge presence or absence of significant depression and/or stress, maximize coping skills, provide positive support system. Participant is able to verbalize types and ability to use techniques and skills needed for reducing stress and depression.   Initial Review & Psychosocial Screening: Initial Psych Review & Screening - 09/17/17 1405      Initial Review   Current issues with  Current Stress Concerns    Source of Stress Concerns  Chronic Illness;Transportation;Unable to participate in former interests or hobbies    Comments  Her husband has dementia and since her event and [facility] has taken a lot of stress off of her due to not being his sole caregiver. She still misses having him in the home but visits him almost daily. She lives alone and drives herself so that can be a stressor at times due to having intraoccular lens implants. She would like to attend church regularly again but feels she does not want to drive more than she has to and gets too fatigued once she is there. Once she gets some stamina back we discussed having her find a ride to church.      Family Dynamics   Good Support System?  Yes    Comments  [place] dementia [place] and stay for longer visits to help at times.       Barriers   Psychosocial barriers to participate in program  The  patient should benefit from training in stress management and relaxation.      Screening Interventions   Interventions  Yes;Encouraged to exercise;Program counselor consult;Provide feedback about the scores to participant    Expected Outcomes  Short Term goal: Utilizing psychosocial counselor, staff and physician to assist with identification of specific Stressors or current issues interfering with healing  process. Setting desired goal for each stressor or current issue identified.;Long Term Goal: Stressors or current issues are controlled or eliminated.;Short Term goal: Identification and review with participant of any Quality of Life or Depression concerns found by scoring the questionnaire.;Long Term goal: The participant improves quality of Life and PHQ9 Scores as seen by post scores and/or verbalization of changes       Quality of Life Scores:  Quality of Life - 09/17/17 1412      Quality of Life Scores   Health/Function Pre  17.1 %    Socioeconomic Pre  21.92 %    Psych/Spiritual Pre  22.67 %    Family Pre  24.88 %    GLOBAL Pre  20.11 %       PHQ-9: Recent Review Flowsheet Data    Depression screen [facility] 2/9 09/17/2017   Decreased Interest 0   Down, Depressed, Hopeless 0   PHQ - 2 Score 0   Altered sleeping 0   Tired, decreased energy 1   Change in appetite 0   Feeling bad or failure about yourself  0   Trouble concentrating 1   Moving slowly or fidgety/restless 1   Suicidal thoughts 0   PHQ-9 Score 3   Difficult doing work/chores Not difficult at all     Interpretation of Total Score  Total Score Depression Severity:  1-4 = Minimal depression, 5-9 = Mild depression, 10-14 = Moderate depression, 15-19 = Moderately severe depression, 20-27 = Severe depression   Psychosocial Evaluation and Intervention: Psychosocial Evaluation - 09/22/17 0933      Psychosocial Evaluation & Interventions   Interventions  Encouraged to exercise with the program and follow exercise prescription    Comments  Counselor met with Ms. Minette Brine [facility]) today for initial psychosocial evaluation.  She is an 82 year old who had a CABGx4 earlier this year.  She has a strong support system with a daughter; a sister-in-law; neighbors; and active involvement in her local church community.  Jolanda is married but her spouse has severe dementia and lives in a care center.  Mersadez has diabetes in addition to  her heart issues.  She sleeps well and has a good appetite.  Raelin denies a history of depression or anxiety or any current symptoms and reports typically being in a positive mood.  She does have some stress in her life with her own health and that of her spouse.  Andersyn has goals to walk better and increase her stamina while in this program.  She will be followed by staff.     Expected Outcomes  Takeira will benefit from consistent exercise to achieve her stated goals.  The educational and psychoeducational components of this program will be helpful in understanding and coping more positively with her heart condition.  Staff will follow.    Continue Psychosocial Services   Follow up required by staff       Psychosocial Re-Evaluation: Psychosocial Re-Evaluation    [place] Name 10/22/17 1058             Psychosocial Re-Evaluation   Current issues with  Current Stress Concerns  Comments  Haydyn has been doing well in rehab.  She continues to stress over her healthy issues, but overall has remained postive.  She contineus to sleep well and denies symptoms of depression.        Expected Outcomes  Short: Continue to attend class.  Long: Remain postive and cope with health issues.          Initial Review   Source of Stress Concerns  Chronic Illness          Psychosocial Discharge (Final Psychosocial Re-Evaluation): Psychosocial Re-Evaluation - 10/22/17 1058      Psychosocial Re-Evaluation   Current issues with  Current Stress Concerns    Comments  Naeema has been doing well in rehab.  She continues to stress over her healthy issues, but overall has remained postive.  She contineus to sleep well and denies symptoms of depression.     Expected Outcomes  Short: Continue to attend class.  Long: Remain postive and cope with health issues.       Initial Review   Source of Stress Concerns  Chronic Illness       Vocational Rehabilitation: Provide vocational rehab assistance to qualifying  candidates.   Vocational Rehab Evaluation & Intervention: Vocational Rehab - 09/17/17 1423      Initial Vocational Rehab Evaluation & Intervention   Assessment shows need for Vocational Rehabilitation  No       Education: Education Goals: Education classes will be provided on a variety of topics geared toward better understanding of heart health and risk factor modification. Participant will state understanding/return demonstration of topics presented as noted by education test scores.  Learning Barriers/Preferences: Learning Barriers/Preferences - 09/17/17 1421      Learning Barriers/Preferences   Learning Barriers  Sight;Exercise Concerns Has bilateral intraoccular lens implants, has appointment with optometrist tomorrow.  Does not walk well so has concerns about exercise and being able to do it.    Has bilateral intraoccular lens implants, has appointment with optometrist tomorrow.  Does not walk well so has concerns about exercise and being able to do it.    Learning Preferences  None       Education Topics: General Nutrition Guidelines/Fats and Fiber: -Group instruction provided by verbal, written material, models and posters to present the general guidelines for heart healthy nutrition. Gives an explanation and review of dietary fats and fiber.   Cardiac Rehab from 10/27/2017 in [facility] Cardiac and Pulmonary Rehab  Date  10/06/17  Educator  PI  Instruction Review Code  1- Verbalizes Understanding      Controlling Sodium/Reading Food Labels: -Group verbal and written material supporting the discussion of sodium use in heart healthy nutrition. Review and explanation with models, verbal and written materials for utilization of the food label.   Cardiac Rehab from 10/27/2017 in [facility] Cardiac and Pulmonary Rehab  Date  10/13/17  Educator  CR  Instruction Review Code  1- Verbalizes Understanding      Exercise Physiology & Risk Factors: - Group verbal and written instruction with  models to review the exercise physiology of the cardiovascular system and associated critical values. Details cardiovascular disease risk factors and the goals associated with each risk factor.   Aerobic Exercise & Resistance Training: - Gives group verbal and written discussion on the health impact of inactivity. On the components of aerobic and resistive training programs and the benefits of this training and how to safely progress through these programs.   Cardiac Rehab from 10/27/2017 in [facility] Cardiac and  Pulmonary Rehab  Date  10/22/17  Educator  [facility]  Instruction Review Code  1- Verbalizes Understanding      Flexibility, Balance, General Exercise Guidelines: - Provides group verbal and written instruction on the benefits of flexibility and balance training programs. Provides general exercise guidelines with specific guidelines to those with heart or lung disease. Demonstration and skill practice provided.   Cardiac Rehab from 10/27/2017 in [facility] Cardiac and Pulmonary Rehab  Date  10/27/17  Educator  AS  Instruction Review Code  1- Verbalizes Understanding      Stress Management: - Provides group verbal and written instruction about the health risks of elevated stress, cause of high stress, and healthy ways to reduce stress.   Depression: - Provides group verbal and written instruction on the correlation between heart/lung disease and depressed mood, treatment options, and the stigmas associated with seeking treatment.   Cardiac Rehab from 10/27/2017 in [facility] Cardiac and Pulmonary Rehab  Date  09/29/17  Educator  [facility]  Instruction Review Code  1- Verbalizes Understanding      Anatomy & Physiology of the Heart: - Group verbal and written instruction and models provide basic cardiac anatomy and physiology, with the coronary electrical and arterial systems. Review of: AMI, Angina, Valve disease, Heart Failure, Cardiac Arrhythmia, Pacemakers, and the ICD.   Cardiac Procedures: - Group  verbal and written instruction to review commonly prescribed medications for heart disease. Reviews the medication, class of the drug, and side effects. Includes the steps to properly store meds and maintain the prescription regimen. (beta blockers and nitrates)   Cardiac Medications I: - Group verbal and written instruction to review commonly prescribed medications for heart disease. Reviews the medication, class of the drug, and side effects. Includes the steps to properly store meds and maintain the prescription regimen.   Cardiac Rehab from 10/27/2017 in [facility] Cardiac and Pulmonary Rehab  Date  09/22/17  Educator  MA  Instruction Review Code  1- Verbalizes Understanding      Cardiac Medications II: -Group verbal and written instruction to review commonly prescribed medications for heart disease. Reviews the medication, class of the drug, and side effects. (all other drug classes)    Go Sex-Intimacy & Heart Disease, Get SMART - Goal Setting: - Group verbal and written instruction through game format to discuss heart disease and the return to sexual intimacy. Provides group verbal and written material to discuss and apply goal setting through the application of the S.M.A.R.T. Method.   Other Matters of the Heart: - Provides group verbal, written materials and models to describe Heart Failure, Angina, Valve Disease, Peripheral Artery Disease, and Diabetes in the realm of heart disease. Includes description of the disease process and treatment options available to the cardiac patient.   Exercise & Equipment Safety: - Individual verbal instruction and demonstration of equipment use and safety with use of the equipment.   Cardiac Rehab from 10/27/2017 in [facility] Cardiac and Pulmonary Rehab  Date  09/17/17  Educator  [place]  Instruction Review Code  1- Verbalizes Understanding      Infection Prevention: - Provides verbal and written material to individual with discussion of infection control  including proper hand washing and proper equipment cleaning during exercise session.   Cardiac Rehab from 10/27/2017 in [facility] Cardiac and Pulmonary Rehab  Date  09/17/17  Educator  [place]  Instruction Review Code  1- Verbalizes Understanding      Falls Prevention: - Provides verbal and written material to individual with discussion of falls  prevention and safety.   Cardiac Rehab from 10/27/2017 in [facility] Cardiac and Pulmonary Rehab  Date  09/17/17  Educator  [place]  Instruction Review Code  1- Verbalizes Understanding      Diabetes: - Individual verbal and written instruction to review signs/symptoms of diabetes, desired ranges of glucose level fasting, after meals and with exercise. Acknowledge that pre and post exercise glucose checks will be done for 3 sessions at entry of program.   Cardiac Rehab from 10/27/2017 in [facility] Cardiac and Pulmonary Rehab  Date  09/17/17  Educator  [place]  Instruction Review Code  1- Verbalizes Understanding      Other: -Provides group and verbal instruction on various topics (see comments)    Knowledge Questionnaire Score: Knowledge Questionnaire Score - 09/17/17 1423      Knowledge Questionnaire Score   Pre Score  23/28 Answers reviewed with patient who verbalized understanding.    Answers reviewed with patient who verbalized understanding.       Core Components/Risk Factors/Patient Goals at Admission: Personal Goals and Risk Factors at Admission - 09/17/17 1358      Core Components/Risk Factors/Patient Goals on Admission    Weight Management  Weight Maintenance;Yes    Intervention  Weight Management: Provide education and appropriate resources to help participant work on and attain dietary goals.;Weight Management: Develop a combined nutrition and exercise program designed to reach desired caloric intake, while maintaining appropriate intake of nutrient and fiber, sodium and fats, and appropriate energy expenditure required for the weight goal.    Admit  Weight  135 lb 8 oz (61.5 kg)    Expected Outcomes  Understanding recommendations for meals to include 15-35% energy as protein, 25-35% energy from fat, 35-60% energy from carbohydrates, less than 250m of dietary cholesterol, 20-35 gm of total fiber daily;Understanding of distribution of calorie intake throughout the day with the consumption of 4-5 meals/snacks;Weight Maintenance: Understanding of the daily nutrition guidelines, which includes 25-35% calories from fat, 7% or less cal from saturated fats, less than 20102mcholesterol, less than 1.5gm of sodium, & 5 or more servings of fruits and vegetables daily    Diabetes  Yes    Intervention  Provide education about signs/symptoms and action to take for hypo/hyperglycemia.;Provide education about proper nutrition, including hydration, and aerobic/resistive exercise prescription along with prescribed medications to achieve blood glucose in normal ranges: Fasting glucose 65-99 mg/dL    Expected Outcomes  Short Term: Participant verbalizes understanding of the signs/symptoms and immediate care of hyper/hypoglycemia, proper foot care and importance of medication, aerobic/resistive exercise and nutrition plan for blood glucose control.;Long Term: Attainment of HbA1C < 7%.    Heart Failure  Yes    Intervention  Provide a combined exercise and nutrition program that is supplemented with education, support and counseling about heart failure. Directed toward relieving symptoms such as shortness of breath, decreased exercise tolerance, and extremity edema.    Expected Outcomes  Improve functional capacity of life;Short term: Attendance in program 2-3 days a week with increased exercise capacity. Reported lower sodium intake. Reported increased fruit and vegetable intake. Reports medication compliance.;Short term: Daily weights obtained and reported for increase. Utilizing diuretic protocols set by physician.;Long term: Adoption of self-care skills and reduction of  barriers for early signs and symptoms recognition and intervention leading to self-care maintenance.    Hypertension  Yes    Intervention  Provide education on lifestyle modifcations including regular physical activity/exercise, weight management, moderate sodium restriction and increased consumption of fresh fruit, vegetables, and low  fat dairy, alcohol moderation, and smoking cessation.;Monitor prescription use compliance.    Expected Outcomes  Short Term: Continued assessment and intervention until BP is < 140/67m HG in hypertensive participants. < 130/854mHG in hypertensive participants with diabetes, heart failure or chronic kidney disease.;Long Term: Maintenance of blood pressure at goal levels.    Lipids  Yes    Intervention  Provide education and support for participant on nutrition & aerobic/resistive exercise along with prescribed medications to achieve LDL <7070mHDL >42m27m  Expected Outcomes  Short Term: Participant states understanding of desired cholesterol values and is compliant with medications prescribed. Participant is following exercise prescription and nutrition guidelines.;Long Term: Cholesterol controlled with medications as prescribed, with individualized exercise RX and with personalized nutrition plan. Value goals: LDL < 70mg10mL > 40 mg.    Personal Goal Other  Yes    Personal Goal  She would like to feel well enough to attend church regularly and not feet poorly or fatigued during.     Intervention  Patient will come to cardiac rehab and work on building stamina and strength.     Expected Outcomes  She will gain enough strength and stamina to attend church regularly.        Core Components/Risk Factors/Patient Goals Review:  Goals and Risk Factor Review    Row Name 10/22/17 1052             Core Components/Risk Factors/Patient Goals Review   Personal Goals Review  Weight Management/Obesity;Hypertension;Lipids;Heart Failure;Diabetes       Review  FanniTwanna been doing well in rehab.  She continues to have SOB and swelling with her heart failure. She saw the doctor yesterday and they adjusted some of her medications and increased her lasix.  Her weight was thus down some this morning and her swelling starting to improve.  Her blood pressures and blood sugars have been doing well and she monitors them at home.        Expected Outcomes  Short: Continue to work on swelling in ankles.  Long: Manage heart failure symptoms.           Core Components/Risk Factors/Patient Goals at Discharge (Final Review):  Goals and Risk Factor Review - 10/22/17 1052      Core Components/Risk Factors/Patient Goals Review   Personal Goals Review  Weight Management/Obesity;Hypertension;Lipids;Heart Failure;Diabetes    Review  FanniDalasiabeen doing well in rehab.  She continues to have SOB and swelling with her heart failure. She saw the doctor yesterday and they adjusted some of her medications and increased her lasix.  Her weight was thus down some this morning and her swelling starting to improve.  Her blood pressures and blood sugars have been doing well and she monitors them at home.     Expected Outcomes  Short: Continue to work on swelling in ankles.  Long: Manage heart failure symptoms.        ITP Comments: ITP Comments    Row Name 09/17/17 1354 09/30/17 0657 10/20/17 1541 10/28/17 0655     ITP Comments  Med review completed. Initial ITP created. Diagnosis information can be found in [facility] encouter dated 08/19/2017.  30 day Review. Continue with ITP unless directed changes per Medical Director Review.   Return call to pt from this morning. She has been out since last Tuesday with swelling in her ankles. She has an appointment tomorrow with Dr. BensiHaroldine LawsI encouraged her to show him her ankles. I also encouraged  her to return to rehab on Thursday.   30 day review. Continue with ITP unless directed changes per Medical Director review.        Comments:

## 2017-10-29 DIAGNOSIS — I5022 Chronic systolic (congestive) heart failure: Secondary | ICD-10-CM

## 2017-10-29 DIAGNOSIS — Z951 Presence of aortocoronary bypass graft: Secondary | ICD-10-CM

## 2017-10-29 NOTE — Progress Notes (Signed)
Daily Session Note  Patient Details  Name: [name] [name] MRN: [id number] Date of Birth: February 23, 1935 Referring Provider:     Cardiac Rehab from 09/17/2017 in [facility] Cardiac and Pulmonary Rehab  Referring Provider  Bensimhon      Encounter Date: 10/29/2017  Check In: Session Check In - 10/29/17 0840      Check-In   Location  [facility]-Cardiac & Pulmonary Rehab    Staff Present  Renita Papa, RN Vickki Hearing, BA, ACSM CEP, Exercise Physiologist;Jessica Luan Pulling, [place], ACSM RCEP, Exercise Physiologist    Supervising physician immediately available to respond to emergencies  See telemetry face sheet for immediately available ER MD    Medication changes reported      No    Fall or balance concerns reported     No    Warm-up and Cool-down  Performed on first and last piece of equipment    Resistance Training Performed  Yes    VAD Patient?  No      Pain Assessment   Currently in Pain?  No/denies    Multiple Pain Sites  No          Social History   Tobacco Use  Smoking Status Never Smoker  Smokeless Tobacco Never Used    Goals Met:  Independence with exercise equipment Exercise tolerated well No report of cardiac concerns or symptoms Strength training completed today  Goals Unmet:  Not Applicable  Comments: Pt able to follow exercise prescription today without complaint.  Will continue to monitor for progression.    Dr. Emily Filbert is Medical Director for [place] and [facility].

## 2017-11-03 ENCOUNTER — Other Ambulatory Visit: Payer: Self-pay | Admitting: Nephrology

## 2017-11-03 DIAGNOSIS — R319 Hematuria, unspecified: Secondary | ICD-10-CM

## 2017-11-03 DIAGNOSIS — I5022 Chronic systolic (congestive) heart failure: Secondary | ICD-10-CM

## 2017-11-03 DIAGNOSIS — Z951 Presence of aortocoronary bypass graft: Secondary | ICD-10-CM

## 2017-11-03 NOTE — Progress Notes (Signed)
Daily Session Note  Patient Details  Name: [name] [name] MRN: [id number] Date of Birth: 1935-01-05 Referring Provider:     Cardiac Rehab from 09/17/2017 in [facility] Cardiac and Pulmonary Rehab  Referring Provider  Bensimhon      Encounter Date: 11/03/2017  Check In: Session Check In - 11/03/17 0858      Check-In   Location  [facility]-Cardiac & Pulmonary Rehab    Staff Present  Nyoka Cowden, RN, BSN, Kela Millin, BA, ACSM CEP, Exercise Physiologist;Jonluke Cobbins Luan Pulling, [place], ACSM RCEP, Exercise Physiologist    Supervising physician immediately available to respond to emergencies  See telemetry face sheet for immediately available ER MD    Medication changes reported      No    Fall or balance concerns reported     No    Warm-up and Cool-down  Performed on first and last piece of equipment    Resistance Training Performed  Yes    VAD Patient?  No      Pain Assessment   Currently in Pain?  No/denies          Social History   Tobacco Use  Smoking Status Never Smoker  Smokeless Tobacco Never Used    Goals Met:  Independence with exercise equipment Exercise tolerated well No report of cardiac concerns or symptoms Strength training completed today  Goals Unmet:  Not Applicable  Comments: Pt able to follow exercise prescription today without complaint.  Will continue to monitor for progression.    Dr. Emily Filbert is Medical Director for [place] and [facility].

## 2017-11-04 ENCOUNTER — Ambulatory Visit (HOSPITAL_COMMUNITY)
Admission: RE | Admit: 2017-11-04 | Discharge: 2017-11-04 | Disposition: A | Discharge status: Home / Self Care | Payer: Medicare Other | Source: Ambulatory Visit | Attending: Internal Medicine | Admitting: Internal Medicine

## 2017-11-04 ENCOUNTER — Encounter (HOSPITAL_COMMUNITY): Payer: Self-pay

## 2017-11-04 VITALS — BP 138/52 | HR 56 | Wt 146.0 lb

## 2017-11-04 DIAGNOSIS — Z7982 Long term (current) use of aspirin: Secondary | ICD-10-CM | POA: Diagnosis not present

## 2017-11-04 DIAGNOSIS — Z79899 Other long term (current) drug therapy: Secondary | ICD-10-CM | POA: Diagnosis not present

## 2017-11-04 DIAGNOSIS — N184 Chronic kidney disease, stage 4 (severe): Secondary | ICD-10-CM

## 2017-11-04 DIAGNOSIS — I5022 Chronic systolic (congestive) heart failure: Secondary | ICD-10-CM | POA: Insufficient documentation

## 2017-11-04 DIAGNOSIS — I2510 Atherosclerotic heart disease of native coronary artery without angina pectoris: Secondary | ICD-10-CM

## 2017-11-04 DIAGNOSIS — I214 Non-ST elevation (NSTEMI) myocardial infarction: Secondary | ICD-10-CM | POA: Insufficient documentation

## 2017-11-04 DIAGNOSIS — I130 Hypertensive heart and chronic kidney disease with heart failure and stage 1 through stage 4 chronic kidney disease, or unspecified chronic kidney disease: Secondary | ICD-10-CM | POA: Diagnosis present

## 2017-11-04 DIAGNOSIS — E1122 Type 2 diabetes mellitus with diabetic chronic kidney disease: Secondary | ICD-10-CM | POA: Diagnosis not present

## 2017-11-04 DIAGNOSIS — Z952 Presence of prosthetic heart valve: Secondary | ICD-10-CM | POA: Diagnosis not present

## 2017-11-04 DIAGNOSIS — I480 Paroxysmal atrial fibrillation: Secondary | ICD-10-CM | POA: Diagnosis not present

## 2017-11-04 DIAGNOSIS — E785 Hyperlipidemia, unspecified: Secondary | ICD-10-CM | POA: Diagnosis not present

## 2017-11-04 DIAGNOSIS — Z953 Presence of xenogenic heart valve: Secondary | ICD-10-CM | POA: Diagnosis not present

## 2017-11-04 DIAGNOSIS — Z951 Presence of aortocoronary bypass graft: Secondary | ICD-10-CM | POA: Insufficient documentation

## 2017-11-04 LAB — BASIC METABOLIC PANEL
ANION GAP: 5 (ref 5–15)
BUN: 21 mg/dL — ABNORMAL HIGH (ref 6–20)
CALCIUM: 9.9 mg/dL (ref 8.9–10.3)
CO2: 26 mmol/L (ref 22–32)
Chloride: 107 mmol/L (ref 101–111)
Creatinine, Ser: 2.84 mg/dL — ABNORMAL HIGH (ref 0.44–1.00)
GFR, EST AFRICAN AMERICAN: 17 mL/min — AB (ref >60)
GFR, EST NON AFRICAN AMERICAN: 15 mL/min — AB (ref >60)
Glucose, Bld: 116 mg/dL — ABNORMAL HIGH (ref 65–99)
POTASSIUM: 3.7 mmol/L (ref 3.5–5.1)
Sodium: 138 mmol/L (ref 135–145)

## 2017-11-04 NOTE — Patient Instructions (Signed)
Routine lab work today. Will notify you of abnormal results, otherwise no news is good news!  Follow up 4-6 weeks with Dr. Haroldine Laws.  ______________________________________________________________  [name] [name] Code:  Take all medication as prescribed the day of your appointment. Bring all medications with you to your appointment.  Do the following things EVERYDAY: 1) Weigh yourself in the morning before breakfast. Write it down and keep it in a log. 2) Take your medicines as prescribed 3) Eat low salt foods-Limit salt (sodium) to 2000 mg per day.  4) Stay as active as you can everyday 5) Limit all fluids for the day to less than 2 liters

## 2017-11-04 NOTE — Progress Notes (Signed)
Advanced Heart Failure Clinic Note   PCP: Dr. Ouida Sills Primary Cardiologist: Bensimhon  HPI:  [name] [name] is a 82 year old female with a history of type 2 diabetes mellitus, hypertension, hyperlipidemia and CKD.   Admiited on 02/03/2017 with NSTEMI. Emergent cardiac catheterization which showed three-vessel coronary artery disease. She developed profound shock with multi-system organ failure and found to have severe MR due to partial flail of MV. Underwent emergent CABG x 4 (Coronary artery bypass grafting x 4   Left internal mammary graft to the LAD  Sequential SVG to diagonal and OM  SVG to PDA and bioprosthetic MVR   Had prolonged post-op course with renal failure, PAF (resolved with amio), paralyzed left vocal cord and HF. Post-op Echo with EF 40% RV normal, and stable MVR. ECHO 3/4 EF 20-25% RV mild-moderately reduced. Trivial pericardial effusion.   On d/c was 152 pounds.  Cr 2.3  She presents today for 2 week follow up. At last visit lasix increased. Weight down 8 lbs from that visit. Saw renal last week and they increased her lasix again to BID. Has renal US this Friday. Her breathing has improved, but she remains slightly orthopneic.  Denies SOB getting around the house.  No PND or CP.  Peripheral edema much improved. Wearing compression hose daily.   Echo 8/18. EF ~25- 30% MVR stable   Review of systems complete and found to be negative unless listed in HPI.    SH:  Social History   Socioeconomic History  . Marital status: Married    Spouse name: Not on file  . Number of children: Not on file  . Years of education: Not on file  . Highest education level: Not on file  Social Needs  . Financial resource strain: Not on file  . Food insecurity - worry: Not on file  . Food insecurity - inability: Not on file  . Transportation needs - medical: Not on file  . Transportation needs - non-medical: Not on file  Occupational History  . Not on file  Tobacco Use  .  Smoking status: Never Smoker  . Smokeless tobacco: Never Used  Substance and Sexual Activity  . Alcohol use: No  . Drug use: No  . Sexual activity: Not on file  Other Topics Concern  . Not on file  Social History Narrative  . Not on file   FH:  Family History  Problem Relation Age of Onset  . Diabetes Father   . Diabetes Sister    Past Medical History:  Diagnosis Date  . [name]    a. LHC 02/03/17 for unstable angina showed severe 3v CAD w/ sequential 70% & 80% p-mLAD, 99% mLCx, CTO mRCA, elevated LV filling pressure  . Colon polyp 2010  . Diabetes mellitus with complication ([place])   . Glaucoma   . Hypertension     Current Outpatient Medications  Medication Sig Dispense Refill  . acetaminophen (TYLENOL) 500 MG tablet Take 500 mg by mouth every 6 (six) hours as needed for mild pain.     Marland Kitchen aspirin EC 81 MG tablet Take 1 tablet (81 mg total) by mouth daily. 90 tablet 3  . carvedilol (COREG) 3.125 MG tablet TAKE 1 TABLET BY MOUTH TWICE DAILY 180 tablet 3  . furosemide (LASIX) 40 MG tablet Take 40 mg 2 (two) times daily by mouth.    . hydrALAZINE (APRESOLINE) 50 MG tablet Take 0.5 tablets (25 mg total) by mouth 2 (two) times daily. HOLD IF systolic BP <  120 60 tablet 0  . isosorbide mononitrate (IMDUR) 30 MG 24 hr tablet TAKE 1 TABLET BY MOUTH ONCE DAILY 90 tablet 3  . latanoprost (XALATAN) 0.005 % ophthalmic solution Place 1 drop into both eyes at bedtime.     . Potassium Chloride ER 20 MEQ TBCR Take 20 mEq by mouth daily. 30 tablet 3  . rosuvastatin (CRESTOR) 40 MG tablet Take 1 tablet (40 mg total) by mouth daily. 90 tablet 3  . timolol (BETIMOL) 0.5 % ophthalmic solution Place 1 drop into both eyes 2 (two) times daily.     No current facility-administered medications for this encounter.    Vitals:   11/04/17 1036  BP: (!) 138/52  Pulse: (!) 56  SpO2: 100%  Weight: 146 lb (66.2 kg)   Wt Readings from Last 3 Encounters:  11/04/17 146 lb (66.2 kg)  10/21/17  154 lb (69.9 kg)  09/17/17 135 lb 8 oz (61.5 kg)    PHYSICAL EXAM:  General: NAD HEENT: Normal Neck: Supple. JVP 7-8cm. Carotids 2+ bilat; no bruits. No thyromegaly or nodule noted. Cor: PMI nondisplaced. RRR, No M/G/R noted Lungs: CTAB, normal effort. Abdomen: Soft, non-tender, non-distended, no HSM. No bruits or masses. +BS  Extremities: No cyanosis, clubbing, or rash. R and LLE no edema.  Neuro: Alert & orientedx3, cranial nerves grossly intact. moves all 4 extremities w/o difficulty. Affect pleasant   ASSESSMENT & PLAN:  1. NSTEMI with CAD [name] on 02/05/17 - No s/s of ischemia.    - Stop coumadin. Continue ECASA 81.  - Continue b-blocker and statin - [facility] cardiac [facility] at [facility]  2. Severe MR with flail segment s/p MVR c/ bioprosthetic valve. - Valve stable on recent echo.  - Knows she will need SBE prophylaxis with any dental procedures.  3. Chronic systolic HF :  - Echo 05/629 LVEF 25-30%, MVR stable.  - Overall stable. NYHA II-III.  - Volume status mildly elevated but improved.  - Continue lasix 40 mg BID, renal just increased. BMET today.  - No spiro with severe CKD.  - Continue hydralazine 50 mg TID.  Will leave room for renal perfusion so will not increase further at this time.  - Continue imdur 30 mg daily.  - Continue carvedilol 3.125 bid - Renal function prohibits Entresto initiation  - Have previously disussed ICD but have decided to defer given age, improving EF and comorbidities - particulary CKD 4 with possible need for HD. - Reinforced fluid restriction to < 2 L daily, sodium restriction to less than 2000 mg daily, and the importance of daily weights.   4. CKD stage 4 - BMET today. Continue to follow with Nephrology. Have discussed possible need for HD at some point.  5. PAF, post-op - NSR on exam.  - She is off amio.  - Now off coumadin. Continue ASA 80 mg daily.  6. DM2 - Per PCP.   Doing well overall, but worry about her renal trajectory.  BMET today. She has renal follow up again next week.  RTC 4-6 weeks to see MD.   Shirley Friar, PA-C  10:51 AM

## 2017-11-06 ENCOUNTER — Ambulatory Visit
Admission: RE | Admit: 2017-11-06 | Discharge: 2017-11-06 | Disposition: A | Discharge status: Home / Self Care | Payer: Medicare Other | Source: Ambulatory Visit | Attending: Nephrology | Admitting: Nephrology

## 2017-11-06 DIAGNOSIS — N200 Calculus of kidney: Secondary | ICD-10-CM | POA: Insufficient documentation

## 2017-11-06 DIAGNOSIS — N1330 Unspecified hydronephrosis: Secondary | ICD-10-CM | POA: Diagnosis not present

## 2017-11-06 DIAGNOSIS — R319 Hematuria, unspecified: Secondary | ICD-10-CM

## 2017-11-10 DIAGNOSIS — Z951 Presence of aortocoronary bypass graft: Secondary | ICD-10-CM

## 2017-11-10 DIAGNOSIS — I5022 Chronic systolic (congestive) heart failure: Secondary | ICD-10-CM

## 2017-11-10 NOTE — Progress Notes (Signed)
Daily Session Note  Patient Details  Name: [name] [name] MRN: [id number] Date of Birth: [birth year] Referring Provider:     Cardiac Rehab from 09/17/2017 in [facility] Cardiac and Pulmonary Rehab  Referring Provider  Bensimhon      Encounter Date: 11/10/2017  Check In: Session Check In - 11/10/17 0837      Check-In   Location  [facility]-Cardiac & Pulmonary Rehab    Staff Present  Nada Maclachlan, BA, ACSM CEP, Exercise Physiologist;Susanne Bice, RN, BSN, CCRP;Jessica Luan Pulling, MA, ACSM RCEP, Exercise Physiologist;Diane Joya Gaskins RN,BSN;Joseph [employer] physician immediately available to respond to emergencies  See telemetry face sheet for immediately available ER MD    Medication changes reported      No    Fall or balance concerns reported     No    Warm-up and Cool-down  Performed on first and last piece of equipment    Resistance Training Performed  Yes    VAD Patient?  No      Pain Assessment   Currently in Pain?  No/denies    Multiple Pain Sites  No          Social History   Tobacco Use  Smoking Status Never Smoker  Smokeless Tobacco Never Used    Goals Met:  Independence with exercise equipment Exercise tolerated well Personal goals reviewed No report of cardiac concerns or symptoms Strength training completed today  Goals Unmet:  Not Applicable  Comments: Pt able to follow exercise prescription today without complaint.  Will continue to monitor for progression. See ITP for review   Dr. Emily Filbert is Medical Director for [place] and [facility].

## 2017-11-17 ENCOUNTER — Encounter: Payer: Medicare Other | Admitting: *Deleted

## 2017-11-17 DIAGNOSIS — Z951 Presence of aortocoronary bypass graft: Secondary | ICD-10-CM

## 2017-11-17 DIAGNOSIS — I5022 Chronic systolic (congestive) heart failure: Secondary | ICD-10-CM | POA: Diagnosis not present

## 2017-11-17 NOTE — Progress Notes (Signed)
Daily Session Note  Patient Details  Name: [name] [name] MRN: [id number] Date of Birth: 02-01-1935 Referring Provider:     Cardiac Rehab from 09/17/2017 in [facility] Cardiac and Pulmonary Rehab  Referring Provider  Bensimhon      Encounter Date: 11/17/2017  Check In: Session Check In - 11/17/17 0824      Check-In   Location  [facility]-Cardiac & Pulmonary Rehab    Staff Present  Nada Maclachlan, BA, ACSM CEP, Exercise Physiologist;Joseph Christain [place], RN BSN;Shayley Medlin Luan Pulling, [place], ACSM RCEP, Exercise Physiologist    Supervising physician immediately available to respond to emergencies  See telemetry face sheet for immediately available ER MD    Medication changes reported      No    Fall or balance concerns reported     No    Warm-up and Cool-down  Performed on first and last piece of equipment    Resistance Training Performed  Yes    VAD Patient?  No      Pain Assessment   Currently in Pain?  No/denies          Social History   Tobacco Use  Smoking Status Never Smoker  Smokeless Tobacco Never Used    Goals Met:  Independence with exercise equipment Exercise tolerated well No report of cardiac concerns or symptoms Strength training completed today  Goals Unmet:  Not Applicable  Comments: Pt able to follow exercise prescription today without complaint.  Will continue to monitor for progression.    Dr. Emily Filbert is Medical Director for [place] and [facility].

## 2017-11-19 DIAGNOSIS — I5022 Chronic systolic (congestive) heart failure: Secondary | ICD-10-CM

## 2017-11-19 DIAGNOSIS — Z951 Presence of aortocoronary bypass graft: Secondary | ICD-10-CM

## 2017-11-19 NOTE — Progress Notes (Signed)
Daily Session Note  Patient Details  Name: [name] [name] MRN: [id number] Date of Birth: [birth year] Referring Provider:     Cardiac Rehab from 09/17/2017 in [facility]  Referring Provider  Bensimhon      Encounter Date: 11/19/2017  Check In: Session Check In - 11/19/17 0844      Check-In   Location  [facility]    Staff Present  Renita Papa, RN Vickki Hearing, BA, ACSM CEP, Exercise Physiologist;Other;Jessica Luan Pulling, MA, ACSM RCEP, Exercise Physiologist  (Significant)  Benay Pike    Supervising physician immediately available to respond to emergencies  See telemetry face sheet for immediately available ER MD    Medication changes reported      No    Fall or balance concerns reported     No    Warm-up and Cool-down  Performed on first and last piece of equipment    Resistance Training Performed  Yes    VAD Patient?  No      Pain Assessment   Currently in Pain?  No/denies    Multiple Pain Sites  No          Social History   Tobacco Use  Smoking Status Never Smoker  Smokeless Tobacco Never Used    Goals Met:  Independence with exercise equipment Exercise tolerated well No report of cardiac concerns or symptoms Strength training completed today  Goals Unmet:  Not Applicable  Comments: Pt able to follow exercise prescription today without complaint.  Will continue to monitor for progression.    Dr. Emily Filbert is Medical Director for [place] and [facility].

## 2017-11-23 ENCOUNTER — Other Ambulatory Visit: Payer: Self-pay

## 2017-11-23 ENCOUNTER — Other Ambulatory Visit: Payer: Self-pay | Admitting: Nephrology

## 2017-11-23 DIAGNOSIS — N281 Cyst of kidney, acquired: Secondary | ICD-10-CM

## 2017-11-23 DIAGNOSIS — N1330 Unspecified hydronephrosis: Secondary | ICD-10-CM

## 2017-11-24 ENCOUNTER — Encounter: Payer: Medicare Other | Attending: Internal Medicine

## 2017-11-24 DIAGNOSIS — I5022 Chronic systolic (congestive) heart failure: Secondary | ICD-10-CM | POA: Insufficient documentation

## 2017-11-24 DIAGNOSIS — Z951 Presence of aortocoronary bypass graft: Secondary | ICD-10-CM | POA: Insufficient documentation

## 2017-11-24 NOTE — Progress Notes (Signed)
Daily Session Note  Patient Details  Name: [name] [name] MRN: [id number] Date of Birth: 08/01/1935 Referring Provider:     Cardiac Rehab from 09/17/2017 in [facility]  Referring Provider  Bensimhon      Encounter Date: 11/24/2017  Check In: Session Check In - 11/24/17 0839      Check-In   Location  [facility]    Staff Present  Amanda Sommer, BA, ACSM CEP, Exercise Physiologist;Susanne Bice, RN, BSN, CCRP;Jessica Hawkins, MA, ACSM RCEP, Exercise Physiologist    Supervising physician immediately available to respond to emergencies  See telemetry face sheet for immediately available ER MD    Medication changes reported      No    Fall or balance concerns reported     No    Warm-up and Cool-down  Performed on first and last piece of equipment    Resistance Training Performed  Yes    VAD Patient?  No      Pain Assessment   Currently in Pain?  No/denies    Multiple Pain Sites  No          Social History   Tobacco Use  Smoking Status Never Smoker  Smokeless Tobacco Never Used    Goals Met:  Independence with exercise equipment Exercise tolerated well No report of cardiac concerns or symptoms Strength training completed today  Goals Unmet:  Not Applicable  Comments: Pt able to follow exercise prescription today without complaint.  Will continue to monitor for progression.    Dr. Mark Miller is Medical Director for [facility] and [facility]. 

## 2017-11-25 ENCOUNTER — Encounter: Payer: Self-pay | Admitting: *Deleted

## 2017-11-25 DIAGNOSIS — Z951 Presence of aortocoronary bypass graft: Secondary | ICD-10-CM

## 2017-11-25 NOTE — Progress Notes (Signed)
Cardiac Individual Treatment Plan  Patient Details  Name: [name] [name] MRN: [id number] Date of Birth: March 13, 1935 Referring Provider:     Cardiac Rehab from 09/17/2017 in [facility] Cardiac and Pulmonary Rehab  Referring Provider  Bensimhon      Initial Encounter Date:    Cardiac Rehab from 09/17/2017 in [facility] Cardiac and Pulmonary Rehab  Date  09/17/17  Referring Provider  Bensimhon      Visit Diagnosis: [name]  Patient's Home Medications on Admission:  Current Outpatient Medications:  .  acetaminophen (TYLENOL) 500 MG tablet, Take 500 mg by mouth every 6 (six) hours as needed for mild pain. , Disp: , Rfl:  .  aspirin EC 81 MG tablet, Take 1 tablet (81 mg total) by mouth daily., Disp: 90 tablet, Rfl: 3 .  carvedilol (COREG) 3.125 MG tablet, TAKE 1 TABLET BY MOUTH TWICE DAILY, Disp: 180 tablet, Rfl: 3 .  furosemide (LASIX) 40 MG tablet, Take 40 mg 2 (two) times daily by mouth., Disp: , Rfl:  .  hydrALAZINE (APRESOLINE) 50 MG tablet, Take 0.5 tablets (25 mg total) by mouth 2 (two) times daily. HOLD IF systolic BP <354, Disp: 60 tablet, Rfl: 0 .  isosorbide mononitrate (IMDUR) 30 MG 24 hr tablet, TAKE 1 TABLET BY MOUTH ONCE DAILY, Disp: 90 tablet, Rfl: 3 .  latanoprost (XALATAN) 0.005 % ophthalmic solution, Place 1 drop into both eyes at bedtime. , Disp: , Rfl:  .  Potassium Chloride ER 20 MEQ TBCR, Take 20 mEq by mouth daily., Disp: 30 tablet, Rfl: 3 .  rosuvastatin (CRESTOR) 40 MG tablet, Take 1 tablet (40 mg total) by mouth daily., Disp: 90 tablet, Rfl: 3 .  timolol (BETIMOL) 0.5 % ophthalmic solution, Place 1 drop into both eyes 2 (two) times daily., Disp: , Rfl:   Past Medical History: Past Medical History:  Diagnosis Date  . [name]    a. LHC 02/03/17 for unstable angina showed severe 3v CAD w/ sequential 70% & 80% p-mLAD, 99% mLCx, CTO mRCA, elevated LV filling pressure  . Colon polyp 2010  . Diabetes mellitus with complication ([place])   . Glaucoma   .  Hypertension     Tobacco Use: Social History   Tobacco Use  Smoking Status Never Smoker  Smokeless Tobacco Never Used    Labs: Recent Review Flowsheet Data    Labs for ITP Cardiac and Pulmonary Rehab Latest Ref Rng & Units 03/01/2017 03/02/2017 03/03/2017 03/04/2017 03/05/2017   Cholestrol 0 - 200 mg/dL - - - - -   LDLCALC 0 - 99 mg/dL - - - - -   HDL >40 mg/dL - - - - -   Trlycerides <150 mg/dL - - - - -   Hemoglobin A1c 4.8 - 5.6 % - - - - -   PHART 7.350 - 7.450 - - - - -   PCO2ART 32.0 - 48.0 mmHg - - - - -   HCO3 20.0 - 28.0 mmol/L - - - - -   TCO2 0 - 100 mmol/L - - - - -   ACIDBASEDEF 0.0 - 2.0 mmol/L - - - - -   O2SAT % 69.5 68.3 58.5 61.0 74.5       Exercise Target Goals:    Exercise Program Goal: Individual exercise prescription set with THRR, safety & activity barriers. Participant demonstrates ability to understand and report RPE using BORG scale, to self-measure pulse accurately, and to acknowledge the importance of the exercise prescription.  Exercise Prescription Goal: Starting with aerobic activity 30 plus minutes a day, 3 days per week for initial exercise prescription. Provide home exercise prescription and guidelines that participant acknowledges understanding prior to discharge.  Activity Barriers & Risk Stratification: Activity Barriers & Cardiac Risk Stratification - 09/17/17 1426      Activity Barriers & Cardiac Risk Stratification   Activity Barriers  Back Problems;Balance Concerns;Assistive Device;Other (comment)    Comments  She has back pain and knee pain but is not being treated for it. It causes her to have balance issues but she uses a cane to help.     Cardiac Risk Stratification  High       6 Minute Walk: 6 Minute Walk    Row Name 09/17/17 1429         6 Minute Walk   Phase  Initial     Distance  725 feet     Walk Time  6 minutes     # of Rest Breaks  0     MPH  1.37     METS  1.76     RPE  11     VO2 Peak  6.18     Symptoms   No     Resting HR  82 bpm     Resting BP  136/58     Exercise Oxygen Saturation  during 6 min walk  100 %     Max Ex. HR  116 bpm     Max Ex. BP  134/60     2 Minute Post BP  134/72        Oxygen Initial Assessment: Oxygen Initial Assessment - 10/01/17 1509      Home Oxygen   Home Oxygen Device  None    Sleep Oxygen Prescription  None    Home Exercise Oxygen Prescription  None    Home at Rest Exercise Oxygen Prescription  None      Initial 6 min Walk   Oxygen Used  None      Program Oxygen Prescription   Program Oxygen Prescription  None       Oxygen Re-Evaluation:   Oxygen Discharge (Final Oxygen Re-Evaluation):   Initial Exercise Prescription: Initial Exercise Prescription - 09/17/17 1400      Date of Initial Exercise RX and Referring Provider   Date  09/17/17    Referring Provider  Bensimhon      Treadmill   MPH  1    Grade  0    Minutes  15    METs  1.77      NuStep   Level  1    SPM  80    Minutes  15    METs  1.7      Biostep-RELP   Level  1    SPM  50    Minutes  15    METs  1.7      Track   Laps  15    Minutes  15    METs  1.7      Prescription Details   Frequency (times per week)  3    Duration  Progress to 45 minutes of aerobic exercise without signs/symptoms of physical distress      Intensity   THRR 40-80% of Max Heartrate  105-128    Ratings of Perceived Exertion  11-13    Perceived Dyspnea  0-4      Resistance Training   Training Prescription  Yes    Weight  2 lb    Reps  10-15       Perform Capillary Blood Glucose checks as needed.  Exercise Prescription Changes: Exercise Prescription Changes    Row Name 09/17/17 1400 09/25/17 1300 10/07/17 1200 10/23/17 0900 10/27/17 0900     Response to Exercise   Blood Pressure (Admit)  136/58  122/60  130/64  132/60  -   Blood Pressure (Exercise)  134/60  122/56  122/64  142/70  -   Blood Pressure (Exit)  134/72  120/60  124/70  124/78  -   Heart Rate (Admit)  81 bpm  72 bpm   56 bpm  67 bpm  -   Heart Rate (Exercise)  116 bpm  92 bpm  94 bpm  94 bpm  -   Heart Rate (Exit)  80 bpm  70 bpm  54 bpm  66 bpm  -   Oxygen Saturation (Exit)  100 %  -  -  -  -   Rating of Perceived Exertion (Exercise)  '11  15  13  12  ' -   Symptoms  -  none  none  none  -   Comments  -  second full day of exercise  -  -  -   Duration  -  Progress to 45 minutes of aerobic exercise without signs/symptoms of physical distress  Progress to 45 minutes of aerobic exercise without signs/symptoms of physical distress  Progress to 45 minutes of aerobic exercise without signs/symptoms of physical distress  -   Intensity  -  THRR unchanged  THRR unchanged  THRR unchanged  -     Progression   Progression  -  Continue to progress workloads to maintain intensity without signs/symptoms of physical distress.  Continue to progress workloads to maintain intensity without signs/symptoms of physical distress.  Continue to progress workloads to maintain intensity without signs/symptoms of physical distress.  -   Average METs  -  2.4  2.2  1.98  -     Resistance Training   Training Prescription  -  Yes  Yes  Yes  -   Weight  -  2 lb  2 lb  2 lb  -   Reps  -  10-15  10-15  10-15  -     Interval Training   Interval Training  -  No  No  No  -     Treadmill   MPH  -  -  1.5  1.5  -   Grade  -  -  0  0  -   Minutes  -  -  15  15  -   METs  -  -  2.15  2.15  -     NuStep   Level  -  '1  1  1  ' -   SPM  -  -  80  -  -   Minutes  -  '15  15  15  ' -   METs  -  2.8  2.2  1.8  -     Biostep-RELP   Level  -  1  -  1  -   Minutes  -  15  -  15  -   METs  -  2  -  2  -     Track   Laps  -  7  -  -  -   Minutes  -  10  -  -  -  Home Exercise Plan   Plans to continue exercise at  -  -  -  -  Home (comment) walking   Frequency  -  -  -  -  Add 2 additional days to program exercise sessions.   Initial Home Exercises Provided  -  -  -  -  10/27/17   Row Name 11/04/17 1500 11/17/17 1500           Response  to Exercise   Blood Pressure (Admit)  132/74  124/70      Blood Pressure (Exercise)  120/58  112/58      Blood Pressure (Exit)  98/58  122/56      Heart Rate (Admit)  64 bpm  66 bpm      Heart Rate (Exercise)  79 bpm  79 bpm      Heart Rate (Exit)  60 bpm  55 bpm      Rating of Perceived Exertion (Exercise)  12  12      Symptoms  none  none      Duration  Progress to 45 minutes of aerobic exercise without signs/symptoms of physical distress  Continue with 45 min of aerobic exercise without signs/symptoms of physical distress.      Intensity  THRR unchanged  THRR unchanged        Progression   Progression  Continue to progress workloads to maintain intensity without signs/symptoms of physical distress.  Continue to progress workloads to maintain intensity without signs/symptoms of physical distress.      Average METs  2.28  2.22        Resistance Training   Training Prescription  Yes  Yes      Weight  2 lb  2 lb      Reps  10-15  10-15        Interval Training   Interval Training  No  No        Treadmill   MPH  1.5  1.5      Grade  0  0      Minutes  15  15      METs  2.15  2.15        NuStep   Level  3  3      Minutes  15  15      METs  2.7  2.5        Biostep-RELP   Level  1  1      Minutes  15  15      METs  2  2        Home Exercise Plan   Plans to continue exercise at  Home (comment) walking  Home (comment) walking      Frequency  Add 2 additional days to program exercise sessions.  Add 2 additional days to program exercise sessions.      Initial Home Exercises Provided  10/27/17  10/27/17         Exercise Comments: Exercise Comments    Row Name 09/22/17 6720 09/29/17 0856         Exercise Comments  First full day of exercise!  Patient was oriented to gym and equipment including functions, settings, policies, and procedures.  Patient's individual exercise prescription and treatment plan were reviewed.  All starting workloads were established based on the  results of the 6 minute walk test done at initial orientation visit.  The plan for exercise progression was also introduced and progression will be  customized based on patient's performance and goals.  Jillyn increased to 1.5 mph on TM today,         Exercise Goals and Review: Exercise Goals    Row Name 09/17/17 1427             Exercise Goals   Increase Physical Activity  Yes       Intervention  Provide advice, education, support and counseling about physical activity/exercise needs.;Develop an individualized exercise prescription for aerobic and resistive training based on initial evaluation findings, risk stratification, comorbidities and participant's personal goals.       Expected Outcomes  Achievement of increased cardiorespiratory fitness and enhanced flexibility, muscular endurance and strength shown through measurements of functional capacity and personal statement of participant.       Increase Strength and Stamina  Yes       Intervention  Provide advice, education, support and counseling about physical activity/exercise needs.;Develop an individualized exercise prescription for aerobic and resistive training based on initial evaluation findings, risk stratification, comorbidities and participant's personal goals.       Expected Outcomes  Achievement of increased cardiorespiratory fitness and enhanced flexibility, muscular endurance and strength shown through measurements of functional capacity and personal statement of participant.       Able to understand and use rate of perceived exertion (RPE) scale  Yes       Intervention  Provide education and explanation on how to use RPE scale       Expected Outcomes  Short Term: Able to use RPE daily in rehab to express subjective intensity level;Long Term:  Able to use RPE to guide intensity level when exercising independently       Able to understand and use Dyspnea scale  Yes       Intervention  Provide education and explanation on how to  use Dyspnea scale       Expected Outcomes  Short Term: Able to use Dyspnea scale daily in rehab to express subjective sense of shortness of breath during exertion;Long Term: Able to use Dyspnea scale to guide intensity level when exercising independently       Knowledge and understanding of Target Heart Rate Range (THRR)  Yes       Intervention  Provide education and explanation of THRR including how the numbers were predicted and where they are located for reference       Expected Outcomes  Short Term: Able to state/look up THRR;Long Term: Able to use THRR to govern intensity when exercising independently;Short Term: Able to use daily as guideline for intensity in rehab       Able to check pulse independently  Yes       Intervention  Provide education and demonstration on how to check pulse in carotid and radial arteries.;Review the importance of being able to check your own pulse for safety during independent exercise       Expected Outcomes  Short Term: Able to explain why pulse checking is important during independent exercise;Long Term: Able to check pulse independently and accurately       Understanding of Exercise Prescription  Yes       Intervention  Provide education, explanation, and written materials on patient's individual exercise prescription       Expected Outcomes  Short Term: Able to explain program exercise prescription;Long Term: Able to explain home exercise prescription to exercise independently          Exercise Goals Re-Evaluation : Exercise Goals Re-Evaluation  [place] Name 09/22/17 5916 09/25/17 1348 10/07/17 1252 10/22/17 1037 10/27/17 0946     Exercise Goal Re-Evaluation   Exercise Goals Review  Increase Physical Activity;Increase Strength and Stamina;Able to understand and use rate of perceived exertion (RPE) scale  Increase Physical Activity;Increase Strength and Stamina  Increase Physical Activity;Increase Strength and Stamina  Increase Physical Activity;Increase  Strength and Stamina  Increase Physical Activity;Understanding of Exercise Prescription;Increase Strength and Stamina;Knowledge and understanding of Target Heart Rate Range (THRR);Able to understand and use rate of perceived exertion (RPE) scale;Able to check pulse independently   Comments  Reviewed RPE scale, THR and program prescription with pt today.  Pt voiced understanding and was given a copy of goals to take home.   Diella is off to a good start in rehab.  She has completed two full days of exercise.  She did not do well on the treadmil as it made her feel dizzy so we moved her to the track.  We will continue to monitor her progression.   Olar has increased her speed on TM and is tolerating exercise well. Staff will continue to monitor progress.  Denay has been doing well in rehab.  She is starting to feel like she is gaining more strength and stamina.   She does walk a little bit at home, to and from her mailbox, but gets short of breath.  We will continue to work with her on the breathing.  Reviewed home exercise with pt today.  Pt plans to walk at home for exercise.  Reviewed THR, pulse, RPE, sign and symptoms, and when to call 911 or MD.  Also discussed weather considerations and indoor options.  Pt voiced understanding.   Expected Outcomes  Short: Use RPE daily to regulate intensity.  Long: Follow program prescription in THR.  Short:  Continue to attend classes regularly.  Long: Follow program prescription for exercise to increase strength and stamina.   Short - Zayra will continue to attend and will add in exercise at home.  Long - Adrean will exercise 3-5 days per week.  Short: Review home exercise guidelines.  Long: Increase home exercise.   Short: Add in two days at home consistently  Long: Start counting pulse some during exercise   Row Name 11/04/17 1512 11/10/17 0932 11/17/17 1546         Exercise Goal Re-Evaluation   Exercise Goals Review  Increase Physical Activity;Understanding of  Exercise Prescription;Increase Strength and Stamina  Increase Physical Activity;Understanding of Exercise Prescription;Increase Strength and Stamina  Increase Physical Activity;Understanding of Exercise Prescription;Increase Strength and Stamina     Comments  Rosmary has been doing well in rehab.  She is feeling better now that some of her fluid has started to come off.   She is finally ready to start moving up her workloads and she moved up to level 3 on the NuStep.  We will continue to monitor her progression.   Yen continues to do well in rehab.  She has been walking some at home.  She is also starting to feel a little stronger. We will continue to work with her on building more stamina.   Desree has been doing well in rehab.  She is now up to 2.6 METs on the NuStep.  We will continue to work on her progression.      Expected Outcomes  Short: Continue to increase workloads.  Long: Add in more exercise at home on off days.   Short: Continue to attend rehab to increase stamina.  Long: Continue to exercise at home.  Short: Try to increase her workloads.  Long: Continue to walk at home.         Discharge Exercise Prescription (Final Exercise Prescription Changes): Exercise Prescription Changes - 11/17/17 1500      Response to Exercise   Blood Pressure (Admit)  124/70    Blood Pressure (Exercise)  112/58    Blood Pressure (Exit)  122/56    Heart Rate (Admit)  66 bpm    Heart Rate (Exercise)  79 bpm    Heart Rate (Exit)  55 bpm    Rating of Perceived Exertion (Exercise)  12    Symptoms  none    Duration  Continue with 45 min of aerobic exercise without signs/symptoms of physical distress.    Intensity  THRR unchanged      Progression   Progression  Continue to progress workloads to maintain intensity without signs/symptoms of physical distress.    Average METs  2.22      Resistance Training   Training Prescription  Yes    Weight  2 lb    Reps  10-15      Interval Training   Interval  Training  No      Treadmill   MPH  1.5    Grade  0    Minutes  15    METs  2.15      NuStep   Level  3    Minutes  15    METs  2.5      Biostep-RELP   Level  1    Minutes  15    METs  2      Home Exercise Plan   Plans to continue exercise at  Home (comment) walking    Frequency  Add 2 additional days to program exercise sessions.    Initial Home Exercises Provided  10/27/17       Nutrition:  Target Goals: Understanding of nutrition guidelines, daily intake of sodium <1576m, cholesterol <2036m calories 30% from fat and 7% or less from saturated fats, daily to have 5 or more servings of fruits and vegetables.  Biometrics: Pre Biometrics - 09/17/17 1427      Pre Biometrics   Height  '5\' 5"'  (1.651 m)    Weight  135 lb 8 oz (61.5 kg)    Waist Circumference  29 inches    Hip Circumference  38 inches    Waist to Hip Ratio  0.76 %    BMI (Calculated)  22.55        Nutrition Therapy Plan and Nutrition Goals: Nutrition Therapy & Goals - 10/01/17 1509      Nutrition Therapy   RD appointment defered  Yes not now       Nutrition Discharge: Rate Your Plate Scores: Nutrition Assessments - 09/22/17 1143      MEDFICTS Scores   Pre Score  50       Nutrition Goals Re-Evaluation: Nutrition Goals Re-Evaluation    RoTanainaame 10/22/17 1057 11/10/17 0920           Goals   Nutrition Goal  Meet with dietician  Keep following her current diet.      Comment  Appt scheduled for 11/6 after class  She missed her appointment with dietician as she did not feel good that day.  She does not want to reschedule and would like to just continue on current diet.  We talked about making sure that she is eating enough  and drinking enough.      Expected Outcome  Short: Meet with dietician to help with heart healthy diet.  Long: Follow recommendations  Short: Continue to follow heart and renal diet.  Long: Continue to eat enough to maintain weight.         Nutrition Goals Discharge (Final  Nutrition Goals Re-Evaluation): Nutrition Goals Re-Evaluation - 11/10/17 0920      Goals   Nutrition Goal  Keep following her current diet.    Comment  She missed her appointment with dietician as she did not feel good that day.  She does not want to reschedule and would like to just continue on current diet.  We talked about making sure that she is eating enough and drinking enough.    Expected Outcome  Short: Continue to follow heart and renal diet.  Long: Continue to eat enough to maintain weight.       Psychosocial: Target Goals: Acknowledge presence or absence of significant depression and/or stress, maximize coping skills, provide positive support system. Participant is able to verbalize types and ability to use techniques and skills needed for reducing stress and depression.   Initial Review & Psychosocial Screening: Initial Psych Review & Screening - 09/17/17 1405      Initial Review   Current issues with  Current Stress Concerns    Source of Stress Concerns  Chronic Illness;Transportation;Unable to participate in former interests or hobbies    Comments  Her husband has dementia and since her event and surgery he has been moved to [facility] facility which has taken a lot of stress off of her due to not being his sole caregiver. She still misses having him in the home but visits him almost daily. She lives alone and drives herself so that can be a stressor at times due to having intraoccular lens implants. She would like to attend church regularly again but feels she does not want to drive more than she has to and gets too fatigued once she is there. Once she gets some stamina back we discussed having her find a ride to church.      Family Dynamics   Good Support System?  Yes    Comments  Her husband has dementia and is [place], her daughter lives in [place] but will come visit regularly and stay for longer visits to help at times.       Barriers   Psychosocial barriers to  participate in program  The patient should benefit from training in stress management and relaxation.      Screening Interventions   Interventions  Yes;Encouraged to exercise;Program counselor consult;Provide feedback about the scores to participant    Expected Outcomes  Short Term goal: Utilizing psychosocial counselor, staff and physician to assist with identification of specific Stressors or current issues interfering with healing process. Setting desired goal for each stressor or current issue identified.;Long Term Goal: Stressors or current issues are controlled or eliminated.;Short Term goal: Identification and review with participant of any Quality of Life or Depression concerns found by scoring the questionnaire.;Long Term goal: The participant improves quality of Life and PHQ9 Scores as seen by post scores and/or verbalization of changes       Quality of Life Scores:  Quality of Life - 09/17/17 1412      Quality of Life Scores   Health/Function Pre  17.1 %    Socioeconomic Pre  21.92 %    Psych/Spiritual Pre  22.67 %    Family Pre  24.88 %    GLOBAL Pre  20.11 %       PHQ-9: Recent Review Flowsheet Data    Depression screen [facility] 2/9 09/17/2017   Decreased Interest 0   Down, Depressed, Hopeless 0   PHQ - 2 Score 0   Altered sleeping 0   Tired, decreased energy 1   Change in appetite 0   Feeling bad or failure about yourself  0   Trouble concentrating 1   Moving slowly or fidgety/restless 1   Suicidal thoughts 0   PHQ-9 Score 3   Difficult doing work/chores Not difficult at all     Interpretation of Total Score  Total Score Depression Severity:  1-4 = Minimal depression, 5-9 = Mild depression, 10-14 = Moderate depression, 15-19 = Moderately severe depression, 20-27 = Severe depression   Psychosocial Evaluation and Intervention: Psychosocial Evaluation - 09/22/17 0933      Psychosocial Evaluation & Interventions   Interventions  Encouraged to exercise with the program  and follow exercise prescription    Comments  Counselor met with Ms. Minette Brine [facility]) today for initial psychosocial evaluation.  She is an 82 year old who had a CABGx4 earlier this year.  She has a strong support system with a daughter; a sister-in-law; neighbors; and active involvement in her local church community.  Tzipora is married but her spouse has severe dementia and lives in a care center.  Annalisse has diabetes in addition to her heart issues.  She sleeps well and has a good appetite.  Ryelee denies a history of depression or anxiety or any current symptoms and reports typically being in a positive mood.  She does have some stress in her life with her own health and that of her spouse.  Decklyn has goals to walk better and increase her stamina while in this program.  She will be followed by staff.     Expected Outcomes  [place] will benefit from consistent exercise to achieve her stated goals.  The educational and psychoeducational components of this program will be helpful in understanding and coping more positively with her heart condition.  Staff will follow.    Continue Psychosocial Services   Follow up required by staff       Psychosocial Re-Evaluation: Psychosocial Re-Evaluation    Row Name 10/22/17 1058 11/10/17 0917           Psychosocial Re-Evaluation   Current issues with  Current Stress Concerns  Current Stress Concerns      Comments  Aamiyah has been doing well in rehab.  She continues to stress over her healthy issues, but overall has remained postive.  She contineus to sleep well and denies symptoms of depression.   Evann has been doing well in rehab.  She is feeling better after getting her fluid off.  She continues to feel good and staying postivie.  She still has not been back to church afraid of falling.  We talked about getting back to church and using an usher's arm to get to her seat.  She contiues to sleep good..      Expected Outcomes  Short: Continue to attend class.   Long: Remain postive and cope with health issues.   Short: Get back to church.  Long: Remain postivie and cope.      Interventions  -  Encouraged to attend Cardiac Rehabilitation for the exercise      Comments  -  Afraid of falling when she weak.  Initial Review   Source of Stress Concerns  Chronic Illness  Chronic Illness;Unable to participate in former interests or hobbies         Psychosocial Discharge (Final Psychosocial Re-Evaluation): Psychosocial Re-Evaluation - 11/10/17 0917      Psychosocial Re-Evaluation   Current issues with  Current Stress Concerns    Comments  Kenidy has been doing well in rehab.  She is feeling better after getting her fluid off.  She continues to feel good and staying postivie.  She still has not been back to church afraid of falling.  We talked about getting back to church and using an usher's arm to get to her seat.  She contiues to sleep good..    Expected Outcomes  Short: Get back to church.  Long: Remain postivie and cope.    Interventions  Encouraged to attend Cardiac Rehabilitation for the exercise    Comments  Afraid of falling when she weak.      Initial Review   Source of Stress Concerns  Chronic Illness;Unable to participate in former interests or hobbies       Vocational Rehabilitation: Provide vocational rehab assistance to qualifying candidates.   Vocational Rehab Evaluation & Intervention: Vocational Rehab - 09/17/17 1423      Initial Vocational Rehab Evaluation & Intervention   Assessment shows need for Vocational Rehabilitation  No       Education: Education Goals: Education classes will be provided on a variety of topics geared toward better understanding of heart health and risk factor modification. Participant will state understanding/return demonstration of topics presented as noted by education test scores.  Learning Barriers/Preferences: Learning Barriers/Preferences - 09/17/17 1421      Learning  Barriers/Preferences   Learning Barriers  Sight;Exercise Concerns Has bilateral intraoccular lens implants, has appointment with optometrist tomorrow.  Does not walk well so has concerns about exercise and being able to do it.     Learning Preferences  None       Education Topics: General Nutrition Guidelines/Fats and Fiber: -Group instruction provided by verbal, written material, models and posters to present the general guidelines for heart healthy nutrition. Gives an explanation and review of dietary fats and fiber.   Cardiac Rehab from 11/24/2017 in [facility] Cardiac and Pulmonary Rehab  Date  10/06/17  Educator  PI  Instruction Review Code  1- Verbalizes Understanding      Controlling Sodium/Reading Food Labels: -Group verbal and written material supporting the discussion of sodium use in heart healthy nutrition. Review and explanation with models, verbal and written materials for utilization of the food label.   Cardiac Rehab from 11/24/2017 in [facility] Cardiac and Pulmonary Rehab  Date  10/13/17  Educator  CR  Instruction Review Code  1- Verbalizes Understanding      Exercise Physiology & Risk Factors: - Group verbal and written instruction with models to review the exercise physiology of the cardiovascular system and associated critical values. Details cardiovascular disease risk factors and the goals associated with each risk factor.   Aerobic Exercise & Resistance Training: - Gives group verbal and written discussion on the health impact of inactivity. On the components of aerobic and resistive training programs and the benefits of this training and how to safely progress through these programs.   Cardiac Rehab from 11/24/2017 in [facility] Cardiac and Pulmonary Rehab  Date  10/22/17  Educator  [facility]  Instruction Review Code  1- Verbalizes Understanding      Flexibility, Balance, General Exercise Guidelines: - Provides group  verbal and written instruction on the benefits of flexibility  and balance training programs. Provides general exercise guidelines with specific guidelines to those with heart or lung disease. Demonstration and skill practice provided.   Cardiac Rehab from 11/24/2017 in [facility] Cardiac and Pulmonary Rehab  Date  10/27/17  Educator  AS  Instruction Review Code  1- Verbalizes Understanding      Stress Management: - Provides group verbal and written instruction about the health risks of elevated stress, cause of high stress, and healthy ways to reduce stress.   Cardiac Rehab from 11/24/2017 in [facility] Cardiac and Pulmonary Rehab  Date  11/03/17  Educator  [facility]  Instruction Review Code  1- Verbalizes Understanding      Depression: - Provides group verbal and written instruction on the correlation between heart/lung disease and depressed mood, treatment options, and the stigmas associated with seeking treatment.   Cardiac Rehab from 11/24/2017 in [facility] Cardiac and Pulmonary Rehab  Date  09/29/17  Educator  [facility]  Instruction Review Code  1- Verbalizes Understanding      Anatomy & Physiology of the Heart: - Group verbal and written instruction and models provide basic cardiac anatomy and physiology, with the coronary electrical and arterial systems. Review of: AMI, Angina, Valve disease, Heart Failure, Cardiac Arrhythmia, Pacemakers, and the ICD.   Cardiac Procedures: - Group verbal and written instruction to review commonly prescribed medications for heart disease. Reviews the medication, class of the drug, and side effects. Includes the steps to properly store meds and maintain the prescription regimen. (beta blockers and nitrates)   Cardiac Rehab from 11/24/2017 in [facility] Cardiac and Pulmonary Rehab  Date  11/10/17  Educator  SB  Instruction Review Code  1- Verbalizes Understanding      Cardiac Medications I: - Group verbal and written instruction to review commonly prescribed medications for heart disease. Reviews the medication, class of the drug, and side  effects. Includes the steps to properly store meds and maintain the prescription regimen.   Cardiac Rehab from 11/24/2017 in [facility] Cardiac and Pulmonary Rehab  Date  11/17/17  Educator  KS  Instruction Review Code  1- Verbalizes Understanding      Cardiac Medications II: -Group verbal and written instruction to review commonly prescribed medications for heart disease. Reviews the medication, class of the drug, and side effects. (all other drug classes)    Go Sex-Intimacy & Heart Disease, Get SMART - Goal Setting: - Group verbal and written instruction through game format to discuss heart disease and the return to sexual intimacy. Provides group verbal and written material to discuss and apply goal setting through the application of the S.M.A.R.T. Method.   Cardiac Rehab from 11/24/2017 in [facility] Cardiac and Pulmonary Rehab  Date  11/10/17  Educator  SB  Instruction Review Code  1- Verbalizes Understanding      Other Matters of the Heart: - Provides group verbal, written materials and models to describe Heart Failure, Angina, Valve Disease, Peripheral Artery Disease, and Diabetes in the realm of heart disease. Includes description of the disease process and treatment options available to the cardiac patient.   Exercise & Equipment Safety: - Individual verbal instruction and demonstration of equipment use and safety with use of the equipment.   Cardiac Rehab from 11/24/2017 in [facility] Cardiac and Pulmonary Rehab  Date  09/17/17  Educator  KS  Instruction Review Code  1- Verbalizes Understanding      Infection Prevention: - Provides verbal and written material to individual with  discussion of infection control including proper hand washing and proper equipment cleaning during exercise session.   Cardiac Rehab from 11/24/2017 in [facility] Cardiac and Pulmonary Rehab  Date  09/17/17  Educator  KS  Instruction Review Code  1- Verbalizes Understanding      Falls Prevention: - Provides verbal  and written material to individual with discussion of falls prevention and safety.   Cardiac Rehab from 11/24/2017 in [facility] Cardiac and Pulmonary Rehab  Date  09/17/17  Educator  KS  Instruction Review Code  1- Verbalizes Understanding      Diabetes: - Individual verbal and written instruction to review signs/symptoms of diabetes, desired ranges of glucose level fasting, after meals and with exercise. Acknowledge that pre and post exercise glucose checks will be done for 3 sessions at entry of program.   Cardiac Rehab from 11/24/2017 in [facility] Cardiac and Pulmonary Rehab  Date  09/17/17  Educator  KS  Instruction Review Code  1- Verbalizes Understanding      Other: -Provides group and verbal instruction on various topics (see comments)   Cardiac Rehab from 11/24/2017 in [facility] Cardiac and Pulmonary Rehab  Date  10/29/17 [know your numbers and risk factors]  Educator  [facility]  Instruction Review Code  1- Verbalizes Understanding       Knowledge Questionnaire Score: Knowledge Questionnaire Score - 09/17/17 1423      Knowledge Questionnaire Score   Pre Score  23/28 Answers reviewed with patient who verbalized understanding.        Core Components/Risk Factors/Patient Goals at Admission: Personal Goals and Risk Factors at Admission - 09/17/17 1358      Core Components/Risk Factors/Patient Goals on Admission    Weight Management  Weight Maintenance;Yes    Intervention  Weight Management: Provide education and appropriate resources to help participant work on and attain dietary goals.;Weight Management: Develop a combined nutrition and exercise program designed to reach desired caloric intake, while maintaining appropriate intake of nutrient and fiber, sodium and fats, and appropriate energy expenditure required for the weight goal.    Admit Weight  135 lb 8 oz (61.5 kg)    Expected Outcomes  Understanding recommendations for meals to include 15-35% energy as protein, 25-35% energy from fat,  35-60% energy from carbohydrates, less than 248m of dietary cholesterol, 20-35 gm of total fiber daily;Understanding of distribution of calorie intake throughout the day with the consumption of 4-5 meals/snacks;Weight Maintenance: Understanding of the daily nutrition guidelines, which includes 25-35% calories from fat, 7% or less cal from saturated fats, less than 201mcholesterol, less than 1.5gm of sodium, & 5 or more servings of fruits and vegetables daily    Diabetes  Yes    Intervention  Provide education about signs/symptoms and action to take for hypo/hyperglycemia.;Provide education about proper nutrition, including hydration, and aerobic/resistive exercise prescription along with prescribed medications to achieve blood glucose in normal ranges: Fasting glucose 65-99 mg/dL    Expected Outcomes  Short Term: Participant verbalizes understanding of the signs/symptoms and immediate care of hyper/hypoglycemia, proper foot care and importance of medication, aerobic/resistive exercise and nutrition plan for blood glucose control.;Long Term: Attainment of HbA1C < 7%.    Heart Failure  Yes    Intervention  Provide a combined exercise and nutrition program that is supplemented with education, support and counseling about heart failure. Directed toward relieving symptoms such as shortness of breath, decreased exercise tolerance, and extremity edema.    Expected Outcomes  Improve functional capacity of life;Short term: Attendance in program  2-3 days a week with increased exercise capacity. Reported lower sodium intake. Reported increased fruit and vegetable intake. Reports medication compliance.;Short term: Daily weights obtained and reported for increase. Utilizing diuretic protocols set by physician.;Long term: Adoption of self-care skills and reduction of barriers for early signs and symptoms recognition and intervention leading to self-care maintenance.    Hypertension  Yes    Intervention  Provide  education on lifestyle modifcations including regular physical activity/exercise, weight management, moderate sodium restriction and increased consumption of fresh fruit, vegetables, and low fat dairy, alcohol moderation, and smoking cessation.;Monitor prescription use compliance.    Expected Outcomes  Short Term: Continued assessment and intervention until BP is < 140/73m HG in hypertensive participants. < 130/876mHG in hypertensive participants with diabetes, heart failure or chronic kidney disease.;Long Term: Maintenance of blood pressure at goal levels.    Lipids  Yes    Intervention  Provide education and support for participant on nutrition & aerobic/resistive exercise along with prescribed medications to achieve LDL <7039mHDL >74m22m  Expected Outcomes  Short Term: Participant states understanding of desired cholesterol values and is compliant with medications prescribed. Participant is following exercise prescription and nutrition guidelines.;Long Term: Cholesterol controlled with medications as prescribed, with individualized exercise RX and with personalized nutrition plan. Value goals: LDL < 70mg79mL > 40 mg.    Personal Goal Other  Yes    Personal Goal  She would like to feel well enough to attend church regularly and not feet poorly or fatigued during.     Intervention  Patient will come to cardiac rehab and work on building stamina and strength.     Expected Outcomes  She will gain enough strength and stamina to attend church regularly.        Core Components/Risk Factors/Patient Goals Review:  Goals and Risk Factor Review    Row Name 10/22/17 1052 11/10/17 0902           Core Components/Risk Factors/Patient Goals Review   Personal Goals Review  Weight Management/Obesity;Hypertension;Lipids;Heart Failure;Diabetes  Weight Management/Obesity;Hypertension;Lipids;Heart Failure;Diabetes      Review  FanniAlajahbeen doing well in rehab.  She continues to have SOB and swelling with  her heart failure. She saw the doctor yesterday and they adjusted some of her medications and increased her lasix.  Her weight was thus down some this morning and her swelling starting to improve.  Her blood pressures and blood sugars have been doing well and she monitors them at home.   Chianne's weight was down 8 lbs today!!  She is starting to feel better after the weight has come off.  No other SOB or other swelling!  Blood pressures have been good and medications are working.  She continues to check blood pressure and sugars at home and are doing well.      Expected Outcomes  Short: Continue to work on swelling in ankles.  Long: Manage heart failure symptoms.   Short: Call doctor to establish plan for preventing fluid overload.  Long: Continue to manage heart failure symptoms.          Core Components/Risk Factors/Patient Goals at Discharge (Final Review):  Goals and Risk Factor Review - 11/10/17 0902      Core Components/Risk Factors/Patient Goals Review   Personal Goals Review  Weight Management/Obesity;Hypertension;Lipids;Heart Failure;Diabetes    Review  Mohini's weight was down 8 lbs today!!  She is starting to feel better after the weight has come off.  No other SOB or  other swelling!  Blood pressures have been good and medications are working.  She continues to check blood pressure and sugars at home and are doing well.    Expected Outcomes  Short: Call doctor to establish plan for preventing fluid overload.  Long: Continue to manage heart failure symptoms.        ITP Comments: ITP Comments    Row Name 09/17/17 1354 09/30/17 0657 10/20/17 1541 10/28/17 0655 11/25/17 0611   ITP Comments  Med review completed. Initial ITP created. Diagnosis information can be found in [facility] encouter dated 08/19/2017.  30 day Review. Continue with ITP unless directed changes per Medical Director Review.   Return call to pt from this morning. She has been out since last Tuesday with swelling in her ankles. She  has an appointment tomorrow with Dr. Haroldine Laws and I encouraged her to show him her ankles. I also encouraged her to return to rehab on Thursday.   30 day review. Continue with ITP unless directed changes per Medical Director review.   30 day review. Continue with ITP unless directed changes per Medical Director review.       Comments:

## 2017-11-30 ENCOUNTER — Ambulatory Visit
Admission: RE | Admit: 2017-11-30 | Discharge: 2017-11-30 | Disposition: A | Discharge status: Home / Self Care | Payer: Medicare Other | Source: Ambulatory Visit | Attending: Nephrology | Admitting: Nephrology

## 2017-12-03 ENCOUNTER — Ambulatory Visit
Admission: RE | Admit: 2017-12-03 | Discharge: 2017-12-03 | Disposition: A | Discharge status: Home / Self Care | Payer: Medicare Other | Source: Ambulatory Visit | Attending: Nephrology | Admitting: Nephrology

## 2017-12-03 DIAGNOSIS — J90 Pleural effusion, not elsewhere classified: Secondary | ICD-10-CM | POA: Insufficient documentation

## 2017-12-03 DIAGNOSIS — K5730 Diverticulosis of large intestine without perforation or abscess without bleeding: Secondary | ICD-10-CM | POA: Insufficient documentation

## 2017-12-03 DIAGNOSIS — N1330 Unspecified hydronephrosis: Secondary | ICD-10-CM | POA: Diagnosis present

## 2017-12-03 DIAGNOSIS — K8020 Calculus of gallbladder without cholecystitis without obstruction: Secondary | ICD-10-CM | POA: Insufficient documentation

## 2017-12-03 DIAGNOSIS — N202 Calculus of kidney with calculus of ureter: Secondary | ICD-10-CM | POA: Diagnosis not present

## 2017-12-03 DIAGNOSIS — I2510 Atherosclerotic heart disease of native coronary artery without angina pectoris: Secondary | ICD-10-CM | POA: Diagnosis not present

## 2017-12-03 DIAGNOSIS — I700 Atherosclerosis of aorta: Secondary | ICD-10-CM | POA: Insufficient documentation

## 2017-12-03 DIAGNOSIS — N281 Cyst of kidney, acquired: Secondary | ICD-10-CM | POA: Insufficient documentation

## 2017-12-03 DIAGNOSIS — I517 Cardiomegaly: Secondary | ICD-10-CM | POA: Diagnosis not present

## 2017-12-03 DIAGNOSIS — M48061 Spinal stenosis, lumbar region without neurogenic claudication: Secondary | ICD-10-CM | POA: Diagnosis not present

## 2017-12-04 ENCOUNTER — Encounter: Payer: Self-pay | Admitting: Urology

## 2017-12-04 ENCOUNTER — Ambulatory Visit (INDEPENDENT_AMBULATORY_CARE_PROVIDER_SITE_OTHER): Payer: Medicare Other | Admitting: Urology

## 2017-12-04 VITALS — BP 128/65 | HR 61 | Ht 65.0 in | Wt 136.8 lb

## 2017-12-04 DIAGNOSIS — N281 Cyst of kidney, acquired: Secondary | ICD-10-CM | POA: Diagnosis not present

## 2017-12-04 DIAGNOSIS — N200 Calculus of kidney: Secondary | ICD-10-CM

## 2017-12-04 DIAGNOSIS — I200 Unstable angina: Secondary | ICD-10-CM

## 2017-12-04 LAB — URINALYSIS, COMPLETE
Bilirubin, UA: NEGATIVE
Glucose, UA: NEGATIVE
Ketones, UA: NEGATIVE
Nitrite, UA: POSITIVE — AB
PH UA: 6 (ref 5.0–7.5)
Specific Gravity, UA: 1.025 (ref 1.005–1.030)
Urobilinogen, Ur: 4 mg/dL — ABNORMAL HIGH (ref 0.2–1.0)

## 2017-12-04 LAB — MICROSCOPIC EXAMINATION: RBC, UA: NONE SEEN /hpf (ref 0–?)

## 2017-12-04 MED ORDER — TAMSULOSIN HCL 0.4 MG PO CAPS: 0.4000 mg | ORAL_CAPSULE | Freq: Every day | ORAL | 0 refills | Status: DC

## 2017-12-04 NOTE — Progress Notes (Signed)
12/04/2017 11:56 AM   [name] [name] 1935/12/16 [id number]  Referring provider: Kirk Ruths, MD [facility] [place], [place] [address]  Chief Complaint  Patient presents with  . Hydronephrosis    HPI: The patient is an 82 year old female with multiple medical comorbidities including severe CAD status post CABG in February 2018 as well as diabetic nephropathy (Cr at baseline is 2.5 - 3) who presents today for evaluation of a left ureteral stone as well as renal cyst.  1.  Left 5 mm ureteral calculus This was seen on CT scan performed on 12/03/2017.  There is no appreciable hydronephrosis bilaterally.  It is visible on [facility] imaging.  Patient has no previous history of nephrolithiasis.  She also has small nonobstructing bilateral stones.  She is currently asymptomatic.  2.  Left renal cyst The patient has multiple cyst on her left kidney which was seen on noncontrast CT scan.  The largest is 6 cm.  It has mild septations.  She has not had contrasted studies due to her renal function.   PMH: Past Medical History:  Diagnosis Date  . [name]    a. LHC 02/03/17 for unstable angina showed severe 3v CAD w/ sequential 70% & 80% p-mLAD, 99% mLCx, CTO mRCA, elevated LV filling pressure  . Colon polyp 2010  . Diabetes mellitus with complication ([place])   . Glaucoma   . Hypertension     Surgical History: Past Surgical History:  Procedure Laterality Date  . APPENDECTOMY    . BREAST BIOPSY Right 1999  . COLONOSCOPY  2010  . CORONARY ARTERY BYPASS GRAFT N/A 02/05/2017   Procedure: CORONARY ARTERY BYPASS GRAFTING (CABG) x 4                                                                               LIMA-LAD SEQ SVG-DIAG-OM SVG-PD;  Surgeon: Gaye Pollack, MD;  Location: [facility];  Service: Open Heart Surgery;  Laterality: N/A;  . IABP INSERTION N/A 02/04/2017   Procedure: IABP Insertion;  Surgeon: Leonie Man, MD;  Location: [facility];  Service: Cardiovascular;  Laterality: N/A;  . LEFT HEART CATH AND CORONARY ANGIOGRAPHY Left 02/03/2017   Procedure: Left Heart Cath and Coronary Angiography;  Surgeon: Nelva Bush, MD;  Location: [facility];  Service: Cardiovascular;  Laterality: Left;  . MITRAL VALVE REPAIR N/A 02/05/2017   Procedure: MITRAL VALVE REPLACEMENT (MVR) WITH MAGNA MITRAL EASE PERICARDIAL BIOPROSTHESIS 25 MM;  Surgeon: Gaye Pollack, MD;  Location: [facility];  Service: Open Heart Surgery;  Laterality: N/A;  . PARATHYROIDECTOMY     1986  . RIGHT HEART CATH N/A 02/04/2017   Procedure: Right Heart Cath;  Surgeon: Leonie Man, MD;  Location: [facility];  Service: Cardiovascular;  Laterality: N/A;  . TEE WITHOUT CARDIOVERSION N/A 02/05/2017   Procedure: TRANSESOPHAGEAL ECHOCARDIOGRAM (TEE);  Surgeon: Gaye Pollack, MD;  Location: [facility];  Service: Open Heart Surgery;  Laterality: N/A;    Home Medications:  Allergies as of 12/04/2017   No Known Allergies     Medication List  Accurate as of 12/04/17 11:56 AM. Always use your most recent med list.          acetaminophen 500 MG tablet Commonly known as:  TYLENOL Take 500 mg by mouth every 6 (six) hours as needed for mild pain.   aspirin EC 81 MG tablet Take 1 tablet (81 mg total) by mouth daily.   carvedilol 3.125 MG tablet Commonly known as:  COREG TAKE 1 TABLET BY MOUTH TWICE DAILY   furosemide 40 MG tablet Commonly known as:  LASIX Take 40 mg 2 (two) times daily by mouth.   hydrALAZINE 50 MG tablet Commonly known as:  APRESOLINE Take 0.5 tablets (25 mg total) by mouth 2 (two) times daily. HOLD IF systolic BP <497   isosorbide mononitrate 30 MG 24 hr tablet Commonly known as:  IMDUR TAKE 1 TABLET BY MOUTH ONCE DAILY   latanoprost 0.005 % ophthalmic solution Commonly known as:  XALATAN Place 1 drop into both eyes at bedtime.   Potassium Chloride ER 20 MEQ Tbcr Take 20 mEq by mouth daily.     rosuvastatin 40 MG tablet Commonly known as:  CRESTOR Take 1 tablet (40 mg total) by mouth daily.   timolol 0.5 % ophthalmic solution Commonly known as:  BETIMOL Place 1 drop into both eyes 2 (two) times daily.       Allergies: No Known Allergies  Family History: Family History  Problem Relation Age of Onset  . Diabetes Father   . Diabetes Sister   . Bladder Cancer Neg Hx   . Kidney cancer Neg Hx   . Prostate cancer Neg Hx     Social History:  reports that  has never smoked. she has never used smokeless tobacco. She reports that she does not drink alcohol or use drugs.  ROS: UROLOGY Frequent Urination?: No Hard to postpone urination?: No Burning/pain with urination?: No Get up at night to urinate?: Yes Leakage of urine?: No Urine stream starts and stops?: No Trouble starting stream?: No Do you have to strain to urinate?: No Blood in urine?: No Urinary tract infection?: Yes Sexually transmitted disease?: No Injury to kidneys or bladder?: No Painful intercourse?: No Weak stream?: No Currently pregnant?: No Vaginal bleeding?: No Last menstrual period?: n  Gastrointestinal Nausea?: No Vomiting?: No Indigestion/heartburn?: No Diarrhea?: No Constipation?: No  Constitutional Fever: No Night sweats?: No Weight loss?: Yes Fatigue?: No  Skin Skin rash/lesions?: No Itching?: No  Eyes Blurred vision?: Yes Double vision?: No  Ears/Nose/Throat Sore throat?: No Sinus problems?: No  Hematologic/Lymphatic Swollen glands?: No Easy bruising?: No  Cardiovascular Leg swelling?: Yes Chest pain?: No  Respiratory Cough?: Yes Shortness of breath?: No  Endocrine Excessive thirst?: No  Musculoskeletal Back pain?: No Joint pain?: No  Neurological Headaches?: No Dizziness?: No  Psychologic Depression?: No Anxiety?: No  Physical Exam: BP 128/65 (BP Location: Right Arm, Patient Position: Sitting, Cuff Size: Normal)   Pulse 61   Ht 5\' 5"  (1.651 m)    Wt 136 lb 12.8 oz (62.1 kg)   BMI 22.76 kg/m   Constitutional:  Alert and oriented, No acute distress. HEENT: [place] AT, moist mucus membranes.  Trachea midline, no masses. Cardiovascular: No clubbing, cyanosis, or edema. Respiratory: Normal respiratory effort, no increased work of breathing. GI: Abdomen is soft, nontender, nondistended, no abdominal masses GU: No CVA tenderness.  Skin: No rashes, bruises or suspicious lesions. Lymph: No cervical or inguinal adenopathy. Neurologic: Grossly intact, no focal deficits, moving all 4 extremities. Psychiatric: Normal mood and affect.  Laboratory Data: Lab Results  Component Value Date   WBC 6.0 06/30/2017   HGB 10.7 (L) 06/30/2017   HCT 32.3 (L) 06/30/2017   MCV 78.6 (L) 06/30/2017   PLT 166 06/30/2017    Lab Results  Component Value Date   CREATININE 2.84 (H) 11/04/2017    No results found for: PSA  No results found for: TESTOSTERONE  Lab Results  Component Value Date   HGBA1C 6.3 (H) 02/03/2017    Urinalysis    Component Value Date/Time   COLORURINE YELLOW (A) 06/29/2017 0530   APPEARANCEUR CLEAR (A) 06/29/2017 0530   LABSPEC 1.005 06/29/2017 0530   PHURINE 6.0 06/29/2017 0530   GLUCOSEU NEGATIVE 06/29/2017 0530   HGBUR MODERATE (A) 06/29/2017 0530   BILIRUBINUR NEGATIVE 06/29/2017 0530   KETONESUR NEGATIVE 06/29/2017 0530   PROTEINUR NEGATIVE 06/29/2017 0530   NITRITE NEGATIVE 06/29/2017 0530   LEUKOCYTESUR TRACE (A) 06/29/2017 0530    Pertinent Imaging: CT renal ultrasound was above  Assessment & Plan:    1.  Left ureteral stone Discussed the patient that she does have a left ureteral stone.  It is not causing significant obstruction currently.  I have a suspicion that has been there for some time granted that she is asymptomatic.  We will start her on medical expulsive therapy with Flomax.  She was given a strainer as well.  Will check a KUB in 2 weeks to  reassess.  I would recommend surgical removal at some  point if she is unable to pass the stone as it may be contributing to her renal dysfunction.  2.  Left renal cyst with mild septations I discussed the patient that the cyst on her left kidney has mild septations.  Ideally, we would obtain a contrasted CT or MRI.  However, her renal function precludes this.  Given her age and multiple medical comorbidities, we will continue to monitor this with active surveillance.  We will repeat a renal ultrasound or CT scan without contrast in 3-6 months   Return in about 2 weeks (around 12/18/2017) for KUB prior.  Nickie Retort, MD  [facility] [address] [place], [place] [address] [phone]

## 2017-12-07 LAB — CULTURE, URINE COMPREHENSIVE

## 2017-12-08 ENCOUNTER — Encounter: Payer: Medicare Other | Admitting: *Deleted

## 2017-12-08 DIAGNOSIS — I5022 Chronic systolic (congestive) heart failure: Secondary | ICD-10-CM

## 2017-12-08 DIAGNOSIS — Z951 Presence of aortocoronary bypass graft: Secondary | ICD-10-CM

## 2017-12-08 NOTE — Progress Notes (Signed)
Daily Session Note  Patient Details  Name: CLEATUS GOODIN MRN: [id number] Date of Birth: [birth year] Referring Provider:     Cardiac Rehab from 09/17/2017 in [facility]  Referring Provider  Bensimhon      Encounter Date: 12/08/2017  Check In: Session Check In - 12/08/17 0833      Check-In   Location  [facility]    Staff Present  Nada Maclachlan, BA, ACSM CEP, Exercise Physiologist;Susanne Bice, RN, BSN, CCRP;Jessica Luan Pulling, MA, ACSM RCEP, Exercise Physiologist    Supervising physician immediately available to respond to emergencies  See telemetry face sheet for immediately available ER MD    Medication changes reported      No    Fall or balance concerns reported     No    Warm-up and Cool-down  Performed on first and last piece of equipment    Resistance Training Performed  Yes    VAD Patient?  No      Pain Assessment   Currently in Pain?  No/denies          Social History   Tobacco Use  Smoking Status Never Smoker  Smokeless Tobacco Never Used    Goals Met:  Independence with exercise equipment Exercise tolerated well Personal goals reviewed No report of cardiac concerns or symptoms Strength training completed today  Goals Unmet:  Not Applicable  Comments: Pt able to follow exercise prescription today without complaint.  Will continue to monitor for progression. See ITP for goal review.  Dr. Emily Filbert is Medical Director for [facility] and [facility].

## 2017-12-10 VITALS — Ht 65.0 in | Wt 141.0 lb

## 2017-12-10 DIAGNOSIS — I5022 Chronic systolic (congestive) heart failure: Secondary | ICD-10-CM | POA: Diagnosis not present

## 2017-12-10 DIAGNOSIS — Z951 Presence of aortocoronary bypass graft: Secondary | ICD-10-CM

## 2017-12-10 NOTE — Progress Notes (Signed)
Daily Session Note  Patient Details  Name: [name] [name] MRN: [id number] Date of Birth: 02/10/1935 Referring Provider:     Cardiac Rehab from 09/17/2017 in [facility]  Referring Provider  Bensimhon      Encounter Date: 12/10/2017  Check In: Session Check In - 12/10/17 0918      Check-In   Location  [facility]    Staff Present  Nada Maclachlan, BA, ACSM CEP, Exercise Physiologist;Carroll Enterkin, RN, Levie Heritage, MA, ACSM RCEP, Exercise Physiologist    Supervising physician immediately available to respond to emergencies  See telemetry face sheet for immediately available ER MD    Medication changes reported      No    Fall or balance concerns reported     No    Warm-up and Cool-down  Performed on first and last piece of equipment    Resistance Training Performed  Yes    VAD Patient?  No      Pain Assessment   Currently in Pain?  No/denies    Multiple Pain Sites  No          Social History   Tobacco Use  Smoking Status Never Smoker  Smokeless Tobacco Never Used    Goals Met:  Independence with exercise equipment Exercise tolerated well No report of cardiac concerns or symptoms Strength training completed today  Goals Unmet:  Not Applicable  Comments:  6 Minute Walk    Row Name 09/17/17 1429 12/10/17 0921       6 Minute Walk   Phase  Initial  Discharge    Distance  725 feet  1155 feet    Distance % Change  -  59 %    Distance Feet Change  -  430 ft    Walk Time  6 minutes  5.6 minutes    # of Rest Breaks  0  1    MPH  1.37  2.34    METS  1.76  2.2    RPE  11  15    Perceived Dyspnea   -  1    VO2 Peak  6.18  7.7    Symptoms  No  No    Resting HR  82 bpm  67 bpm    Resting BP  136/58  114/70    Resting Oxygen Saturation   -  97 %    Exercise Oxygen Saturation  during 6 min walk  100 %  99 %    Max Ex. HR  116 bpm  98 bpm    Max Ex. BP  134/60  122/54    2 Minute Post BP  134/72  -          Dr. Emily Filbert is Medical Director for [facility] and [facility].

## 2017-12-11 ENCOUNTER — Telehealth: Payer: Self-pay

## 2017-12-11 MED ORDER — SULFAMETHOXAZOLE-TRIMETHOPRIM 800-160 MG PO TABS: 1.0000 | ORAL_TABLET | Freq: Two times a day (BID) | ORAL | 0 refills | Status: AC

## 2017-12-11 NOTE — Telephone Encounter (Signed)
Nickie Retort, MD  Toniann Fail C, LPN        Please let patient know there was some bacteria growing in her urine. Can we please start her on 1 tab PO bactrim DS BID for a week. Thanks    [facility]- medication sent to pharmacy

## 2017-12-16 ENCOUNTER — Encounter (HOSPITAL_COMMUNITY): Payer: Medicare Other | Admitting: Internal Medicine

## 2017-12-17 ENCOUNTER — Ambulatory Visit: Payer: Medicare Other

## 2017-12-23 ENCOUNTER — Ambulatory Visit
Admission: RE | Admit: 2017-12-23 | Discharge: 2017-12-23 | Disposition: A | Discharge status: Home / Self Care | Payer: Medicare HMO | Source: Ambulatory Visit | Attending: Urology | Admitting: Urology

## 2017-12-23 ENCOUNTER — Other Ambulatory Visit: Payer: Self-pay | Admitting: Family Medicine

## 2017-12-23 ENCOUNTER — Encounter: Payer: Self-pay | Admitting: *Deleted

## 2017-12-23 DIAGNOSIS — N200 Calculus of kidney: Secondary | ICD-10-CM | POA: Diagnosis not present

## 2017-12-23 DIAGNOSIS — Z87442 Personal history of urinary calculi: Secondary | ICD-10-CM | POA: Diagnosis not present

## 2017-12-23 DIAGNOSIS — Z951 Presence of aortocoronary bypass graft: Secondary | ICD-10-CM

## 2017-12-23 NOTE — Progress Notes (Signed)
Cardiac Individual Treatment Plan  Patient Details  Name: [name] [name] MRN: [id number] Date of Birth: [birth year] Referring Provider:     Cardiac Rehab from 09/17/2017 in [facility] Cardiac and Pulmonary Rehab  Referring Provider  Bensimhon      Initial Encounter Date:    Cardiac Rehab from 09/17/2017 in [facility] Cardiac and Pulmonary Rehab  Date  09/17/17  Referring Provider  Bensimhon      Visit Diagnosis: [name]  Patient's Home Medications on Admission:  Current Outpatient Medications:  .  acetaminophen (TYLENOL) 500 MG tablet, Take 500 mg by mouth every 6 (six) hours as needed for mild pain. , Disp: , Rfl:  .  aspirin EC 81 MG tablet, Take 1 tablet (81 mg total) by mouth daily., Disp: 90 tablet, Rfl: 3 .  carvedilol (COREG) 3.125 MG tablet, TAKE 1 TABLET BY MOUTH TWICE DAILY, Disp: 180 tablet, Rfl: 3 .  furosemide (LASIX) 40 MG tablet, Take 40 mg 2 (two) times daily by mouth., Disp: , Rfl:  .  hydrALAZINE (APRESOLINE) 50 MG tablet, Take 0.5 tablets (25 mg total) by mouth 2 (two) times daily. HOLD IF systolic BP <594, Disp: 60 tablet, Rfl: 0 .  isosorbide mononitrate (IMDUR) 30 MG 24 hr tablet, TAKE 1 TABLET BY MOUTH ONCE DAILY, Disp: 90 tablet, Rfl: 3 .  latanoprost (XALATAN) 0.005 % ophthalmic solution, Place 1 drop into both eyes at bedtime. , Disp: , Rfl:  .  Potassium Chloride ER 20 MEQ TBCR, Take 20 mEq by mouth daily., Disp: 30 tablet, Rfl: 3 .  rosuvastatin (CRESTOR) 40 MG tablet, Take 1 tablet (40 mg total) by mouth daily., Disp: 90 tablet, Rfl: 3 .  tamsulosin (FLOMAX) 0.4 MG CAPS capsule, Take 1 capsule (0.4 mg total) by mouth daily., Disp: 30 capsule, Rfl: 0 .  timolol (BETIMOL) 0.5 % ophthalmic solution, Place 1 drop into both eyes 2 (two) times daily., Disp: , Rfl:   Past Medical History: Past Medical History:  Diagnosis Date  . [name]    a. LHC 02/03/17 for unstable angina showed severe 3v CAD w/ sequential 70% & 80% p-mLAD, 99% mLCx, CTO mRCA,  elevated LV filling pressure  . Colon polyp 2010  . Diabetes mellitus with complication ([place])   . Glaucoma   . Hypertension     Tobacco Use: Social History   Tobacco Use  Smoking Status Never Smoker  Smokeless Tobacco Never Used    Labs: Recent Review Flowsheet Data    Labs for ITP Cardiac and Pulmonary Rehab Latest Ref Rng & Units 03/01/2017 03/02/2017 03/03/2017 03/04/2017 03/05/2017   Cholestrol 0 - 200 mg/dL - - - - -   LDLCALC 0 - 99 mg/dL - - - - -   HDL >40 mg/dL - - - - -   Trlycerides <150 mg/dL - - - - -   Hemoglobin A1c 4.8 - 5.6 % - - - - -   PHART 7.350 - 7.450 - - - - -   PCO2ART 32.0 - 48.0 mmHg - - - - -   HCO3 20.0 - 28.0 mmol/L - - - - -   TCO2 0 - 100 mmol/L - - - - -   ACIDBASEDEF 0.0 - 2.0 mmol/L - - - - -   O2SAT % 69.5 68.3 58.5 61.0 74.5       Exercise Target Goals:    Exercise Program Goal: Individual exercise prescription set with THRR, safety & activity barriers. Participant demonstrates ability  to understand and report RPE using BORG scale, to self-measure pulse accurately, and to acknowledge the importance of the exercise prescription.  Exercise Prescription Goal: Starting with aerobic activity 30 plus minutes a day, 3 days per week for initial exercise prescription. Provide home exercise prescription and guidelines that participant acknowledges understanding prior to discharge.  Activity Barriers & Risk Stratification: Activity Barriers & Cardiac Risk Stratification - 09/17/17 1426      Activity Barriers & Cardiac Risk Stratification   Activity Barriers  Back Problems;Balance Concerns;Assistive Device;Other (comment)    Comments  She has back pain and knee pain but is not being treated for it. It causes her to have balance issues but she uses a cane to help.     Cardiac Risk Stratification  High       6 Minute Walk: 6 Minute Walk    Row Name 09/17/17 1429 12/10/17 0921       6 Minute Walk   Phase  Initial  Discharge    Distance  725  feet  1155 feet    Distance % Change  -  59 %    Distance Feet Change  -  430 ft    Walk Time  6 minutes  5.6 minutes    # of Rest Breaks  0  1    MPH  1.37  2.34    METS  1.76  2.2    RPE  11  15    Perceived Dyspnea   -  1    VO2 Peak  6.18  7.7    Symptoms  No  No    Resting HR  82 bpm  67 bpm    Resting BP  136/58  114/70    Resting Oxygen Saturation   -  97 %    Exercise Oxygen Saturation  during 6 min walk  100 %  99 %    Max Ex. HR  116 bpm  98 bpm    Max Ex. BP  134/60  122/54    2 Minute Post BP  134/72  -       Oxygen Initial Assessment: Oxygen Initial Assessment - 10/01/17 1509      Home Oxygen   Home Oxygen Device  None    Sleep Oxygen Prescription  None    Home Exercise Oxygen Prescription  None    Home at Rest Exercise Oxygen Prescription  None      Initial 6 min Walk   Oxygen Used  None      Program Oxygen Prescription   Program Oxygen Prescription  None       Oxygen Re-Evaluation:   Oxygen Discharge (Final Oxygen Re-Evaluation):   Initial Exercise Prescription: Initial Exercise Prescription - 09/17/17 1400      Date of Initial Exercise RX and Referring Provider   Date  09/17/17    Referring Provider  Bensimhon      Treadmill   MPH  1    Grade  0    Minutes  15    METs  1.77      NuStep   Level  1    SPM  80    Minutes  15    METs  1.7      Biostep-RELP   Level  1    SPM  50    Minutes  15    METs  1.7      Track   Laps  15    Minutes  15  METs  1.7      Prescription Details   Frequency (times per week)  3    Duration  Progress to 45 minutes of aerobic exercise without signs/symptoms of physical distress      Intensity   THRR 40-80% of Max Heartrate  105-128    Ratings of Perceived Exertion  11-13    Perceived Dyspnea  0-4      Resistance Training   Training Prescription  Yes    Weight  2 lb    Reps  10-15       Perform Capillary Blood Glucose checks as needed.  Exercise Prescription Changes: Exercise  Prescription Changes    Row Name 09/17/17 1400 09/25/17 1300 10/07/17 1200 10/23/17 0900 10/27/17 0900     Response to Exercise   Blood Pressure (Admit)  136/58  122/60  130/64  132/60  -   Blood Pressure (Exercise)  134/60  122/56  122/64  142/70  -   Blood Pressure (Exit)  134/72  120/60  124/70  124/78  -   Heart Rate (Admit)  81 bpm  72 bpm  56 bpm  67 bpm  -   Heart Rate (Exercise)  116 bpm  92 bpm  94 bpm  94 bpm  -   Heart Rate (Exit)  80 bpm  70 bpm  54 bpm  66 bpm  -   Oxygen Saturation (Exit)  100 %  -  -  -  -   Rating of Perceived Exertion (Exercise)  '11  15  13  12  ' -   Symptoms  -  none  none  none  -   Comments  -  second full day of exercise  -  -  -   Duration  -  Progress to 45 minutes of aerobic exercise without signs/symptoms of physical distress  Progress to 45 minutes of aerobic exercise without signs/symptoms of physical distress  Progress to 45 minutes of aerobic exercise without signs/symptoms of physical distress  -   Intensity  -  THRR unchanged  THRR unchanged  THRR unchanged  -     Progression   Progression  -  Continue to progress workloads to maintain intensity without signs/symptoms of physical distress.  Continue to progress workloads to maintain intensity without signs/symptoms of physical distress.  Continue to progress workloads to maintain intensity without signs/symptoms of physical distress.  -   Average METs  -  2.4  2.2  1.98  -     Resistance Training   Training Prescription  -  Yes  Yes  Yes  -   Weight  -  2 lb  2 lb  2 lb  -   Reps  -  10-15  10-15  10-15  -     Interval Training   Interval Training  -  No  No  No  -     Treadmill   MPH  -  -  1.5  1.5  -   Grade  -  -  0  0  -   Minutes  -  -  15  15  -   METs  -  -  2.15  2.15  -     NuStep   Level  -  '1  1  1  ' -   SPM  -  -  80  -  -   Minutes  -  '15  15  15  ' -   METs  -  2.8  2.2  1.8  -     Biostep-RELP   Level  -  1  -  1  -   Minutes  -  15  -  15  -   METs  -  2  -  2  -      Track   Laps  -  7  -  -  -   Minutes  -  10  -  -  -     Home Exercise Plan   Plans to continue exercise at  -  -  -  -  Home (comment) walking   Frequency  -  -  -  -  Add 2 additional days to program exercise sessions.   Initial Home Exercises Provided  -  -  -  -  10/27/17   Row Name 11/04/17 1500 11/17/17 1500 12/02/17 1500 12/14/17 1300       Response to Exercise   Blood Pressure (Admit)  132/74  124/70  126/58  114/70    Blood Pressure (Exercise)  120/58  112/58  124/70  122/54    Blood Pressure (Exit)  98/58  122/56  126/54  128/60    Heart Rate (Admit)  64 bpm  66 bpm  62 bpm  69 bpm    Heart Rate (Exercise)  79 bpm  79 bpm  89 bpm  72 bpm    Heart Rate (Exit)  60 bpm  55 bpm  55 bpm  64 bpm    Rating of Perceived Exertion (Exercise)  '12  12  12  15    ' Symptoms  none  none  none  none    Duration  Progress to 45 minutes of aerobic exercise without signs/symptoms of physical distress  Continue with 45 min of aerobic exercise without signs/symptoms of physical distress.  Continue with 45 min of aerobic exercise without signs/symptoms of physical distress.  Continue with 45 min of aerobic exercise without signs/symptoms of physical distress.    Intensity  THRR unchanged  THRR unchanged  THRR unchanged  THRR unchanged      Progression   Progression  Continue to progress workloads to maintain intensity without signs/symptoms of physical distress.  Continue to progress workloads to maintain intensity without signs/symptoms of physical distress.  Continue to progress workloads to maintain intensity without signs/symptoms of physical distress.  Continue to progress workloads to maintain intensity without signs/symptoms of physical distress.    Average METs  2.28  2.22  2.22  2.02      Resistance Training   Training Prescription  Yes  Yes  Yes  Yes    Weight  2 lb  2 lb  2 lb  2 lb    Reps  10-15  10-15  10-15  10-15      Interval Training   Interval Training  No  No  No  No       Treadmill   MPH  1.5  1.5  1.5  1.5    Grade  0  0  0  0    Minutes  '15  15  15  15    ' METs  2.15  2.15  2.15  2.15      NuStep   Level  '3  3  3  3    ' Minutes  '15  15  15  15    ' METs  2.7  2.5  2.5  1.9  Biostep-RELP   Level  '1  1  3  3    ' Minutes  '15  15  15  15    ' METs  '2  2  2  2      ' Home Exercise Plan   Plans to continue exercise at  Home (comment) walking  Home (comment) walking  Home (comment) walking  Home (comment) walking    Frequency  Add 2 additional days to program exercise sessions.  Add 2 additional days to program exercise sessions.  Add 2 additional days to program exercise sessions.  Add 2 additional days to program exercise sessions.    Initial Home Exercises Provided  10/27/17  10/27/17  10/27/17  10/27/17       Exercise Comments: Exercise Comments    Row Name 09/22/17 7408 09/29/17 0856         Exercise Comments  First full day of exercise!  Patient was oriented to gym and equipment including functions, settings, policies, and procedures.  Patient's individual exercise prescription and treatment plan were reviewed.  All starting workloads were established based on the results of the 6 minute walk test done at initial orientation visit.  The plan for exercise progression was also introduced and progression will be customized based on patient's performance and goals.  Zamora increased to 1.5 mph on TM today,         Exercise Goals and Review: Exercise Goals    Row Name 09/17/17 1427             Exercise Goals   Increase Physical Activity  Yes       Intervention  Provide advice, education, support and counseling about physical activity/exercise needs.;Develop an individualized exercise prescription for aerobic and resistive training based on initial evaluation findings, risk stratification, comorbidities and participant's personal goals.       Expected Outcomes  Achievement of increased cardiorespiratory fitness and enhanced flexibility,  muscular endurance and strength shown through measurements of functional capacity and personal statement of participant.       Increase Strength and Stamina  Yes       Intervention  Provide advice, education, support and counseling about physical activity/exercise needs.;Develop an individualized exercise prescription for aerobic and resistive training based on initial evaluation findings, risk stratification, comorbidities and participant's personal goals.       Expected Outcomes  Achievement of increased cardiorespiratory fitness and enhanced flexibility, muscular endurance and strength shown through measurements of functional capacity and personal statement of participant.       Able to understand and use rate of perceived exertion (RPE) scale  Yes       Intervention  Provide education and explanation on how to use RPE scale       Expected Outcomes  Short Term: Able to use RPE daily in rehab to express subjective intensity level;Long Term:  Able to use RPE to guide intensity level when exercising independently       Able to understand and use Dyspnea scale  Yes       Intervention  Provide education and explanation on how to use Dyspnea scale       Expected Outcomes  Short Term: Able to use Dyspnea scale daily in rehab to express subjective sense of shortness of breath during exertion;Long Term: Able to use Dyspnea scale to guide intensity level when exercising independently       Knowledge and understanding of Target Heart Rate Range (THRR)  Yes       Intervention  Provide education and explanation of THRR including how the numbers were predicted and where they are located for reference       Expected Outcomes  Short Term: Able to state/look up THRR;Long Term: Able to use THRR to govern intensity when exercising independently;Short Term: Able to use daily as guideline for intensity in rehab       Able to check pulse independently  Yes       Intervention  Provide education and demonstration on how to  check pulse in carotid and radial arteries.;Review the importance of being able to check your own pulse for safety during independent exercise       Expected Outcomes  Short Term: Able to explain why pulse checking is important during independent exercise;Long Term: Able to check pulse independently and accurately       Understanding of Exercise Prescription  Yes       Intervention  Provide education, explanation, and written materials on patient's individual exercise prescription       Expected Outcomes  Short Term: Able to explain program exercise prescription;Long Term: Able to explain home exercise prescription to exercise independently          Exercise Goals Re-Evaluation : Exercise Goals Re-Evaluation    Row Name 09/22/17 1610 09/25/17 1348 10/07/17 1252 10/22/17 1037 10/27/17 0946     Exercise Goal Re-Evaluation   Exercise Goals Review  Increase Physical Activity;Increase Strength and Stamina;Able to understand and use rate of perceived exertion (RPE) scale  Increase Physical Activity;Increase Strength and Stamina  Increase Physical Activity;Increase Strength and Stamina  Increase Physical Activity;Increase Strength and Stamina  Increase Physical Activity;Understanding of Exercise Prescription;Increase Strength and Stamina;Knowledge and understanding of Target Heart Rate Range (THRR);Able to understand and use rate of perceived exertion (RPE) scale;Able to check pulse independently   Comments  Reviewed RPE scale, THR and program prescription with pt today.  Pt voiced understanding and was given a copy of goals to take home.   Isa is off to a good start in rehab.  She has completed two full days of exercise.  She did not do well on the treadmil as it made her feel dizzy so we moved her to the track.  We will continue to monitor her progression.   Joye has increased her speed on TM and is tolerating exercise well. Staff will continue to monitor progress.  Waver has been doing well in rehab.   She is starting to feel like she is gaining more strength and stamina.   She does walk a little bit at home, to and from her mailbox, but gets short of breath.  We will continue to work with her on the breathing.  Reviewed home exercise with pt today.  Pt plans to walk at home for exercise.  Reviewed THR, pulse, RPE, sign and symptoms, and when to call 911 or MD.  Also discussed weather considerations and indoor options.  Pt voiced understanding.   Expected Outcomes  Short: Use RPE daily to regulate intensity.  Long: Follow program prescription in THR.  Short:  Continue to attend classes regularly.  Long: Follow program prescription for exercise to increase strength and stamina.   Short - Kasandra will continue to attend and will add in exercise at home.  Long - Azia will exercise 3-5 days per week.  Short: Review home exercise guidelines.  Long: Increase home exercise.   Short: Add in two days at home consistently  Long: Start counting pulse some during exercise  [place] Name 11/04/17 1512 11/10/17 0932 11/17/17 1546 12/02/17 1532 12/08/17 0935     Exercise Goal Re-Evaluation   Exercise Goals Review  Increase Physical Activity;Understanding of Exercise Prescription;Increase Strength and Stamina  Increase Physical Activity;Understanding of Exercise Prescription;Increase Strength and Stamina  Increase Physical Activity;Understanding of Exercise Prescription;Increase Strength and Stamina  Increase Physical Activity;Understanding of Exercise Prescription;Increase Strength and Stamina  Increase Physical Activity;Understanding of Exercise Prescription;Increase Strength and Stamina   Comments  Derrian has been doing well in rehab.  She is feeling better now that some of her fluid has started to come off.   She is finally ready to start moving up her workloads and she moved up to level 3 on the NuStep.  We will continue to monitor her progression.   Dyna continues to do well in rehab.  She has been walking some at  home.  She is also starting to feel a little stronger. We will continue to work with her on building more stamina.   Milynn has been doing well in rehab.  She is now up to 2.6 METs on the NuStep.  We will continue to work on her progression.   Diannie has continued to do well in rehab.  She is now up to level 3 on the BioStep.  We will continue to monitor her progress.   Aide has returned to rehab.  She was out for a couple weeks with weather and fluid overlaod. She is getting stronger and has more stamina.  She is able to do more around house.  Vala is walking on her off days for about 15-20 min.     Expected Outcomes  Short: Continue to increase workloads.  Long: Add in more exercise at home on off days.   Short: Continue to attend rehab to increase stamina.  Long: Continue to exercise at home.  Short: Try to increase her workloads.  Long: Continue to walk at home.   Short: Continue to try to increase her workloads more.  Long: Walk more at home on off days.   Short: Increase time of walking at home to 60mn.  Long: Continue to increase activity levels.       Discharge Exercise Prescription (Final Exercise Prescription Changes): Exercise Prescription Changes - 12/14/17 1300      Response to Exercise   Blood Pressure (Admit)  114/70    Blood Pressure (Exercise)  122/54    Blood Pressure (Exit)  128/60    Heart Rate (Admit)  69 bpm    Heart Rate (Exercise)  72 bpm    Heart Rate (Exit)  64 bpm    Rating of Perceived Exertion (Exercise)  15    Symptoms  none    Duration  Continue with 45 min of aerobic exercise without signs/symptoms of physical distress.    Intensity  THRR unchanged      Progression   Progression  Continue to progress workloads to maintain intensity without signs/symptoms of physical distress.    Average METs  2.02      Resistance Training   Training Prescription  Yes    Weight  2 lb    Reps  10-15      Interval Training   Interval Training  No      Treadmill   MPH   1.5    Grade  0    Minutes  15    METs  2.15      NuStep   Level  3    Minutes  15    METs  1.9      Biostep-RELP   Level  3    Minutes  15    METs  2      Home Exercise Plan   Plans to continue exercise at  Home (comment) walking    Frequency  Add 2 additional days to program exercise sessions.    Initial Home Exercises Provided  10/27/17       Nutrition:  Target Goals: Understanding of nutrition guidelines, daily intake of sodium <1544m, cholesterol <2052m calories 30% from fat and 7% or less from saturated fats, daily to have 5 or more servings of fruits and vegetables.  Biometrics: Pre Biometrics - 12/10/17 0919      Pre Biometrics   Height  '5\' 5"'  (1.651 m)    Weight  135 lb 8 oz (61.5 kg)    Waist Circumference  29 inches    Hip Circumference  38 inches    Waist to Hip Ratio  0.76 %    BMI (Calculated)  22.55      Post Biometrics - 12/10/17 0920       Post  Biometrics   Height  '5\' 5"'  (1.651 m)    Weight  141 lb (64 kg)    Waist Circumference  29.5 inches    Hip Circumference  38.5 inches    Waist to Hip Ratio  0.77 %    BMI (Calculated)  23.46       Nutrition Therapy Plan and Nutrition Goals: Nutrition Therapy & Goals - 12/08/17 0939      Nutrition Therapy   RD appointment defered  Yes       Nutrition Discharge: Rate Your Plate Scores: Nutrition Assessments - 12/10/17 1021      MEDFICTS Scores   Pre Score  50    Post Score  52    Score Difference  2       Nutrition Goals Re-Evaluation: Nutrition Goals Re-Evaluation    Row Name 10/22/17 1057 11/10/17 0920 12/08/17 0939         Goals   Nutrition Goal  Meet with dietician  Keep following her current diet.  Keep following her current diet.     Comment  Appt scheduled for 11/6 after class  She missed her appointment with dietician as she did not feel good that day.  She does not want to reschedule and would like to just continue on current diet.  We talked about making sure that she is  eating enough and drinking enough.  Rylah continues to decline nutrition appointment.  She attended the nutrtion education class today and found it very helpful.  She found  that the best piece of advice was the poster about what to eat versus what not to eat.  She doesn't have a big appetite.       Expected Outcome  Short: Meet with dietician to help with heart healthy diet.  Long: Follow recommendations  Short: Continue to follow heart and renal diet.  Long: Continue to eat enough to maintain weight.  Short: Continue to follow heart healthy and renal diet.  Long: Continue to eat to maintain weight.         Nutrition Goals Discharge (Final Nutrition Goals Re-Evaluation): Nutrition Goals Re-Evaluation - 12/08/17 0939      Goals   Nutrition Goal  Keep following her current diet.    Comment  Antoinett continues to decline nutrition appointment.  She attended the nutrtion education class today  and found it very helpful.  She found  that the best piece of advice was the poster about what to eat versus what not to eat.  She doesn't have a big appetite.      Expected Outcome  Short: Continue to follow heart healthy and renal diet.  Long: Continue to eat to maintain weight.        Psychosocial: Target Goals: Acknowledge presence or absence of significant depression and/or stress, maximize coping skills, provide positive support system. Participant is able to verbalize types and ability to use techniques and skills needed for reducing stress and depression.   Initial Review & Psychosocial Screening: Initial Psych Review & Screening - 09/17/17 1405      Initial Review   Current issues with  Current Stress Concerns    Source of Stress Concerns  Chronic Illness;Transportation;Unable to participate in former interests or hobbies    Comments  Her husband has dementia and since her event and surgery he has been moved to [facility] facility which has taken a lot of stress off of her due to not being his sole  caregiver. She still misses having him in the home but visits him almost daily. She lives alone and drives herself so that can be a stressor at times due to having intraoccular lens implants. She would like to attend church regularly again but feels she does not want to drive more than she has to and gets too fatigued once she is there. Once she gets some stamina back we discussed having her find a ride to church.      Family Dynamics   Good Support System?  Yes    Comments  Her husband has dementia and is [place], her daughter lives in [place] but will come visit regularly and stay for longer visits to help at times.       Barriers   Psychosocial barriers to participate in program  The patient should benefit from training in stress management and relaxation.      Screening Interventions   Interventions  Yes;Encouraged to exercise;Program counselor consult;Provide feedback about the scores to participant    Expected Outcomes  Short Term goal: Utilizing psychosocial counselor, staff and physician to assist with identification of specific Stressors or current issues interfering with healing process. Setting desired goal for each stressor or current issue identified.;Long Term Goal: Stressors or current issues are controlled or eliminated.;Short Term goal: Identification and review with participant of any Quality of Life or Depression concerns found by scoring the questionnaire.;Long Term goal: The participant improves quality of Life and PHQ9 Scores as seen by post scores and/or verbalization of changes       Quality of Life Scores:  Quality of Life - 12/10/17 1027      Quality of Life Scores   Health/Function Pre  17.1 %    Health/Function Post  18.83 %    Health/Function % Change  10.12 %    Socioeconomic Pre  21.92 %    Socioeconomic Post  21.57 %    Socioeconomic % Change   -1.6 %    Psych/Spiritual Pre  22.67 %    Psych/Spiritual Post  25.71 %    Psych/Spiritual % Change  13.41 %     Family Pre  24.88 %    Family Post  22.5 %    Family % Change  -9.57 %    GLOBAL Pre  20.11 %    GLOBAL Post  21.35 %    GLOBAL %  Change  6.17 %       PHQ-9: Recent Review Flowsheet Data    Depression screen [facility] 2/9 12/10/2017 09/17/2017   Decreased Interest 0 0   Down, Depressed, Hopeless 1 0   PHQ - 2 Score 1 0   Altered sleeping 0 0   Tired, decreased energy 0 1   Change in appetite 0 0   Feeling bad or failure about yourself  0 0   Trouble concentrating 0 1   Moving slowly or fidgety/restless 0 1   Suicidal thoughts 0 0   PHQ-9 Score 1 3   Difficult doing work/chores Not difficult at all Not difficult at all     Interpretation of Total Score  Total Score Depression Severity:  1-4 = Minimal depression, 5-9 = Mild depression, 10-14 = Moderate depression, 15-19 = Moderately severe depression, 20-27 = Severe depression   Psychosocial Evaluation and Intervention: Psychosocial Evaluation - 09/22/17 0933      Psychosocial Evaluation & Interventions   Interventions  Encouraged to exercise with the program and follow exercise prescription    Comments  Counselor met with Ms. Minette Brine [facility]) today for initial psychosocial evaluation.  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  She has a strong support system with a daughter; a sister-in-law; neighbors; and active involvement in her local church community.  Angila is married but her spouse has severe dementia and lives in a care center.  Jenny has diabetes in addition to her heart issues.  She sleeps well and has a good appetite.  Devanny denies a history of depression or anxiety or any current symptoms and reports typically being in a positive mood.  She does have some stress in her life with her own health and that of her spouse.  Doni has goals to walk better and increase her stamina while in this program.  She will be followed by staff.     Expected Outcomes  Lillyth will benefit from consistent exercise to  achieve her stated goals.  The educational and psychoeducational components of this program will be helpful in understanding and coping more positively with her heart condition.  Staff will follow.    Continue Psychosocial Services   Follow up required by staff       Psychosocial Re-Evaluation: Psychosocial Re-Evaluation    Row Name 10/22/17 1058 11/10/17 0917 12/08/17 0941 12/10/17 0934       Psychosocial Re-Evaluation   Current issues with  Current Stress Concerns  Current Stress Concerns  Current Stress Concerns  None Identified    Comments  Jatia has been doing well in rehab.  She continues to stress over her healthy issues, but overall has remained postive.  She contineus to sleep well and denies symptoms of depression.   Deneka has been doing well in rehab.  She is feeling better after getting her fluid off.  She continues to feel good and staying postivie.  She still has not been back to church afraid of falling.  We talked about getting back to church and using an usher's arm to get to her seat.  She contiues to sleep good.Corliss Skains has doing well in rehab.  She feels better when her fluid levels are better controlled.  She still has not gone back to church yet.  She is worried about being judged by how she looks.  We talked about not worring about what others think as much and getting herself filled up.  She continues  to sleep good and staying postive.   Counselor follow up with [facility] today reporting progress in her goals of feeling stronger and healthier overall since coming into this program.  She continues to sleep well and has a good appetite.  Winni was not "stressed" about the upcoming holidays and remains positive in her outlook.   Counselor commended [employer] for her progress and hard work since coming into this program.      Expected Outcomes  Short: Continue to attend class.  Long: Remain postive and cope with health issues.   Short: Get back to church.  Long: Remain postivie and  cope.  Short: Go back to church.  Long: Remain postivie and continue to cope with her heart failure.   Continue exercising and remaining positive     Interventions  -  Encouraged to attend Cardiac Rehabilitation for the exercise  Encouraged to attend Cardiac Rehabilitation for the exercise;Stress management education  -    Continue Psychosocial Services   -  -  Follow up required by staff  -    Comments  -  Afraid of falling when she weak.  Afraid of falling when she walks  -      Initial Review   Source of Stress Concerns  Chronic Illness  Chronic Illness;Unable to participate in former interests or hobbies  Chronic Illness;Unable to participate in former interests or hobbies  -       Psychosocial Discharge (Final Psychosocial Re-Evaluation): Psychosocial Re-Evaluation - 12/10/17 0934      Psychosocial Re-Evaluation   Current issues with  None Identified    Comments  Counselor follow up with [facility] today reporting progress in her goals of feeling stronger and healthier overall since coming into this program.  She continues to sleep well and has a good appetite.  Brittanny was not "stressed" about the upcoming holidays and remains positive in her outlook.   Counselor commended [employer] for her progress and hard work since coming into this program.      Expected Outcomes  Continue exercising and remaining positive        Vocational Rehabilitation: Provide vocational rehab assistance to qualifying candidates.   Vocational Rehab Evaluation & Intervention: Vocational Rehab - 09/17/17 1423      Initial Vocational Rehab Evaluation & Intervention   Assessment shows need for Vocational Rehabilitation  No       Education: Education Goals: Education classes will be provided on a variety of topics geared toward better understanding of heart health and risk factor modification. Participant will state understanding/return demonstration of topics presented as noted by education test scores.  Learning  Barriers/Preferences: Learning Barriers/Preferences - 09/17/17 1421      Learning Barriers/Preferences   Learning Barriers  Sight;Exercise Concerns Has bilateral intraoccular lens implants, has appointment with optometrist tomorrow.  Does not walk well so has concerns about exercise and being able to do it.     Learning Preferences  None       Education Topics: General Nutrition Guidelines/Fats and Fiber: -Group instruction provided by verbal, written material, models and posters to present the general guidelines for heart healthy nutrition. Gives an explanation and review of dietary fats and fiber.   Cardiac Rehab from 12/08/2017 in [facility] Cardiac and Pulmonary Rehab  Date  12/08/17  Educator  CR  Instruction Review Code  1- Verbalizes Understanding      Controlling Sodium/Reading Food Labels: -Group verbal and written material supporting the discussion of sodium use in heart healthy nutrition. Review and  explanation with models, verbal and written materials for utilization of the food label.   Cardiac Rehab from 12/08/2017 in [facility] Cardiac and Pulmonary Rehab  Date  12/08/17  Educator  CR  Instruction Review Code  1- Verbalizes Understanding      Exercise Physiology & Risk Factors: - Group verbal and written instruction with models to review the exercise physiology of the cardiovascular system and associated critical values. Details cardiovascular disease risk factors and the goals associated with each risk factor.   Aerobic Exercise & Resistance Training: - Gives group verbal and written discussion on the health impact of inactivity. On the components of aerobic and resistive training programs and the benefits of this training and how to safely progress through these programs.   Cardiac Rehab from 12/08/2017 in [facility] Cardiac and Pulmonary Rehab  Date  10/22/17  Educator  [facility]  Instruction Review Code  1- Verbalizes Understanding      Flexibility, Balance, General Exercise  Guidelines: - Provides group verbal and written instruction on the benefits of flexibility and balance training programs. Provides general exercise guidelines with specific guidelines to those with heart or lung disease. Demonstration and skill practice provided.   Cardiac Rehab from 12/08/2017 in [facility] Cardiac and Pulmonary Rehab  Date  10/27/17  Educator  AS  Instruction Review Code  1- Verbalizes Understanding      Stress Management: - Provides group verbal and written instruction about the health risks of elevated stress, cause of high stress, and healthy ways to reduce stress.   Cardiac Rehab from 12/08/2017 in [facility] Cardiac and Pulmonary Rehab  Date  11/03/17  Educator  [facility]  Instruction Review Code  1- Verbalizes Understanding      Depression: - Provides group verbal and written instruction on the correlation between heart/lung disease and depressed mood, treatment options, and the stigmas associated with seeking treatment.   Cardiac Rehab from 12/08/2017 in [facility] Cardiac and Pulmonary Rehab  Date  09/29/17  Educator  [facility]  Instruction Review Code  1- Verbalizes Understanding      Anatomy & Physiology of the Heart: - Group verbal and written instruction and models provide basic cardiac anatomy and physiology, with the coronary electrical and arterial systems. Review of: AMI, Angina, Valve disease, Heart Failure, Cardiac Arrhythmia, Pacemakers, and the ICD.   Cardiac Procedures: - Group verbal and written instruction to review commonly prescribed medications for heart disease. Reviews the medication, class of the drug, and side effects. Includes the steps to properly store meds and maintain the prescription regimen. (beta blockers and nitrates)   Cardiac Rehab from 12/08/2017 in [facility] Cardiac and Pulmonary Rehab  Date  11/10/17  Educator  SB  Instruction Review Code  1- Verbalizes Understanding      Cardiac Medications I: - Group verbal and written instruction to review  commonly prescribed medications for heart disease. Reviews the medication, class of the drug, and side effects. Includes the steps to properly store meds and maintain the prescription regimen.   Cardiac Rehab from 12/08/2017 in [facility] Cardiac and Pulmonary Rehab  Date  11/17/17  Educator  KS  Instruction Review Code  1- Verbalizes Understanding      Cardiac Medications II: -Group verbal and written instruction to review commonly prescribed medications for heart disease. Reviews the medication, class of the drug, and side effects. (all other drug classes)    Go Sex-Intimacy & Heart Disease, Get SMART - Goal Setting: - Group verbal and written instruction through game format to discuss heart disease  and the return to sexual intimacy. Provides group verbal and written material to discuss and apply goal setting through the application of the S.M.A.R.T. Method.   Cardiac Rehab from 12/08/2017 in [facility] Cardiac and Pulmonary Rehab  Date  11/10/17  Educator  SB  Instruction Review Code  1- Verbalizes Understanding      Other Matters of the Heart: - Provides group verbal, written materials and models to describe Heart Failure, Angina, Valve Disease, Peripheral Artery Disease, and Diabetes in the realm of heart disease. Includes description of the disease process and treatment options available to the cardiac patient.   Exercise & Equipment Safety: - Individual verbal instruction and demonstration of equipment use and safety with use of the equipment.   Cardiac Rehab from 12/08/2017 in [facility] Cardiac and Pulmonary Rehab  Date  09/17/17  Educator  [place]  Instruction Review Code  1- Verbalizes Understanding      Infection Prevention: - Provides verbal and written material to individual with discussion of infection control including proper hand washing and proper equipment cleaning during exercise session.   Cardiac Rehab from 12/08/2017 in [facility] Cardiac and Pulmonary Rehab  Date  09/17/17  Educator   [place]  Instruction Review Code  1- Verbalizes Understanding      Falls Prevention: - Provides verbal and written material to individual with discussion of falls prevention and safety.   Cardiac Rehab from 12/08/2017 in [facility] Cardiac and Pulmonary Rehab  Date  09/17/17  Educator  KS  Instruction Review Code  1- Verbalizes Understanding      Diabetes: - Individual verbal and written instruction to review signs/symptoms of diabetes, desired ranges of glucose level fasting, after meals and with exercise. Acknowledge that pre and post exercise glucose checks will be done for 3 sessions at entry of program.   Cardiac Rehab from 12/08/2017 in [facility] Cardiac and Pulmonary Rehab  Date  09/17/17  Educator  KS  Instruction Review Code  1- Verbalizes Understanding      Other: -Provides group and verbal instruction on various topics (see comments)   Cardiac Rehab from 12/08/2017 in [facility] Cardiac and Pulmonary Rehab  Date  10/29/17 [know your numbers and risk factors]  Educator  [facility]  Instruction Review Code  1- Verbalizes Understanding       Knowledge Questionnaire Score: Knowledge Questionnaire Score - 12/10/17 1019      Knowledge Questionnaire Score   Pre Score  23/28    Post Score  19/28 results reviewed with pt       Core Components/Risk Factors/Patient Goals at Admission: Personal Goals and Risk Factors at Admission - 09/17/17 1358      Core Components/Risk Factors/Patient Goals on Admission    Weight Management  Weight Maintenance;Yes    Intervention  Weight Management: Provide education and appropriate resources to help participant work on and attain dietary goals.;Weight Management: Develop a combined nutrition and exercise program designed to reach desired caloric intake, while maintaining appropriate intake of nutrient and fiber, sodium and fats, and appropriate energy expenditure required for the weight goal.    Admit Weight  135 lb 8 oz (61.5 kg)    Expected Outcomes   Understanding recommendations for meals to include 15-35% energy as protein, 25-35% energy from fat, 35-60% energy from carbohydrates, less than 237m of dietary cholesterol, 20-35 gm of total fiber daily;Understanding of distribution of calorie intake throughout the day with the consumption of 4-5 meals/snacks;Weight Maintenance: Understanding of the daily nutrition guidelines, which includes 25-35% calories from fat, 7%  or less cal from saturated fats, less than 29m cholesterol, less than 1.5gm of sodium, & 5 or more servings of fruits and vegetables daily    Diabetes  Yes    Intervention  Provide education about signs/symptoms and action to take for hypo/hyperglycemia.;Provide education about proper nutrition, including hydration, and aerobic/resistive exercise prescription along with prescribed medications to achieve blood glucose in normal ranges: Fasting glucose 65-99 mg/dL    Expected Outcomes  Short Term: Participant verbalizes understanding of the signs/symptoms and immediate care of hyper/hypoglycemia, proper foot care and importance of medication, aerobic/resistive exercise and nutrition plan for blood glucose control.;Long Term: Attainment of HbA1C < 7%.    Heart Failure  Yes    Intervention  Provide a combined exercise and nutrition program that is supplemented with education, support and counseling about heart failure. Directed toward relieving symptoms such as shortness of breath, decreased exercise tolerance, and extremity edema.    Expected Outcomes  Improve functional capacity of life;Short term: Attendance in program 2-3 days a week with increased exercise capacity. Reported lower sodium intake. Reported increased fruit and vegetable intake. Reports medication compliance.;Short term: Daily weights obtained and reported for increase. Utilizing diuretic protocols set by physician.;Long term: Adoption of self-care skills and reduction of barriers for early signs and symptoms recognition and  intervention leading to self-care maintenance.    Hypertension  Yes    Intervention  Provide education on lifestyle modifcations including regular physical activity/exercise, weight management, moderate sodium restriction and increased consumption of fresh fruit, vegetables, and low fat dairy, alcohol moderation, and smoking cessation.;Monitor prescription use compliance.    Expected Outcomes  Short Term: Continued assessment and intervention until BP is < 140/943mHG in hypertensive participants. < 130/8042mG in hypertensive participants with diabetes, heart failure or chronic kidney disease.;Long Term: Maintenance of blood pressure at goal levels.    Lipids  Yes    Intervention  Provide education and support for participant on nutrition & aerobic/resistive exercise along with prescribed medications to achieve LDL <53m16mDL >40mg62m Expected Outcomes  Short Term: Participant states understanding of desired cholesterol values and is compliant with medications prescribed. Participant is following exercise prescription and nutrition guidelines.;Long Term: Cholesterol controlled with medications as prescribed, with individualized exercise RX and with personalized nutrition plan. Value goals: LDL < 53mg,56m > 40 mg.    Personal Goal Other  Yes    Personal Goal  She would like to feel well enough to attend church regularly and not feet poorly or fatigued during.     Intervention  Patient will come to cardiac rehab and work on building stamina and strength.     Expected Outcomes  She will gain enough strength and stamina to attend church regularly.        Core Components/Risk Factors/Patient Goals Review:  Goals and Risk Factor Review    Row Name 10/22/17 1052 11/10/17 0902 12/08/17 0937  [id number]   Core Components/Risk Factors/Patient Goals Review   Personal Goals Review  Weight Management/Obesity;Hypertension;Lipids;Heart Failure;Diabetes  Weight Management/Obesity;Hypertension;Lipids;Heart  Failure;Diabetes  Weight Management/Obesity;Hypertension;Lipids;Heart Failure;Diabetes     Review  FannieShanielleeen doing well in rehab.  She continues to have SOB and swelling with her heart failure. She saw the doctor yesterday and they adjusted some of her medications and increased her lasix.  Her weight was thus down some this morning and her swelling starting to improve.  Her blood pressures and blood sugars have been doing well and she  monitors them at home.   Caliyah's weight was down 8 lbs today!!  She is starting to feel better after the weight has come off.  No other SOB or other swelling!  Blood pressures have been good and medications are working.  She continues to check blood pressure and sugars at home and are doing well.  Haillee's weight has been up and down with her fluid.  Today she was up to 140lbs.  She is feeling better now that her fluid is off and her breathing has gotten better too.  She did talk with the doctor about taking extra Lasix when needer.  She contines to get good readings for her blood pressures and blood sugars.  Meds are doing well.      Expected Outcomes  Short: Continue to work on swelling in ankles.  Long: Manage heart failure symptoms.   Short: Call doctor to establish plan for preventing fluid overload.  Long: Continue to manage heart failure symptoms.   Short: Continue to watch weight daily.  Long: Continue to manage heart failure.         Core Components/Risk Factors/Patient Goals at Discharge (Final Review):  Goals and Risk Factor Review - 12/08/17 0937      Core Components/Risk Factors/Patient Goals Review   Personal Goals Review  Weight Management/Obesity;Hypertension;Lipids;Heart Failure;Diabetes    Review  Samayah's weight has been up and down with her fluid.  Today she was up to 140lbs.  She is feeling better now that her fluid is off and her breathing has gotten better too.  She did talk with the doctor about taking extra Lasix when needer.  She contines to  get good readings for her blood pressures and blood sugars.  Meds are doing well.     Expected Outcomes  Short: Continue to watch weight daily.  Long: Continue to manage heart failure.        ITP Comments: ITP Comments    Row Name 09/17/17 1354 09/30/17 0657 10/20/17 1541 10/28/17 0655 11/25/17 0611   ITP Comments  Med review completed. Initial ITP created. Diagnosis information can be found in [facility] encouter dated 08/19/2017.  30 day Review. Continue with ITP unless directed changes per Medical Director Review.   Return call to pt from this morning. She has been out since last Tuesday with swelling in her ankles. She has an appointment tomorrow with Dr. Haroldine Laws and I encouraged her to show him her ankles. I also encouraged her to return to rehab on Thursday.   30 day review. Continue with ITP unless directed changes per Medical Director review.   30 day review. Continue with ITP unless directed changes per Medical Director review.    [place] Name 12/23/17 0701           ITP Comments  30 day review. Continue with ITP unless directed changes per Medical Director review.           Comments:

## 2017-12-24 ENCOUNTER — Ambulatory Visit (INDEPENDENT_AMBULATORY_CARE_PROVIDER_SITE_OTHER): Payer: Medicare HMO | Admitting: Urology

## 2017-12-24 ENCOUNTER — Encounter: Payer: Self-pay | Admitting: Urology

## 2017-12-24 ENCOUNTER — Encounter: Payer: Medicare HMO | Attending: Internal Medicine | Admitting: *Deleted

## 2017-12-24 VITALS — BP 169/70 | HR 62 | Ht 67.0 in | Wt 130.0 lb

## 2017-12-24 DIAGNOSIS — Z951 Presence of aortocoronary bypass graft: Secondary | ICD-10-CM | POA: Insufficient documentation

## 2017-12-24 DIAGNOSIS — I5022 Chronic systolic (congestive) heart failure: Secondary | ICD-10-CM | POA: Insufficient documentation

## 2017-12-24 DIAGNOSIS — N200 Calculus of kidney: Secondary | ICD-10-CM | POA: Diagnosis not present

## 2017-12-24 DIAGNOSIS — N281 Cyst of kidney, acquired: Secondary | ICD-10-CM | POA: Diagnosis not present

## 2017-12-24 NOTE — Progress Notes (Signed)
Daily Session Note  Patient Details  Name: [name] [name] MRN: [id number] Date of Birth: 09/18/1935 Referring Provider:     Cardiac Rehab from 09/17/2017 in [facility] Cardiac and Pulmonary Rehab  Referring Provider  Bensimhon      Encounter Date: 12/24/2017  Check In: Session Check In - 12/24/17 1350      Check-In   Location  [facility]-Cardiac & Pulmonary Rehab    Staff Present  Nada Maclachlan, BA, ACSM CEP, Exercise Physiologist;Kirra Verga Luan Pulling, MA, ACSM RCEP, Exercise Physiologist;Carroll Enterkin, RN, BSN    Supervising physician immediately available to respond to emergencies  See telemetry face sheet for immediately available ER MD    Medication changes reported      No    Fall or balance concerns reported     No    Warm-up and Cool-down  Performed on first and last piece of equipment    Resistance Training Performed  Yes    VAD Patient?  No      Pain Assessment   Currently in Pain?  No/denies          Social History   Tobacco Use  Smoking Status Never Smoker  Smokeless Tobacco Never Used    Goals Met:  Independence with exercise equipment Exercise tolerated well No report of cardiac concerns or symptoms Strength training completed today  Goals Unmet:  Not Applicable  Comments: Pt able to follow exercise prescription today without complaint.  Will continue to monitor for progression.    Dr. Emily Filbert is Medical Director for [place] [facility].

## 2017-12-24 NOTE — Progress Notes (Signed)
12/24/2017 11:21 AM   [name] [name] March 21, 1935 [id number]  Referring provider: Kirk Ruths, MD [facility] [place], [place] [address]  Chief Complaint  Patient presents with  . Post-op Follow-up    w.KUB    HPI: The patient is an 83 year old female with multiple medical comorbidities including severe CAD status post CABG in February 2018 as well as diabetic nephropathy (Cr at baseline is 2.5 - 3) who presents today for evaluation of a left ureteral stone as well as renal cyst.  1.  Left 5 mm ureteral calculus This was seen on CT scan performed on 12/03/2017.  There is no appreciable hydronephrosis bilaterally.  It is visible on scout imaging.  Patient has no previous history of nephrolithiasis.  She also has small nonobstructing bilateral stones.  She is currently asymptomatic.   She underwent a KUB today which shows no apparent distal calculus suggesting that it has passed.  2.  Left renal cyst The patient has multiple cyst on her left kidney which was seen on noncontrast CT scan.  The largest is 6 cm.  It has mild septations.  She has not had contrasted studies due to her renal function.  The plan has been to repeat a renal ultrasound in 3 months.    PMH Past Medical History:  Diagnosis Date  . [name]    a. LHC 02/03/17 for unstable angina showed severe 3v CAD w/ sequential 70% & 80% p-mLAD, 99% mLCx, CTO mRCA, elevated LV filling pressure  . Colon polyp 2010  . Diabetes mellitus with complication ([place])   . Glaucoma   . Hypertension     Surgical History: Past Surgical History:  Procedure Laterality Date  . APPENDECTOMY    . BREAST BIOPSY Right 1999  . COLONOSCOPY  2010  . CORONARY ARTERY BYPASS GRAFT N/A 02/05/2017   Procedure: CORONARY ARTERY BYPASS GRAFTING (CABG) x 4                                                                               LIMA-LAD SEQ SVG-DIAG-OM SVG-PD;  Surgeon: Gaye Pollack, MD;   Location: [facility];  Service: Open Heart Surgery;  Laterality: N/A;  . IABP INSERTION N/A 02/04/2017   Procedure: IABP Insertion;  Surgeon: Leonie Man, MD;  Location: [facility];  Service: Cardiovascular;  Laterality: N/A;  . LEFT HEART CATH AND CORONARY ANGIOGRAPHY Left 02/03/2017   Procedure: Left Heart Cath and Coronary Angiography;  Surgeon: Nelva Bush, MD;  Location: [facility];  Service: Cardiovascular;  Laterality: Left;  . MITRAL VALVE REPAIR N/A 02/05/2017   Procedure: MITRAL VALVE REPLACEMENT (MVR) WITH MAGNA MITRAL EASE PERICARDIAL BIOPROSTHESIS 25 MM;  Surgeon: Gaye Pollack, MD;  Location: [facility];  Service: Open Heart Surgery;  Laterality: N/A;  . PARATHYROIDECTOMY     1986  . RIGHT HEART CATH N/A 02/04/2017   Procedure: Right Heart Cath;  Surgeon: Leonie Man, MD;  Location: [facility];  Service: Cardiovascular;  Laterality: N/A;  . TEE WITHOUT CARDIOVERSION N/A 02/05/2017   Procedure: TRANSESOPHAGEAL ECHOCARDIOGRAM (TEE);  Surgeon: Gaye Pollack, MD;  Location: [facility]  OR;  Service: Open Heart Surgery;  Laterality: N/A;    Home Medications:  Allergies as of 12/24/2017   No Known Allergies     Medication List        Accurate as of 12/24/17 11:21 AM. Always use your most recent med list.          acetaminophen 500 MG tablet Commonly known as:  TYLENOL Take 500 mg by mouth every 6 (six) hours as needed for mild pain.   aspirin EC 81 MG tablet Take 1 tablet (81 mg total) by mouth daily.   carvedilol 3.125 MG tablet Commonly known as:  COREG TAKE 1 TABLET BY MOUTH TWICE DAILY   furosemide 40 MG tablet Commonly known as:  LASIX Take 40 mg 2 (two) times daily by mouth.   hydrALAZINE 50 MG tablet Commonly known as:  APRESOLINE Take 0.5 tablets (25 mg total) by mouth 2 (two) times daily. HOLD IF systolic BP <557   isosorbide mononitrate 30 MG 24 hr tablet Commonly known as:  IMDUR TAKE 1 TABLET BY MOUTH ONCE DAILY   latanoprost 0.005 %  ophthalmic solution Commonly known as:  XALATAN Place 1 drop into both eyes at bedtime.   Potassium Chloride ER 20 MEQ Tbcr Take 20 mEq by mouth daily.   rosuvastatin 40 MG tablet Commonly known as:  CRESTOR Take 1 tablet (40 mg total) by mouth daily.   timolol 0.5 % ophthalmic solution Commonly known as:  BETIMOL Place 1 drop into both eyes 2 (two) times daily.       Allergies: No Known Allergies  Family History: Family History  Problem Relation Age of Onset  . Diabetes Father   . Diabetes Sister   . Bladder Cancer Neg Hx   . Kidney cancer Neg Hx   . Prostate cancer Neg Hx     Social History:  reports that  has never smoked. she has never used smokeless tobacco. She reports that she does not drink alcohol or use drugs.  ROS: UROLOGY Frequent Urination?: No Hard to postpone urination?: No Burning/pain with urination?: No Get up at night to urinate?: No Leakage of urine?: No Urine stream starts and stops?: No Trouble starting stream?: No Do you have to strain to urinate?: No Blood in urine?: No Urinary tract infection?: No Sexually transmitted disease?: No Injury to kidneys or bladder?: No Painful intercourse?: No Weak stream?: No Currently pregnant?: No Vaginal bleeding?: No Last menstrual period?: n  Gastrointestinal Nausea?: No Vomiting?: No Indigestion/heartburn?: No Diarrhea?: No Constipation?: No  Constitutional Fever: No Night sweats?: No Weight loss?: No Fatigue?: No  Skin Skin rash/lesions?: No Itching?: No  Eyes Blurred vision?: No Double vision?: No  Ears/Nose/Throat Sore throat?: No Sinus problems?: No  Hematologic/Lymphatic Swollen glands?: No Easy bruising?: No  Cardiovascular Leg swelling?: No Chest pain?: No  Respiratory Cough?: No Shortness of breath?: No  Endocrine Excessive thirst?: No  Musculoskeletal Back pain?: No Joint pain?: No  Neurological Headaches?: No Dizziness?:  No  Psychologic Depression?: No Anxiety?: No  Physical Exam: BP (!) 169/70   Pulse 62   Ht 5\' 7"  (1.702 m)   Wt 130 lb (59 kg)   BMI 20.36 kg/m   Constitutional:  Alert and oriented, No acute distress. HEENT: [place] AT, moist mucus membranes.  Trachea midline, no masses. Cardiovascular: No clubbing, cyanosis, or edema. Respiratory: Normal respiratory effort, no increased work of breathing. GI: Abdomen is soft, nontender, nondistended, no abdominal masses GU: No CVA tenderness.  Skin: No rashes,  bruises or suspicious lesions. Lymph: No cervical or inguinal adenopathy. Neurologic: Grossly intact, no focal deficits, moving all 4 extremities. Psychiatric: Normal mood and affect.  Laboratory Data: Lab Results  Component Value Date   WBC 6.0 06/30/2017   HGB 10.7 (L) 06/30/2017   HCT 32.3 (L) 06/30/2017   MCV 78.6 (L) 06/30/2017   PLT 166 06/30/2017    Lab Results  Component Value Date   CREATININE 2.84 (H) 11/04/2017    No results found for: PSA  No results found for: TESTOSTERONE  Lab Results  Component Value Date   HGBA1C 6.3 (H) 02/03/2017    Urinalysis    Component Value Date/Time   COLORURINE YELLOW (A) 06/29/2017 0530   APPEARANCEUR Cloudy (A) 12/04/2017 1116   LABSPEC 1.005 06/29/2017 0530   PHURINE 6.0 06/29/2017 0530   GLUCOSEU Negative 12/04/2017 1116   HGBUR MODERATE (A) 06/29/2017 0530   BILIRUBINUR Negative 12/04/2017 1116   KETONESUR NEGATIVE 06/29/2017 0530   PROTEINUR 2+ (A) 12/04/2017 1116   PROTEINUR NEGATIVE 06/29/2017 0530   NITRITE Positive (A) 12/04/2017 1116   NITRITE NEGATIVE 06/29/2017 0530   LEUKOCYTESUR 1+ (A) 12/04/2017 1116    Pertinent Imaging: KUB reviewed as above  Assessment & Plan:    1.  Left ureteral stone Stone is not visible on KUB today.  Punctate stones in the no further workup.  She can stop taking medical expulsive therapy this time.  2.  Left renal cyst with mild septations I discussed the patient that the  cyst on her left kidney has mild septations.  Ideally, we would obtain a contrasted CT or MRI.  However, her renal function precludes this.  Given her age and multiple medical comorbidities, we will continue to monitor this with active surveillance.  We will repeat a renal ultrasound in 3 months  Return in about 3 months (around 03/24/2018) for renal u/s prior.  Nickie Retort, MD  [facility] [address] [place], [place] [address] [phone]

## 2017-12-29 ENCOUNTER — Encounter: Payer: Medicare HMO | Admitting: *Deleted

## 2017-12-29 DIAGNOSIS — Z951 Presence of aortocoronary bypass graft: Secondary | ICD-10-CM | POA: Diagnosis not present

## 2017-12-29 DIAGNOSIS — I5022 Chronic systolic (congestive) heart failure: Secondary | ICD-10-CM | POA: Diagnosis not present

## 2017-12-29 NOTE — Patient Instructions (Signed)
Discharge Instructions  Patient Details  Name: [name] [name] MRN: [id number] Date of Birth: 09-28-1935 Referring Provider:  Jolaine Artist, MD   Number of Visits: 84  Reason for Discharge:  Patient reached a stable level of exercise. Patient independent in their exercise. Patient has met program and personal goals.  Smoking History:  Social History   Tobacco Use  Smoking Status Never Smoker  Smokeless Tobacco Never Used    Diagnosis:  [name]  Heart failure, chronic systolic (HCC)  Initial Exercise Prescription: Initial Exercise Prescription - 09/17/17 1400      Date of Initial Exercise RX and Referring Provider   Date  09/17/17    Referring Provider  Bensimhon      Treadmill   MPH  1    Grade  0    Minutes  15    METs  1.77      NuStep   Level  1    SPM  80    Minutes  15    METs  1.7      Biostep-RELP   Level  1    SPM  50    Minutes  15    METs  1.7      Track   Laps  15    Minutes  15    METs  1.7      Prescription Details   Frequency (times per week)  3    Duration  Progress to 45 minutes of aerobic exercise without signs/symptoms of physical distress      Intensity   THRR 40-80% of Max Heartrate  105-128    Ratings of Perceived Exertion  11-13    Perceived Dyspnea  0-4      Resistance Training   Training Prescription  Yes    Weight  2 lb    Reps  10-15       Discharge Exercise Prescription (Final Exercise Prescription Changes): Exercise Prescription Changes - 12/14/17 1300      Response to Exercise   Blood Pressure (Admit)  114/70    Blood Pressure (Exercise)  122/54    Blood Pressure (Exit)  128/60    Heart Rate (Admit)  69 bpm    Heart Rate (Exercise)  72 bpm    Heart Rate (Exit)  64 bpm    Rating of Perceived Exertion (Exercise)  15    Symptoms  none    Duration  Continue with 45 min of aerobic exercise without signs/symptoms of physical distress.    Intensity  THRR unchanged      Progression   Progression   Continue to progress workloads to maintain intensity without signs/symptoms of physical distress.    Average METs  2.02      Resistance Training   Training Prescription  Yes    Weight  2 lb    Reps  10-15      Interval Training   Interval Training  No      Treadmill   MPH  1.5    Grade  0    Minutes  15    METs  2.15      NuStep   Level  3    Minutes  15    METs  1.9      Biostep-RELP   Level  3    Minutes  15    METs  2      Home Exercise Plan   Plans to continue exercise at  Home (  comment) walking    Frequency  Add 2 additional days to program exercise sessions.    Initial Home Exercises Provided  10/27/17       Functional Capacity: 6 Minute Walk    Row Name 09/17/17 1429 12/10/17 0921       6 Minute Walk   Phase  Initial  Discharge    Distance  725 feet  1155 feet    Distance % Change  -  59 %    Distance Feet Change  -  430 ft    Walk Time  6 minutes  5.6 minutes    # of Rest Breaks  0  1    MPH  1.37  2.34    METS  1.76  2.2    RPE  11  15    Perceived Dyspnea   -  1    VO2 Peak  6.18  7.7    Symptoms  No  No    Resting HR  82 bpm  67 bpm    Resting BP  136/58  114/70    Resting Oxygen Saturation   -  97 %    Exercise Oxygen Saturation  during 6 min walk  100 %  99 %    Max Ex. HR  116 bpm  98 bpm    Max Ex. BP  134/60  122/54    2 Minute Post BP  134/72  -       Quality of Life: Quality of Life - 12/10/17 1027      Quality of Life Scores   Health/Function Pre  17.1 %    Health/Function Post  18.83 %    Health/Function % Change  10.12 %    Socioeconomic Pre  21.92 %    Socioeconomic Post  21.57 %    Socioeconomic % Change   -1.6 %    Psych/Spiritual Pre  22.67 %    Psych/Spiritual Post  25.71 %    Psych/Spiritual % Change  13.41 %    Family Pre  24.88 %    Family Post  22.5 %    Family % Change  -9.57 %    GLOBAL Pre  20.11 %    GLOBAL Post  21.35 %    GLOBAL % Change  6.17 %       Personal Goals: Goals established at  orientation with interventions provided to work toward goal. Personal Goals and Risk Factors at Admission - 09/17/17 1358      Core Components/Risk Factors/Patient Goals on Admission    Weight Management  Weight Maintenance;Yes    Intervention  Weight Management: Provide education and appropriate resources to help participant work on and attain dietary goals.;Weight Management: Develop a combined nutrition and exercise program designed to reach desired caloric intake, while maintaining appropriate intake of nutrient and fiber, sodium and fats, and appropriate energy expenditure required for the weight goal.    Admit Weight  135 lb 8 oz (61.5 kg)    Expected Outcomes  Understanding recommendations for meals to include 15-35% energy as protein, 25-35% energy from fat, 35-60% energy from carbohydrates, less than 262m of dietary cholesterol, 20-35 gm of total fiber daily;Understanding of distribution of calorie intake throughout the day with the consumption of 4-5 meals/snacks;Weight Maintenance: Understanding of the daily nutrition guidelines, which includes 25-35% calories from fat, 7% or less cal from saturated fats, less than 2023mcholesterol, less than 1.5gm of sodium, & 5 or more servings of fruits and vegetables  daily    Diabetes  Yes    Intervention  Provide education about signs/symptoms and action to take for hypo/hyperglycemia.;Provide education about proper nutrition, including hydration, and aerobic/resistive exercise prescription along with prescribed medications to achieve blood glucose in normal ranges: Fasting glucose 65-99 mg/dL    Expected Outcomes  Short Term: Participant verbalizes understanding of the signs/symptoms and immediate care of hyper/hypoglycemia, proper foot care and importance of medication, aerobic/resistive exercise and nutrition plan for blood glucose control.;Long Term: Attainment of HbA1C < 7%.    Heart Failure  Yes    Intervention  Provide a combined exercise and  nutrition program that is supplemented with education, support and counseling about heart failure. Directed toward relieving symptoms such as shortness of breath, decreased exercise tolerance, and extremity edema.    Expected Outcomes  Improve functional capacity of life;Short term: Attendance in program 2-3 days a week with increased exercise capacity. Reported lower sodium intake. Reported increased fruit and vegetable intake. Reports medication compliance.;Short term: Daily weights obtained and reported for increase. Utilizing diuretic protocols set by physician.;Long term: Adoption of self-care skills and reduction of barriers for early signs and symptoms recognition and intervention leading to self-care maintenance.    Hypertension  Yes    Intervention  Provide education on lifestyle modifcations including regular physical activity/exercise, weight management, moderate sodium restriction and increased consumption of fresh fruit, vegetables, and low fat dairy, alcohol moderation, and smoking cessation.;Monitor prescription use compliance.    Expected Outcomes  Short Term: Continued assessment and intervention until BP is < 140/11m HG in hypertensive participants. < 130/850mHG in hypertensive participants with diabetes, heart failure or chronic kidney disease.;Long Term: Maintenance of blood pressure at goal levels.    Lipids  Yes    Intervention  Provide education and support for participant on nutrition & aerobic/resistive exercise along with prescribed medications to achieve LDL <7060mHDL >40m75m  Expected Outcomes  Short Term: Participant states understanding of desired cholesterol values and is compliant with medications prescribed. Participant is following exercise prescription and nutrition guidelines.;Long Term: Cholesterol controlled with medications as prescribed, with individualized exercise RX and with personalized nutrition plan. Value goals: LDL < 70mg54mL > 40 mg.    Personal Goal  Other  Yes    Personal Goal  She would like to feel well enough to attend church regularly and not feet poorly or fatigued during.     Intervention  Patient will come to cardiac rehab and work on building stamina and strength.     Expected Outcomes  She will gain enough strength and stamina to attend church regularly.         Personal Goals Discharge: Goals and Risk Factor Review - 12/08/17 0937      Core Components/Risk Factors/Patient Goals Review   Personal Goals Review  Weight Management/Obesity;Hypertension;Lipids;Heart Failure;Diabetes    Review  Sherrita's weight has been up and down with her fluid.  Today she was up to 140lbs.  She is feeling better now that her fluid is off and her breathing has gotten better too.  She did talk with the doctor about taking extra Lasix when needer.  She contines to get good readings for her blood pressures and blood sugars.  Meds are doing well.     Expected Outcomes  Short: Continue to watch weight daily.  Long: Continue to manage heart failure.        Exercise Goals and Review: Exercise Goals    Row Name 09/17/17 1427  Exercise Goals   Increase Physical Activity  Yes       Intervention  Provide advice, education, support and counseling about physical activity/exercise needs.;Develop an individualized exercise prescription for aerobic and resistive training based on initial evaluation findings, risk stratification, comorbidities and participant's personal goals.       Expected Outcomes  Achievement of increased cardiorespiratory fitness and enhanced flexibility, muscular endurance and strength shown through measurements of functional capacity and personal statement of participant.       Increase Strength and Stamina  Yes       Intervention  Provide advice, education, support and counseling about physical activity/exercise needs.;Develop an individualized exercise prescription for aerobic and resistive training based on initial  evaluation findings, risk stratification, comorbidities and participant's personal goals.       Expected Outcomes  Achievement of increased cardiorespiratory fitness and enhanced flexibility, muscular endurance and strength shown through measurements of functional capacity and personal statement of participant.       Able to understand and use rate of perceived exertion (RPE) scale  Yes       Intervention  Provide education and explanation on how to use RPE scale       Expected Outcomes  Short Term: Able to use RPE daily in rehab to express subjective intensity level;Long Term:  Able to use RPE to guide intensity level when exercising independently       Able to understand and use Dyspnea scale  Yes       Intervention  Provide education and explanation on how to use Dyspnea scale       Expected Outcomes  Short Term: Able to use Dyspnea scale daily in rehab to express subjective sense of shortness of breath during exertion;Long Term: Able to use Dyspnea scale to guide intensity level when exercising independently       Knowledge and understanding of Target Heart Rate Range (THRR)  Yes       Intervention  Provide education and explanation of THRR including how the numbers were predicted and where they are located for reference       Expected Outcomes  Short Term: Able to state/look up THRR;Long Term: Able to use THRR to govern intensity when exercising independently;Short Term: Able to use daily as guideline for intensity in rehab       Able to check pulse independently  Yes       Intervention  Provide education and demonstration on how to check pulse in carotid and radial arteries.;Review the importance of being able to check your own pulse for safety during independent exercise       Expected Outcomes  Short Term: Able to explain why pulse checking is important during independent exercise;Long Term: Able to check pulse independently and accurately       Understanding of Exercise Prescription  Yes        Intervention  Provide education, explanation, and written materials on patient's individual exercise prescription       Expected Outcomes  Short Term: Able to explain program exercise prescription;Long Term: Able to explain home exercise prescription to exercise independently          Nutrition & Weight - Outcomes: Pre Biometrics - 12/10/17 0919      Pre Biometrics   Height  5' 5" (1.651 m)    Weight  135 lb 8 oz (61.5 kg)    Waist Circumference  29 inches    Hip Circumference  38 inches    Waist to Hip  Ratio  0.76 %    BMI (Calculated)  22.55      Post Biometrics - 12/10/17 0920       Post  Biometrics   Height  5' 5" (1.651 m)    Weight  141 lb (64 kg)    Waist Circumference  29.5 inches    Hip Circumference  38.5 inches    Waist to Hip Ratio  0.77 %    BMI (Calculated)  23.46       Nutrition: Nutrition Therapy & Goals - 12/08/17 0939      Nutrition Therapy   RD appointment defered  Yes       Nutrition Discharge: Nutrition Assessments - 12/10/17 1021      MEDFICTS Scores   Pre Score  50    Post Score  52    Score Difference  2       Education Questionnaire Score: Knowledge Questionnaire Score - 12/10/17 1019      Knowledge Questionnaire Score   Pre Score  23/28    Post Score  19/28 results reviewed with pt       Goals reviewed with patient; copy given to patient.

## 2017-12-29 NOTE — Progress Notes (Signed)
Daily Session Note  Patient Details  Name: [name] [name] MRN: [id number] Date of Birth: 1935-02-14 Referring Provider:     Cardiac Rehab from 09/17/2017 in [facility]  Referring Provider  Bensimhon      Encounter Date: 12/29/2017  Check In: Session Check In - 12/29/17 0833      Check-In   Location  [facility]-Cardiac [facility] Pulmonary Rehab    Staff Present  Nada Maclachlan, BA, ACSM CEP, Exercise Physiologist;Susanne Bice, RN, BSN, CCRP;Joseph Darrin Nipper, [place], ACSM RCEP, Exercise Physiologist    Supervising physician immediately available to respond to emergencies  See telemetry face sheet for immediately available ER MD    Medication changes reported      No    Fall or balance concerns reported     No    Warm-up and Cool-down  Performed on first and last piece of equipment    Resistance Training Performed  Yes    VAD Patient?  No      Pain Assessment   Currently in Pain?  No/denies          Social History   Tobacco Use  Smoking Status Never Smoker  Smokeless Tobacco Never Used    Goals Met:  Independence with exercise equipment Exercise tolerated well No report of cardiac concerns or symptoms Strength training completed today  Goals Unmet:  Not Applicable  Comments: Pt able to follow exercise prescription today without complaint.  Will continue to monitor for progression.    Dr. Emily Filbert is Medical Director for [facility].

## 2017-12-31 DIAGNOSIS — Z951 Presence of aortocoronary bypass graft: Secondary | ICD-10-CM | POA: Diagnosis not present

## 2017-12-31 DIAGNOSIS — I5022 Chronic systolic (congestive) heart failure: Secondary | ICD-10-CM

## 2017-12-31 NOTE — Progress Notes (Signed)
Cardiac Individual Treatment Plan  Patient Details  Name: ODYSSEY VASBINDER MRN: [id number] Date of Birth: [birth year] Referring Provider:     Cardiac Rehab from 09/17/2017 in [facility] Cardiac and Pulmonary Rehab  Referring Provider  Bensimhon      Initial Encounter Date:    Cardiac Rehab from 09/17/2017 in [facility] Cardiac and Pulmonary Rehab  Date  09/17/17  Referring Provider  Bensimhon      Visit Diagnosis: [name]  Heart failure, chronic systolic ([place])  Patient's Home Medications on Admission:  Current Outpatient Medications:  .  acetaminophen (TYLENOL) 500 MG tablet, Take 500 mg by mouth every 6 (six) hours as needed for mild pain. , Disp: , Rfl:  .  aspirin EC 81 MG tablet, Take 1 tablet (81 mg total) by mouth daily., Disp: 90 tablet, Rfl: 3 .  carvedilol (COREG) 3.125 MG tablet, TAKE 1 TABLET BY MOUTH TWICE DAILY, Disp: 180 tablet, Rfl: 3 .  furosemide (LASIX) 40 MG tablet, Take 40 mg 2 (two) times daily by mouth., Disp: , Rfl:  .  hydrALAZINE (APRESOLINE) 50 MG tablet, Take 0.5 tablets (25 mg total) by mouth 2 (two) times daily. HOLD IF systolic BP <588, Disp: 60 tablet, Rfl: 0 .  isosorbide mononitrate (IMDUR) 30 MG 24 hr tablet, TAKE 1 TABLET BY MOUTH ONCE DAILY, Disp: 90 tablet, Rfl: 3 .  latanoprost (XALATAN) 0.005 % ophthalmic solution, Place 1 drop into both eyes at bedtime. , Disp: , Rfl:  .  Potassium Chloride ER 20 MEQ TBCR, Take 20 mEq by mouth daily., Disp: 30 tablet, Rfl: 3 .  rosuvastatin (CRESTOR) 40 MG tablet, Take 1 tablet (40 mg total) by mouth daily., Disp: 90 tablet, Rfl: 3 .  timolol (BETIMOL) 0.5 % ophthalmic solution, Place 1 drop into both eyes 2 (two) times daily., Disp: , Rfl:   Past Medical History: Past Medical History:  Diagnosis Date  . [name]    a. LHC 02/03/17 for unstable angina showed severe 3v CAD w/ sequential 70% & 80% p-mLAD, 99% mLCx, CTO mRCA, elevated LV filling pressure  . Colon polyp 2010  . Diabetes mellitus with  complication ([place])   . Glaucoma   . Hypertension     Tobacco Use: Social History   Tobacco Use  Smoking Status Never Smoker  Smokeless Tobacco Never Used    Labs: Recent Review Flowsheet Data    Labs for ITP Cardiac and Pulmonary Rehab Latest Ref Rng & Units 03/01/2017 03/02/2017 03/03/2017 03/04/2017 03/05/2017   Cholestrol 0 - 200 mg/dL - - - - -   LDLCALC 0 - 99 mg/dL - - - - -   HDL >40 mg/dL - - - - -   Trlycerides <150 mg/dL - - - - -   Hemoglobin A1c 4.8 - 5.6 % - - - - -   PHART 7.350 - 7.450 - - - - -   PCO2ART 32.0 - 48.0 mmHg - - - - -   HCO3 20.0 - 28.0 mmol/L - - - - -   TCO2 0 - 100 mmol/L - - - - -   ACIDBASEDEF 0.0 - 2.0 mmol/L - - - - -   O2SAT % 69.5 68.3 58.5 61.0 74.5       Exercise Target Goals:    Exercise Program Goal: Individual exercise prescription set with THRR, safety & activity barriers. Participant demonstrates ability to understand and report RPE using BORG scale, to self-measure pulse accurately, and to acknowledge the  importance of the exercise prescription.  Exercise Prescription Goal: Starting with aerobic activity 30 plus minutes a day, 3 days per week for initial exercise prescription. Provide home exercise prescription and guidelines that participant acknowledges understanding prior to discharge.  Activity Barriers & Risk Stratification: Activity Barriers & Cardiac Risk Stratification - 09/17/17 1426      Activity Barriers & Cardiac Risk Stratification   Activity Barriers  Back Problems;Balance Concerns;Assistive Device;Other (comment)    Comments  She has back pain and knee pain but is not being treated for it. It causes her to have balance issues but she uses a cane to help.     Cardiac Risk Stratification  High       6 Minute Walk: 6 Minute Walk    Row Name 09/17/17 1429 12/10/17 0921       6 Minute Walk   Phase  Initial  Discharge    Distance  725 feet  1155 feet    Distance % Change  -  59 %    Distance Feet Change  -   430 ft    Walk Time  6 minutes  5.6 minutes    # of Rest Breaks  0  1    MPH  1.37  2.34    METS  1.76  2.2    RPE  11  15    Perceived Dyspnea   -  1    VO2 Peak  6.18  7.7    Symptoms  No  No    Resting HR  82 bpm  67 bpm    Resting BP  136/58  114/70    Resting Oxygen Saturation   -  97 %    Exercise Oxygen Saturation  during 6 min walk  100 %  99 %    Max Ex. HR  116 bpm  98 bpm    Max Ex. BP  134/60  122/54    2 Minute Post BP  134/72  -       Oxygen Initial Assessment: Oxygen Initial Assessment - 10/01/17 1509      Home Oxygen   Home Oxygen Device  None    Sleep Oxygen Prescription  None    Home Exercise Oxygen Prescription  None    Home at Rest Exercise Oxygen Prescription  None      Initial 6 min Walk   Oxygen Used  None      Program Oxygen Prescription   Program Oxygen Prescription  None       Oxygen Re-Evaluation:   Oxygen Discharge (Final Oxygen Re-Evaluation):   Initial Exercise Prescription: Initial Exercise Prescription - 09/17/17 1400      Date of Initial Exercise RX and Referring Provider   Date  09/17/17    Referring Provider  Bensimhon      Treadmill   MPH  1    Grade  0    Minutes  15    METs  1.77      NuStep   Level  1    SPM  80    Minutes  15    METs  1.7      Biostep-RELP   Level  1    SPM  50    Minutes  15    METs  1.7      Track   Laps  15    Minutes  15    METs  1.7      Prescription Details   Frequency (  times per week)  3    Duration  Progress to 45 minutes of aerobic exercise without signs/symptoms of physical distress      Intensity   THRR 40-80% of Max Heartrate  105-128    Ratings of Perceived Exertion  11-13    Perceived Dyspnea  0-4      Resistance Training   Training Prescription  Yes    Weight  2 lb    Reps  10-15       Perform Capillary Blood Glucose checks as needed.  Exercise Prescription Changes: Exercise Prescription Changes    Row Name 09/17/17 1400 09/25/17 1300 10/07/17 1200  10/23/17 0900 10/27/17 0900     Response to Exercise   Blood Pressure (Admit)  136/58  122/60  130/64  132/60  -   Blood Pressure (Exercise)  134/60  122/56  122/64  142/70  -   Blood Pressure (Exit)  134/72  120/60  124/70  124/78  -   Heart Rate (Admit)  81 bpm  72 bpm  56 bpm  67 bpm  -   Heart Rate (Exercise)  116 bpm  92 bpm  94 bpm  94 bpm  -   Heart Rate (Exit)  80 bpm  70 bpm  54 bpm  66 bpm  -   Oxygen Saturation (Exit)  100 %  -  -  -  -   Rating of Perceived Exertion (Exercise)  _0 -   Symptoms  -  none  none  none  -   Comments  -  second full day of exercise  -  -  -   Duration  -  Progress to 45 minutes of aerobic exercise without signs/symptoms of physical distress  Progress to 45 minutes of aerobic exercise without signs/symptoms of physical distress  Progress to 45 minutes of aerobic exercise without signs/symptoms of physical distress  -   Intensity  -  THRR unchanged  THRR unchanged  THRR unchanged  -     Progression   Progression  -  Continue to progress workloads to maintain intensity without signs/symptoms of physical distress.  Continue to progress workloads to maintain intensity without signs/symptoms of physical distress.  Continue to progress workloads to maintain intensity without signs/symptoms of physical distress.  -   Average METs  -  2.4  2.2  1.98  -     Resistance Training   Training Prescription  -  Yes  Yes  Yes  -   Weight  -  2 lb  2 lb  2 lb  -   Reps  -  10-15  10-15  10-15  -     Interval Training   Interval Training  -  No  No  No  -     Treadmill   MPH  -  -  1.5  1.5  -   Grade  -  -  0  0  -   Minutes  -  -  15  15  -   METs  -  -  2.15  2.15  -     NuStep   Level  -  _1 -   SPM  -  -  80  -  -   Minutes  -  _2 -   METs  -  2.8  2.2  1.8  -  Biostep-RELP   Level  -  1  -  1  -   Minutes  -  15  -  15  -   METs  -  2  -  2  -     Track   Laps  -  7  -  -  -   Minutes  -  10  -  -  -     Home  Exercise Plan   Plans to continue exercise at  -  -  -  -  Home (comment) walking   Frequency  -  -  -  -  Add 2 additional days to program exercise sessions.   Initial Home Exercises Provided  -  -  -  -  10/27/17   Row Name 11/04/17 1500 11/17/17 1500 12/02/17 1500 12/14/17 1300       Response to Exercise   Blood Pressure (Admit)  132/74  124/70  126/58  114/70    Blood Pressure (Exercise)  120/58  112/58  124/70  122/54    Blood Pressure (Exit)  98/58  122/56  126/54  128/60    Heart Rate (Admit)  64 bpm  66 bpm  62 bpm  69 bpm    Heart Rate (Exercise)  79 bpm  79 bpm  89 bpm  72 bpm    Heart Rate (Exit)  60 bpm  55 bpm  55 bpm  64 bpm    Rating of Perceived Exertion (Exercise)  _0 Symptoms  none  none  none  none    Duration  Progress to 45 minutes of aerobic exercise without signs/symptoms of physical distress  Continue with 45 min of aerobic exercise without signs/symptoms of physical distress.  Continue with 45 min of aerobic exercise without signs/symptoms of physical distress.  Continue with 45 min of aerobic exercise without signs/symptoms of physical distress.    Intensity  THRR unchanged  THRR unchanged  THRR unchanged  THRR unchanged      Progression   Progression  Continue to progress workloads to maintain intensity without signs/symptoms of physical distress.  Continue to progress workloads to maintain intensity without signs/symptoms of physical distress.  Continue to progress workloads to maintain intensity without signs/symptoms of physical distress.  Continue to progress workloads to maintain intensity without signs/symptoms of physical distress.    Average METs  2.28  2.22  2.22  2.02      Resistance Training   Training Prescription  Yes  Yes  Yes  Yes    Weight  2 lb  2 lb  2 lb  2 lb    Reps  10-15  10-15  10-15  10-15      Interval Training   Interval Training  No  No  No  No      Treadmill   MPH  1.5  1.5  1.5  1.5    Grade  0  0  0  0     Minutes  _1 METs  2.15  2.15  2.15  2.15      NuStep   Level  _2 Minutes  _3 METs  2.7  2.5  2.5  1.9      Biostep-RELP   Level  1  1  3  3    Minutes  _0 METs  _1 Home Exercise Plan   Plans to continue exercise at  Home (comment) walking  Home (comment) walking  Home (comment) walking  Home (comment) walking    Frequency  Add 2 additional days to program exercise sessions.  Add 2 additional days to program exercise sessions.  Add 2 additional days to program exercise sessions.  Add 2 additional days to program exercise sessions.    Initial Home Exercises Provided  10/27/17  10/27/17  10/27/17  10/27/17       Exercise Comments: Exercise Comments    Row Name 09/22/17 3500 09/29/17 0856         Exercise Comments  First full day of exercise!  Patient was oriented to gym and equipment including functions, settings, policies, and procedures.  Patient's individual exercise prescription and treatment plan were reviewed.  All starting workloads were established based on the results of the 6 minute walk test done at initial orientation visit.  The plan for exercise progression was also introduced and progression will be customized based on patient's performance and goals.  Leilany increased to 1.5 mph on TM today,         Exercise Goals and Review: Exercise Goals    Row Name 09/17/17 1427             Exercise Goals   Increase Physical Activity  Yes       Intervention  Provide advice, education, support and counseling about physical activity/exercise needs.;Develop an individualized exercise prescription for aerobic and resistive training based on initial evaluation findings, risk stratification, comorbidities and participant's personal goals.       Expected Outcomes  Achievement of increased cardiorespiratory fitness and enhanced flexibility, muscular endurance and strength shown through measurements of functional  capacity and personal statement of participant.       Increase Strength and Stamina  Yes       Intervention  Provide advice, education, support and counseling about physical activity/exercise needs.;Develop an individualized exercise prescription for aerobic and resistive training based on initial evaluation findings, risk stratification, comorbidities and participant's personal goals.       Expected Outcomes  Achievement of increased cardiorespiratory fitness and enhanced flexibility, muscular endurance and strength shown through measurements of functional capacity and personal statement of participant.       Able to understand and use rate of perceived exertion (RPE) scale  Yes       Intervention  Provide education and explanation on how to use RPE scale       Expected Outcomes  Short Term: Able to use RPE daily in rehab to express subjective intensity level;Long Term:  Able to use RPE to guide intensity level when exercising independently       Able to understand and use Dyspnea scale  Yes       Intervention  Provide education and explanation on how to use Dyspnea scale       Expected Outcomes  Short Term: Able to use Dyspnea scale daily in rehab to express subjective sense of shortness of breath during exertion;Long Term: Able to use Dyspnea scale to guide intensity level when exercising independently       Knowledge and understanding of Target Heart Rate Range (THRR)  Yes       Intervention  Provide education and explanation of THRR including how the numbers  were predicted and where they are located for reference       Expected Outcomes  Short Term: Able to state/look up THRR;Long Term: Able to use THRR to govern intensity when exercising independently;Short Term: Able to use daily as guideline for intensity in rehab       Able to check pulse independently  Yes       Intervention  Provide education and demonstration on how to check pulse in carotid and radial arteries.;Review the importance of  being able to check your own pulse for safety during independent exercise       Expected Outcomes  Short Term: Able to explain why pulse checking is important during independent exercise;Long Term: Able to check pulse independently and accurately       Understanding of Exercise Prescription  Yes       Intervention  Provide education, explanation, and written materials on patient's individual exercise prescription       Expected Outcomes  Short Term: Able to explain program exercise prescription;Long Term: Able to explain home exercise prescription to exercise independently          Exercise Goals Re-Evaluation : Exercise Goals Re-Evaluation    Row Name 09/22/17 0737 09/25/17 1348 10/07/17 1252 10/22/17 1037 10/27/17 0946     Exercise Goal Re-Evaluation   Exercise Goals Review  Increase Physical Activity;Increase Strength and Stamina;Able to understand and use rate of perceived exertion (RPE) scale  Increase Physical Activity;Increase Strength and Stamina  Increase Physical Activity;Increase Strength and Stamina  Increase Physical Activity;Increase Strength and Stamina  Increase Physical Activity;Understanding of Exercise Prescription;Increase Strength and Stamina;Knowledge and understanding of Target Heart Rate Range (THRR);Able to understand and use rate of perceived exertion (RPE) scale;Able to check pulse independently   Comments  Reviewed RPE scale, THR and program prescription with pt today.  Pt voiced understanding and was given a copy of goals to take home.   Kamarah is off to a good start in rehab.  She has completed two full days of exercise.  She did not do well on the treadmil as it made her feel dizzy so we moved her to the track.  We will continue to monitor her progression.   Manasa has increased her speed on TM and is tolerating exercise well. Staff will continue to monitor progress.  Amoura has been doing well in rehab.  She is starting to feel like she is gaining more strength and  stamina.   She does walk a little bit at home, to and from her mailbox, but gets short of breath.  We will continue to work with her on the breathing.  Reviewed home exercise with pt today.  Pt plans to walk at home for exercise.  Reviewed THR, pulse, RPE, sign and symptoms, and when to call 911 or MD.  Also discussed weather considerations and indoor options.  Pt voiced understanding.   Expected Outcomes  Short: Use RPE daily to regulate intensity.  Long: Follow program prescription in THR.  Short:  Continue to attend classes regularly.  Long: Follow program prescription for exercise to increase strength and stamina.   Short - Durene will continue to attend and will add in exercise at home.  Long - Saraiyah will exercise 3-5 days per week.  Short: Review home exercise guidelines.  Long: Increase home exercise.   Short: Add in two days at home consistently  Long: Start counting pulse some during exercise   Row Name 11/04/17 1512 11/10/17 0932 11/17/17 1546 12/02/17 1532  12/08/17 0935     Exercise Goal Re-Evaluation   Exercise Goals Review  Increase Physical Activity;Understanding of Exercise Prescription;Increase Strength and Stamina  Increase Physical Activity;Understanding of Exercise Prescription;Increase Strength and Stamina  Increase Physical Activity;Understanding of Exercise Prescription;Increase Strength and Stamina  Increase Physical Activity;Understanding of Exercise Prescription;Increase Strength and Stamina  Increase Physical Activity;Understanding of Exercise Prescription;Increase Strength and Stamina   Comments  Zamorah has been doing well in rehab.  She is feeling better now that some of her fluid has started to come off.   She is finally ready to start moving up her workloads and she moved up to level 3 on the NuStep.  We will continue to monitor her progression.   Nekesha continues to do well in rehab.  She has been walking some at home.  She is also starting to feel a little stronger. We will  continue to work with her on building more stamina.   Anabeth has been doing well in rehab.  She is now up to 2.6 METs on the NuStep.  We will continue to work on her progression.   Sarabi has continued to do well in rehab.  She is now up to level 3 on the BioStep.  We will continue to monitor her progress.   Jhanae has returned to rehab.  She was out for a couple weeks with weather and fluid overlaod. She is getting stronger and has more stamina.  She is able to do more around house.  Nneoma is walking on her off days for about 15-20 min.     Expected Outcomes  Short: Continue to increase workloads.  Long: Add in more exercise at home on off days.   Short: Continue to attend rehab to increase stamina.  Long: Continue to exercise at home.  Short: Try to increase her workloads.  Long: Continue to walk at home.   Short: Continue to try to increase her workloads more.  Long: Walk more at home on off days.   Short: Increase time of walking at home to 5mn.  Long: Continue to increase activity levels.    RLa [place]ttesName 12/30/17 1458             Exercise Goal Re-Evaluation   Exercise Goals Review  Increase Physical Activity;Understanding of Exercise Prescription;Increase Strength and Stamina       Comments  [employer]!  She is planning to continue to exercise by walking on her own.        Expected Outcomes  Short: Graduation!  Long: Continue to exercise for 374m regularly.           Discharge Exercise Prescription (Final Exercise Prescription Changes): Exercise Prescription Changes - 12/14/17 1300      Response to Exercise   Blood Pressure (Admit)  114/70    Blood Pressure (Exercise)  122/54    Blood Pressure (Exit)  128/60    Heart Rate (Admit)  69 bpm    Heart Rate (Exercise)  72 bpm    Heart Rate (Exit)  64 bpm    Rating of Perceived Exertion (Exercise)  15    Symptoms  none    Duration  Continue with 45 min of aerobic exercise without signs/symptoms of physical distress.     Intensity  THRR unchanged      Progression   Progression  Continue to progress workloads to maintain intensity without signs/symptoms of physical distress.    Average METs  2.02      Resistance Training  Training Prescription  Yes    Weight  2 lb    Reps  10-15      Interval Training   Interval Training  No      Treadmill   MPH  1.5    Grade  0    Minutes  15    METs  2.15      NuStep   Level  3    Minutes  15    METs  1.9      Biostep-RELP   Level  3    Minutes  15    METs  2      Home Exercise Plan   Plans to continue exercise at  Home (comment) walking    Frequency  Add 2 additional days to program exercise sessions.    Initial Home Exercises Provided  10/27/17       Nutrition:  Target Goals: Understanding of nutrition guidelines, daily intake of sodium <1511m, cholesterol <2049m calories 30% from fat and 7% or less from saturated fats, daily to have 5 or more servings of fruits and vegetables.  Biometrics: Pre Biometrics - 12/10/17 0919      Pre Biometrics   Height  _0  (1.651 m)    Weight  135 lb 8 oz (61.5 kg)    Waist Circumference  29 inches    Hip Circumference  38 inches    Waist to Hip Ratio  0.76 %    BMI (Calculated)  22.55      Post Biometrics - 12/10/17 0920       Post  Biometrics   Height  _1  (1.651 m)    Weight  141 lb (64 kg)    Waist Circumference  29.5 inches    Hip Circumference  38.5 inches    Waist to Hip Ratio  0.77 %    BMI (Calculated)  23.46       Nutrition Therapy Plan and Nutrition Goals: Nutrition Therapy & Goals - 12/08/17 0939      Nutrition Therapy   RD appointment defered  Yes       Nutrition Discharge: Rate Your Plate Scores: Nutrition Assessments - 12/10/17 1021      MEDFICTS Scores   Pre Score  50    Post Score  52    Score Difference  2       Nutrition Goals Re-Evaluation: Nutrition Goals Re-Evaluation    Row Name 10/22/17 1057 11/10/17 0920 12/08/17 0939         Goals   Nutrition  Goal  Meet with dietician  Keep following her current diet.  Keep following her current diet.     Comment  Appt scheduled for 11/6 after class  She missed her appointment with dietician as she did not feel good that day.  She does not want to reschedule and would like to just continue on current diet.  We talked about making sure that she is eating enough and drinking enough.  November continues to decline nutrition appointment.  She attended the nutrtion education class today and found it very helpful.  She found  that the best piece of advice was the poster about what to eat versus what not to eat.  She doesn't have a big appetite.       Expected Outcome  Short: Meet with dietician to help with heart healthy diet.  Long: Follow recommendations  Short: Continue to follow heart and renal diet.  Long: Continue to eat enough to maintain  weight.  Short: Continue to follow heart healthy and renal diet.  Long: Continue to eat to maintain weight.         Nutrition Goals Discharge (Final Nutrition Goals Re-Evaluation): Nutrition Goals Re-Evaluation - 12/08/17 0939      Goals   Nutrition Goal  Keep following her current diet.    Comment  Nalaysia continues to decline nutrition appointment.  She attended the nutrtion education class today and found it very helpful.  She found  that the best piece of advice was the poster about what to eat versus what not to eat.  She doesn't have a big appetite.      Expected Outcome  Short: Continue to follow heart healthy and renal diet.  Long: Continue to eat to maintain weight.        Psychosocial: Target Goals: Acknowledge presence or absence of significant depression and/or stress, maximize coping skills, provide positive support system. Participant is able to verbalize types and ability to use techniques and skills needed for reducing stress and depression.   Initial Review & Psychosocial Screening: Initial Psych Review & Screening - 09/17/17 1405      Initial Review    Current issues with  Current Stress Concerns    Source of Stress Concerns  Chronic Illness;Transportation;Unable to participate in former interests or hobbies    Comments  Her husband has dementia and since her event and surgery he has been moved to [facility] facility which has taken a lot of stress off of her due to not being his sole caregiver. She still misses having him in the home but visits him almost daily. She lives alone and drives herself so that can be a stressor at times due to having intraoccular lens implants. She would like to attend church regularly again but feels she does not want to drive more than she has to and gets too fatigued once she is there. Once she gets some stamina back we discussed having her find a ride to church.      Family Dynamics   Good Support System?  Yes    Comments  Her husband has dementia and is [place], her daughter lives in [place] but will come visit regularly and stay for longer visits to help at times.       Barriers   Psychosocial barriers to participate in program  The patient should benefit from training in stress management and relaxation.      Screening Interventions   Interventions  Yes;Encouraged to exercise;Program counselor consult;Provide feedback about the scores to participant    Expected Outcomes  Short Term goal: Utilizing psychosocial counselor, staff and physician to assist with identification of specific Stressors or current issues interfering with healing process. Setting desired goal for each stressor or current issue identified.;Long Term Goal: Stressors or current issues are controlled or eliminated.;Short Term goal: Identification and review with participant of any Quality of Life or Depression concerns found by scoring the questionnaire.;Long Term goal: The participant improves quality of Life and PHQ9 Scores as seen by post scores and/or verbalization of changes       Quality of Life Scores:  Quality of Life - 12/10/17  1027      Quality of Life Scores   Health/Function Pre  17.1 %    Health/Function Post  18.83 %    Health/Function % Change  10.12 %    Socioeconomic Pre  21.92 %    Socioeconomic Post  21.57 %    Socioeconomic %  Change   -1.6 %    Psych/Spiritual Pre  22.67 %    Psych/Spiritual Post  25.71 %    Psych/Spiritual % Change  13.41 %    Family Pre  24.88 %    Family Post  22.5 %    Family % Change  -9.57 %    GLOBAL Pre  20.11 %    GLOBAL Post  21.35 %    GLOBAL % Change  6.17 %       PHQ-9: Recent Review Flowsheet Data    Depression screen [facility] 2/9 12/10/2017 09/17/2017   Decreased Interest 0 0   Down, Depressed, Hopeless 1 0   PHQ - 2 Score 1 0   Altered sleeping 0 0   Tired, decreased energy 0 1   Change in appetite 0 0   Feeling bad or failure about yourself  0 0   Trouble concentrating 0 1   Moving slowly or fidgety/restless 0 1   Suicidal thoughts 0 0   PHQ-9 Score 1 3   Difficult doing work/chores Not difficult at all Not difficult at all     Interpretation of Total Score  Total Score Depression Severity:  1-4 = Minimal depression, 5-9 = Mild depression, 10-14 = Moderate depression, 15-19 = Moderately severe depression, 20-27 = Severe depression   Psychosocial Evaluation and Intervention: Psychosocial Evaluation - 09/22/17 0933      Psychosocial Evaluation & Interventions   Interventions  Encouraged to exercise with the program and follow exercise prescription    Comments  Counselor met with Ms. Minette [facility]ine [facility]) today for initial psychosocial evaluation.  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  She has a strong support system with a daughter; a sister-in-law; neighbors; and active involvement in her local church community.  Varnika is married but her spouse has severe dementia and lives in a care center.  Yuleidy has diabetes in addition to her heart issues.  She sleeps well and has a good appetite.  Gillie denies a history of depression or anxiety or  any current symptoms and reports typically being in a positive mood.  She does have some stress in her life with her own health and that of her spouse.  Piya has goals to walk better and increase her stamina while in this program.  She will be followed by staff.     Expected Outcomes  Eliane will benefit from consistent exercise to achieve her stated goals.  The educational and psychoeducational components of this program will be helpful in understanding and coping more positively with her heart condition.  Staff will follow.    Continue Psychosocial Services   Follow up required by staff       Psychosocial Re-Evaluation: Psychosocial Re-Evaluation    Row Name 10/22/17 1058 11/10/17 0917 12/08/17 0941 12/10/17 0934       Psychosocial Re-Evaluation   Current issues with  Current Stress Concerns  Current Stress Concerns  Current Stress Concerns  None Identified    Comments  Madai has been doing well in rehab.  She continues to stress over her healthy issues, but overall has remained postive.  She contineus to sleep well and denies symptoms of depression.   Alysa has been doing well in rehab.  She is feeling better after getting her fluid off.  She continues to feel good and staying postivie.  She still has not been back to church afraid of falling.  We talked about getting back to  church and using an usher's arm to get to her seat.  She contiues to sleep good.Corliss Skains has doing well in rehab.  She feels better when her fluid levels are better controlled.  She still has not gone back to church yet.  She is worried about being judged by how she looks.  We talked about not worring about what others think as much and getting herself filled up.  She continues to sleep good and staying postive.   Counselor follow up with [facility] today reporting progress in her goals of feeling stronger and healthier overall since coming into this program.  She continues to sleep well and has a good appetite.  Adalaide was not  "stressed" about the upcoming holidays and remains positive in her outlook.   Counselor commended [employer] for her progress and hard work since coming into this program.      Expected Outcomes  Short: Continue to attend class.  Long: Remain postive and cope with health issues.   Short: Get back to church.  Long: Remain postivie and cope.  Short: Go back to church.  Long: Remain postivie and continue to cope with her heart failure.   Continue exercising and remaining positive     Interventions  -  Encouraged to attend Cardiac Rehabilitation for the exercise  Encouraged to attend Cardiac Rehabilitation for the exercise;Stress management education  -    Continue Psychosocial Services   -  -  Follow up required by staff  -    Comments  -  Afraid of falling when she weak.  Afraid of falling when she walks  -      Initial Review   Source of Stress Concerns  Chronic Illness  Chronic Illness;Unable to participate in former interests or hobbies  Chronic Illness;Unable to participate in former interests or hobbies  -       Psychosocial Discharge (Final Psychosocial Re-Evaluation): Psychosocial Re-Evaluation - 12/10/17 0934      Psychosocial Re-Evaluation   Current issues with  None Identified    Comments  Counselor follow up with [facility] today reporting progress in her goals of feeling stronger and healthier overall since coming into this program.  She continues to sleep well and has a good appetite.  Anacarolina was not "stressed" about the upcoming holidays and remains positive in her outlook.   Counselor commended [employer] for her progress and hard work since coming into this program.      Expected Outcomes  Continue exercising and remaining positive        Vocational Rehabilitation: Provide vocational rehab assistance to qualifying candidates.   Vocational Rehab Evaluation & Intervention: Vocational Rehab - 09/17/17 1423      Initial Vocational Rehab Evaluation & Intervention   Assessment shows need for  Vocational Rehabilitation  No       Education: Education Goals: Education classes will be provided on a variety of topics geared toward better understanding of heart health and risk factor modification. Participant will state understanding/return demonstration of topics presented as noted by education test scores.  Learning Barriers/Preferences: Learning Barriers/Preferences - 09/17/17 1421      Learning Barriers/Preferences   Learning Barriers  Sight;Exercise Concerns Has bilateral intraoccular lens implants, has appointment with optometrist tomorrow.  Does not walk well so has concerns about exercise and being able to do it.     Learning Preferences  None       Education Topics: General Nutrition Guidelines/Fats and Fiber: -Group instruction provided by verbal, written material,  models and posters to present the general guidelines for heart healthy nutrition. Gives an explanation and review of dietary fats and fiber.   Cardiac Rehab from 12/31/2017 in [facility] Cardiac and Pulmonary Rehab  Date  12/08/17  Educator  CR  Instruction Review Code  1- Verbalizes Understanding      Controlling Sodium/Reading Food Labels: -Group verbal and written material supporting the discussion of sodium use in heart healthy nutrition. Review and explanation with models, verbal and written materials for utilization of the food label.   Cardiac Rehab from 12/31/2017 in [facility] Cardiac and Pulmonary Rehab  Date  12/08/17  Educator  CR  Instruction Review Code  1- Verbalizes Understanding      Exercise Physiology & Risk Factors: - Group verbal and written instruction with models to review the exercise physiology of the cardiovascular system and associated critical values. Details cardiovascular disease risk factors and the goals associated with each risk factor.   Aerobic Exercise & Resistance Training: - Gives group verbal and written discussion on the health impact of inactivity. On the components of  aerobic and resistive training programs and the benefits of this training and how to safely progress through these programs.   Cardiac Rehab from 12/31/2017 in [facility] Cardiac and Pulmonary Rehab  Date  10/22/17  Educator  [facility]  Instruction Review Code  1- Verbalizes Understanding      Flexibility, Balance, General Exercise Guidelines: - Provides group verbal and written instruction on the benefits of flexibility and balance training programs. Provides general exercise guidelines with specific guidelines to those with heart or lung disease. Demonstration and skill practice provided.   Cardiac Rehab from 12/31/2017 in [facility] Cardiac and Pulmonary Rehab  Date  12/29/17  Educator  AS  Instruction Review Code  1- Verbalizes Understanding      Stress Management: - Provides group verbal and written instruction about the health risks of elevated stress, cause of high stress, and healthy ways to reduce stress.   Cardiac Rehab from 12/31/2017 in [facility] Cardiac and Pulmonary Rehab  Date  11/03/17  Educator  [facility]  Instruction Review Code  1- Verbalizes Understanding      Depression: - Provides group verbal and written instruction on the correlation between heart/lung disease and depressed mood, treatment options, and the stigmas associated with seeking treatment.   Cardiac Rehab from 12/31/2017 in [facility] Cardiac and Pulmonary Rehab  Date  09/29/17  Educator  [facility]  Instruction Review Code  1- Verbalizes Understanding      Anatomy & Physiology of the Heart: - Group verbal and written instruction and models provide basic cardiac anatomy and physiology, with the coronary electrical and arterial systems. Review of: AMI, Angina, Valve disease, Heart Failure, Cardiac Arrhythmia, Pacemakers, and the ICD.   Cardiac Procedures: - Group verbal and written instruction to review commonly prescribed medications for heart disease. Reviews the medication, class of the drug, and side effects. Includes the steps to properly  store meds and maintain the prescription regimen. (beta blockers and nitrates)   Cardiac Rehab from 12/31/2017 in [facility] Cardiac and Pulmonary Rehab  Date  11/10/17  Educator  SB  Instruction Review Code  1- Verbalizes Understanding      Cardiac Medications I: - Group verbal and written instruction to review commonly prescribed medications for heart disease. Reviews the medication, class of the drug, and side effects. Includes the steps to properly store meds and maintain the prescription regimen.   Cardiac Rehab from 12/31/2017 in [facility] Cardiac and Pulmonary Rehab  Date  11/17/17  Educator  [place]  Instruction Review Code  1- Verbalizes Understanding      Cardiac Medications II: -Group verbal and written instruction to review commonly prescribed medications for heart disease. Reviews the medication, class of the drug, and side effects. (all other drug classes)   Cardiac Rehab from 12/31/2017 in [facility] Cardiac and Pulmonary Rehab  Date  12/31/17 [facility]]  Educator  [facility]  Instruction Review Code  1- Verbalizes Understanding       Go Sex-Intimacy & Heart Disease, Get SMART - Goal Setting: - Group verbal and written instruction through game format to discuss heart disease and the return to sexual intimacy. Provides group verbal and written material to discuss and apply goal setting through the application of the S.M.A.R.T. Method.   Cardiac Rehab from 12/31/2017 in [facility] Cardiac and Pulmonary Rehab  Date  11/10/17  Educator  SB  Instruction Review Code  1- Verbalizes Understanding      Other Matters of the Heart: - Provides group verbal, written materials and models to describe Heart Failure, Angina, Valve Disease, Peripheral Artery Disease, and Diabetes in the realm of heart disease. Includes description of the disease process and treatment options available to the cardiac patient.   Exercise & Equipment Safety: - Individual verbal instruction and demonstration of equipment use and  safety with use of the equipment.   Cardiac Rehab from 12/31/2017 in [facility] Cardiac and Pulmonary Rehab  Date  09/17/17  Educator  [place]  Instruction Review Code  1- Verbalizes Understanding      Infection Prevention: - Provides verbal and written material to individual with discussion of infection control including proper hand washing and proper equipment cleaning during exercise session.   Cardiac Rehab from 12/31/2017 in [facility] Cardiac and Pulmonary Rehab  Date  09/17/17  Educator  [place]  Instruction Review Code  1- Verbalizes Understanding      Falls Prevention: - Provides verbal and written material to individual with discussion of falls prevention and safety.   Cardiac Rehab from 12/31/2017 in [facility] Cardiac and Pulmonary Rehab  Date  09/17/17  Educator  [place]  Instruction Review Code  1- Verbalizes Understanding      Diabetes: - Individual verbal and written instruction to review signs/symptoms of diabetes, desired ranges of glucose level fasting, after meals and with exercise. Acknowledge that pre and post exercise glucose checks will be done for 3 sessions at entry of program.   Cardiac Rehab from 12/31/2017 in [facility] Cardiac and Pulmonary Rehab  Date  09/17/17  Educator  [place]  Instruction Review Code  1- Verbalizes Understanding      Other: -Provides group and verbal instruction on various topics (see comments)   Cardiac Rehab from 12/31/2017 in [facility] Cardiac and Pulmonary Rehab  Date  10/29/17 [know your numbers and risk factors]  Educator  [facility]  Instruction Review Code  1- Verbalizes Understanding       Knowledge Questionnaire Score: Knowledge Questionnaire Score - 12/10/17 1019      Knowledge Questionnaire Score   Pre Score  23/28    Post Score  19/28 results reviewed with pt       Core Components/Risk Factors/Patient Goals at Admission: Personal Goals and Risk Factors at Admission - 09/17/17 1358      Core Components/Risk Factors/Patient Goals on Admission    Weight  Management  Weight Maintenance;Yes    Intervention  Weight Management: Provide education and appropriate resources to help participant work on and attain dietary goals.;Weight Management:  Develop a combined nutrition and exercise program designed to reach desired caloric intake, while maintaining appropriate intake of nutrient and fiber, sodium and fats, and appropriate energy expenditure required for the weight goal.    Admit Weight  135 lb 8 oz (61.5 kg)    Expected Outcomes  Understanding recommendations for meals to include 15-35% energy as protein, 25-35% energy from fat, 35-60% energy from carbohydrates, less than 291m of dietary cholesterol, 20-35 gm of total fiber daily;Understanding of distribution of calorie intake throughout the day with the consumption of 4-5 meals/snacks;Weight Maintenance: Understanding of the daily nutrition guidelines, which includes 25-35% calories from fat, 7% or less cal from saturated fats, less than 2088mcholesterol, less than 1.5gm of sodium, & 5 or more servings of fruits and vegetables daily    Diabetes  Yes    Intervention  Provide education about signs/symptoms and action to take for hypo/hyperglycemia.;Provide education about proper nutrition, including hydration, and aerobic/resistive exercise prescription along with prescribed medications to achieve blood glucose in normal ranges: Fasting glucose 65-99 mg/dL    Expected Outcomes  Short Term: Participant verbalizes understanding of the signs/symptoms and immediate care of hyper/hypoglycemia, proper foot care and importance of medication, aerobic/resistive exercise and nutrition plan for blood glucose control.;Long Term: Attainment of HbA1C < 7%.    Heart Failure  Yes    Intervention  Provide a combined exercise and nutrition program that is supplemented with education, support and counseling about heart failure. Directed toward relieving symptoms such as shortness of breath, decreased exercise tolerance, and  extremity edema.    Expected Outcomes  Improve functional capacity of life;Short term: Attendance in program 2-3 days a week with increased exercise capacity. Reported lower sodium intake. Reported increased fruit and vegetable intake. Reports medication compliance.;Short term: Daily weights obtained and reported for increase. Utilizing diuretic protocols set by physician.;Long term: Adoption of self-care skills and reduction of barriers for early signs and symptoms recognition and intervention leading to self-care maintenance.    Hypertension  Yes    Intervention  Provide education on lifestyle modifcations including regular physical activity/exercise, weight management, moderate sodium restriction and increased consumption of fresh fruit, vegetables, and low fat dairy, alcohol moderation, and smoking cessation.;Monitor prescription use compliance.    Expected Outcomes  Short Term: Continued assessment and intervention until BP is < 140/9016mG in hypertensive participants. < 130/32m83m in hypertensive participants with diabetes, heart failure or chronic kidney disease.;Long Term: Maintenance of blood pressure at goal levels.    Lipids  Yes    Intervention  Provide education and support for participant on nutrition & aerobic/resistive exercise along with prescribed medications to achieve LDL <70mg42mL >40mg.74mExpected Outcomes  Short Term: Participant states understanding of desired cholesterol values and is compliant with medications prescribed. Participant is following exercise prescription and nutrition guidelines.;Long Term: Cholesterol controlled with medications as prescribed, with individualized exercise RX and with personalized nutrition plan. Value goals: LDL < 70mg, 64m> 40 mg.    Personal Goal Other  Yes    Personal Goal  She would like to feel well enough to attend church regularly and not feet poorly or fatigued during.     Intervention  Patient will come to cardiac rehab and work on  building stamina and strength.     Expected Outcomes  She will gain enough strength and stamina to attend church regularly.        Core Components/Risk Factors/Patient Goals Review:  Goals and Risk Factor Review  [place] Name 10/22/17 1052 11/10/17 0902 12/08/17 5329         Core Components/Risk Factors/Patient Goals Review   Personal Goals Review  Weight Management/Obesity;Hypertension;Lipids;Heart Failure;Diabetes  Weight Management/Obesity;Hypertension;Lipids;Heart Failure;Diabetes  Weight Management/Obesity;Hypertension;Lipids;Heart Failure;Diabetes     Review  Anmol has been doing well in rehab.  She continues to have SOB and swelling with her heart failure. She saw the doctor yesterday and they adjusted some of her medications and increased her lasix.  Her weight was thus down some this morning and her swelling starting to improve.  Her blood pressures and blood sugars have been doing well and she monitors them at home.   Giovanni's weight was down 8 lbs today!!  She is starting to feel better after the weight has come off.  No other SOB or other swelling!  Blood pressures have been good and medications are working.  She continues to check blood pressure and sugars at home and are doing well.  Adasia's weight has been up and down with her fluid.  Today she was up to 140lbs.  She is feeling better now that her fluid is off and her breathing has gotten better too.  She did talk with the doctor about taking extra Lasix when needer.  She contines to get good readings for her blood pressures and blood sugars.  Meds are doing well.      Expected Outcomes  Short: Continue to work on swelling in ankles.  Long: Manage heart failure symptoms.   Short: Call doctor to establish plan for preventing fluid overload.  Long: Continue to manage heart failure symptoms.   Short: Continue to watch weight daily.  Long: Continue to manage heart failure.         Core Components/Risk Factors/Patient Goals at Discharge  (Final Review):  Goals and Risk Factor Review - 12/08/17 0937      Core Components/Risk Factors/Patient Goals Review   Personal Goals Review  Weight Management/Obesity;Hypertension;Lipids;Heart Failure;Diabetes    Review  Matie's weight has been up and down with her fluid.  Today she was up to 140lbs.  She is feeling better now that her fluid is off and her breathing has gotten better too.  She did talk with the doctor about taking extra Lasix when needer.  She contines to get good readings for her blood pressures and blood sugars.  Meds are doing well.     Expected Outcomes  Short: Continue to watch weight daily.  Long: Continue to manage heart failure.        ITP Comments: ITP Comments    Row Name 09/17/17 1354 09/30/17 0657 10/20/17 1541 10/28/17 0655 11/25/17 0611   ITP Comments  Med review completed. Initial ITP created. Diagnosis information can be found in [facility] encouter dated 08/19/2017.  30 day Review. Continue with ITP unless directed changes per Medical Director Review.   Return call to pt from this morning. She has been out since last Tuesday with swelling in her ankles. She has an appointment tomorrow with Dr. Haroldine Laws and I encouraged her to show him her ankles. I also encouraged her to return to rehab on Thursday.   30 day review. Continue with ITP unless directed changes per Medical Director review.   30 day review. Continue with ITP unless directed changes per Medical Director review.    [place] Name 12/23/17 0701 12/31/17 1104         ITP Comments  30 day review. Continue with ITP unless directed changes per Medical Director review.  Discharge ITP sent and signed by Dr. Sabra Heck.  Discharge Summary routed to PCP and cardiologist.         Comments: Discharge ITP

## 2017-12-31 NOTE — Progress Notes (Signed)
Discharge Progress Report  Patient Details  Name: [name] [name] MRN: [id number] Date of Birth: 1935/05/02 Referring Provider:     Cardiac Rehab from 09/17/2017 in [facility] Cardiac and Pulmonary Rehab  Referring Provider  Bensimhon       Number of Visits: 36/36  Reason for Discharge:  Patient reached a stable level of exercise. Patient independent in their exercise. Patient has met program and personal goals.  Smoking History:  Social History   Tobacco Use  Smoking Status Never Smoker  Smokeless Tobacco Never Used    Diagnosis:  [name]  Heart failure, chronic systolic (HCC)  ADL UCSD:   Initial Exercise Prescription: Initial Exercise Prescription - 09/17/17 1400      Date of Initial Exercise RX and Referring Provider   Date  09/17/17    Referring Provider  Bensimhon      Treadmill   MPH  1    Grade  0    Minutes  15    METs  1.77      NuStep   Level  1    SPM  80    Minutes  15    METs  1.7      Biostep-RELP   Level  1    SPM  50    Minutes  15    METs  1.7      Track   Laps  15    Minutes  15    METs  1.7      Prescription Details   Frequency (times per week)  3    Duration  Progress to 45 minutes of aerobic exercise without signs/symptoms of physical distress      Intensity   THRR 40-80% of Max Heartrate  105-128    Ratings of Perceived Exertion  11-13    Perceived Dyspnea  0-4      Resistance Training   Training Prescription  Yes    Weight  2 lb    Reps  10-15       Discharge Exercise Prescription (Final Exercise Prescription Changes): Exercise Prescription Changes - 12/14/17 1300      Response to Exercise   Blood Pressure (Admit)  114/70    Blood Pressure (Exercise)  122/54    Blood Pressure (Exit)  128/60    Heart Rate (Admit)  69 bpm    Heart Rate (Exercise)  72 bpm    Heart Rate (Exit)  64 bpm    Rating of Perceived Exertion (Exercise)  15    Symptoms  none    Duration  Continue with 45 min of aerobic exercise  without signs/symptoms of physical distress.    Intensity  THRR unchanged      Progression   Progression  Continue to progress workloads to maintain intensity without signs/symptoms of physical distress.    Average METs  2.02      Resistance Training   Training Prescription  Yes    Weight  2 lb    Reps  10-15      Interval Training   Interval Training  No      Treadmill   MPH  1.5    Grade  0    Minutes  15    METs  2.15      NuStep   Level  3    Minutes  15    METs  1.9      Biostep-RELP   Level  3    Minutes  15  METs  2      Home Exercise Plan   Plans to continue exercise at  Home (comment) walking    Frequency  Add 2 additional days to program exercise sessions.    Initial Home Exercises Provided  10/27/17       Functional Capacity: 6 Minute Walk    Row Name 09/17/17 1429 12/10/17 0921       6 Minute Walk   Phase  Initial  Discharge    Distance  725 feet  1155 feet    Distance % Change  -  59 %    Distance Feet Change  -  430 ft    Walk Time  6 minutes  5.6 minutes    # of Rest Breaks  0  1    MPH  1.37  2.34    METS  1.76  2.2    RPE  11  15    Perceived Dyspnea   -  1    VO2 Peak  6.18  7.7    Symptoms  No  No    Resting HR  82 bpm  67 bpm    Resting BP  136/58  114/70    Resting Oxygen Saturation   -  97 %    Exercise Oxygen Saturation  during 6 min walk  100 %  99 %    Max Ex. HR  116 bpm  98 bpm    Max Ex. BP  134/60  122/54    2 Minute Post BP  134/72  -       Psychological, QOL, Others - Outcomes: PHQ 2/9: Depression screen [facility] 2/9 12/10/2017 09/17/2017  Decreased Interest 0 0  Down, Depressed, Hopeless 1 0  PHQ - 2 Score 1 0  Altered sleeping 0 0  Tired, decreased energy 0 1  Change in appetite 0 0  Feeling bad or failure about yourself  0 0  Trouble concentrating 0 1  Moving slowly or fidgety/restless 0 1  Suicidal thoughts 0 0  PHQ-9 Score 1 3  Difficult doing work/chores Not difficult at all Not difficult at all     Quality of Life: Quality of Life - 12/10/17 1027      Quality of Life Scores   Health/Function Pre  17.1 %    Health/Function Post  18.83 %    Health/Function % Change  10.12 %    Socioeconomic Pre  21.92 %    Socioeconomic Post  21.57 %    Socioeconomic % Change   -1.6 %    Psych/Spiritual Pre  22.67 %    Psych/Spiritual Post  25.71 %    Psych/Spiritual % Change  13.41 %    Family Pre  24.88 %    Family Post  22.5 %    Family % Change  -9.57 %    GLOBAL Pre  20.11 %    GLOBAL Post  21.35 %    GLOBAL % Change  6.17 %       Personal Goals: Goals established at orientation with interventions provided to work toward goal. Personal Goals and Risk Factors at Admission - 09/17/17 1358      Core Components/Risk Factors/Patient Goals on Admission    Weight Management  Weight Maintenance;Yes    Intervention  Weight Management: Provide education and appropriate resources to help participant work on and attain dietary goals.;Weight Management: Develop a combined nutrition and exercise program designed to reach desired caloric intake, while maintaining appropriate intake of  nutrient and fiber, sodium and fats, and appropriate energy expenditure required for the weight goal.    Admit Weight  135 lb 8 oz (61.5 kg)    Expected Outcomes  Understanding recommendations for meals to include 15-35% energy as protein, 25-35% energy from fat, 35-60% energy from carbohydrates, less than 265m of dietary cholesterol, 20-35 gm of total fiber daily;Understanding of distribution of calorie intake throughout the day with the consumption of 4-5 meals/snacks;Weight Maintenance: Understanding of the daily nutrition guidelines, which includes 25-35% calories from fat, 7% or less cal from saturated fats, less than 2045mcholesterol, less than 1.5gm of sodium, & 5 or more servings of fruits and vegetables daily    Diabetes  Yes    Intervention  Provide education about signs/symptoms and action to take for  hypo/hyperglycemia.;Provide education about proper nutrition, including hydration, and aerobic/resistive exercise prescription along with prescribed medications to achieve blood glucose in normal ranges: Fasting glucose 65-99 mg/dL    Expected Outcomes  Short Term: Participant verbalizes understanding of the signs/symptoms and immediate care of hyper/hypoglycemia, proper foot care and importance of medication, aerobic/resistive exercise and nutrition plan for blood glucose control.;Long Term: Attainment of HbA1C < 7%.    Heart Failure  Yes    Intervention  Provide a combined exercise and nutrition program that is supplemented with education, support and counseling about heart failure. Directed toward relieving symptoms such as shortness of breath, decreased exercise tolerance, and extremity edema.    Expected Outcomes  Improve functional capacity of life;Short term: Attendance in program 2-3 days a week with increased exercise capacity. Reported lower sodium intake. Reported increased fruit and vegetable intake. Reports medication compliance.;Short term: Daily weights obtained and reported for increase. Utilizing diuretic protocols set by physician.;Long term: Adoption of self-care skills and reduction of barriers for early signs and symptoms recognition and intervention leading to self-care maintenance.    Hypertension  Yes    Intervention  Provide education on lifestyle modifcations including regular physical activity/exercise, weight management, moderate sodium restriction and increased consumption of fresh fruit, vegetables, and low fat dairy, alcohol moderation, and smoking cessation.;Monitor prescription use compliance.    Expected Outcomes  Short Term: Continued assessment and intervention until BP is < 140/9015mG in hypertensive participants. < 130/53m59m in hypertensive participants with diabetes, heart failure or chronic kidney disease.;Long Term: Maintenance of blood pressure at goal levels.     Lipids  Yes    Intervention  Provide education and support for participant on nutrition & aerobic/resistive exercise along with prescribed medications to achieve LDL <70mg67mL >40mg.33mExpected Outcomes  Short Term: Participant states understanding of desired cholesterol values and is compliant with medications prescribed. Participant is following exercise prescription and nutrition guidelines.;Long Term: Cholesterol controlled with medications as prescribed, with individualized exercise RX and with personalized nutrition plan. Value goals: LDL < 70mg, 57m> 40 mg.    Personal Goal Other  Yes    Personal Goal  She would like to feel well enough to attend church regularly and not feet poorly or fatigued during.     Intervention  Patient will come to cardiac rehab and work on building stamina and strength.     Expected Outcomes  She will gain enough strength and stamina to attend church regularly.         Personal Goals Discharge: Goals and Risk Factor Review    Row Name 10/22/17 1052 11/10/17 0902 12/08/17 0937         Core Components/Risk  Factors/Patient Goals Review   Personal Goals Review  Weight Management/Obesity;Hypertension;Lipids;Heart Failure;Diabetes  Weight Management/Obesity;Hypertension;Lipids;Heart Failure;Diabetes  Weight Management/Obesity;Hypertension;Lipids;Heart Failure;Diabetes     Review  [name] [name] has been doing well in rehab.  She continues to have SOB and swelling with her heart failure. She saw the doctor yesterday and they adjusted some of her medications and increased her lasix.  Her weight was thus down some this morning and her swelling starting to improve.  Her blood pressures and blood sugars have been doing well and she monitors them at home.   [name] [name]'s weight was down 8 lbs today!!  She is starting to feel better after the weight has come off.  No other SOB or other swelling!  Blood pressures have been good and medications are working.  She continues to check blood  pressure and sugars at home and are doing well.  [name] [name]'s weight has been up and down with her fluid.  Today she was up to 140lbs.  She is feeling better now that her fluid is off and her breathing has gotten better too.  She did talk with the doctor about taking extra Lasix when needer.  She contines to get good readings for her blood pressures and blood sugars.  Meds are doing well.      Expected Outcomes  Short: Continue to work on swelling in ankles.  Long: Manage heart failure symptoms.   Short: Call doctor to establish plan for preventing fluid overload.  Long: Continue to manage heart failure symptoms.   Short: Continue to watch weight daily.  Long: Continue to manage heart failure.         Exercise Goals and Review: Exercise Goals    Row Name 09/17/17 1427             Exercise Goals   Increase Physical Activity  Yes       Intervention  Provide advice, education, support and counseling about physical activity/exercise needs.;Develop an individualized exercise prescription for aerobic and resistive training based on initial evaluation findings, risk stratification, comorbidities and participant's personal goals.       Expected Outcomes  Achievement of increased cardiorespiratory fitness and enhanced flexibility, muscular endurance and strength shown through measurements of functional capacity and personal statement of participant.       Increase Strength and Stamina  Yes       Intervention  Provide advice, education, support and counseling about physical activity/exercise needs.;Develop an individualized exercise prescription for aerobic and resistive training based on initial evaluation findings, risk stratification, comorbidities and participant's personal goals.       Expected Outcomes  Achievement of increased cardiorespiratory fitness and enhanced flexibility, muscular endurance and strength shown through measurements of functional capacity and personal statement of participant.        Able to understand and use rate of perceived exertion (RPE) scale  Yes       Intervention  Provide education and explanation on how to use RPE scale       Expected Outcomes  Short Term: Able to use RPE daily in rehab to express subjective intensity level;Long Term:  Able to use RPE to guide intensity level when exercising independently       Able to understand and use Dyspnea scale  Yes       Intervention  Provide education and explanation on how to use Dyspnea scale       Expected Outcomes  Short Term: Able to use Dyspnea scale daily in rehab to express subjective sense of  shortness of breath during exertion;Long Term: Able to use Dyspnea scale to guide intensity level when exercising independently       Knowledge and understanding of Target Heart Rate Range (THRR)  Yes       Intervention  Provide education and explanation of THRR including how the numbers were predicted and where they are located for reference       Expected Outcomes  Short Term: Able to state/look up THRR;Long Term: Able to use THRR to govern intensity when exercising independently;Short Term: Able to use daily as guideline for intensity in rehab       Able to check pulse independently  Yes       Intervention  Provide education and demonstration on how to check pulse in carotid and radial arteries.;Review the importance of being able to check your own pulse for safety during independent exercise       Expected Outcomes  Short Term: Able to explain why pulse checking is important during independent exercise;Long Term: Able to check pulse independently and accurately       Understanding of Exercise Prescription  Yes       Intervention  Provide education, explanation, and written materials on patient's individual exercise prescription       Expected Outcomes  Short Term: Able to explain program exercise prescription;Long Term: Able to explain home exercise prescription to exercise independently          Nutrition & Weight -  Outcomes: Pre Biometrics - 12/10/17 0919      Pre Biometrics   Height  5' 5" (1.651 m)    Weight  135 lb 8 oz (61.5 kg)    Waist Circumference  29 inches    Hip Circumference  38 inches    Waist to Hip Ratio  0.76 %    BMI (Calculated)  22.55      Post Biometrics - 12/10/17 0920       Post  Biometrics   Height  5' 5" (1.651 m)    Weight  141 lb (64 kg)    Waist Circumference  29.5 inches    Hip Circumference  38.5 inches    Waist to Hip Ratio  0.77 %    BMI (Calculated)  23.46       Nutrition: Nutrition Therapy & Goals - 12/08/17 0939      Nutrition Therapy   RD appointment defered  Yes       Nutrition Discharge: Nutrition Assessments - 12/10/17 1021      MEDFICTS Scores   Pre Score  50    Post Score  52    Score Difference  2       Education Questionnaire Score: Knowledge Questionnaire Score - 12/10/17 1019      Knowledge Questionnaire Score   Pre Score  23/28    Post Score  19/28 results reviewed with pt       Goals reviewed with patient; copy given to patient.

## 2017-12-31 NOTE — Progress Notes (Signed)
Daily Session Note  Patient Details  Name: [name] [name] MRN: [id number] Date of Birth: [birth year] Referring Provider:     Cardiac Rehab from 09/17/2017 in [facility] Cardiac and Pulmonary Rehab  Referring Provider  Bensimhon      Encounter Date: 12/31/2017  Check In: Session Check In - 12/31/17 0839      Check-In   Location  [facility]-Cardiac & Pulmonary Rehab    Staff Present  Nada Maclachlan, BA, ACSM CEP, Exercise Physiologist;Carroll Enterkin, RN, Levie Heritage, MA, ACSM RCEP, Exercise Physiologist    Supervising physician immediately available to respond to emergencies  See telemetry face sheet for immediately available ER MD    Medication changes reported      No    Fall or balance concerns reported     No    Warm-up and Cool-down  Performed on first and last piece of equipment    Resistance Training Performed  Yes    VAD Patient?  No      Pain Assessment   Currently in Pain?  No/denies    Multiple Pain Sites  No          Social History   Tobacco Use  Smoking Status Never Smoker  Smokeless Tobacco Never Used    Goals Met:  Independence with exercise equipment Exercise tolerated well No report of cardiac concerns or symptoms Strength training completed today  Goals Unmet:  Not Applicable  Comments:  [name] [name] graduated today from cardiac rehab with 36 sessions completed.  Details of the patient's exercise prescription and what She needs to do in order to continue the prescription and progress were discussed with patient.  Patient was given a copy of prescription and goals.  Patient verbalized understanding.  [name] [name] plans to continue to exercise by joining [employer].    Dr. Emily Filbert is Medical Director for [facility].

## 2018-01-01 DIAGNOSIS — N200 Calculus of kidney: Secondary | ICD-10-CM | POA: Diagnosis not present

## 2018-01-01 DIAGNOSIS — E1129 Type 2 diabetes mellitus with other diabetic kidney complication: Secondary | ICD-10-CM | POA: Diagnosis not present

## 2018-01-01 DIAGNOSIS — I5022 Chronic systolic (congestive) heart failure: Secondary | ICD-10-CM | POA: Diagnosis not present

## 2018-01-01 DIAGNOSIS — N184 Chronic kidney disease, stage 4 (severe): Secondary | ICD-10-CM | POA: Diagnosis not present

## 2018-01-01 DIAGNOSIS — R809 Proteinuria, unspecified: Secondary | ICD-10-CM | POA: Diagnosis not present

## 2018-01-18 ENCOUNTER — Ambulatory Visit (HOSPITAL_COMMUNITY)
Admission: RE | Admit: 2018-01-18 | Discharge: 2018-01-18 | Disposition: A | Discharge status: Home / Self Care | Payer: Medicare HMO | Source: Ambulatory Visit | Attending: Internal Medicine | Admitting: Internal Medicine

## 2018-01-18 ENCOUNTER — Encounter (HOSPITAL_COMMUNITY): Payer: Self-pay | Admitting: Internal Medicine

## 2018-01-18 ENCOUNTER — Other Ambulatory Visit: Payer: Self-pay

## 2018-01-18 VITALS — BP 160/80 | HR 69 | Wt 144.8 lb

## 2018-01-18 DIAGNOSIS — R059 Cough, unspecified: Secondary | ICD-10-CM

## 2018-01-18 DIAGNOSIS — I480 Paroxysmal atrial fibrillation: Secondary | ICD-10-CM | POA: Insufficient documentation

## 2018-01-18 DIAGNOSIS — I509 Heart failure, unspecified: Secondary | ICD-10-CM | POA: Diagnosis not present

## 2018-01-18 DIAGNOSIS — H42 Glaucoma in diseases classified elsewhere: Secondary | ICD-10-CM | POA: Diagnosis not present

## 2018-01-18 DIAGNOSIS — Z951 Presence of aortocoronary bypass graft: Secondary | ICD-10-CM

## 2018-01-18 DIAGNOSIS — I517 Cardiomegaly: Secondary | ICD-10-CM | POA: Insufficient documentation

## 2018-01-18 DIAGNOSIS — I2510 Atherosclerotic heart disease of native coronary artery without angina pectoris: Secondary | ICD-10-CM | POA: Insufficient documentation

## 2018-01-18 DIAGNOSIS — Z953 Presence of xenogenic heart valve: Secondary | ICD-10-CM | POA: Insufficient documentation

## 2018-01-18 DIAGNOSIS — E1139 Type 2 diabetes mellitus with other diabetic ophthalmic complication: Secondary | ICD-10-CM | POA: Insufficient documentation

## 2018-01-18 DIAGNOSIS — I501 Left ventricular failure: Secondary | ICD-10-CM | POA: Diagnosis not present

## 2018-01-18 DIAGNOSIS — E1122 Type 2 diabetes mellitus with diabetic chronic kidney disease: Secondary | ICD-10-CM | POA: Diagnosis not present

## 2018-01-18 DIAGNOSIS — I25118 Atherosclerotic heart disease of native coronary artery with other forms of angina pectoris: Secondary | ICD-10-CM

## 2018-01-18 DIAGNOSIS — N184 Chronic kidney disease, stage 4 (severe): Secondary | ICD-10-CM | POA: Diagnosis not present

## 2018-01-18 DIAGNOSIS — I878 Other specified disorders of veins: Secondary | ICD-10-CM | POA: Diagnosis not present

## 2018-01-18 DIAGNOSIS — Z952 Presence of prosthetic heart valve: Secondary | ICD-10-CM

## 2018-01-18 DIAGNOSIS — I252 Old myocardial infarction: Secondary | ICD-10-CM | POA: Diagnosis not present

## 2018-01-18 DIAGNOSIS — Z79899 Other long term (current) drug therapy: Secondary | ICD-10-CM | POA: Insufficient documentation

## 2018-01-18 DIAGNOSIS — H409 Unspecified glaucoma: Secondary | ICD-10-CM | POA: Diagnosis not present

## 2018-01-18 DIAGNOSIS — E785 Hyperlipidemia, unspecified: Secondary | ICD-10-CM | POA: Insufficient documentation

## 2018-01-18 DIAGNOSIS — R05 Cough: Secondary | ICD-10-CM | POA: Diagnosis not present

## 2018-01-18 DIAGNOSIS — I130 Hypertensive heart and chronic kidney disease with heart failure and stage 1 through stage 4 chronic kidney disease, or unspecified chronic kidney disease: Secondary | ICD-10-CM | POA: Insufficient documentation

## 2018-01-18 DIAGNOSIS — I5022 Chronic systolic (congestive) heart failure: Secondary | ICD-10-CM | POA: Diagnosis not present

## 2018-01-18 DIAGNOSIS — Z7982 Long term (current) use of aspirin: Secondary | ICD-10-CM | POA: Insufficient documentation

## 2018-01-18 DIAGNOSIS — Z9889 Other specified postprocedural states: Secondary | ICD-10-CM | POA: Diagnosis not present

## 2018-01-18 MED ORDER — HYDRALAZINE HCL 50 MG PO TABS: 50.0000 mg | ORAL_TABLET | Freq: Two times a day (BID) | ORAL | 3 refills | Status: DC

## 2018-01-18 MED ORDER — FUROSEMIDE 40 MG PO TABS: ORAL_TABLET | ORAL | 3 refills | Status: DC

## 2018-01-18 NOTE — Progress Notes (Signed)
Advanced Heart Failure Clinic Note   PCP: Dr. Ouida Sills Primary Cardiologist: Bensimhon  HPI:  Ms. Smejkal is a 83 year old female with a history of type 2 diabetes mellitus, hypertension, hyperlipidemia and CKD.   Admiited on 02/03/2017 with NSTEMI. Emergent cardiac catheterization which showed three-vessel coronary artery disease. She developed profound shock with multi-system organ failure and found to have severe MR due to partial flail of MV. Underwent emergent CABG x 4 (Coronary artery bypass grafting x 4   Left internal mammary graft to the LAD  Sequential SVG to diagonal and OM  SVG to PDA and bioprosthetic MVR   Had prolonged post-op course with renal failure, PAF (resolved with amio), paralyzed left vocal cord and HF. Post-op Echo with EF 40% RV normal, and stable MVR. ECHO 3/4 EF 20-25% RV mild-moderately reduced. Trivial pericardial effusion.   On d/c was 152 pounds.  Cr 2.3  She presents today for routine follow up. Just finished CR program at [facility] this month. She said it was great. She is much stronger. Can now do ADLs without problem. Still has problems with edema. Denies orthopnea or PND. No CP. Weight is up.  Weight at CR was 133. Weight 144 today. Says Dr. Candiss Norse (nehprology) put her on 80 lasix but then dropped it back down to 40. Creatinine ranges 2.5-3.3 . Taking al meds as prescribed.  Echo 8/18. EF ~25- 30% MVR stable   Review of systems complete and found to be negative unless listed in HPI.    SH:  Social History   Socioeconomic History  . Marital status: Married    Spouse name: Not on file  . Number of children: Not on file  . Years of education: Not on file  . Highest education level: Not on file  Social Needs  . Financial resource strain: Not on file  . Food insecurity - worry: Not on file  . Food insecurity - inability: Not on file  . Transportation needs - medical: Not on file  . Transportation needs - non-medical: Not on file  Occupational  History  . Not on file  Tobacco Use  . Smoking status: Never Smoker  . Smokeless tobacco: Never Used  Substance and Sexual Activity  . Alcohol use: No  . Drug use: No  . Sexual activity: Not on file  Other Topics Concern  . Not on file  Social History Narrative  . Not on file   FH:  Family History  Problem Relation Age of Onset  . Diabetes Father   . Diabetes Sister   . Bladder Cancer Neg Hx   . Kidney cancer Neg Hx   . Prostate cancer Neg Hx    Past Medical History:  Diagnosis Date  . [name]    a. LHC 02/03/17 for unstable angina showed severe 3v CAD w/ sequential 70% & 80% p-mLAD, 99% mLCx, CTO mRCA, elevated LV filling pressure  . Colon polyp 2010  . Diabetes mellitus with complication ([place])   . Glaucoma   . Hypertension     Current Outpatient Medications  Medication Sig Dispense Refill  . acetaminophen (TYLENOL) 500 MG tablet Take 500 mg by mouth every 6 (six) hours as needed for mild pain.     Marland Kitchen aspirin EC 81 MG tablet Take 1 tablet (81 mg total) by mouth daily. 90 tablet 3  . carvedilol (COREG) 3.125 MG tablet TAKE 1 TABLET BY MOUTH TWICE DAILY 180 tablet 3  . furosemide (LASIX) 40 MG tablet Take 40  mg by mouth daily.    . hydrALAZINE (APRESOLINE) 50 MG tablet Take 0.5 tablets (25 mg total) by mouth 2 (two) times daily. HOLD IF systolic BP <619 60 tablet 0  . isosorbide mononitrate (IMDUR) 30 MG 24 hr tablet TAKE 1 TABLET BY MOUTH ONCE DAILY 90 tablet 3  . latanoprost (XALATAN) 0.005 % ophthalmic solution Place 1 drop into both eyes at bedtime.     . Potassium Chloride ER 20 MEQ TBCR Take 20 mEq by mouth daily. 30 tablet 3  . rosuvastatin (CRESTOR) 40 MG tablet Take 1 tablet (40 mg total) by mouth daily. 90 tablet 3  . timolol (BETIMOL) 0.5 % ophthalmic solution Place 1 drop into both eyes 2 (two) times daily.     No current facility-administered medications for this encounter.    Vitals:   01/18/18 1029  BP: (!) 160/80  Pulse: 69  SpO2: 100%    Weight: 144 lb 12 oz (65.7 kg)   Wt Readings from Last 3 Encounters:  01/18/18 144 lb 12 oz (65.7 kg)  12/24/17 130 lb (59 kg)  12/10/17 141 lb (64 kg)    PHYSICAL EXAM:  General:  Elderly Well appearing. No resp difficulty HEENT: normal Neck: supple. JVP 8-9 + prominent CV waves. Carotids 2+ bilat; no bruits. No lymphadenopathy or thryomegaly appreciated. Cor: PMI nondisplaced. Regular rate & rhythm. No rubs, gallops or murmurs. Lungs: clear Abdomen: soft, nontender, nondistended. No hepatosplenomegaly. No bruits or masses. Good bowel sounds. Extremities: no cyanosis, clubbing, rash, 1+ edema Neuro: alert & orientedx3, cranial nerves grossly intact. moves all 4 extremities w/o difficulty. Affect pleasant  ASSESSMENT & PLAN:  1. Chronic systolic HF :  - Echo 04/931 LVEF 25-30%, MVR stable.  - Functional status improved with CR. Now NYHA II-early III. Encouraged her to sign up for Maintenance Pogram  - Volume status elevated in setting of recent drop in lasix dose. REDs VEST = 35% - Increased lasix to alternate 80 daily with 40 daily - No spiro with severe CKD.  - Increase hydralazine 50 mg BID.  Will leave room for renal perfusion so will not increase further at this time.  - Continue imdur 30 mg daily.  - Continue carvedilol 3.125 bid - Renal function prohibits Entresto initiation  - Have previously disussed ICD but have decided to defer given age, improving EF and comorbidities - particulary CKD 4 with possible need for HD. - Reinforced fluid restriction to < 2 L daily, sodium restriction to less than 2000 mg daily, and the importance of daily weights.   2. CKD stage 4 - BMET today and 1 week. Watch renal function with increased lasix dose - Follows with Nephrology 3. NSTEMI with CAD [name] with bioprosthetic MVR on 02/05/17 - No s/s ischemia - Continue ECASA 81.  - Continue b-blocker and statin - Refer back to cardiac rehab maintenance program  at [facility]  4. Severe MR  with flail segment s/p MVR c/ bioprosthetic valve. - Valve stable on recent echo.  - Reviewed need for SBE prophylaxis with any dental procedures.  5. PAF, post-op - NSR on exam.  - She is off amio.  - Now off coumadin. Continue ASA 80 mg daily.  6. DM2 - Per PCP.   Total time spent 35 minutes. Over half that time spent discussing above.   Glori Bickers, MD  10:47 AM

## 2018-01-18 NOTE — Patient Instructions (Signed)
INCREASE Hydralazine to 50 mg (1 Tablet) Twice Daily  INCREASE lasix to 40 mg Daily, alternating with 80 mg Daily.  Chest Xray today  Labs in 1 week (bmet)  Follow up in 3 Months

## 2018-01-19 DIAGNOSIS — R319 Hematuria, unspecified: Secondary | ICD-10-CM | POA: Diagnosis not present

## 2018-01-19 DIAGNOSIS — I5022 Chronic systolic (congestive) heart failure: Secondary | ICD-10-CM | POA: Diagnosis not present

## 2018-01-19 DIAGNOSIS — N390 Urinary tract infection, site not specified: Secondary | ICD-10-CM | POA: Diagnosis not present

## 2018-01-19 DIAGNOSIS — N281 Cyst of kidney, acquired: Secondary | ICD-10-CM | POA: Diagnosis not present

## 2018-01-19 DIAGNOSIS — R809 Proteinuria, unspecified: Secondary | ICD-10-CM | POA: Diagnosis not present

## 2018-01-19 DIAGNOSIS — N184 Chronic kidney disease, stage 4 (severe): Secondary | ICD-10-CM | POA: Diagnosis not present

## 2018-01-19 DIAGNOSIS — I10 Essential (primary) hypertension: Secondary | ICD-10-CM | POA: Diagnosis not present

## 2018-01-19 DIAGNOSIS — I272 Pulmonary hypertension, unspecified: Secondary | ICD-10-CM | POA: Diagnosis not present

## 2018-01-19 DIAGNOSIS — R600 Localized edema: Secondary | ICD-10-CM | POA: Diagnosis not present

## 2018-01-19 DIAGNOSIS — E1129 Type 2 diabetes mellitus with other diabetic kidney complication: Secondary | ICD-10-CM | POA: Diagnosis not present

## 2018-01-25 ENCOUNTER — Other Ambulatory Visit (HOSPITAL_COMMUNITY): Payer: Medicare Other

## 2018-01-29 ENCOUNTER — Other Ambulatory Visit (HOSPITAL_COMMUNITY): Payer: Medicare Other

## 2018-02-17 DIAGNOSIS — I10 Essential (primary) hypertension: Secondary | ICD-10-CM | POA: Diagnosis not present

## 2018-02-17 DIAGNOSIS — E785 Hyperlipidemia, unspecified: Secondary | ICD-10-CM | POA: Diagnosis not present

## 2018-02-17 DIAGNOSIS — I5022 Chronic systolic (congestive) heart failure: Secondary | ICD-10-CM | POA: Insufficient documentation

## 2018-02-17 DIAGNOSIS — N183 Chronic kidney disease, stage 3 (moderate): Secondary | ICD-10-CM | POA: Diagnosis not present

## 2018-02-17 DIAGNOSIS — I480 Paroxysmal atrial fibrillation: Secondary | ICD-10-CM | POA: Diagnosis not present

## 2018-02-17 DIAGNOSIS — I2510 Atherosclerotic heart disease of native coronary artery without angina pectoris: Secondary | ICD-10-CM | POA: Diagnosis not present

## 2018-02-17 DIAGNOSIS — E1122 Type 2 diabetes mellitus with diabetic chronic kidney disease: Secondary | ICD-10-CM | POA: Diagnosis not present

## 2018-02-17 DIAGNOSIS — E1149 Type 2 diabetes mellitus with other diabetic neurological complication: Secondary | ICD-10-CM | POA: Diagnosis not present

## 2018-02-18 ENCOUNTER — Telehealth (HOSPITAL_COMMUNITY): Payer: Self-pay | Admitting: *Deleted

## 2018-02-18 DIAGNOSIS — I5022 Chronic systolic (congestive) heart failure: Secondary | ICD-10-CM

## 2018-02-18 NOTE — Telephone Encounter (Signed)
Advanced Heart Failure Triage Encounter  Patient Name: [name] [name]  Date of Call: 02/18/18  Problem: wt gain/sob  Patient called stating she is having shortness of breath with activity since last Thursday.  Her ankles are swelling and her weight yesterday was 152 lbs. She stated she went to see her PCP yesterday and he ordered for her to start Torsemide 20 mg Daily.  Patient is currently taking lasix 80 mg daily alternating with 40 mg daily.    Plan:  I spoke with Barrington Ellison, PA patient should not take torsemide daily.  He advises patient to take 80 mg of lasix for the next two mornings and come in for bmet next week. Patient is agreeable with plan, lab appt scheduled and bmet ordered. Patient verbalized she will not take torsemide 20 mg Daily.   Darron Doom, RN

## 2018-02-24 ENCOUNTER — Ambulatory Visit (HOSPITAL_COMMUNITY)
Admission: RE | Admit: 2018-02-24 | Discharge: 2018-02-24 | Disposition: A | Discharge status: Home / Self Care | Payer: Medicare HMO | Source: Ambulatory Visit | Attending: Internal Medicine | Admitting: Internal Medicine

## 2018-02-24 DIAGNOSIS — I5022 Chronic systolic (congestive) heart failure: Secondary | ICD-10-CM | POA: Diagnosis not present

## 2018-02-24 LAB — BASIC METABOLIC PANEL
Anion gap: 12 (ref 5–15)
BUN: 30 mg/dL — ABNORMAL HIGH (ref 6–20)
CALCIUM: 9.2 mg/dL (ref 8.9–10.3)
CO2: 24 mmol/L (ref 22–32)
CREATININE: 2.38 mg/dL — AB (ref 0.44–1.00)
Chloride: 107 mmol/L (ref 101–111)
GFR, EST AFRICAN AMERICAN: 21 mL/min — AB (ref >60)
GFR, EST NON AFRICAN AMERICAN: 18 mL/min — AB (ref >60)
Glucose, Bld: 168 mg/dL — ABNORMAL HIGH (ref 65–99)
Potassium: 3.4 mmol/L — ABNORMAL LOW (ref 3.5–5.1)
SODIUM: 143 mmol/L (ref 135–145)

## 2018-03-02 ENCOUNTER — Ambulatory Visit
Admission: RE | Admit: 2018-03-02 | Discharge: 2018-03-02 | Disposition: A | Discharge status: Home / Self Care | Payer: Medicare HMO | Source: Ambulatory Visit | Attending: Internal Medicine | Admitting: Internal Medicine

## 2018-03-02 ENCOUNTER — Other Ambulatory Visit
Admission: RE | Admit: 2018-03-02 | Discharge: 2018-03-02 | Disposition: A | Discharge status: Home / Self Care | Payer: Medicare HMO | Source: Ambulatory Visit | Attending: Internal Medicine | Admitting: Internal Medicine

## 2018-03-02 ENCOUNTER — Other Ambulatory Visit: Payer: Self-pay | Admitting: Internal Medicine

## 2018-03-02 DIAGNOSIS — R7989 Other specified abnormal findings of blood chemistry: Secondary | ICD-10-CM | POA: Diagnosis not present

## 2018-03-02 DIAGNOSIS — I2510 Atherosclerotic heart disease of native coronary artery without angina pectoris: Secondary | ICD-10-CM | POA: Insufficient documentation

## 2018-03-02 DIAGNOSIS — N183 Chronic kidney disease, stage 3 (moderate): Secondary | ICD-10-CM | POA: Insufficient documentation

## 2018-03-02 DIAGNOSIS — I5022 Chronic systolic (congestive) heart failure: Secondary | ICD-10-CM | POA: Diagnosis not present

## 2018-03-02 DIAGNOSIS — E1122 Type 2 diabetes mellitus with diabetic chronic kidney disease: Secondary | ICD-10-CM | POA: Insufficient documentation

## 2018-03-02 DIAGNOSIS — R0602 Shortness of breath: Secondary | ICD-10-CM | POA: Diagnosis not present

## 2018-03-02 DIAGNOSIS — E1149 Type 2 diabetes mellitus with other diabetic neurological complication: Secondary | ICD-10-CM | POA: Insufficient documentation

## 2018-03-02 DIAGNOSIS — D649 Anemia, unspecified: Secondary | ICD-10-CM | POA: Diagnosis not present

## 2018-03-02 DIAGNOSIS — J449 Chronic obstructive pulmonary disease, unspecified: Secondary | ICD-10-CM | POA: Diagnosis not present

## 2018-03-02 DIAGNOSIS — R791 Abnormal coagulation profile: Secondary | ICD-10-CM | POA: Diagnosis not present

## 2018-03-02 DIAGNOSIS — I10 Essential (primary) hypertension: Secondary | ICD-10-CM | POA: Diagnosis not present

## 2018-03-02 DIAGNOSIS — E785 Hyperlipidemia, unspecified: Secondary | ICD-10-CM | POA: Diagnosis not present

## 2018-03-02 DIAGNOSIS — I480 Paroxysmal atrial fibrillation: Secondary | ICD-10-CM | POA: Diagnosis not present

## 2018-03-02 LAB — FIBRIN DERIVATIVES D-DIMER (ARMC ONLY): FIBRIN DERIVATIVES D-DIMER (ARMC): 771.14 ng{FEU}/mL — AB (ref 0.00–499.00)

## 2018-03-02 LAB — TROPONIN I

## 2018-03-02 MED ORDER — TECHNETIUM TO 99M ALBUMIN AGGREGATED
4.2400 | Freq: Once | INTRAVENOUS | Status: AC | PRN
Start: 2018-03-02 — End: 2018-03-02
  Administered 2018-03-02: 4.24 via INTRAVENOUS

## 2018-03-02 MED ORDER — TECHNETIUM TC 99M DIETHYLENETRIAME-PENTAACETIC ACID
34.8200 | Freq: Once | INTRAVENOUS | Status: AC | PRN
  Administered 2018-03-02: 34.82 via RESPIRATORY_TRACT

## 2018-03-05 ENCOUNTER — Encounter (HOSPITAL_COMMUNITY): Payer: Self-pay | Admitting: Cardiology

## 2018-03-05 DIAGNOSIS — I272 Pulmonary hypertension, unspecified: Secondary | ICD-10-CM | POA: Diagnosis not present

## 2018-03-05 DIAGNOSIS — R600 Localized edema: Secondary | ICD-10-CM | POA: Diagnosis not present

## 2018-03-05 DIAGNOSIS — N184 Chronic kidney disease, stage 4 (severe): Secondary | ICD-10-CM | POA: Diagnosis not present

## 2018-03-05 DIAGNOSIS — E1129 Type 2 diabetes mellitus with other diabetic kidney complication: Secondary | ICD-10-CM | POA: Diagnosis not present

## 2018-03-05 NOTE — Progress Notes (Signed)
Letter mailed

## 2018-03-11 DIAGNOSIS — I2510 Atherosclerotic heart disease of native coronary artery without angina pectoris: Secondary | ICD-10-CM | POA: Diagnosis not present

## 2018-03-11 DIAGNOSIS — R0602 Shortness of breath: Secondary | ICD-10-CM | POA: Diagnosis not present

## 2018-03-11 DIAGNOSIS — E1122 Type 2 diabetes mellitus with diabetic chronic kidney disease: Secondary | ICD-10-CM | POA: Diagnosis not present

## 2018-03-11 DIAGNOSIS — N183 Chronic kidney disease, stage 3 (moderate): Secondary | ICD-10-CM | POA: Diagnosis not present

## 2018-03-12 DIAGNOSIS — I5022 Chronic systolic (congestive) heart failure: Secondary | ICD-10-CM | POA: Diagnosis not present

## 2018-03-12 DIAGNOSIS — I2510 Atherosclerotic heart disease of native coronary artery without angina pectoris: Secondary | ICD-10-CM | POA: Diagnosis not present

## 2018-03-17 DIAGNOSIS — H401131 Primary open-angle glaucoma, bilateral, mild stage: Secondary | ICD-10-CM | POA: Diagnosis not present

## 2018-03-18 DIAGNOSIS — N183 Chronic kidney disease, stage 3 (moderate): Secondary | ICD-10-CM | POA: Diagnosis not present

## 2018-03-18 DIAGNOSIS — N184 Chronic kidney disease, stage 4 (severe): Secondary | ICD-10-CM | POA: Diagnosis not present

## 2018-03-18 DIAGNOSIS — I2510 Atherosclerotic heart disease of native coronary artery without angina pectoris: Secondary | ICD-10-CM | POA: Diagnosis not present

## 2018-03-18 DIAGNOSIS — I5022 Chronic systolic (congestive) heart failure: Secondary | ICD-10-CM | POA: Diagnosis not present

## 2018-03-18 DIAGNOSIS — I480 Paroxysmal atrial fibrillation: Secondary | ICD-10-CM | POA: Diagnosis not present

## 2018-03-18 DIAGNOSIS — Z951 Presence of aortocoronary bypass graft: Secondary | ICD-10-CM | POA: Diagnosis not present

## 2018-03-18 DIAGNOSIS — E785 Hyperlipidemia, unspecified: Secondary | ICD-10-CM | POA: Diagnosis not present

## 2018-03-18 DIAGNOSIS — I10 Essential (primary) hypertension: Secondary | ICD-10-CM | POA: Diagnosis not present

## 2018-03-18 DIAGNOSIS — R011 Cardiac murmur, unspecified: Secondary | ICD-10-CM | POA: Diagnosis not present

## 2018-03-18 DIAGNOSIS — E1122 Type 2 diabetes mellitus with diabetic chronic kidney disease: Secondary | ICD-10-CM | POA: Diagnosis not present

## 2018-03-23 DIAGNOSIS — H401122 Primary open-angle glaucoma, left eye, moderate stage: Secondary | ICD-10-CM | POA: Diagnosis not present

## 2018-03-25 ENCOUNTER — Ambulatory Visit: Payer: Medicare HMO

## 2018-04-02 ENCOUNTER — Other Ambulatory Visit (HOSPITAL_COMMUNITY): Payer: Self-pay | Admitting: Internal Medicine

## 2018-04-05 ENCOUNTER — Ambulatory Visit
Admission: RE | Admit: 2018-04-05 | Discharge: 2018-04-05 | Disposition: A | Discharge status: Home / Self Care | Payer: Medicare HMO | Source: Ambulatory Visit | Attending: Urology | Admitting: Urology

## 2018-04-05 DIAGNOSIS — N281 Cyst of kidney, acquired: Secondary | ICD-10-CM | POA: Insufficient documentation

## 2018-04-05 DIAGNOSIS — N200 Calculus of kidney: Secondary | ICD-10-CM | POA: Insufficient documentation

## 2018-04-12 ENCOUNTER — Telehealth: Payer: Self-pay | Admitting: Urology

## 2018-04-12 NOTE — Telephone Encounter (Signed)
Patient has a follow up app with Budzyn on 04-15-18 but I have to cx and reschd. She was coming in for her RUS results, can you take a look at them and see if this is something we can give over the phone or if she has to follow up with another provider?  Thanks, Sharyn Lull

## 2018-04-12 NOTE — Telephone Encounter (Signed)
In office follow-up needed to discuss how to follow this cyst.  Nonurgent.  Hollice Espy, MD

## 2018-04-15 ENCOUNTER — Ambulatory Visit: Payer: Medicare HMO

## 2018-04-15 ENCOUNTER — Encounter: Payer: Self-pay | Admitting: Urology

## 2018-04-21 ENCOUNTER — Encounter (HOSPITAL_COMMUNITY): Payer: Medicare Other | Admitting: Internal Medicine

## 2018-04-21 ENCOUNTER — Telehealth (HOSPITAL_COMMUNITY): Payer: Self-pay | Admitting: Vascular Surgery

## 2018-04-21 DIAGNOSIS — M5134 Other intervertebral disc degeneration, thoracic region: Secondary | ICD-10-CM | POA: Diagnosis not present

## 2018-04-21 DIAGNOSIS — R1084 Generalized abdominal pain: Secondary | ICD-10-CM | POA: Diagnosis not present

## 2018-04-21 DIAGNOSIS — M79604 Pain in right leg: Secondary | ICD-10-CM | POA: Diagnosis not present

## 2018-04-21 DIAGNOSIS — M5136 Other intervertebral disc degeneration, lumbar region: Secondary | ICD-10-CM | POA: Diagnosis not present

## 2018-04-21 NOTE — Telephone Encounter (Signed)
Pt left VM on VAD line  Monday to cancel appt for 04/21/18, I returned pt call 4/30 and 5/1 , to reschedule appt , asked pt to call back to reschedule appt

## 2018-04-30 DIAGNOSIS — H401122 Primary open-angle glaucoma, left eye, moderate stage: Secondary | ICD-10-CM | POA: Diagnosis not present

## 2018-05-10 DIAGNOSIS — M5136 Other intervertebral disc degeneration, lumbar region: Secondary | ICD-10-CM | POA: Diagnosis not present

## 2018-05-10 DIAGNOSIS — M5416 Radiculopathy, lumbar region: Secondary | ICD-10-CM | POA: Diagnosis not present

## 2018-05-10 DIAGNOSIS — E876 Hypokalemia: Secondary | ICD-10-CM | POA: Diagnosis not present

## 2018-05-19 ENCOUNTER — Encounter (HOSPITAL_COMMUNITY): Payer: Self-pay | Admitting: *Deleted

## 2018-05-19 NOTE — Progress Notes (Signed)
Received letter in the mail from pt stating she is does not have anyone to bring her to [facility] for appointments so she is now following up with Dr Ouida Sills in [place].

## 2018-05-20 DIAGNOSIS — N184 Chronic kidney disease, stage 4 (severe): Secondary | ICD-10-CM | POA: Diagnosis not present

## 2018-05-20 DIAGNOSIS — N281 Cyst of kidney, acquired: Secondary | ICD-10-CM | POA: Diagnosis not present

## 2018-05-20 DIAGNOSIS — H401122 Primary open-angle glaucoma, left eye, moderate stage: Secondary | ICD-10-CM | POA: Diagnosis not present

## 2018-05-20 DIAGNOSIS — L502 Urticaria due to cold and heat: Secondary | ICD-10-CM | POA: Diagnosis not present

## 2018-05-20 DIAGNOSIS — E1129 Type 2 diabetes mellitus with other diabetic kidney complication: Secondary | ICD-10-CM | POA: Diagnosis not present

## 2018-05-20 DIAGNOSIS — I5022 Chronic systolic (congestive) heart failure: Secondary | ICD-10-CM | POA: Diagnosis not present

## 2018-05-22 IMAGING — CR DG CHEST 2V
1 series · 2 of 2 positions shown · non-contrast
Comparison: None in PACs

CLINICAL DATA: Unstable angina with symptoms beginning on [REDACTED]. Nonsmoker.

EXAM:
CHEST  2 VIEW

[Series 1: dg chest 2 view · 0.14mm/px · 2 of 2 slices shown]
[im 1/2]
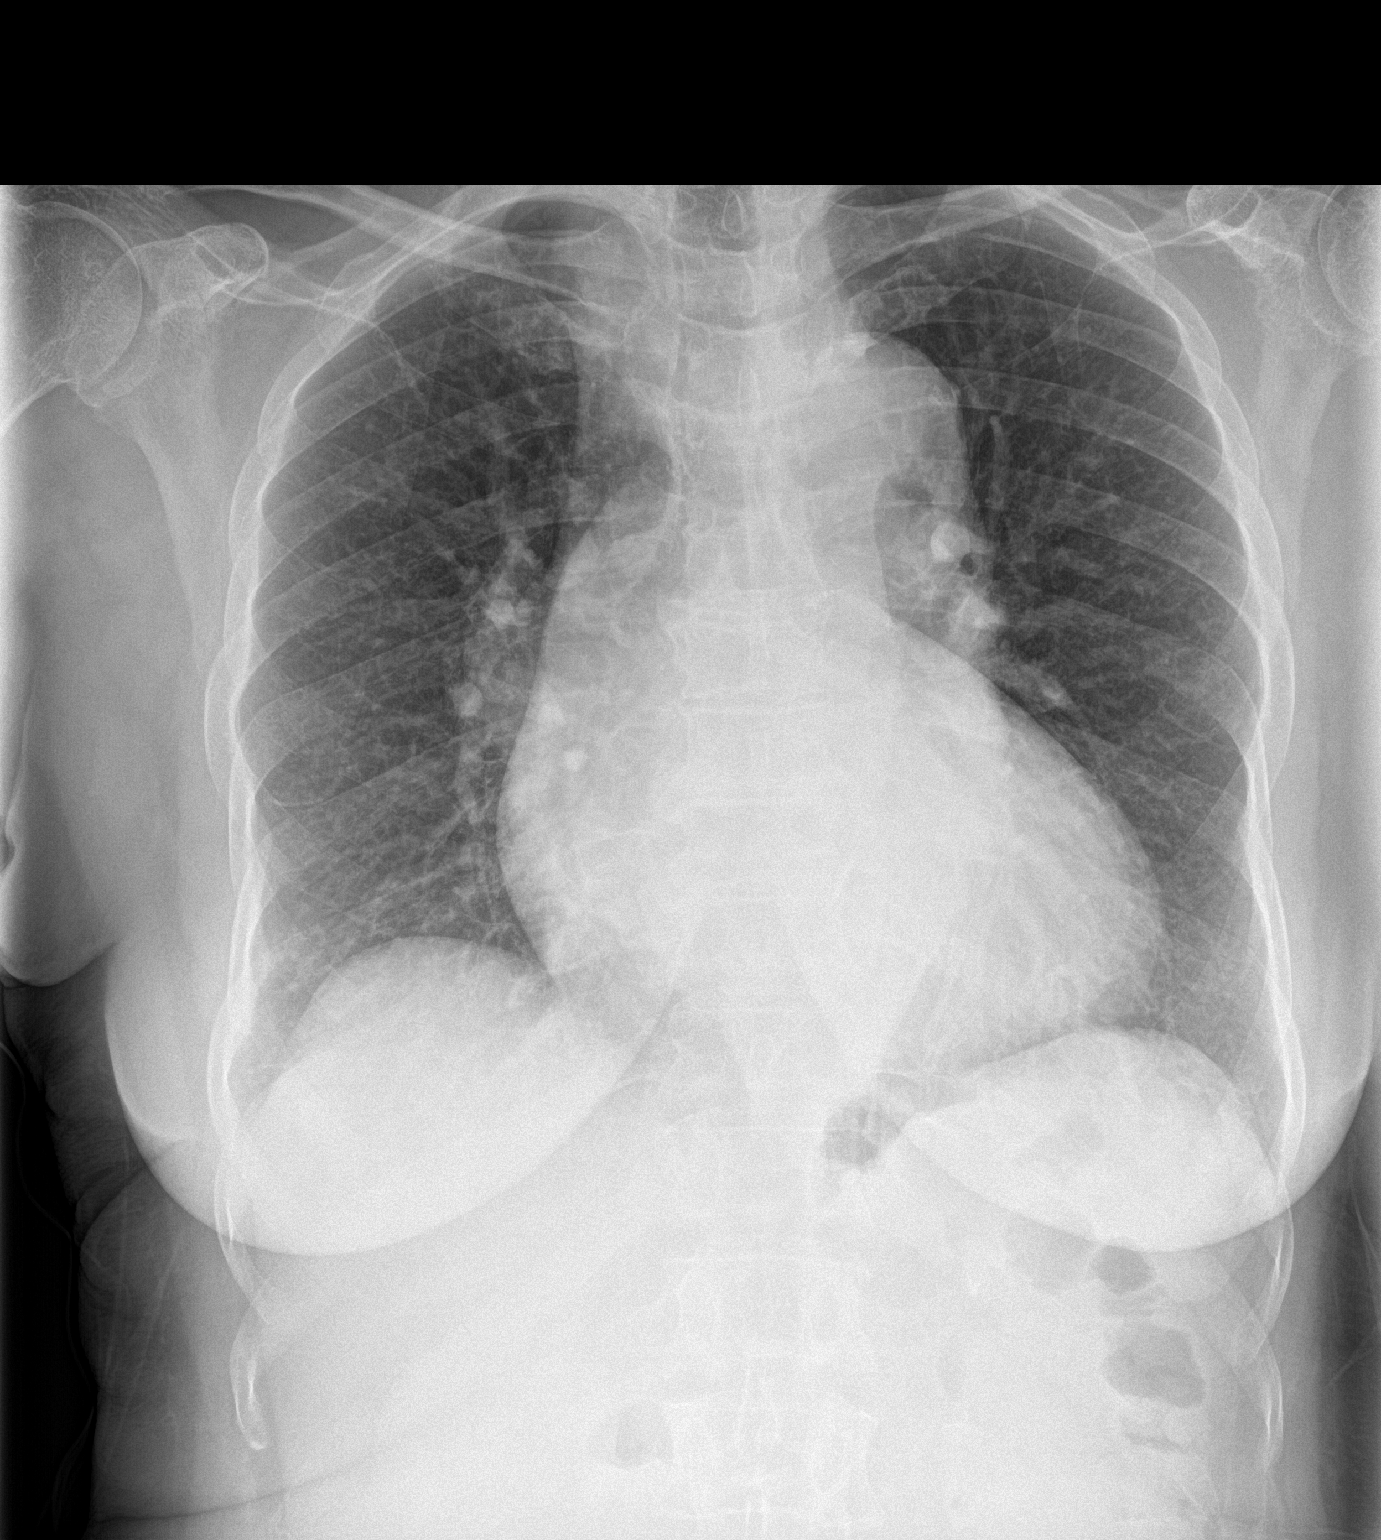
[im 2/2]
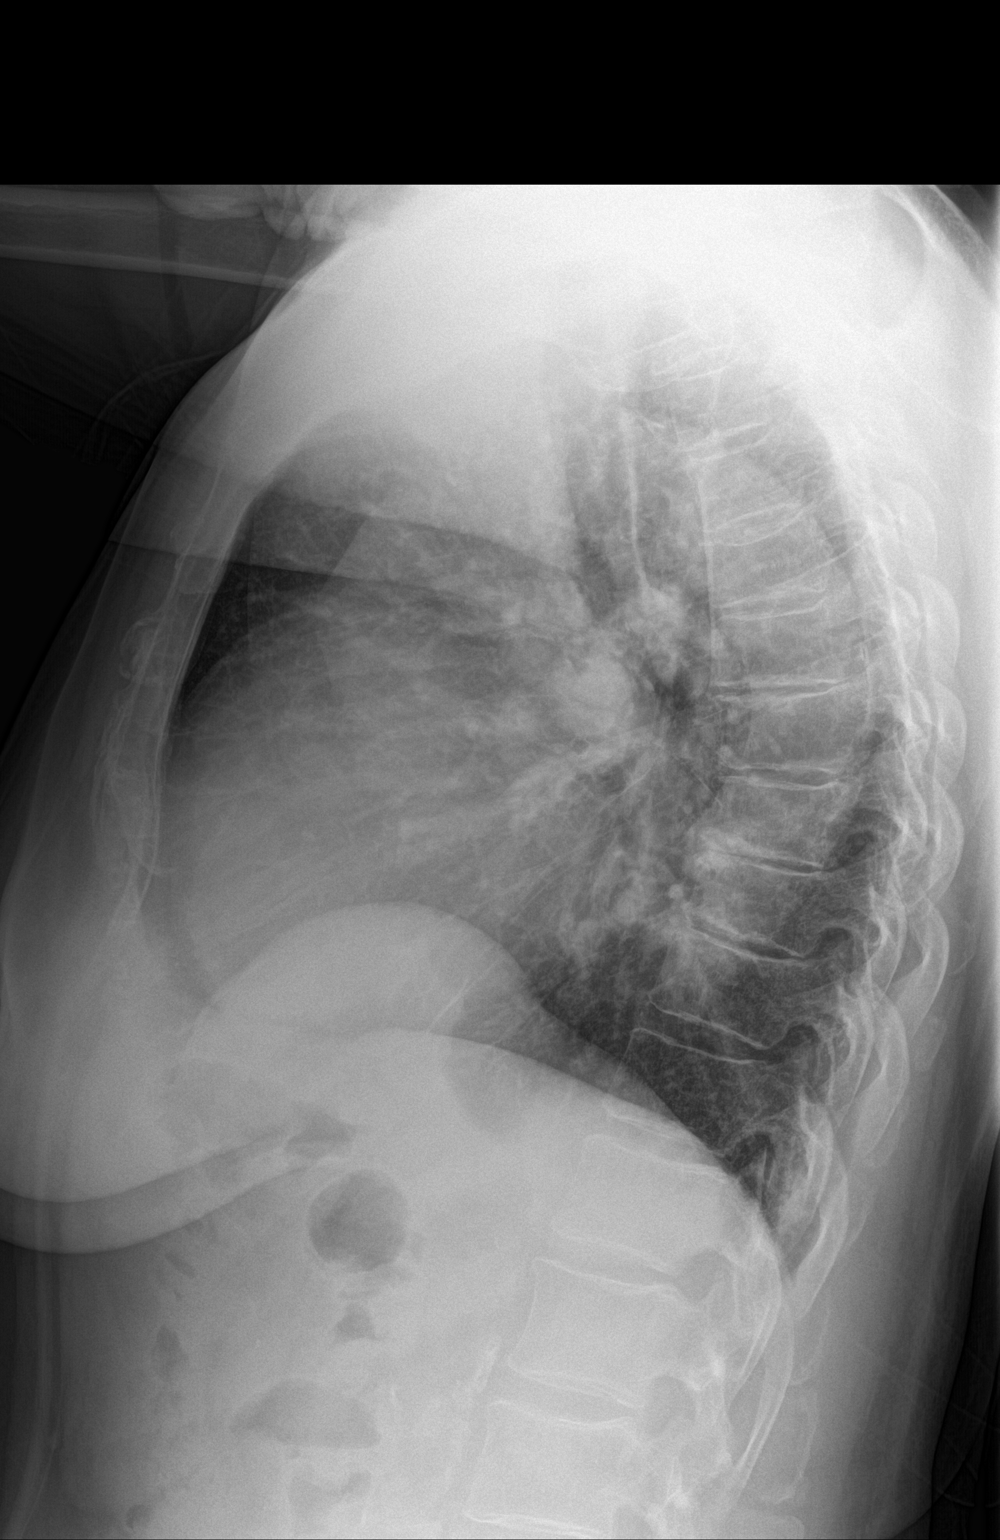

[2 of 2 positions shown; findings below may reference images not displayed]

FINDINGS: The lungs are well-expanded. The interstitial markings are coarse.
There is no alveolar infiltrate or pleural effusion. The cardiac
silhouette is mildly enlarged. The pulmonary vascularity is mildly
engorged. There is tortuosity of the ascending and descending
thoracic aorta with mural calcification. There is mild multilevel
degenerative disc disease of the thoracic spine.
IMPRESSION: Cardiomegaly with mild pulmonary interstitial prominence suggests
low-grade CHF. Alternatively chronic bronchitic-fibrotic changes
could produce similar appearance of the pulmonary interstitium.
There is no acute pneumonia nor pleural effusion.

Thoracic aortic atherosclerosis.

## 2018-05-22 DEATH — deceased

## 2018-05-23 IMAGING — CR DG CHEST 1V PORT
1 series · 1 of 1 positions shown · non-contrast
Comparison: 02/03/2017

CLINICAL DATA: Respiratory failure. History of diabetes, coronary
artery disease, hypertension, left heart catheterization. Nonsmoker.

EXAM:
PORTABLE CHEST 1 VIEW

[AP]
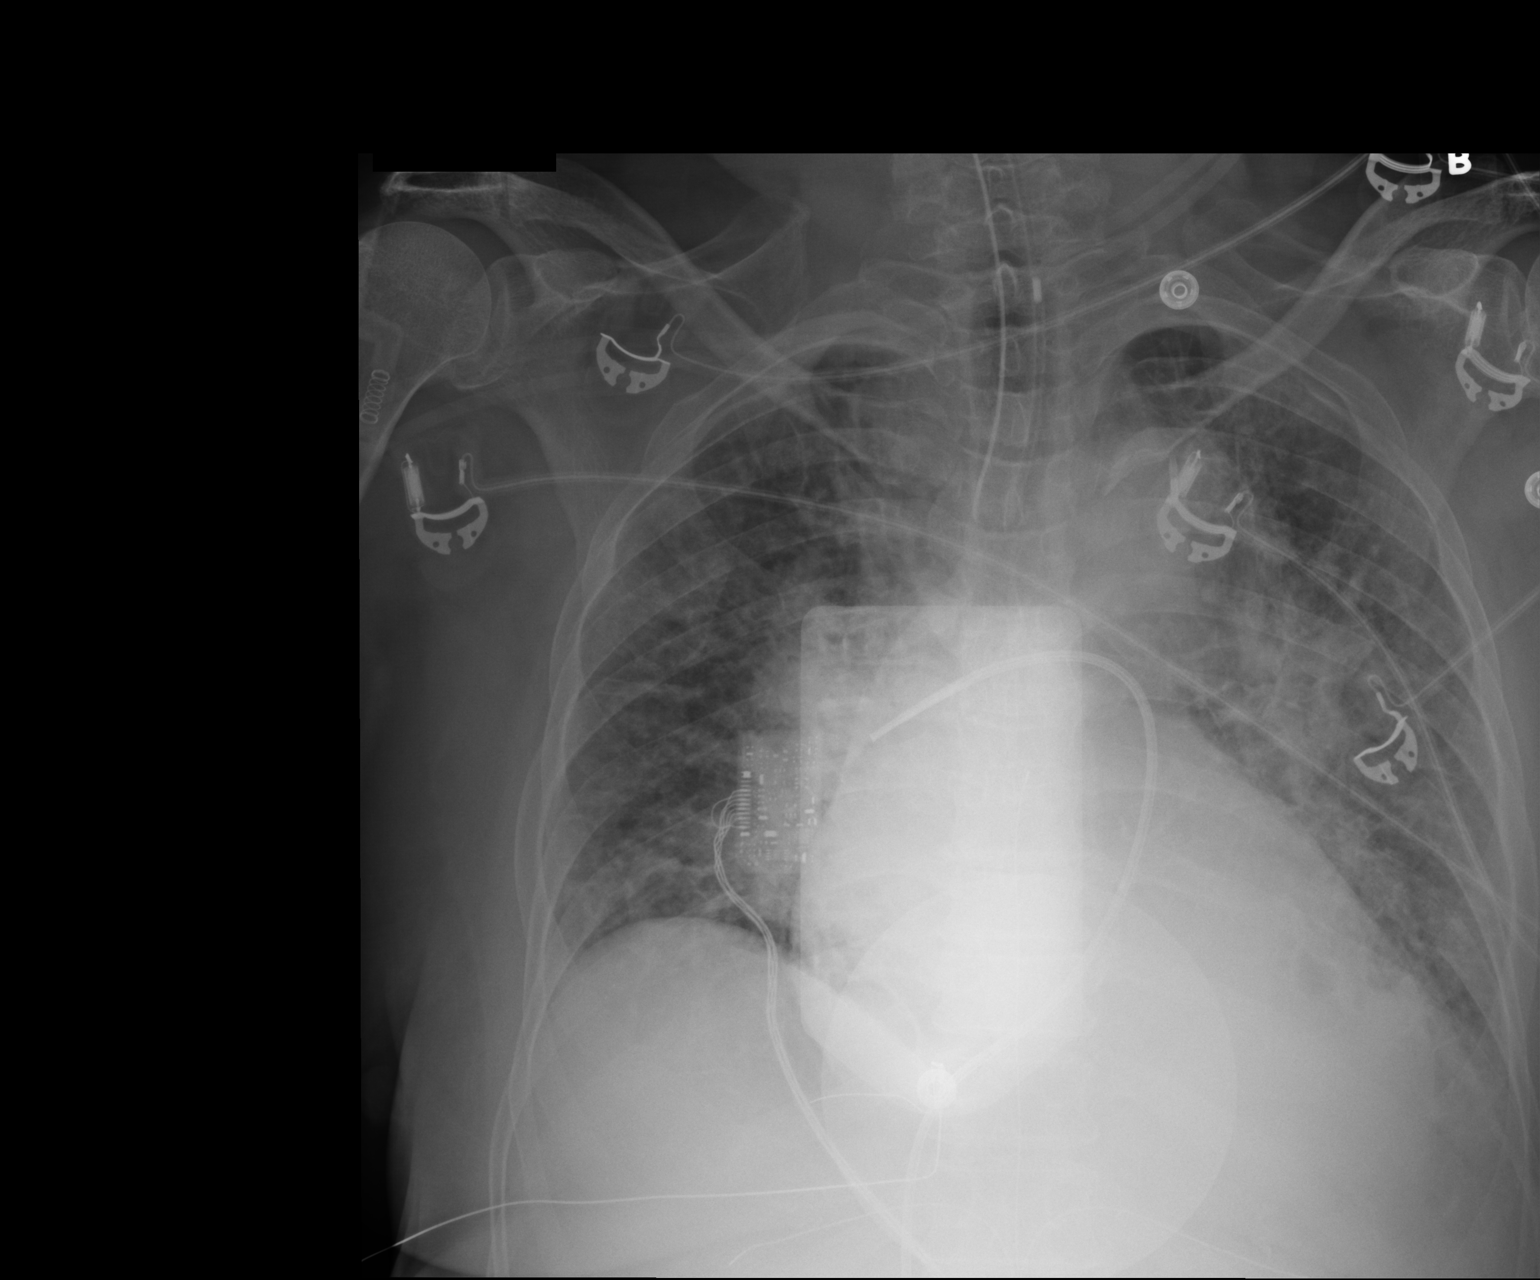

[1 of 1 positions shown; findings below may reference images not displayed]

FINDINGS: Interval placement of an endotracheal tube with tip measuring 3.3 cm
above the carina. An inferior approach Swan-Ganz catheter is present
with tip over the right pulmonary artery. Shallow inspiration.
Cardiac enlargement. Mild diffuse pulmonary vascular congestion.
Mild interstitial pattern to the lung bases suggest edema. No
definite consolidation. No pneumothorax.
IMPRESSION: Appliances appear in satisfactory location. Shallow inspiration.
Cardiac enlargement with vascular congestion. Mild edema.

## 2018-05-24 DIAGNOSIS — E785 Hyperlipidemia, unspecified: Secondary | ICD-10-CM | POA: Diagnosis not present

## 2018-05-24 DIAGNOSIS — I509 Heart failure, unspecified: Secondary | ICD-10-CM | POA: Diagnosis not present

## 2018-05-24 DIAGNOSIS — Z7982 Long term (current) use of aspirin: Secondary | ICD-10-CM | POA: Diagnosis not present

## 2018-05-24 DIAGNOSIS — R2681 Unsteadiness on feet: Secondary | ICD-10-CM | POA: Diagnosis not present

## 2018-05-24 DIAGNOSIS — Z6823 Body mass index (BMI) 23.0-23.9, adult: Secondary | ICD-10-CM | POA: Diagnosis not present

## 2018-05-24 DIAGNOSIS — I25119 Atherosclerotic heart disease of native coronary artery with unspecified angina pectoris: Secondary | ICD-10-CM | POA: Diagnosis not present

## 2018-05-24 DIAGNOSIS — I130 Hypertensive heart and chronic kidney disease with heart failure and stage 1 through stage 4 chronic kidney disease, or unspecified chronic kidney disease: Secondary | ICD-10-CM | POA: Diagnosis not present

## 2018-05-24 DIAGNOSIS — N183 Chronic kidney disease, stage 3 (moderate): Secondary | ICD-10-CM | POA: Diagnosis not present

## 2018-05-24 DIAGNOSIS — I4891 Unspecified atrial fibrillation: Secondary | ICD-10-CM | POA: Diagnosis not present

## 2018-05-24 DIAGNOSIS — I252 Old myocardial infarction: Secondary | ICD-10-CM | POA: Diagnosis not present

## 2018-05-24 IMAGING — CR DG CHEST 1V PORT
1 series · 1 of 1 positions shown · non-contrast
Comparison: Chest radiograph 02/05/2017 at [DATE] a.m.

CLINICAL DATA: Status post CABG

EXAM:
PORTABLE CHEST 1 VIEW

[AP]
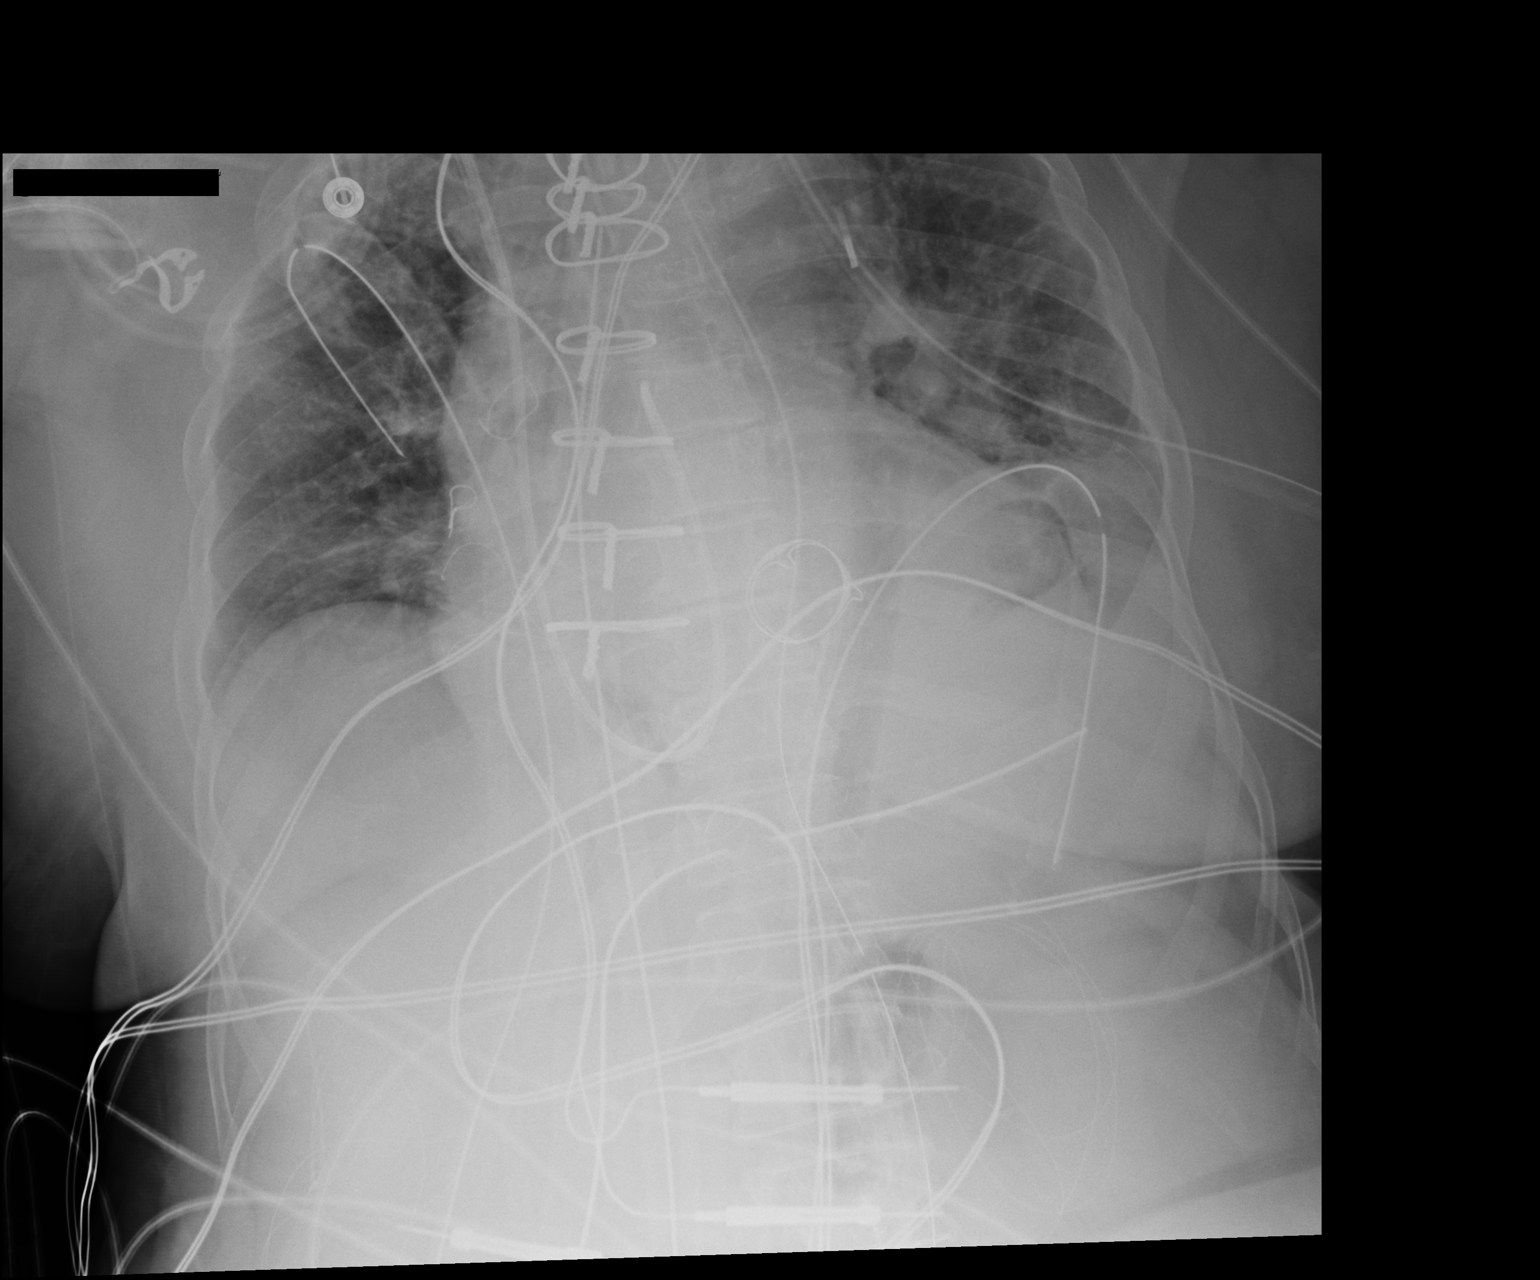

[1 of 1 positions shown; findings below may reference images not displayed]

FINDINGS: The patient has undergone median sternotomy. There is now prosthetic
mitral valve. There are chest tubes overlying the right upper and
left lower hemi thoraces. Intra-aortic balloon pump radiopaque
marker is just above the left hilum. Endotracheal tube tip is just
below the level of the clavicular heads. NG tube side port overlies
the gastric body. Pulmonary artery catheter tip overlies the main
pulmonary artery.

There is shallow lung inflation and bibasilar atelectasis. Suspected
small left pleural effusion. No focal consolidation. Mild pulmonary
edema.
IMPRESSION: 1. Status post CABG and mitral valve replacement.
2. PA catheter tip overlying the main pulmonary artery.
3. Intra-aortic balloon pump marker overlies the distal aortic arch.
4. Mild pulmonary edema and bibasilar atelectasis.

## 2018-05-24 IMAGING — CR DG ABD PORTABLE 1V
1 series · 1 of 1 positions shown · non-contrast
Comparison: None.

CLINICAL DATA: OG tube placement

EXAM:
PORTABLE ABDOMEN - 1 VIEW

[AP]
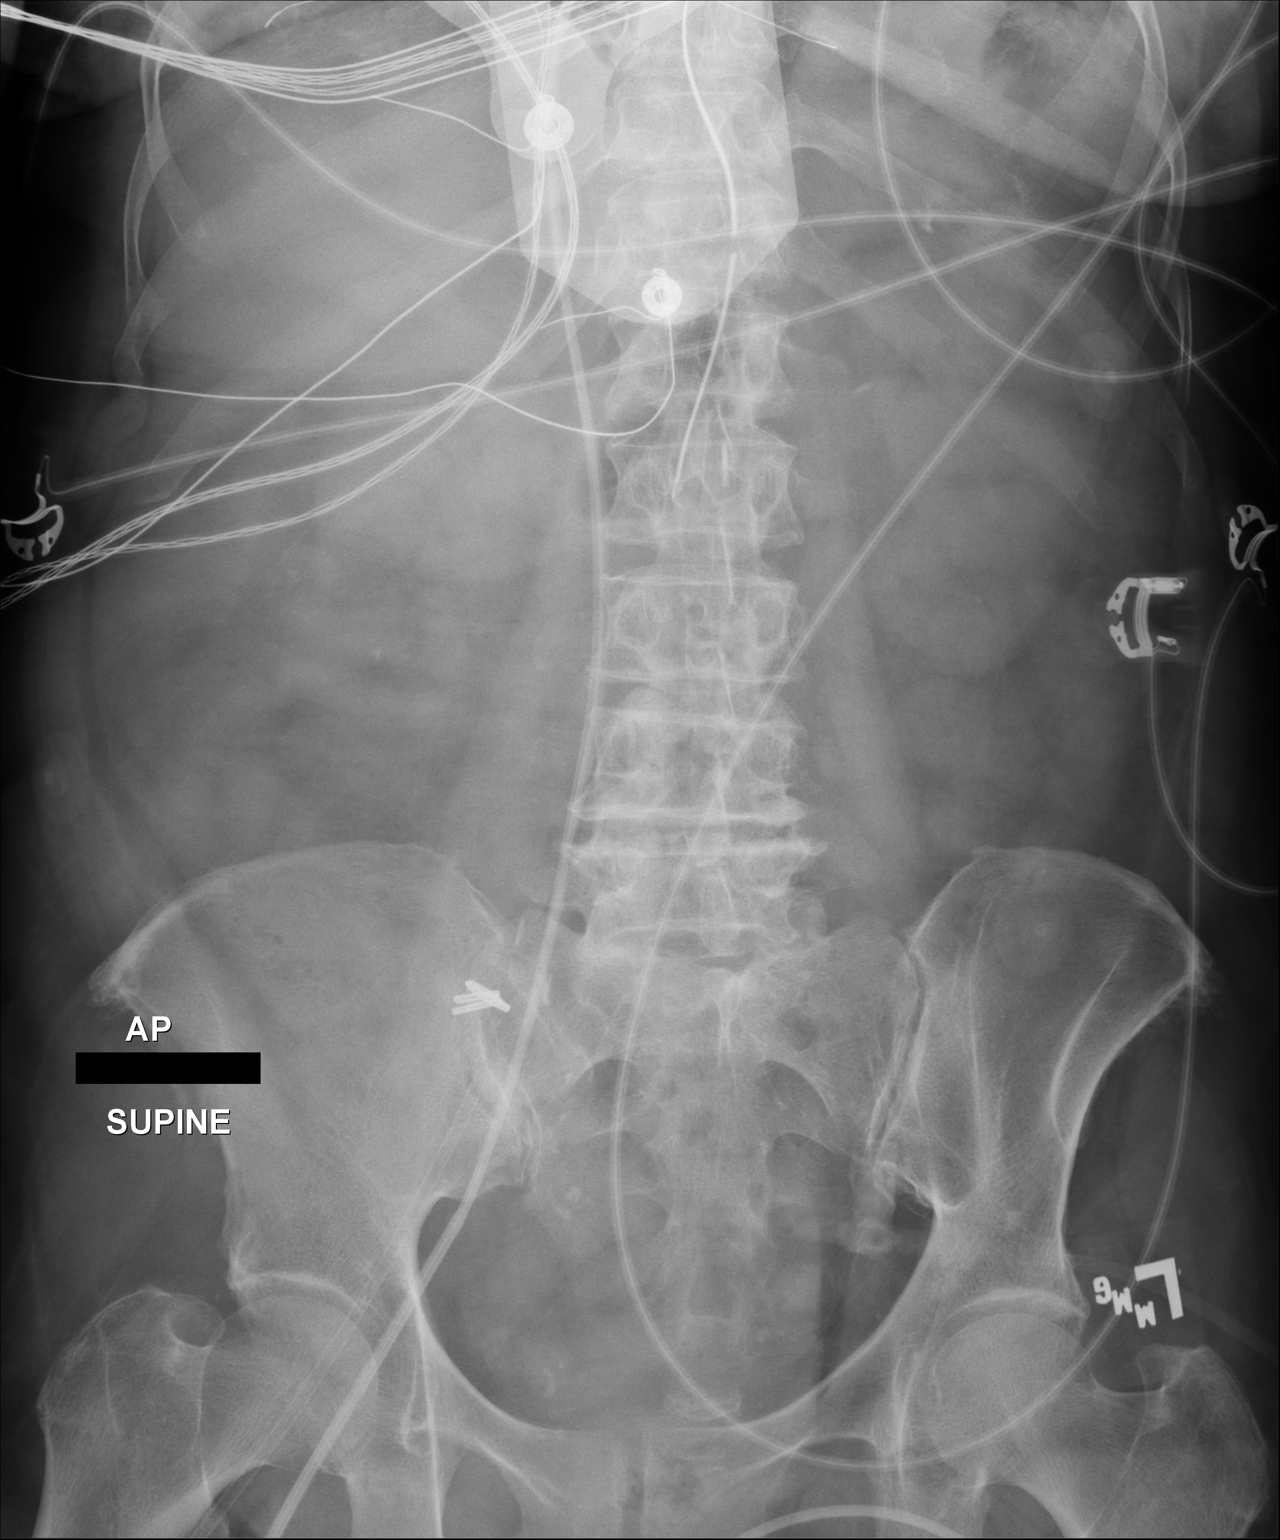

[1 of 1 positions shown; findings below may reference images not displayed]

FINDINGS: Enteric tube is demonstrated with tip in the mid upper abdomen
consistent with location in the body of the stomach. Paucity of gas
in the abdomen. Surgical clips in the right lower quadrant. No
radiopaque stones. Degenerative changes in the spine.
IMPRESSION: Enteric tube tip is in the upper mid abdomen consistent with
location in the body of the stomach.

## 2018-05-24 NOTE — Progress Notes (Signed)
   05/26/2018 9:41 PM   [name] [name] [birth year] [id number]  Referring provider: Kirk Ruths, MD [facility] [place], [place] [address]  Patient left without being seen.  Certified letter has been sent.

## 2018-05-25 ENCOUNTER — Ambulatory Visit
Admission: RE | Admit: 2018-05-25 | Discharge: 2018-05-25 | Disposition: A | Discharge status: Home / Self Care | Payer: Medicare HMO | Source: Ambulatory Visit | Attending: Internal Medicine | Admitting: Internal Medicine

## 2018-05-25 ENCOUNTER — Other Ambulatory Visit: Payer: Self-pay | Admitting: Internal Medicine

## 2018-05-25 DIAGNOSIS — E611 Iron deficiency: Secondary | ICD-10-CM | POA: Diagnosis not present

## 2018-05-25 DIAGNOSIS — R270 Ataxia, unspecified: Secondary | ICD-10-CM | POA: Diagnosis not present

## 2018-05-25 DIAGNOSIS — R531 Weakness: Secondary | ICD-10-CM | POA: Diagnosis not present

## 2018-05-25 DIAGNOSIS — R69 Illness, unspecified: Secondary | ICD-10-CM | POA: Diagnosis not present

## 2018-05-26 ENCOUNTER — Encounter: Payer: Self-pay | Admitting: Urology

## 2018-05-26 ENCOUNTER — Ambulatory Visit (INDEPENDENT_AMBULATORY_CARE_PROVIDER_SITE_OTHER): Payer: Medicare HMO | Admitting: Urology

## 2018-05-26 VITALS — BP 172/85 | HR 66 | Ht 67.0 in | Wt 144.0 lb

## 2018-05-26 DIAGNOSIS — N200 Calculus of kidney: Secondary | ICD-10-CM

## 2018-05-26 DIAGNOSIS — N281 Cyst of kidney, acquired: Secondary | ICD-10-CM

## 2018-05-28 IMAGING — CR DG CHEST 1V PORT
1 series · 1 of 1 positions shown · non-contrast
Comparison: Portable chest x-ray February 08, 2017

CLINICAL DATA: Respiratory failure, intubated patient. Status post
CABG and mitral valve repair 5 days ago. History of diabetes

EXAM:
PORTABLE CHEST 1 VIEW

[AP]
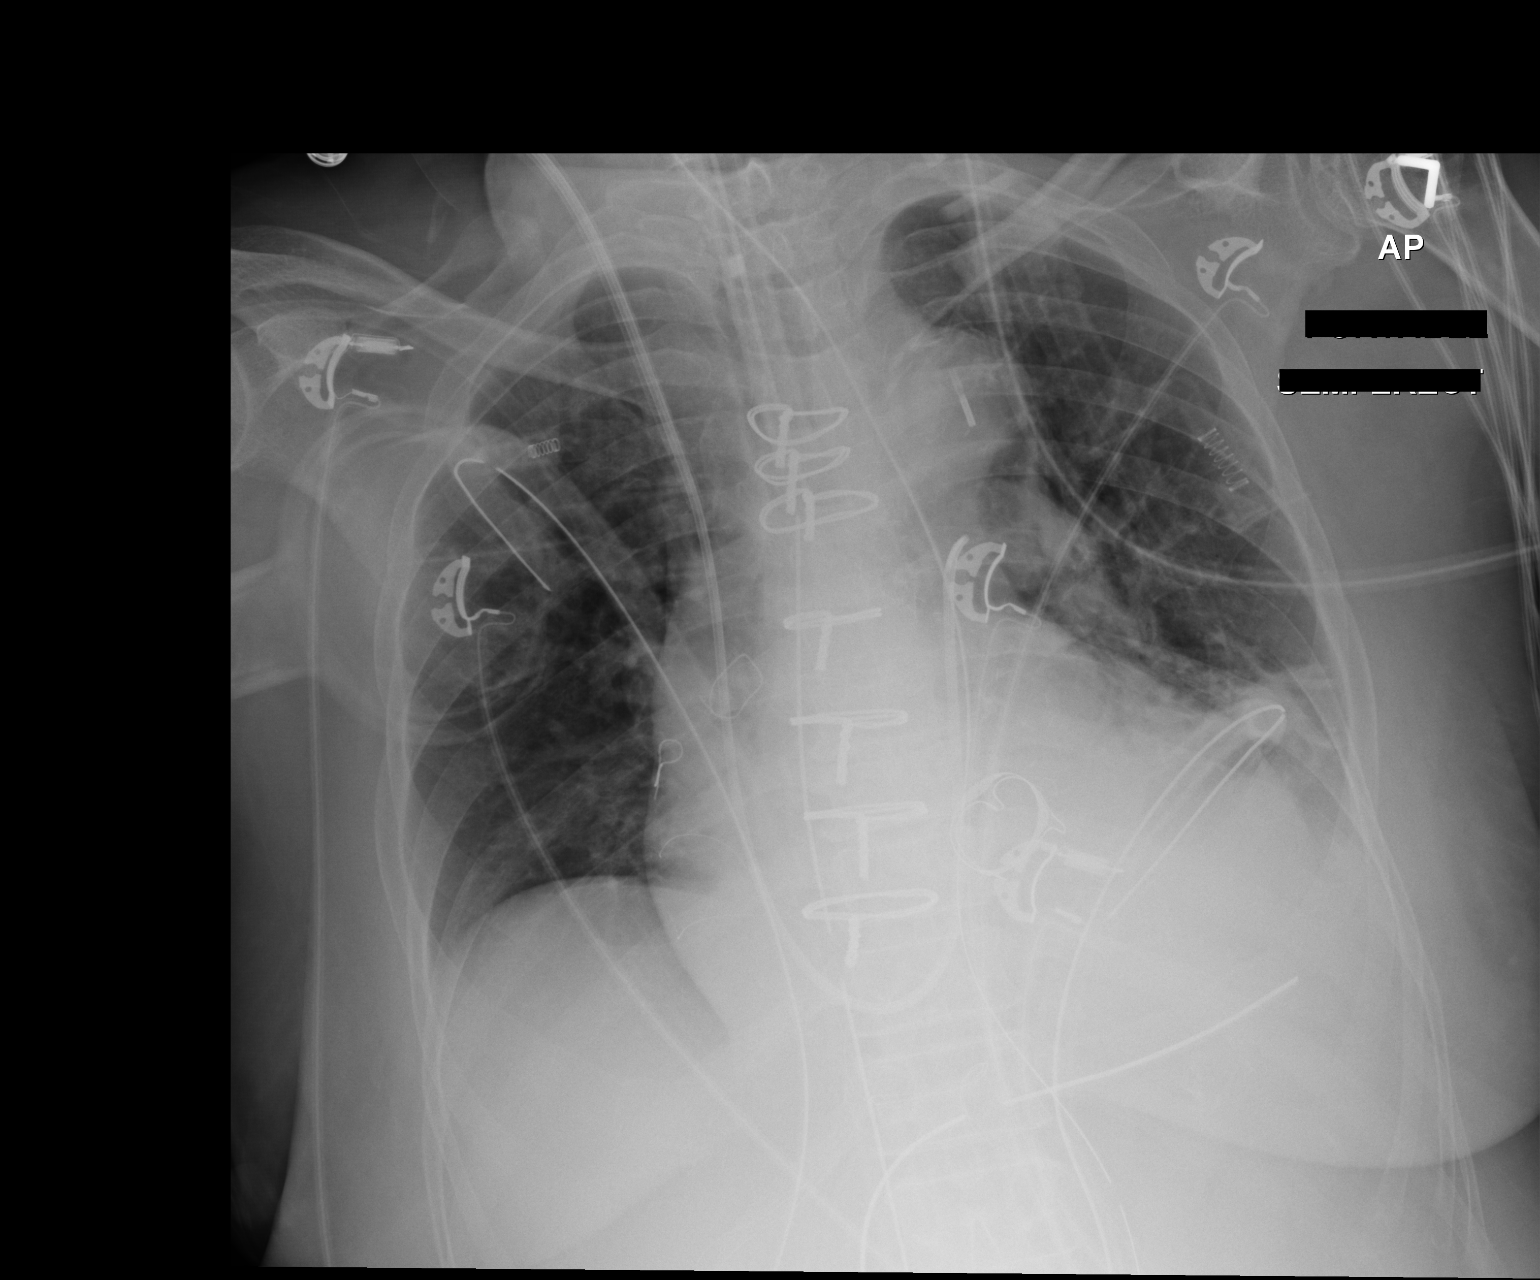

[1 of 1 positions shown; findings below may reference images not displayed]

FINDINGS: The lungs are slightly better inflated today. There remains
increased density at the left lung base. There is no large pleural
effusion and no pneumothorax. The cardiac silhouette remains
enlarged. The pulmonary vascularity is less engorged. There is
tortuosity of the ascending and descending thoracic aorta. An intra
aortic balloon pump is present with the tip projecting over the
aortic arch slightly higher than before but this may be affected by
patient positioning. The Swan-Ganz catheter tip projects in the
proximal left main pulmonary artery. The endotracheal tube tip lies
approximately 2.8 cm above the carina. The esophagogastric tube and
tip and proximal port lie below the GE junction. The right sided
chest tube tip projects over the posterior 6 rib. A mediastinal
drain projects at approximately the level of the carina. The 2 left
lower chest tubes are in stable position.
IMPRESSION: Improved aeration of both lungs with decreased interstitial edema.
Persistent left lower lobe atelectasis or pneumonia. Small left
pleural effusion likely is present. No pneumothorax.

Cardiomegaly with decreased pulmonary vascular congestion.

The support tubes and devices are in stable position. Please note
that the tip of the intra-aortic balloon pump appears slightly
higher than that seen on the previous study but is similar to that
seen on the study 07 February, 2017.

## 2018-05-31 DIAGNOSIS — D649 Anemia, unspecified: Secondary | ICD-10-CM | POA: Diagnosis not present

## 2018-05-31 DIAGNOSIS — R270 Ataxia, unspecified: Secondary | ICD-10-CM | POA: Diagnosis not present

## 2018-05-31 DIAGNOSIS — N390 Urinary tract infection, site not specified: Secondary | ICD-10-CM | POA: Diagnosis not present

## 2018-06-02 DIAGNOSIS — D649 Anemia, unspecified: Secondary | ICD-10-CM | POA: Diagnosis not present

## 2018-06-02 DIAGNOSIS — N390 Urinary tract infection, site not specified: Secondary | ICD-10-CM | POA: Diagnosis not present

## 2018-06-02 DIAGNOSIS — R270 Ataxia, unspecified: Secondary | ICD-10-CM | POA: Diagnosis not present

## 2018-06-08 DIAGNOSIS — R270 Ataxia, unspecified: Secondary | ICD-10-CM | POA: Diagnosis not present

## 2018-06-08 DIAGNOSIS — I10 Essential (primary) hypertension: Secondary | ICD-10-CM | POA: Diagnosis not present

## 2018-06-09 DIAGNOSIS — N390 Urinary tract infection, site not specified: Secondary | ICD-10-CM | POA: Diagnosis not present

## 2018-06-09 DIAGNOSIS — R270 Ataxia, unspecified: Secondary | ICD-10-CM | POA: Diagnosis not present

## 2018-06-09 DIAGNOSIS — D649 Anemia, unspecified: Secondary | ICD-10-CM | POA: Diagnosis not present

## 2018-06-16 DIAGNOSIS — R270 Ataxia, unspecified: Secondary | ICD-10-CM | POA: Diagnosis not present

## 2018-06-16 DIAGNOSIS — N390 Urinary tract infection, site not specified: Secondary | ICD-10-CM | POA: Diagnosis not present

## 2018-06-16 DIAGNOSIS — D649 Anemia, unspecified: Secondary | ICD-10-CM | POA: Diagnosis not present

## 2018-06-25 DIAGNOSIS — D649 Anemia, unspecified: Secondary | ICD-10-CM | POA: Diagnosis not present

## 2018-06-25 DIAGNOSIS — N390 Urinary tract infection, site not specified: Secondary | ICD-10-CM | POA: Diagnosis not present

## 2018-06-25 DIAGNOSIS — R270 Ataxia, unspecified: Secondary | ICD-10-CM | POA: Diagnosis not present

## 2018-07-07 DIAGNOSIS — R1013 Epigastric pain: Secondary | ICD-10-CM | POA: Diagnosis not present

## 2018-07-14 ENCOUNTER — Other Ambulatory Visit: Payer: Self-pay | Admitting: Internal Medicine

## 2018-07-14 DIAGNOSIS — R100 Acute abdomen: Secondary | ICD-10-CM

## 2018-07-21 ENCOUNTER — Ambulatory Visit
Admission: RE | Admit: 2018-07-21 | Discharge: 2018-07-21 | Disposition: A | Discharge status: Home / Self Care | Payer: Medicare HMO | Source: Ambulatory Visit | Attending: Internal Medicine | Admitting: Internal Medicine

## 2018-07-21 DIAGNOSIS — N281 Cyst of kidney, acquired: Secondary | ICD-10-CM | POA: Insufficient documentation

## 2018-07-21 DIAGNOSIS — I700 Atherosclerosis of aorta: Secondary | ICD-10-CM | POA: Diagnosis not present

## 2018-07-21 DIAGNOSIS — K8020 Calculus of gallbladder without cholecystitis without obstruction: Secondary | ICD-10-CM | POA: Insufficient documentation

## 2018-07-21 DIAGNOSIS — K8689 Other specified diseases of pancreas: Secondary | ICD-10-CM | POA: Diagnosis not present

## 2018-07-21 DIAGNOSIS — I7090 Unspecified atherosclerosis: Secondary | ICD-10-CM | POA: Insufficient documentation

## 2018-07-21 DIAGNOSIS — J90 Pleural effusion, not elsewhere classified: Secondary | ICD-10-CM | POA: Insufficient documentation

## 2018-07-21 DIAGNOSIS — J9811 Atelectasis: Secondary | ICD-10-CM | POA: Diagnosis not present

## 2018-07-21 DIAGNOSIS — N200 Calculus of kidney: Secondary | ICD-10-CM | POA: Diagnosis not present

## 2018-07-21 DIAGNOSIS — R100 Acute abdomen: Secondary | ICD-10-CM | POA: Diagnosis not present

## 2018-07-21 DIAGNOSIS — K5730 Diverticulosis of large intestine without perforation or abscess without bleeding: Secondary | ICD-10-CM | POA: Insufficient documentation

## 2018-07-21 DIAGNOSIS — R109 Unspecified abdominal pain: Secondary | ICD-10-CM | POA: Diagnosis not present

## 2018-07-21 MED ORDER — IOHEXOL 300 MG/ML  SOLN
75.0000 mL | Freq: Once | INTRAMUSCULAR | Status: AC | PRN
  Administered 2018-07-21: 75 mL via INTRAVENOUS

## 2018-08-19 DIAGNOSIS — H401111 Primary open-angle glaucoma, right eye, mild stage: Secondary | ICD-10-CM | POA: Diagnosis not present

## 2018-08-26 DIAGNOSIS — R1031 Right lower quadrant pain: Secondary | ICD-10-CM | POA: Diagnosis not present

## 2018-08-26 DIAGNOSIS — D649 Anemia, unspecified: Secondary | ICD-10-CM | POA: Diagnosis not present

## 2018-08-26 DIAGNOSIS — R17 Unspecified jaundice: Secondary | ICD-10-CM | POA: Diagnosis not present

## 2018-08-26 DIAGNOSIS — R933 Abnormal findings on diagnostic imaging of other parts of digestive tract: Secondary | ICD-10-CM | POA: Diagnosis not present

## 2018-08-26 DIAGNOSIS — R1032 Left lower quadrant pain: Secondary | ICD-10-CM | POA: Diagnosis not present

## 2018-09-05 ENCOUNTER — Emergency Department
Admission: EM | Admit: 2018-09-05 | Discharge: 2018-09-05 | Disposition: A | Discharge status: Home / Self Care | Payer: Medicare HMO | Attending: Emergency Medicine | Admitting: Emergency Medicine

## 2018-09-05 ENCOUNTER — Emergency Department: Payer: Medicare HMO

## 2018-09-05 ENCOUNTER — Other Ambulatory Visit: Payer: Self-pay

## 2018-09-05 ENCOUNTER — Encounter: Payer: Self-pay | Admitting: Emergency Medicine

## 2018-09-05 DIAGNOSIS — Z79899 Other long term (current) drug therapy: Secondary | ICD-10-CM | POA: Diagnosis not present

## 2018-09-05 DIAGNOSIS — Z7982 Long term (current) use of aspirin: Secondary | ICD-10-CM | POA: Insufficient documentation

## 2018-09-05 DIAGNOSIS — I129 Hypertensive chronic kidney disease with stage 1 through stage 4 chronic kidney disease, or unspecified chronic kidney disease: Secondary | ICD-10-CM | POA: Diagnosis not present

## 2018-09-05 DIAGNOSIS — I2510 Atherosclerotic heart disease of native coronary artery without angina pectoris: Secondary | ICD-10-CM | POA: Insufficient documentation

## 2018-09-05 DIAGNOSIS — Z951 Presence of aortocoronary bypass graft: Secondary | ICD-10-CM | POA: Insufficient documentation

## 2018-09-05 DIAGNOSIS — N184 Chronic kidney disease, stage 4 (severe): Secondary | ICD-10-CM | POA: Diagnosis not present

## 2018-09-05 DIAGNOSIS — E1122 Type 2 diabetes mellitus with diabetic chronic kidney disease: Secondary | ICD-10-CM | POA: Diagnosis not present

## 2018-09-05 DIAGNOSIS — R531 Weakness: Secondary | ICD-10-CM | POA: Insufficient documentation

## 2018-09-05 LAB — COMPREHENSIVE METABOLIC PANEL
ALK PHOS: 72 U/L (ref 38–126)
ALT: 14 U/L (ref 0–44)
ANION GAP: 7 (ref 5–15)
AST: 23 U/L (ref 15–41)
Albumin: 3.6 g/dL (ref 3.5–5.0)
BILIRUBIN TOTAL: 3.6 mg/dL — AB (ref 0.3–1.2)
BUN: 24 mg/dL — ABNORMAL HIGH (ref 8–23)
CALCIUM: 9.9 mg/dL (ref 8.9–10.3)
CO2: 21 mmol/L — AB (ref 22–32)
Chloride: 115 mmol/L — ABNORMAL HIGH (ref 98–111)
Creatinine, Ser: 1.83 mg/dL — ABNORMAL HIGH (ref 0.44–1.00)
GFR, EST AFRICAN AMERICAN: 29 mL/min — AB (ref >60)
GFR, EST NON AFRICAN AMERICAN: 25 mL/min — AB (ref >60)
Glucose, Bld: 128 mg/dL — ABNORMAL HIGH (ref 70–99)
Potassium: 3.7 mmol/L (ref 3.5–5.1)
SODIUM: 143 mmol/L (ref 135–145)
TOTAL PROTEIN: 6.7 g/dL (ref 6.5–8.1)

## 2018-09-05 LAB — CBC WITH DIFFERENTIAL/PLATELET
BASOS ABS: 0 10*3/uL (ref 0–0.1)
BASOS PCT: 1 %
EOS ABS: 0.4 10*3/uL (ref 0–0.7)
Eosinophils Relative: 9 %
HEMATOCRIT: 38.1 % (ref 35.0–47.0)
HEMOGLOBIN: 13 g/dL (ref 12.0–16.0)
Lymphocytes Relative: 28 %
Lymphs Abs: 1.3 10*3/uL (ref 1.0–3.6)
MCH: 31.8 pg (ref 26.0–34.0)
MCHC: 34.2 g/dL (ref 32.0–36.0)
MCV: 93.1 fL (ref 80.0–100.0)
MONOS PCT: 12 %
Monocytes Absolute: 0.6 10*3/uL (ref 0.2–0.9)
NEUTROS ABS: 2.4 10*3/uL (ref 1.4–6.5)
NEUTROS PCT: 50 %
Platelets: 134 10*3/uL — ABNORMAL LOW (ref 150–440)
RBC: 4.1 MIL/uL (ref 3.80–5.20)
RDW: 16.4 % — AB (ref 11.5–14.5)
WBC: 4.7 10*3/uL (ref 3.6–11.0)

## 2018-09-05 LAB — URINALYSIS, COMPLETE (UACMP) WITH MICROSCOPIC
Bacteria, UA: NONE SEEN
Bilirubin Urine: NEGATIVE
Glucose, UA: NEGATIVE mg/dL
Hgb urine dipstick: NEGATIVE
Ketones, ur: NEGATIVE mg/dL
Leukocytes, UA: NEGATIVE
NITRITE: NEGATIVE
PH: 5 (ref 5.0–8.0)
Protein, ur: NEGATIVE mg/dL
Specific Gravity, Urine: 1.004 — ABNORMAL LOW (ref 1.005–1.030)

## 2018-09-05 LAB — GLUCOSE, CAPILLARY: Glucose-Capillary: 111 mg/dL — ABNORMAL HIGH (ref 70–99)

## 2018-09-05 LAB — TROPONIN I: Troponin I: <0.03 ng/mL (ref <0.03)

## 2018-09-05 NOTE — ED Triage Notes (Signed)
First Nurse Note:  Arrives with daughter who reports patient has had lack of coordination, dizziness, and left facial droop since Tuesday.  Patient is AAOx3.  Skin warm and dry. Speech clear

## 2018-09-05 NOTE — Discharge Instructions (Addendum)
Return to the ER immediately for new or worsening weakness, fevers, vomiting, chest pain, confusion, or any other new or worsening symptoms that concern you.  Make an appointment to follow-up with your primary care doctor.

## 2018-09-05 NOTE — ED Provider Notes (Signed)
[facility] Emergency Department Provider Note ____________________________________________   First MD Initiated Contact with Patient 09/05/18 1026     (approximate)  I have reviewed the triage vital signs and the nursing notes.   HISTORY  Chief Complaint Altered Mental Status    HPI [name] [name] is a 83 y.o. female with PMH as noted below who presents with generalized weakness over approximately the last for 5 days, gradual onset, and worsening until today when it somewhat improved.  The patient states that she has been feeling weak.  She also reports difficulty with balance and her daughter reports that she has been somewhat slumping to the left.  There is also some question of possible left-sided facial droop and increased saliva but no drooling.  The patient denies numbness to the left but does acknowledge that she had some weakness which seems to be improving.  No associated fevers, vomiting or diarrhea, chest pain, difficulty breathing, or urinary symptoms.  Past Medical History:  Diagnosis Date  . [name]    a. LHC 02/03/17 for unstable angina showed severe 3v CAD w/ sequential 70% & 80% p-mLAD, 99% mLCx, CTO mRCA, elevated LV filling pressure  . Colon polyp 2010  . Diabetes mellitus with complication ([place])   . Glaucoma   . Hypertension     Patient Active Problem List   Diagnosis Date Noted  . Hypercalcemia 06/28/2017  . PAF (paroxysmal atrial fibrillation) ([place]) 03/26/2017  . CKD (chronic kidney disease), stage IV ([place]) 03/26/2017  . [name]   . [name] 02/05/2017  . Unstable angina ([place]) 02/03/2017  . [name] 02/03/2017  . Murmur 02/03/2017  . Type 2 diabetes mellitus with complication ([place]) 76/54/6503  . Essential hypertension 02/03/2017  . HLD (hyperlipidemia) 04/16/2016  . Absolute anemia 09/19/2014  . Encounter for screening colonoscopy 09/19/2014    Past Surgical  History:  Procedure Laterality Date  . APPENDECTOMY    . BREAST BIOPSY Right 1999  . COLONOSCOPY  2010  . CORONARY ARTERY BYPASS GRAFT N/A 02/05/2017   Procedure: CORONARY ARTERY BYPASS GRAFTING (CABG) x 4                                                                               LIMA-LAD SEQ SVG-DIAG-OM SVG-PD;  Surgeon: Gaye Pollack, MD;  Location: [facility];  Service: Open Heart Surgery;  Laterality: N/A;  . IABP INSERTION N/A 02/04/2017   Procedure: IABP Insertion;  Surgeon: Leonie Man, MD;  Location: [facility];  Service: Cardiovascular;  Laterality: N/A;  . LEFT HEART CATH AND CORONARY ANGIOGRAPHY Left 02/03/2017   Procedure: Left Heart Cath and Coronary Angiography;  Surgeon: Nelva Bush, MD;  Location: [facility];  Service: Cardiovascular;  Laterality: Left;  . MITRAL VALVE REPAIR N/A 02/05/2017   Procedure: MITRAL VALVE REPLACEMENT (MVR) WITH MAGNA MITRAL EASE PERICARDIAL BIOPROSTHESIS 25 MM;  Surgeon: Gaye Pollack, MD;  Location: [facility];  Service: Open Heart Surgery;  Laterality: N/A;  . PARATHYROIDECTOMY     1986  . RIGHT HEART CATH N/A 02/04/2017   Procedure: Right Heart Cath;  Surgeon: Leonie Man,  MD;  Location: [facility];  Service: Cardiovascular;  Laterality: N/A;  . TEE WITHOUT CARDIOVERSION N/A 02/05/2017   Procedure: TRANSESOPHAGEAL ECHOCARDIOGRAM (TEE);  Surgeon: Gaye Pollack, MD;  Location: [facility];  Service: Open Heart Surgery;  Laterality: N/A;    Prior to Admission medications   Medication Sig Start Date End Date Taking? Authorizing Provider  acetaminophen (TYLENOL) 500 MG tablet Take 500 mg by mouth every 6 (six) hours as needed for mild pain.     [provider]  aspirin EC 81 MG tablet Take 1 tablet (81 mg total) by mouth daily. 10/21/17   Bensimhon, Shaune Pascal, MD  carvedilol (COREG) 3.125 MG tablet TAKE 1 TABLET BY MOUTH TWICE DAILY 09/25/17   Larey Dresser, MD  furosemide (LASIX) 40 MG tablet Take 40 mg Daily,  alternating with 80 mg Daily. 01/18/18   Bensimhon, Shaune Pascal, MD  isosorbide mononitrate (IMDUR) 30 MG 24 hr tablet TAKE 1 TABLET BY MOUTH ONCE DAILY 09/25/17   Larey Dresser, MD  latanoprost (XALATAN) 0.005 % ophthalmic solution Place 1 drop into both eyes at bedtime.     [provider]  Potassium Chloride ER 20 MEQ TBCR TAKE 1 TABLET BY MOUTH ONCE DAILY 04/05/18   Bensimhon, Shaune Pascal, MD  sulfamethoxazole-trimethoprim (BACTRIM DS,SEPTRA DS) 800-160 MG tablet Take 1 tablet by mouth 2 (two) times daily. 05/25/18   [provider]  timolol (BETIMOL) 0.5 % ophthalmic solution Place 1 drop into both eyes 2 (two) times daily.    [provider]    Allergies Patient has no known allergies.  Family History  Problem Relation Age of Onset  . Diabetes Father   . Diabetes Sister   . Bladder Cancer Neg Hx   . Kidney cancer Neg Hx   . Prostate cancer Neg Hx     Social History Social History   Tobacco Use  . Smoking status: Never Smoker  . Smokeless tobacco: Never Used  Substance Use Topics  . Alcohol use: No  . Drug use: No    Review of Systems  Constitutional: No fever. Eyes: No visual changes. ENT: No sore throat. Cardiovascular: Denies chest pain. Respiratory: Denies shortness of breath. Gastrointestinal: No vomiting.  Genitourinary: Negative for dysuria.  Musculoskeletal: Negative for back pain. Skin: Negative for rash. Neurological: Negative for headaches.  Positive for resolved left-sided weakness.  ____________________________________________   PHYSICAL EXAM:  VITAL SIGNS: ED Triage Vitals  Enc Vitals Group     BP 09/05/18 0957 (!) 174/90     Pulse Rate 09/05/18 0957 (!) 59     Resp 09/05/18 0957 18     Temp 09/05/18 0957 97.9 F (36.6 C)     Temp Source 09/05/18 0957 Oral     SpO2 09/05/18 0957 100 %     Weight --      Height --      Head Circumference --      Peak Flow --      Pain Score 09/05/18 1007 0     Pain Loc --      Pain  Edu? --      Excl. in [place]? --     Constitutional: Alert and oriented.  Relatively well appearing and in no acute distress. Eyes: Conjunctivae are normal.  EOMI.  PERRLA.  No neglect. Head: Atraumatic. Nose: No congestion/rhinnorhea. Mouth/Throat: Mucous membranes are moist.   Neck: Normal range of motion.  Cardiovascular: Normal rate, regular rhythm. Grossly normal heart sounds.  Good  peripheral circulation. Respiratory: Normal respiratory effort.  No retractions. Lungs CTAB. Gastrointestinal: Soft and nontender. No distention.  Genitourinary: No flank tenderness. Musculoskeletal: No lower extremity edema.  Extremities warm and well perfused.  Neurologic:  Normal speech and language.  No facial droop visible.  Cranial nerves III through XII intact.  5/5 motor and sensory intact in all extremities.  Normal coordination.  No gross focal neurologic deficits are appreciated.  Skin:  Skin is warm and dry. No rash noted. Psychiatric: Mood and affect are normal. Speech and behavior are normal.  ____________________________________________   LABS (all labs ordered are listed, but only abnormal results are displayed)  Labs Reviewed  GLUCOSE, CAPILLARY - Abnormal; Notable for the following components:      Result Value   Glucose-Capillary 111 (*)    All other components within normal limits  COMPREHENSIVE METABOLIC PANEL - Abnormal; Notable for the following components:   Chloride 115 (*)    CO2 21 (*)    Glucose, Bld 128 (*)    BUN 24 (*)    Creatinine, Ser 1.83 (*)    Total Bilirubin 3.6 (*)    GFR calc non Af Amer 25 (*)    GFR calc Af Amer 29 (*)    All other components within normal limits  CBC WITH DIFFERENTIAL/PLATELET - Abnormal; Notable for the following components:   RDW 16.4 (*)    Platelets 134 (*)    All other components within normal limits  URINALYSIS, COMPLETE (UACMP) WITH MICROSCOPIC - Abnormal; Notable for the following components:   Color, Urine COLORLESS (*)     APPearance CLEAR (*)    Specific Gravity, Urine 1.004 (*)    All other components within normal limits  TROPONIN I   ____________________________________________  EKG  ED ECG REPORT I, Arta Silence, the attending physician, personally viewed and interpreted this ECG.  Date: 09/05/2018 EKG Time: 1045 Rate: 57 Rhythm: normal sinus rhythm QRS Axis: normal Intervals: normal ST/T Wave abnormalities: LVH with repolarization abnormality Narrative Interpretation: no evidence of acute ischemia  ____________________________________________  RADIOLOGY  CT head: No ICH or evidence of stroke  ____________________________________________   PROCEDURES  Procedure(s) performed: No  Procedures  Critical Care performed: No ____________________________________________   INITIAL IMPRESSION / ASSESSMENT AND PLAN / ED COURSE  Pertinent labs & imaging results that were available during my care of the patient were reviewed by me and considered in my medical decision making (see chart for details).  83 year old female with PMH as noted above presents with generalized weakness over the last 4 to 5 days, with some possible increased left-sided weakness but no numbness to that side.  The patient is reported some difficulty with balance.  On exam, the patient is relatively well-appearing for her age.  Vital signs are normal except for hypertension.  The remainder of the exam is relatively unremarkable.  The patient has no focal neuro deficit at this time.  Differential includes possible TIA or resolving acute stroke, UTI or other infection, dehydration or other metabolic etiology, or less likely cardiac.  We will obtain CT head, lab work-up, and reassess.  ----------------------------------------- 12:29 PM on 09/05/2018 -----------------------------------------  The patient's lab work-up is unremarkable except for borderline abnormalities in bicarb and chloride which do not appear to  be clinically significant.  Her creatinine is slightly improved from baseline.  UA is negative, and the CT shows no signs of stroke.  Given the duration of the symptoms I would expect to find a CT finding if the  patient had recent ischemic stroke.  Overall I suspect benign etiology such as dehydration.  The patient is feeling well, and feels comfortable going home.  The daughter agrees with the plan.  I counseled them on the results of the work-up.  I instructed them to make an appointment to follow-up with the primary care doctor as soon as possible.  Return precautions given, and they expressed understanding. ____________________________________________   FINAL CLINICAL IMPRESSION(S) / ED DIAGNOSES  Final diagnoses:  Generalized weakness      NEW MEDICATIONS STARTED DURING THIS VISIT:  New Prescriptions   No medications on file     Note:  This document was prepared using Dragon voice recognition software and may include unintentional dictation errors.    Arta Silence, MD 09/05/18 1230

## 2018-09-05 NOTE — ED Triage Notes (Signed)
Pt to ED via POV with her daughter who states that pt has had some altered mental status, lack of coordination, drooling, and being "off". Pt dtr states that this morning is the first morning that she has noticed that pt is feeling some better but she is still off balance. Pt hx/o kidney disease. Pt is in NAD at this time. Sx's first started on Wednesday.

## 2018-09-05 NOTE — ED Notes (Signed)
IV to right wrist removed by this RN, catheter intact. No site complications

## 2018-09-13 DIAGNOSIS — R5383 Other fatigue: Secondary | ICD-10-CM | POA: Diagnosis not present

## 2018-09-15 DIAGNOSIS — I208 Other forms of angina pectoris: Secondary | ICD-10-CM | POA: Diagnosis not present

## 2018-09-15 DIAGNOSIS — I10 Essential (primary) hypertension: Secondary | ICD-10-CM | POA: Diagnosis not present

## 2018-09-15 DIAGNOSIS — I2510 Atherosclerotic heart disease of native coronary artery without angina pectoris: Secondary | ICD-10-CM | POA: Diagnosis not present

## 2018-09-15 DIAGNOSIS — E785 Hyperlipidemia, unspecified: Secondary | ICD-10-CM | POA: Diagnosis not present

## 2018-09-15 DIAGNOSIS — N184 Chronic kidney disease, stage 4 (severe): Secondary | ICD-10-CM | POA: Diagnosis not present

## 2018-09-15 DIAGNOSIS — E1122 Type 2 diabetes mellitus with diabetic chronic kidney disease: Secondary | ICD-10-CM | POA: Diagnosis not present

## 2018-09-15 DIAGNOSIS — I480 Paroxysmal atrial fibrillation: Secondary | ICD-10-CM | POA: Diagnosis not present

## 2018-09-15 DIAGNOSIS — R011 Cardiac murmur, unspecified: Secondary | ICD-10-CM | POA: Diagnosis not present

## 2018-09-15 DIAGNOSIS — I5022 Chronic systolic (congestive) heart failure: Secondary | ICD-10-CM | POA: Diagnosis not present

## 2018-09-15 DIAGNOSIS — Z951 Presence of aortocoronary bypass graft: Secondary | ICD-10-CM | POA: Diagnosis not present

## 2018-09-16 ENCOUNTER — Other Ambulatory Visit (HOSPITAL_COMMUNITY): Payer: Self-pay | Admitting: Internal Medicine

## 2018-09-20 DIAGNOSIS — I5022 Chronic systolic (congestive) heart failure: Secondary | ICD-10-CM | POA: Diagnosis not present

## 2018-09-20 DIAGNOSIS — E1129 Type 2 diabetes mellitus with other diabetic kidney complication: Secondary | ICD-10-CM | POA: Diagnosis not present

## 2018-09-20 DIAGNOSIS — N184 Chronic kidney disease, stage 4 (severe): Secondary | ICD-10-CM | POA: Diagnosis not present

## 2018-09-20 DIAGNOSIS — R600 Localized edema: Secondary | ICD-10-CM | POA: Diagnosis not present

## 2018-09-20 DIAGNOSIS — I10 Essential (primary) hypertension: Secondary | ICD-10-CM | POA: Diagnosis not present

## 2018-09-22 DIAGNOSIS — Z951 Presence of aortocoronary bypass graft: Secondary | ICD-10-CM | POA: Diagnosis not present

## 2018-09-22 DIAGNOSIS — I2510 Atherosclerotic heart disease of native coronary artery without angina pectoris: Secondary | ICD-10-CM | POA: Diagnosis not present

## 2018-09-22 DIAGNOSIS — I208 Other forms of angina pectoris: Secondary | ICD-10-CM | POA: Diagnosis not present

## 2018-10-04 DIAGNOSIS — I5022 Chronic systolic (congestive) heart failure: Secondary | ICD-10-CM | POA: Diagnosis not present

## 2018-10-04 DIAGNOSIS — R5383 Other fatigue: Secondary | ICD-10-CM | POA: Diagnosis not present

## 2018-10-06 ENCOUNTER — Encounter: Payer: Self-pay | Admitting: *Deleted

## 2018-10-07 ENCOUNTER — Ambulatory Visit
Admission: RE | Admit: 2018-10-07 | Discharge: 2018-10-07 | Disposition: A | Discharge status: Home / Self Care | Payer: Medicare HMO | Source: Ambulatory Visit | Attending: Gastroenterology | Admitting: Gastroenterology

## 2018-10-07 ENCOUNTER — Encounter
Admission: RE | Disposition: A | Discharge status: Home / Self Care | Payer: Self-pay | Source: Ambulatory Visit | Attending: Gastroenterology

## 2018-10-07 ENCOUNTER — Encounter: Payer: Self-pay | Admitting: *Deleted

## 2018-10-07 ENCOUNTER — Ambulatory Visit: Payer: Medicare HMO | Admitting: Anesthesiology

## 2018-10-07 DIAGNOSIS — Z953 Presence of xenogenic heart valve: Secondary | ICD-10-CM | POA: Insufficient documentation

## 2018-10-07 DIAGNOSIS — E1139 Type 2 diabetes mellitus with other diabetic ophthalmic complication: Secondary | ICD-10-CM | POA: Diagnosis not present

## 2018-10-07 DIAGNOSIS — K2970 Gastritis, unspecified, without bleeding: Secondary | ICD-10-CM | POA: Diagnosis not present

## 2018-10-07 DIAGNOSIS — Z86010 Personal history of colonic polyps: Secondary | ICD-10-CM | POA: Diagnosis not present

## 2018-10-07 DIAGNOSIS — K3189 Other diseases of stomach and duodenum: Secondary | ICD-10-CM | POA: Diagnosis not present

## 2018-10-07 DIAGNOSIS — E1122 Type 2 diabetes mellitus with diabetic chronic kidney disease: Secondary | ICD-10-CM | POA: Diagnosis not present

## 2018-10-07 DIAGNOSIS — K862 Cyst of pancreas: Secondary | ICD-10-CM | POA: Diagnosis not present

## 2018-10-07 DIAGNOSIS — R011 Cardiac murmur, unspecified: Secondary | ICD-10-CM | POA: Insufficient documentation

## 2018-10-07 DIAGNOSIS — H42 Glaucoma in diseases classified elsewhere: Secondary | ICD-10-CM | POA: Diagnosis not present

## 2018-10-07 DIAGNOSIS — Z538 Procedure and treatment not carried out for other reasons: Secondary | ICD-10-CM | POA: Insufficient documentation

## 2018-10-07 DIAGNOSIS — Z79899 Other long term (current) drug therapy: Secondary | ICD-10-CM | POA: Diagnosis not present

## 2018-10-07 DIAGNOSIS — K228 Other specified diseases of esophagus: Secondary | ICD-10-CM | POA: Diagnosis not present

## 2018-10-07 DIAGNOSIS — I2510 Atherosclerotic heart disease of native coronary artery without angina pectoris: Secondary | ICD-10-CM | POA: Diagnosis not present

## 2018-10-07 DIAGNOSIS — Z951 Presence of aortocoronary bypass graft: Secondary | ICD-10-CM | POA: Diagnosis not present

## 2018-10-07 DIAGNOSIS — E785 Hyperlipidemia, unspecified: Secondary | ICD-10-CM | POA: Diagnosis not present

## 2018-10-07 DIAGNOSIS — K208 Other esophagitis: Secondary | ICD-10-CM | POA: Diagnosis not present

## 2018-10-07 DIAGNOSIS — I4891 Unspecified atrial fibrillation: Secondary | ICD-10-CM | POA: Insufficient documentation

## 2018-10-07 DIAGNOSIS — K635 Polyp of colon: Secondary | ICD-10-CM | POA: Diagnosis not present

## 2018-10-07 DIAGNOSIS — Z7982 Long term (current) use of aspirin: Secondary | ICD-10-CM | POA: Insufficient documentation

## 2018-10-07 DIAGNOSIS — R1030 Lower abdominal pain, unspecified: Secondary | ICD-10-CM | POA: Diagnosis not present

## 2018-10-07 DIAGNOSIS — I10 Essential (primary) hypertension: Secondary | ICD-10-CM | POA: Diagnosis not present

## 2018-10-07 DIAGNOSIS — K2950 Unspecified chronic gastritis without bleeding: Secondary | ICD-10-CM | POA: Diagnosis not present

## 2018-10-07 DIAGNOSIS — I129 Hypertensive chronic kidney disease with stage 1 through stage 4 chronic kidney disease, or unspecified chronic kidney disease: Secondary | ICD-10-CM | POA: Diagnosis not present

## 2018-10-07 DIAGNOSIS — K2900 Acute gastritis without bleeding: Secondary | ICD-10-CM | POA: Diagnosis not present

## 2018-10-07 DIAGNOSIS — N184 Chronic kidney disease, stage 4 (severe): Secondary | ICD-10-CM | POA: Diagnosis not present

## 2018-10-07 DIAGNOSIS — R933 Abnormal findings on diagnostic imaging of other parts of digestive tract: Secondary | ICD-10-CM | POA: Diagnosis not present

## 2018-10-07 HISTORY — PX: COLONOSCOPY WITH PROPOFOL: SHX5780

## 2018-10-07 HISTORY — DX: Hyperlipidemia, unspecified: E78.5

## 2018-10-07 HISTORY — PX: ESOPHAGOGASTRODUODENOSCOPY (EGD) WITH PROPOFOL: SHX5813

## 2018-10-07 SURGERY — COLONOSCOPY WITH PROPOFOL
Anesthesia: General

## 2018-10-07 MED ORDER — SODIUM CHLORIDE 0.9 % IV SOLN
INTRAVENOUS | Status: AC
  Filled 2018-10-07: qty 2000

## 2018-10-07 MED ORDER — SODIUM CHLORIDE 0.9 % IV SOLN: INTRAVENOUS | Status: DC

## 2018-10-07 MED ORDER — SODIUM CHLORIDE 0.9 % IV SOLN
INTRAVENOUS | Status: DC
  Administered 2018-10-07: 1000 mL via INTRAVENOUS

## 2018-10-07 MED ORDER — PROPOFOL 10 MG/ML IV BOLUS
INTRAVENOUS | Status: DC | PRN
  Administered 2018-10-07: 50 mg via INTRAVENOUS

## 2018-10-07 MED ORDER — SODIUM CHLORIDE 0.9 % IV SOLN
2.0000 g | Freq: Once | INTRAVENOUS | Status: AC
  Administered 2018-10-07: 2 g via INTRAVENOUS

## 2018-10-07 MED ORDER — PROPOFOL 500 MG/50ML IV EMUL
INTRAVENOUS | Status: DC | PRN
  Administered 2018-10-07: 120 ug/kg/min via INTRAVENOUS

## 2018-10-07 NOTE — Anesthesia Preprocedure Evaluation (Signed)
Anesthesia Evaluation  Patient identified by MRN, date of birth, ID band Patient awake    Reviewed: Allergy & Precautions, NPO status , Patient's Chart, lab work & pertinent test results, reviewed documented beta blocker date and time   Airway Mallampati: II  TM Distance: >3 FB     Dental  (+) Upper Dentures, Lower Dentures   Pulmonary           Cardiovascular hypertension, Pt. on medications and Pt. on home beta blockers + angina + CAD and + CABG  + dysrhythmias Atrial Fibrillation + Valvular Problems/Murmurs      Neuro/Psych    GI/Hepatic   Endo/Other  diabetes, Type 2  Renal/GU Renal disease     Musculoskeletal   Abdominal   Peds  Hematology  (+) anemia ,   Anesthesia Other Findings echodone one yr only 30. Mitral valve replace. EKG poor R waves. She walks with a cane..no stents.  Reproductive/Obstetrics                             Anesthesia Physical Anesthesia Plan  ASA: III  Anesthesia Plan: General   Post-op Pain Management:    Induction: Intravenous  PONV Risk Score and Plan:   Airway Management Planned:   Additional Equipment:   Intra-op Plan:   Post-operative Plan:   Informed Consent: I have reviewed the patients History and Physical, chart, labs and discussed the procedure including the risks, benefits and alternatives for the proposed anesthesia with the patient or authorized representative who has indicated his/her understanding and acceptance.     Plan Discussed with: CRNA  Anesthesia Plan Comments:         Anesthesia Quick Evaluation

## 2018-10-07 NOTE — Anesthesia Post-op Follow-up Note (Signed)
Anesthesia QCDR form completed.        

## 2018-10-07 NOTE — Transfer of Care (Signed)
Immediate Anesthesia Transfer of Care Note  Patient: [name] [name]  Procedure(s) Performed: COLONOSCOPY WITH PROPOFOL (N/A ) ESOPHAGOGASTRODUODENOSCOPY (EGD) WITH PROPOFOL (N/A )  Patient Location: PACU and Endoscopy Unit  Anesthesia Type:General  Level of Consciousness: sedated  Airway & Oxygen Therapy: Patient Spontanous Breathing and Patient connected to nasal cannula oxygen  Post-op Assessment: Report given to RN and Post -op Vital signs reviewed and stable  Post vital signs: Reviewed and stable  Last Vitals:  Vitals Value Taken Time  BP    Temp    Pulse 79 10/07/2018 11:17 AM  Resp 23 10/07/2018 11:17 AM  SpO2 97 % 10/07/2018 11:17 AM  Vitals shown include unvalidated device data.  Last Pain:  Vitals:   10/07/18 0934  TempSrc: Tympanic  PainSc: 0-No pain         Complications: No apparent anesthesia complications

## 2018-10-07 NOTE — Anesthesia Postprocedure Evaluation (Signed)
Anesthesia Post Note  Patient: [name] [name]  Procedure(s) Performed: COLONOSCOPY WITH PROPOFOL (N/A ) ESOPHAGOGASTRODUODENOSCOPY (EGD) WITH PROPOFOL (N/A )  Patient location during evaluation: Endoscopy Anesthesia Type: General Level of consciousness: awake and alert Pain management: pain level controlled Vital Signs Assessment: post-procedure vital signs reviewed and stable Respiratory status: spontaneous breathing, nonlabored ventilation, respiratory function stable and patient connected to nasal cannula oxygen Cardiovascular status: blood pressure returned to baseline and stable Postop Assessment: no apparent nausea or vomiting Anesthetic complications: no     Last Vitals:  Vitals:   10/07/18 0934 10/07/18 1117  BP: (!) 173/101   Pulse: 76   Resp: 16 16  Temp: (!) 35.3 C (!) 36.1 C  SpO2: 100% 100%    Last Pain:  Vitals:   10/07/18 1117  TempSrc: Tympanic  PainSc:                  Shalev Helminiak S

## 2018-10-07 NOTE — H&P (Addendum)
Outpatient short stay form Pre-procedure 10/07/2018 10:12 AM [name] Sails MD  Primary Physician: [name] Richards, MD  Reason for visit: EGD and colonoscopy  History of present illness: Patient is a 83 year old female presenting today as above.  She has a history of an abnormal CT scan showing thickening of the bottom of the stomach/distal stomach.  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  He has been experiencing some sharp lower abdominal pains that do not seem to be connected with having a bowel movement or not having a bowel movement.  He has a history of iron deficiency anemia as well as a recent episode of diverticulitis, this noted on a CT scan in lie of 2018.  Patient also has a history of CABG x4 as well as a apparently bioprosthetic mitral valve replacement for mitral valve insufficiency on February 2018.  She is no longer on a blood thinner with the exception of 81 mg aspirin.  She tolerated her prep well.    Current Facility-Administered Medications:  .  0.9 %  sodium chloride infusion, , Intravenous, Continuous, [name] Sails, MD, Last Rate: 20 mL/hr at 10/07/18 0959, 1,000 mL at 10/07/18 0959  Medications Prior to Admission  Medication Sig Dispense Refill Last Dose  . acetaminophen (TYLENOL) 500 MG tablet Take 500 mg by mouth every 6 (six) hours as needed for mild pain.    Past Week at Unknown time  . aspirin EC 81 MG tablet Take 1 tablet (81 mg total) by mouth daily. 90 tablet 3 10/06/2018 at Unknown time  . carvedilol (COREG) 3.125 MG tablet TAKE 1 TABLET BY MOUTH TWICE DAILY 180 tablet 3 10/06/2018 at Unknown time  . furosemide (LASIX) 40 MG tablet Take 40 mg Daily, alternating with 80 mg Daily. 45 tablet 3 10/06/2018 at Unknown time  . isosorbide mononitrate (IMDUR) 30 MG 24 hr tablet TAKE 1 TABLET BY MOUTH ONCE DAILY 90 tablet 3 10/06/2018 at Unknown time  . latanoprost (XALATAN) 0.005 % ophthalmic solution Place 1 drop into both eyes at bedtime.     10/06/2018 at Unknown time  . omeprazole (PRILOSEC) 20 MG capsule Take 20 mg by mouth daily.   Past Week at Unknown time  . Potassium Chloride ER 20 MEQ TBCR TAKE 1 TABLET BY MOUTH ONCE DAILY 90 tablet 1 Past Week at Unknown time  . rosuvastatin (CRESTOR) 40 MG tablet Take 40 mg by mouth daily.   Past Week at Unknown time  . timolol (BETIMOL) 0.5 % ophthalmic solution Place 1 drop into both eyes 2 (two) times daily.   10/06/2018 at Unknown time     No Known Allergies   Past Medical History:  Diagnosis Date  . [name]    a. LHC 02/03/17 for unstable angina showed severe 3v CAD w/ sequential 70% & 80% p-mLAD, 99% mLCx, CTO mRCA, elevated LV filling pressure  . Colon polyp 2010  . Diabetes mellitus with complication ([place])   . Glaucoma   . Hyperlipidemia   . Hypertension     Review of systems:      Physical Exam    Heart and lungs: Regular rate and rhythm without rub or gallop, lungs are bilaterally clear.    HEENT: Normocephalic atraumatic eyes are anicteric    Other:    Pertinant exam for procedure: Soft nontender nondistended bowel sounds positive normoactive    Planned proceedures: EGD, colonoscopy and indicated procedures. I have discussed the risks benefits and complications of procedures to include  not limited to bleeding, infection, perforation and the risk of sedation and the patient wishes to proceed.  Per patients recommendation will give Abx prophylaxis.    [name] Sails, MD Gastroenterology 10/07/2018  10:12 AM

## 2018-10-07 NOTE — Op Note (Signed)
[facility] Gastroenterology Patient Name: [name] [name] Procedure Date: 10/07/2018 10:09 AM MRN: [id number] Account #: [id number] Date of Birth: 03/23/1935 Admit Type: Outpatient Age: 83 Room: [facility] ENDO ROOM 1 Gender: Female Note Status: Finalized Procedure:            Colonoscopy Indications:          Lower abdominal pain Providers:            Lollie Sails, MD Referring MD:         Ocie Cornfield. Ouida Sills MD, MD (Referring MD) Medicines:            Monitored Anesthesia Care Complications:        No immediate complications. Procedure:            Pre-Anesthesia Assessment:                       - ASA Grade Assessment: III - A patient with severe                        systemic disease.                       After obtaining informed consent, the colonoscope was                        passed under direct vision. Throughout the procedure,                        the patient's blood pressure, pulse, and oxygen                        saturations were monitored continuously. The                        Colonoscope was introduced through the anus with the                        intention of advancing to the cecum. The scope was                        advanced to the sigmoid colon before the procedure was                        aborted. Medications were given. The colonoscopy was                        extremely difficult due to poor bowel prep with stool                        present. The patient tolerated the procedure well. The                        quality of the bowel preparation was poor. Findings:      A large amount of semi-liquid stool was found in the rectum and in the       sigmoid colon, precluding visualization, unable to rinse clear. Impression:           - Preparation of the colon was poor.                       -  Stool in the rectum and in the sigmoid colon.                       - No specimens collected. Recommendation:       - Discharge patient to  home.                       - repeat prep and reschedule.                       - Discharge patient to home. Procedure Code(s):    --- Professional ---                       [phone], 41, Colonoscopy, flexible; diagnostic, including                        collection of specimen(s) by brushing or washing, when                        performed (separate procedure) CPT copyright 2018 American Medical Association. All rights reserved. The codes documented in this report are preliminary and upon coder review may  be revised to meet current compliance requirements. Lollie Sails, MD 10/07/2018 11:14:53 AM This report has been signed electronically. Number of Addenda: 0 Note Initiated On: 10/07/2018 10:09 AM Total Procedure Duration: 0 hours 1 minute 3 seconds       [facility]

## 2018-10-07 NOTE — Progress Notes (Signed)
Colonoscopy aborted at sigmoid colon due to poor prep.

## 2018-10-07 NOTE — Op Note (Signed)
[facility] Gastroenterology Patient Name: [name] [name] Procedure Date: 10/07/2018 10:10 AM MRN: [id number] Account #: [id number] Date of Birth: January 12, 1935 Admit Type: Outpatient Age: 83 Room: [facility] ENDO ROOM 1 Gender: Female Note Status: Finalized Procedure:            Upper GI endoscopy Indications:          Lower abdominal pain, Abnormal CT of the GI tract Providers:            Lollie Sails, MD Referring MD:         Ocie Cornfield. Ouida Sills MD, MD (Referring MD) Medicines:            Monitored Anesthesia Care Complications:        No immediate complications. Procedure:            Pre-Anesthesia Assessment:                       - ASA Grade Assessment: III - A patient with severe                        systemic disease.                       After obtaining informed consent, the endoscope was                        passed under direct vision. Throughout the procedure,                        the patient's blood pressure, pulse, and oxygen                        saturations were monitored continuously. The Endoscope                        was introduced through the mouth, and advanced to the                        third part of duodenum. The upper GI endoscopy was                        accomplished without difficulty. The patient tolerated                        the procedure well. Findings:      The Z-line was variable. Biopsies were taken with a cold forceps for       histology.      The exam of the esophagus was otherwise normal.      Diffuse and patchy moderate inflammation characterized by congestion       (edema), erosions, erythema and granularity was found in the gastric       antrum. Biopsies were taken with a cold forceps for histology. Biopsies       were taken with a cold forceps for Helicobacter pylori testing.      Localized nodular mucosa was found at the incisura and on the anterior       wall of the gastric antrum. Biopsies were taken with  a cold forceps for       histology.      The cardia and gastric fundus were normal on retroflexion.  The examined duodenum was normal. Impression:           - Z-line variable. Biopsied.                       - Erosive gastritis. Biopsied.                       - Nodular mucosa in the incisura and in the gastric                        antrum (anterior wall). Biopsied.                       - Normal examined duodenum. Recommendation:       - Await pathology results.                       - Use Protonix (pantoprazole) 40 mg PO BID for 4 weeks.                       - Use Protonix (pantoprazole) 40 mg PO daily.                       - Return to GI clinic in 4 weeks. Procedure Code(s):    --- Professional ---                       [phone], Esophagogastroduodenoscopy, flexible, transoral;                        with biopsy, single or multiple Diagnosis Code(s):    --- Professional ---                       K22.8, Other specified diseases of esophagus                       K29.60, Other gastritis without bleeding                       K31.89, Other diseases of stomach and duodenum                       R10.30, Lower abdominal pain, unspecified                       R93.3, Abnormal findings on diagnostic imaging of other                        parts of digestive tract CPT copyright 2018 American Medical Association. All rights reserved. The codes documented in this report are preliminary and upon coder review may  be revised to meet current compliance requirements. Lollie Sails, MD 10/07/2018 11:07:41 AM This report has been signed electronically. Number of Addenda: 0 Note Initiated On: 10/07/2018 10:10 AM      [facility]

## 2018-10-09 LAB — SURGICAL PATHOLOGY

## 2018-10-14 ENCOUNTER — Encounter: Payer: Self-pay | Admitting: Emergency Medicine

## 2018-10-14 ENCOUNTER — Ambulatory Visit
Admission: EM | Admit: 2018-10-14 | Discharge: 2018-10-14 | Disposition: A | Discharge status: Home / Self Care | Payer: Medicare HMO | Attending: Family Medicine | Admitting: Family Medicine

## 2018-10-14 ENCOUNTER — Other Ambulatory Visit: Payer: Self-pay

## 2018-10-14 ENCOUNTER — Other Ambulatory Visit (HOSPITAL_COMMUNITY): Payer: Self-pay | Admitting: Cardiology

## 2018-10-14 DIAGNOSIS — M5442 Lumbago with sciatica, left side: Secondary | ICD-10-CM

## 2018-10-14 DIAGNOSIS — M5441 Lumbago with sciatica, right side: Secondary | ICD-10-CM | POA: Diagnosis not present

## 2018-10-14 MED ORDER — TRAMADOL HCL 50 MG PO TABS: 50.0000 mg | ORAL_TABLET | Freq: Three times a day (TID) | ORAL | 0 refills | Status: DC | PRN

## 2018-10-14 NOTE — Discharge Instructions (Signed)
Use the pain medication as needed.  Take care  Dr. Lacinda Axon

## 2018-10-14 NOTE — ED Triage Notes (Signed)
Patient in today c/o back pain that radiates to her hip and left x 3-4 weeks. Patient states it is getting worse and waking her up at night. Patient has tried OTC Aspercreme without relief.

## 2018-10-14 NOTE — ED Provider Notes (Signed)
[facility]    CSN: [id number] Arrival date & time: 10/14/18  7782   History   Chief Complaint Chief Complaint  Patient presents with  . Back Pain   HPI  83 year old female presents with the above complaint.  Patient reports a several week history of pain.  She reports that the pain starts in her back and buttock and radiates down her legs bilaterally.  No reports of incontinence or saddle anesthesia.  Patient states that her pain is now keeping her up at night.  Patient had a fall 1 week ago.  Patient states that she fell and landed directly on her bottom.  She has been using Aspercreme without improvement.  She is also taken aspirin without improvement.  She states that it seems to be worse with sitting and lying down.  No relieving factors.  No other reported symptoms.  No other complaints.  PMH, Surgical Hx, Family Hx, Social History reviewed and updated as below.  Past Medical History:  Diagnosis Date  . [name]    a. LHC 02/03/17 for unstable angina showed severe 3v CAD w/ sequential 70% & 80% p-mLAD, 99% mLCx, CTO mRCA, elevated LV filling pressure  . Colon polyp 2010  . Diabetes mellitus with complication ([place])   . Glaucoma   . Hyperlipidemia   . Hypertension     Patient Active Problem List   Diagnosis Date Noted  . Hypercalcemia 06/28/2017  . PAF (paroxysmal atrial fibrillation) ([place]) 03/26/2017  . CKD (chronic kidney disease), stage IV ([place]) 03/26/2017  . [name]   . [name] 02/05/2017  . Unstable angina ([place]) 02/03/2017  . [name] 02/03/2017  . Murmur 02/03/2017  . Type 2 diabetes mellitus with complication ([place]) 42/35/3614  . Essential hypertension 02/03/2017  . HLD (hyperlipidemia) 04/16/2016  . Absolute anemia 09/19/2014  . Encounter for screening colonoscopy 09/19/2014    Past Surgical History:  Procedure Laterality Date  . APPENDECTOMY    . BREAST BIOPSY Right 1999  . COLONOSCOPY   2010  . COLONOSCOPY WITH PROPOFOL N/A 10/07/2018   Procedure: COLONOSCOPY WITH PROPOFOL;  Surgeon: Lollie Sails, MD;  Location: [facility] ENDOSCOPY;  Service: Endoscopy;  Laterality: N/A;  . CORONARY ANGIOPLASTY    . CORONARY ARTERY BYPASS GRAFT N/A 02/05/2017   Procedure: CORONARY ARTERY BYPASS GRAFTING (CABG) x 4                                                                               LIMA-LAD SEQ SVG-DIAG-OM SVG-PD;  Surgeon: Gaye Pollack, MD;  Location: [place] OR;  Service: Open Heart Surgery;  Laterality: N/A;  . ESOPHAGOGASTRODUODENOSCOPY (EGD) WITH PROPOFOL N/A 10/07/2018   Procedure: ESOPHAGOGASTRODUODENOSCOPY (EGD) WITH PROPOFOL;  Surgeon: Lollie Sails, MD;  Location: [facility] ENDOSCOPY;  Service: Endoscopy;  Laterality: N/A;  . IABP INSERTION N/A 02/04/2017   Procedure: IABP Insertion;  Surgeon: Leonie Man, MD;  Location: [place] CV LAB;  Service: Cardiovascular;  Laterality: N/A;  . LEFT HEART CATH AND CORONARY ANGIOGRAPHY Left 02/03/2017   Procedure: Left Heart Cath and Coronary Angiography;  Surgeon: Nelva Bush, MD;  Location: [place] CV LAB;  Service:  Cardiovascular;  Laterality: Left;  . MITRAL VALVE REPAIR N/A 02/05/2017   Procedure: MITRAL VALVE REPLACEMENT (MVR) WITH MAGNA MITRAL EASE PERICARDIAL BIOPROSTHESIS 25 MM;  Surgeon: Gaye Pollack, MD;  Location: [place] OR;  Service: Open Heart Surgery;  Laterality: N/A;  . PARATHYROIDECTOMY     1986  . RIGHT HEART CATH N/A 02/04/2017   Procedure: Right Heart Cath;  Surgeon: Leonie Man, MD;  Location: [place] [facility] LAB;  Service: Cardiovascular;  Laterality: N/A;  . TEE WITHOUT CARDIOVERSION N/A 02/05/2017   Procedure: TRANSESOPHAGEAL ECHOCARDIOGRAM (TEE);  Surgeon: Gaye Pollack, MD;  Location: [place];  Service: Open Heart Surgery;  Laterality: N/A;    OB History    Gravida  4   Para  4   Term      Preterm      AB      Living  4     SAB      TAB      Ectopic      Multiple      Live Births            Obstetric Comments  1st Menstrual Cycle:  ** 1st Pregnancy:  20         Home Medications    Prior to Admission medications   Medication Sig Start Date End Date Taking? Authorizing Provider  acetaminophen (TYLENOL) 500 MG tablet Take 500 mg by mouth every 6 (six) hours as needed for mild pain.    Yes [provider]  aspirin EC 81 MG tablet Take 1 tablet (81 mg total) by mouth daily. 10/21/17  Yes Bensimhon, Shaune Pascal, MD  carvedilol (COREG) 3.125 MG tablet TAKE 1 TABLET BY MOUTH TWICE DAILY 09/25/17  Yes Larey Dresser, MD  furosemide (LASIX) 40 MG tablet Take 40 mg Daily, alternating with 80 mg Daily. 01/18/18  Yes Bensimhon, Shaune Pascal, MD  isosorbide mononitrate (IMDUR) 30 MG 24 hr tablet TAKE 1 TABLET BY MOUTH ONCE DAILY 09/25/17  Yes Larey Dresser, MD  latanoprost (XALATAN) 0.005 % ophthalmic solution Place 1 drop into both eyes at bedtime.    Yes [provider]  omeprazole (PRILOSEC) 20 MG capsule Take 20 mg by mouth daily.   Yes [provider]  Potassium Chloride ER 20 MEQ TBCR TAKE 1 TABLET BY MOUTH ONCE DAILY 09/16/18  Yes Bensimhon, Shaune Pascal, MD  rosuvastatin (CRESTOR) 40 MG tablet Take 40 mg by mouth daily.   Yes [provider]  timolol (BETIMOL) 0.5 % ophthalmic solution Place 1 drop into both eyes 2 (two) times daily.   Yes [provider]  traMADol (ULTRAM) 50 MG tablet Take 1 tablet (50 mg total) by mouth every 8 (eight) hours as needed. 10/14/18   Coral Spikes, DO    Family History Family History  Problem Relation Age of Onset  . Diabetes Father   . Diabetes Sister   . Bladder Cancer Neg Hx   . Kidney cancer Neg Hx   . Prostate cancer Neg Hx     Social History Social History   Tobacco Use  . Smoking status: Never Smoker  . Smokeless tobacco: Never Used  Substance Use Topics  . Alcohol use: No  . Drug use: Never     Allergies   Patient has no known allergies.   Review of Systems Review of  Systems  Constitutional: Negative.   Musculoskeletal: Positive for back pain.   Physical Exam Triage Vital Signs ED Triage Vitals [10/14/18  0929]  Enc Vitals Group     BP (!) 184/130     Pulse Rate 89     Resp 16     Temp 97.7 F (36.5 C)     Temp Source Oral     SpO2 100 %     Weight 137 lb (62.1 kg)     Height 5\' 6"  (1.676 m)     Head Circumference      Peak Flow      Pain Score 10     Pain Loc      Pain Edu?      Excl. in [place]?    Updated Vital Signs BP (!) 183/130 (BP Location: Right Arm)   Pulse 89   Temp 97.7 F (36.5 C) (Oral)   Resp 16   Ht 5\' 6"  (1.676 m)   Wt 62.1 kg   SpO2 100%   BMI 22.11 kg/m   Visual Acuity Right Eye Distance:   Left Eye Distance:   Bilateral Distance:    Right Eye Near:   Left Eye Near:    Bilateral Near:     Physical Exam  Constitutional: She is oriented to person, place, and time. She appears well-developed. No distress.  HENT:  Head: Normocephalic and atraumatic.  Cardiovascular: Normal rate and regular rhythm.  Pulmonary/Chest: Effort normal. No respiratory distress.  Musculoskeletal:  No discrete areas of tenderness of the lumbar spine.  Negative straight leg raise.  Negative FADIR.  Neurological: She is alert and oriented to person, place, and time.  Psychiatric: Her behavior is normal.  Flat affect.  Nursing note and vitals reviewed.  UC Treatments / Results  Labs (all labs ordered are listed, but only abnormal results are displayed) Labs Reviewed - No data to display  EKG None  Radiology No results found.  Procedures Procedures (including critical care time)  Medications Ordered in UC Medications - No data to display  Initial Impression / Assessment and Plan / UC Course  I have reviewed the triage vital signs and the nursing notes.  Pertinent labs & imaging results that were available during my care of the patient were reviewed by me and considered in my medical decision making (see chart for  details).    83 year old female presents with back pain.  Given comorbidities (Heart disease, Diabetes), I am treating her with tramadol as opposed to anti-inflammatories or steroids.  Final Clinical Impressions(s) / UC Diagnoses   Final diagnoses:  Acute bilateral low back pain with bilateral sciatica     Discharge Instructions     Use the pain medication as needed.  Take care  Dr. Lacinda Axon    ED Prescriptions    Medication Sig Dispense Auth. Provider   traMADol (ULTRAM) 50 MG tablet Take 1 tablet (50 mg total) by mouth every 8 (eight) hours as needed. 15 tablet Coral Spikes, DO     Controlled Substance Prescriptions [place] Controlled Substance Registry consulted? Not Applicable   Coral Spikes, DO 10/14/18 1029

## 2018-10-19 ENCOUNTER — Other Ambulatory Visit: Payer: Self-pay

## 2018-10-19 ENCOUNTER — Emergency Department
Admission: EM | Admit: 2018-10-19 | Discharge: 2018-10-19 | Disposition: A | Discharge status: Home / Self Care | Payer: Medicare HMO | Attending: Emergency Medicine | Admitting: Emergency Medicine

## 2018-10-19 ENCOUNTER — Encounter: Payer: Self-pay | Admitting: *Deleted

## 2018-10-19 DIAGNOSIS — I10 Essential (primary) hypertension: Secondary | ICD-10-CM | POA: Diagnosis not present

## 2018-10-19 DIAGNOSIS — R42 Dizziness and giddiness: Secondary | ICD-10-CM | POA: Diagnosis not present

## 2018-10-19 DIAGNOSIS — W19XXXA Unspecified fall, initial encounter: Secondary | ICD-10-CM | POA: Diagnosis not present

## 2018-10-19 DIAGNOSIS — Y929 Unspecified place or not applicable: Secondary | ICD-10-CM | POA: Diagnosis not present

## 2018-10-19 DIAGNOSIS — Z951 Presence of aortocoronary bypass graft: Secondary | ICD-10-CM | POA: Insufficient documentation

## 2018-10-19 DIAGNOSIS — E1122 Type 2 diabetes mellitus with diabetic chronic kidney disease: Secondary | ICD-10-CM | POA: Diagnosis not present

## 2018-10-19 DIAGNOSIS — Z7982 Long term (current) use of aspirin: Secondary | ICD-10-CM | POA: Insufficient documentation

## 2018-10-19 DIAGNOSIS — I2510 Atherosclerotic heart disease of native coronary artery without angina pectoris: Secondary | ICD-10-CM | POA: Diagnosis not present

## 2018-10-19 DIAGNOSIS — Y939 Activity, unspecified: Secondary | ICD-10-CM | POA: Diagnosis not present

## 2018-10-19 DIAGNOSIS — Z79899 Other long term (current) drug therapy: Secondary | ICD-10-CM | POA: Insufficient documentation

## 2018-10-19 DIAGNOSIS — Y999 Unspecified external cause status: Secondary | ICD-10-CM | POA: Diagnosis not present

## 2018-10-19 DIAGNOSIS — N184 Chronic kidney disease, stage 4 (severe): Secondary | ICD-10-CM | POA: Diagnosis not present

## 2018-10-19 DIAGNOSIS — I129 Hypertensive chronic kidney disease with stage 1 through stage 4 chronic kidney disease, or unspecified chronic kidney disease: Secondary | ICD-10-CM | POA: Insufficient documentation

## 2018-10-19 DIAGNOSIS — E860 Dehydration: Secondary | ICD-10-CM | POA: Diagnosis not present

## 2018-10-19 DIAGNOSIS — W1789XA Other fall from one level to another, initial encounter: Secondary | ICD-10-CM | POA: Diagnosis not present

## 2018-10-19 DIAGNOSIS — R531 Weakness: Secondary | ICD-10-CM | POA: Diagnosis not present

## 2018-10-19 LAB — COMPREHENSIVE METABOLIC PANEL
ALK PHOS: 86 U/L (ref 38–126)
ALT: 13 U/L (ref 0–44)
AST: 24 U/L (ref 15–41)
Albumin: 3.5 g/dL (ref 3.5–5.0)
Anion gap: 10 (ref 5–15)
BILIRUBIN TOTAL: 4.4 mg/dL — AB (ref 0.3–1.2)
BUN: 17 mg/dL (ref 8–23)
CALCIUM: 9.4 mg/dL (ref 8.9–10.3)
CO2: 22 mmol/L (ref 22–32)
CREATININE: 1.57 mg/dL — AB (ref 0.44–1.00)
Chloride: 108 mmol/L (ref 98–111)
GFR calc non Af Amer: 30 mL/min — ABNORMAL LOW (ref >60)
GFR, EST AFRICAN AMERICAN: 35 mL/min — AB (ref >60)
GLUCOSE: 178 mg/dL — AB (ref 70–99)
Potassium: 3.2 mmol/L — ABNORMAL LOW (ref 3.5–5.1)
SODIUM: 140 mmol/L (ref 135–145)
TOTAL PROTEIN: 6.5 g/dL (ref 6.5–8.1)

## 2018-10-19 LAB — TROPONIN I: Troponin I: 0.05 ng/mL (ref <0.03)

## 2018-10-19 LAB — CBC
HEMATOCRIT: 45 % (ref 36.0–46.0)
HEMOGLOBIN: 14.7 g/dL (ref 12.0–15.0)
MCH: 29.8 pg (ref 26.0–34.0)
MCHC: 32.7 g/dL (ref 30.0–36.0)
MCV: 91.3 fL (ref 80.0–100.0)
Platelets: 196 10*3/uL (ref 150–400)
RBC: 4.93 MIL/uL (ref 3.87–5.11)
RDW: 14.4 % (ref 11.5–15.5)
WBC: 6.8 10*3/uL (ref 4.0–10.5)
nRBC: 0 % (ref 0.0–0.2)

## 2018-10-19 MED ORDER — LABETALOL HCL 5 MG/ML IV SOLN
10.0000 mg | Freq: Once | INTRAVENOUS | Status: AC
  Administered 2018-10-19: 10 mg via INTRAVENOUS
  Filled 2018-10-19: qty 4

## 2018-10-19 NOTE — ED Triage Notes (Signed)
Pt to ED via EMS after a fall this evening. Pt has had 4 reported falls this month. Today pt reported becoming lightheaded while walking and her "legs gave out from under her" No head trauma and no LOC reported. PT is alert and oriented upon arrival.   Per EMS pt was placed on 2L oxygen due to low oxygen level when fire arrived to pts home. Pt is not on oxygen at home.  Hx of HTN and Afib. Unknown recent medication change.   Diabetic CBG 114.

## 2018-10-19 NOTE — ED Provider Notes (Signed)
[facility] Emergency Department Provider Note   ____________________________________________    I have reviewed the triage vital signs and the nursing notes.   HISTORY  Chief Complaint Weakness     HPI [name] UNREIN is a 83 y.o. female who presents after a fall.  Patient reports she was standing up from the dining room table after eating in her right knee gave out on her which she states happens somewhat frequently and she slid to the floor.  She denies chest pain or palpitations.  No shortness of breath.  No head injury.  She is on a blood thinners.  She reports she has had 4 falls over the last month.  Recently she started using a walker which is helping.  She reports her knee feels fine currently.  No nausea or vomiting or abdominal pain   Past Medical History:  Diagnosis Date  . [name]    a. LHC 02/03/17 for unstable angina showed severe 3v CAD w/ sequential 70% & 80% p-mLAD, 99% mLCx, CTO mRCA, elevated LV filling pressure  . Colon polyp 2010  . Diabetes mellitus with complication ([place])   . Glaucoma   . Hyperlipidemia   . Hypertension     Patient Active Problem List   Diagnosis Date Noted  . Hypercalcemia 06/28/2017  . PAF (paroxysmal atrial fibrillation) ([place]) 03/26/2017  . CKD (chronic kidney disease), stage IV ([place]) 03/26/2017  . [name]   . [name] 02/05/2017  . Unstable angina ([place]) 02/03/2017  . [name] 02/03/2017  . Murmur 02/03/2017  . Type 2 diabetes mellitus with complication ([place]) 65/46/5035  . Essential hypertension 02/03/2017  . HLD (hyperlipidemia) 04/16/2016  . Absolute anemia 09/19/2014  . Encounter for screening colonoscopy 09/19/2014    Past Surgical History:  Procedure Laterality Date  . APPENDECTOMY    . BREAST BIOPSY Right 1999  . COLONOSCOPY  2010  . COLONOSCOPY WITH PROPOFOL N/A 10/07/2018   Procedure: COLONOSCOPY WITH PROPOFOL;  Surgeon:  Lollie Sails, MD;  Location: [facility] ENDOSCOPY;  Service: Endoscopy;  Laterality: N/A;  . CORONARY ANGIOPLASTY    . CORONARY ARTERY BYPASS GRAFT N/A 02/05/2017   Procedure: CORONARY ARTERY BYPASS GRAFTING (CABG) x 4                                                                               LIMA-LAD SEQ SVG-DIAG-OM SVG-PD;  Surgeon: Gaye Pollack, MD;  Location: [facility] OR;  Service: Open Heart Surgery;  Laterality: N/A;  . ESOPHAGOGASTRODUODENOSCOPY (EGD) WITH PROPOFOL N/A 10/07/2018   Procedure: ESOPHAGOGASTRODUODENOSCOPY (EGD) WITH PROPOFOL;  Surgeon: Lollie Sails, MD;  Location: [facility] ENDOSCOPY;  Service: Endoscopy;  Laterality: N/A;  . IABP INSERTION N/A 02/04/2017   Procedure: IABP Insertion;  Surgeon: Leonie Man, MD;  Location: [place] [facility] LAB;  Service: Cardiovascular;  Laterality: N/A;  . LEFT HEART CATH AND CORONARY ANGIOGRAPHY Left 02/03/2017   Procedure: Left Heart Cath and Coronary Angiography;  Surgeon: Nelva Bush, MD;  Location: [place] CV LAB;  Service: Cardiovascular;  Laterality: Left;  . MITRAL VALVE REPAIR N/A 02/05/2017   Procedure: MITRAL VALVE REPLACEMENT (MVR) WITH  MAGNA MITRAL EASE PERICARDIAL BIOPROSTHESIS 25 MM;  Surgeon: Gaye Pollack, MD;  Location: [place] OR;  Service: Open Heart Surgery;  Laterality: N/A;  . PARATHYROIDECTOMY     1986  . RIGHT HEART CATH N/A 02/04/2017   Procedure: Right Heart Cath;  Surgeon: Leonie Man, MD;  Location: [place] CV LAB;  Service: Cardiovascular;  Laterality: N/A;  . TEE WITHOUT CARDIOVERSION N/A 02/05/2017   Procedure: TRANSESOPHAGEAL ECHOCARDIOGRAM (TEE);  Surgeon: Gaye Pollack, MD;  Location: [facility];  Service: Open Heart Surgery;  Laterality: N/A;    Prior to Admission medications   Medication Sig Start Date End Date Taking? Authorizing Provider  acetaminophen (TYLENOL) 500 MG tablet Take 500 mg by mouth every 6 (six) hours as needed for mild pain.     [provider]  aspirin EC 81 MG tablet  Take 1 tablet (81 mg total) by mouth daily. 10/21/17   Bensimhon, Shaune Pascal, MD  carvedilol (COREG) 3.125 MG tablet TAKE 1 TABLET BY MOUTH TWICE DAILY 09/25/17   Larey Dresser, MD  furosemide (LASIX) 40 MG tablet Take 40 mg Daily, alternating with 80 mg Daily. 01/18/18   Bensimhon, Shaune Pascal, MD  isosorbide mononitrate (IMDUR) 30 MG 24 hr tablet TAKE 1 TABLET BY MOUTH ONCE DAILY 10/15/18   Larey Dresser, MD  latanoprost (XALATAN) 0.005 % ophthalmic solution Place 1 drop into both eyes at bedtime.     [provider]  omeprazole (PRILOSEC) 20 MG capsule Take 20 mg by mouth daily.    [provider]  Potassium Chloride ER 20 MEQ TBCR TAKE 1 TABLET BY MOUTH ONCE DAILY 09/16/18   Bensimhon, Shaune Pascal, MD  rosuvastatin (CRESTOR) 40 MG tablet Take 40 mg by mouth daily.    [provider]  timolol (BETIMOL) 0.5 % ophthalmic solution Place 1 drop into both eyes 2 (two) times daily.    [provider]  traMADol (ULTRAM) 50 MG tablet Take 1 tablet (50 mg total) by mouth every 8 (eight) hours as needed. 10/14/18   Coral Spikes, DO     Allergies Patient has no known allergies.  Family History  Problem Relation Age of Onset  . Diabetes Father   . Diabetes Sister   . Bladder Cancer Neg Hx   . Kidney cancer Neg Hx   . Prostate cancer Neg Hx     Social History Social History   Tobacco Use  . Smoking status: Never Smoker  . Smokeless tobacco: Never Used  Substance Use Topics  . Alcohol use: No  . Drug use: Never    Review of Systems  Constitutional: No fever/chills Eyes: No visual changes.  ENT: No sore throat. Cardiovascular: Denies chest pain. Respiratory: Denies shortness of breath. Gastrointestinal: No abdominal pain.  No nausea, no vomiting.   Genitourinary: Negative for dysuria. Musculoskeletal: Negative for back pain. Skin: Negative for rash. Neurological: Negative for headaches   ____________________________________________   PHYSICAL  EXAM:  VITAL SIGNS: ED Triage Vitals  Enc Vitals Group     BP 10/19/18 2110 (!) 190/119     Pulse Rate 10/19/18 2110 91     Resp 10/19/18 2110 16     Temp 10/19/18 2110 (!) 97.3 F (36.3 C)     Temp Source 10/19/18 2110 Oral     SpO2 10/19/18 2110 97 %     Weight 10/19/18 2111 62.1 kg (137 lb)     Height 10/19/18 2111 1.676 m (5\' 6" )  Head Circumference --      Peak Flow --      Pain Score --      Pain Loc --      Pain Edu? --      Excl. in [place]? --     Constitutional: Alert and oriented. No acute distress Eyes: Conjunctivae are normal.   Nose: No congestion/rhinnorhea. Mouth/Throat: Mucous membranes are moist.    Cardiovascular: Normal rate, regular rhythm. Grossly normal heart sounds.  Good peripheral circulation. Respiratory: Normal respiratory effort.  No retractions.  Gastrointestinal: Soft and nontender. No distention.    Musculoskeletal: No lower extremity tenderness nor edema.  Warm and well perfused.  No tenderness of the knee, full range of motion without pain.  No swelling. Neurologic:  Normal speech and language. No gross focal neurologic deficits are appreciated.  Skin:  Skin is warm, dry and intact. No rash noted. Psychiatric: Mood and affect are normal. Speech and behavior are normal.  ____________________________________________   LABS (all labs ordered are listed, but only abnormal results are displayed)  Labs Reviewed  COMPREHENSIVE METABOLIC PANEL - Abnormal; Notable for the following components:      Result Value   Potassium 3.2 (*)    Glucose, Bld 178 (*)    Creatinine, Ser 1.57 (*)    Total Bilirubin 4.4 (*)    GFR calc non Af Amer 30 (*)    GFR calc Af Amer 35 (*)    All other components within normal limits  TROPONIN I - Abnormal; Notable for the following components:   Troponin I 0.05 (*)    All other components within normal limits  CBC   ____________________________________________  EKG  ED ECG REPORT I, Lavonia Drafts, the  attending physician, personally viewed and interpreted this ECG.  Date: 10/19/2018  Rhythm: normal sinus rhythm QRS Axis: normal Intervals: Mildly prolonged QTC ST/T Wave abnormalities: normal Narrative Interpretation: no evidence of acute ischemia  ____________________________________________  RADIOLOGY  None ____________________________________________   PROCEDURES  Procedure(s) performed: No  Procedures   Critical Care performed: No ____________________________________________   INITIAL IMPRESSION / ASSESSMENT AND PLAN / ED COURSE  Pertinent labs & imaging results that were available during my care of the patient were reviewed by me and considered in my medical decision making (see chart for details).  Patient well-appearing in no acute distress.  She had a mechanical fall today.  She denies chest pain palpitations, shortness of breath, chest wall injury.  No abdominal pain nausea or vomiting.  Her right knee frequently gives out on her.  We will treat her hypertension, she apparently has not had her blood pressure medications tonight.  Mechanical fall confirmed by daughter who is arrived now.  Patient has a mildly elevated troponin but again has not had any chest pain, EKG appears unchanged.  Do not feel her troponin is clinically significant and looking back at her history she has had intermittent mildly elevated troponins in the past  Discussed with patient and daughter the need for outpatient follow-up with Dr. Ouida Sills for further evaluation    ____________________________________________   FINAL CLINICAL IMPRESSION(S) / ED DIAGNOSES  Final diagnoses:  Fall, initial encounter  Essential hypertension        Note:  This document was prepared using Dragon voice recognition software and may include unintentional dictation errors.    Lavonia Drafts, MD 10/19/18 2246

## 2018-10-21 DIAGNOSIS — N183 Chronic kidney disease, stage 3 (moderate): Secondary | ICD-10-CM | POA: Diagnosis not present

## 2018-10-21 DIAGNOSIS — R296 Repeated falls: Secondary | ICD-10-CM | POA: Diagnosis not present

## 2018-10-21 DIAGNOSIS — N184 Chronic kidney disease, stage 4 (severe): Secondary | ICD-10-CM | POA: Diagnosis not present

## 2018-10-21 DIAGNOSIS — I480 Paroxysmal atrial fibrillation: Secondary | ICD-10-CM | POA: Diagnosis not present

## 2018-10-21 DIAGNOSIS — E1149 Type 2 diabetes mellitus with other diabetic neurological complication: Secondary | ICD-10-CM | POA: Diagnosis not present

## 2018-10-21 DIAGNOSIS — M25561 Pain in right knee: Secondary | ICD-10-CM | POA: Diagnosis not present

## 2018-10-21 DIAGNOSIS — I2510 Atherosclerotic heart disease of native coronary artery without angina pectoris: Secondary | ICD-10-CM | POA: Diagnosis not present

## 2018-10-21 DIAGNOSIS — E1122 Type 2 diabetes mellitus with diabetic chronic kidney disease: Secondary | ICD-10-CM | POA: Diagnosis not present

## 2018-10-21 DIAGNOSIS — I10 Essential (primary) hypertension: Secondary | ICD-10-CM | POA: Diagnosis not present

## 2018-10-21 DIAGNOSIS — I5022 Chronic systolic (congestive) heart failure: Secondary | ICD-10-CM | POA: Diagnosis not present

## 2018-10-28 DIAGNOSIS — M25561 Pain in right knee: Secondary | ICD-10-CM | POA: Diagnosis not present

## 2018-10-28 DIAGNOSIS — G8929 Other chronic pain: Secondary | ICD-10-CM | POA: Diagnosis not present

## 2018-10-28 DIAGNOSIS — R269 Unspecified abnormalities of gait and mobility: Secondary | ICD-10-CM | POA: Diagnosis not present

## 2018-10-28 DIAGNOSIS — M1711 Unilateral primary osteoarthritis, right knee: Secondary | ICD-10-CM | POA: Diagnosis not present

## 2018-10-28 DIAGNOSIS — M25562 Pain in left knee: Secondary | ICD-10-CM | POA: Diagnosis not present

## 2018-10-28 DIAGNOSIS — M1712 Unilateral primary osteoarthritis, left knee: Secondary | ICD-10-CM | POA: Diagnosis not present

## 2018-10-28 DIAGNOSIS — M5136 Other intervertebral disc degeneration, lumbar region: Secondary | ICD-10-CM | POA: Diagnosis not present

## 2018-10-28 DIAGNOSIS — R296 Repeated falls: Secondary | ICD-10-CM | POA: Diagnosis not present

## 2018-11-04 DIAGNOSIS — R933 Abnormal findings on diagnostic imaging of other parts of digestive tract: Secondary | ICD-10-CM | POA: Diagnosis not present

## 2018-11-04 DIAGNOSIS — D509 Iron deficiency anemia, unspecified: Secondary | ICD-10-CM | POA: Diagnosis not present

## 2018-11-04 DIAGNOSIS — K862 Cyst of pancreas: Secondary | ICD-10-CM | POA: Diagnosis not present

## 2018-11-04 DIAGNOSIS — R17 Unspecified jaundice: Secondary | ICD-10-CM | POA: Diagnosis not present

## 2018-11-11 DIAGNOSIS — I5022 Chronic systolic (congestive) heart failure: Secondary | ICD-10-CM | POA: Diagnosis not present

## 2018-11-11 DIAGNOSIS — I10 Essential (primary) hypertension: Secondary | ICD-10-CM | POA: Diagnosis not present

## 2018-11-11 DIAGNOSIS — E1149 Type 2 diabetes mellitus with other diabetic neurological complication: Secondary | ICD-10-CM | POA: Diagnosis not present

## 2018-11-11 DIAGNOSIS — I480 Paroxysmal atrial fibrillation: Secondary | ICD-10-CM | POA: Diagnosis not present

## 2018-11-11 DIAGNOSIS — Z23 Encounter for immunization: Secondary | ICD-10-CM | POA: Diagnosis not present

## 2018-11-11 DIAGNOSIS — Z9989 Dependence on other enabling machines and devices: Secondary | ICD-10-CM | POA: Insufficient documentation

## 2018-11-11 DIAGNOSIS — N183 Chronic kidney disease, stage 3 (moderate): Secondary | ICD-10-CM | POA: Diagnosis not present

## 2018-11-11 DIAGNOSIS — I2510 Atherosclerotic heart disease of native coronary artery without angina pectoris: Secondary | ICD-10-CM | POA: Diagnosis not present

## 2018-11-11 DIAGNOSIS — E1122 Type 2 diabetes mellitus with diabetic chronic kidney disease: Secondary | ICD-10-CM | POA: Diagnosis not present

## 2018-11-12 DIAGNOSIS — R2681 Unsteadiness on feet: Secondary | ICD-10-CM | POA: Diagnosis not present

## 2018-11-12 DIAGNOSIS — R55 Syncope and collapse: Secondary | ICD-10-CM | POA: Diagnosis not present

## 2018-11-12 DIAGNOSIS — N390 Urinary tract infection, site not specified: Secondary | ICD-10-CM | POA: Diagnosis not present

## 2018-11-25 DIAGNOSIS — M170 Bilateral primary osteoarthritis of knee: Secondary | ICD-10-CM | POA: Diagnosis not present

## 2018-11-25 DIAGNOSIS — E785 Hyperlipidemia, unspecified: Secondary | ICD-10-CM | POA: Diagnosis not present

## 2018-11-25 DIAGNOSIS — I5022 Chronic systolic (congestive) heart failure: Secondary | ICD-10-CM | POA: Diagnosis not present

## 2018-11-25 DIAGNOSIS — M5136 Other intervertebral disc degeneration, lumbar region: Secondary | ICD-10-CM | POA: Diagnosis not present

## 2018-11-25 DIAGNOSIS — I2510 Atherosclerotic heart disease of native coronary artery without angina pectoris: Secondary | ICD-10-CM | POA: Diagnosis not present

## 2018-11-25 DIAGNOSIS — I130 Hypertensive heart and chronic kidney disease with heart failure and stage 1 through stage 4 chronic kidney disease, or unspecified chronic kidney disease: Secondary | ICD-10-CM | POA: Diagnosis not present

## 2018-11-25 DIAGNOSIS — N183 Chronic kidney disease, stage 3 (moderate): Secondary | ICD-10-CM | POA: Diagnosis not present

## 2018-11-25 DIAGNOSIS — E1122 Type 2 diabetes mellitus with diabetic chronic kidney disease: Secondary | ICD-10-CM | POA: Diagnosis not present

## 2018-11-25 DIAGNOSIS — Z9181 History of falling: Secondary | ICD-10-CM | POA: Diagnosis not present

## 2018-11-25 DIAGNOSIS — E1140 Type 2 diabetes mellitus with diabetic neuropathy, unspecified: Secondary | ICD-10-CM | POA: Diagnosis not present

## 2018-12-01 DIAGNOSIS — N184 Chronic kidney disease, stage 4 (severe): Secondary | ICD-10-CM | POA: Diagnosis not present

## 2018-12-01 DIAGNOSIS — E1129 Type 2 diabetes mellitus with other diabetic kidney complication: Secondary | ICD-10-CM | POA: Diagnosis not present

## 2018-12-01 DIAGNOSIS — N281 Cyst of kidney, acquired: Secondary | ICD-10-CM | POA: Diagnosis not present

## 2018-12-01 DIAGNOSIS — I129 Hypertensive chronic kidney disease with stage 1 through stage 4 chronic kidney disease, or unspecified chronic kidney disease: Secondary | ICD-10-CM | POA: Diagnosis not present

## 2018-12-23 ENCOUNTER — Other Ambulatory Visit: Payer: Self-pay | Admitting: Nephrology

## 2018-12-23 ENCOUNTER — Ambulatory Visit
Admission: RE | Admit: 2018-12-23 | Discharge: 2018-12-23 | Disposition: A | Discharge status: Home / Self Care | Payer: Medicare HMO | Attending: Nephrology | Admitting: Nephrology

## 2018-12-23 ENCOUNTER — Ambulatory Visit
Admission: RE | Admit: 2018-12-23 | Discharge: 2018-12-23 | Disposition: A | Discharge status: Home / Self Care | Payer: Medicare HMO | Source: Ambulatory Visit | Attending: Nephrology | Admitting: Nephrology

## 2018-12-23 DIAGNOSIS — R0609 Other forms of dyspnea: Secondary | ICD-10-CM

## 2019-01-06 DIAGNOSIS — K2930 Chronic superficial gastritis without bleeding: Secondary | ICD-10-CM | POA: Insufficient documentation

## 2019-01-10 ENCOUNTER — Other Ambulatory Visit: Payer: Self-pay | Admitting: Nephrology

## 2019-01-10 ENCOUNTER — Encounter: Payer: Self-pay | Admitting: *Deleted

## 2019-01-10 DIAGNOSIS — N281 Cyst of kidney, acquired: Secondary | ICD-10-CM

## 2019-01-11 ENCOUNTER — Ambulatory Visit: Admission: RE | Admit: 2019-01-11 | Payer: Medicare HMO | Source: Home / Self Care | Admitting: Gastroenterology

## 2019-01-11 ENCOUNTER — Encounter: Admission: RE | Payer: Self-pay | Source: Home / Self Care

## 2019-01-11 SURGERY — COLONOSCOPY WITH PROPOFOL
Anesthesia: General

## 2019-01-14 ENCOUNTER — Ambulatory Visit
Admission: RE | Admit: 2019-01-14 | Discharge: 2019-01-14 | Disposition: A | Discharge status: Home / Self Care | Payer: Medicare HMO | Source: Ambulatory Visit | Attending: Nephrology | Admitting: Nephrology

## 2019-01-14 DIAGNOSIS — N281 Cyst of kidney, acquired: Secondary | ICD-10-CM

## 2019-02-04 ENCOUNTER — Ambulatory Visit: Payer: Medicare HMO | Admitting: Urology

## 2019-02-11 ENCOUNTER — Ambulatory Visit: Payer: Medicare HMO | Admitting: Urology

## 2019-02-25 ENCOUNTER — Encounter: Payer: Self-pay | Admitting: Urology

## 2019-02-25 ENCOUNTER — Ambulatory Visit: Payer: Medicare HMO | Admitting: Urology

## 2019-02-25 VITALS — BP 115/66 | HR 63 | Ht 65.0 in | Wt 147.0 lb

## 2019-02-25 DIAGNOSIS — N281 Cyst of kidney, acquired: Secondary | ICD-10-CM | POA: Diagnosis not present

## 2019-02-25 LAB — URINALYSIS, COMPLETE
Bilirubin, UA: NEGATIVE
Glucose, UA: NEGATIVE
Ketones, UA: NEGATIVE
Nitrite, UA: POSITIVE — AB
Specific Gravity, UA: 1.02 (ref 1.005–1.030)
UUROB: 0.2 mg/dL (ref 0.2–1.0)
pH, UA: 6.5 (ref 5.0–7.5)

## 2019-02-25 LAB — MICROSCOPIC EXAMINATION: RBC, UA: NONE SEEN /hpf (ref 0–2)

## 2019-02-25 NOTE — Progress Notes (Signed)
02/25/2019 9:57 AM   [name] [name] 1935-04-09 [id number]  Referring provider: Kirk Ruths, MD [facility] [place], [place] [address]  No chief complaint on file.   HPI:  Follow-up renal cyst-multiple cysts on her left kidney which was seen on noncontrast CT scan done for a 5 mm stone which she passed in 2018. The largest on the left was 6 cm. Ithas mild septations. Follow-up ultrasound in 2019 and CT in 2019 were stable. Recent January 2020 ultrasound shows a 1.8 cm RUP cyst, 2.8 cm left lower pole cyst and 7.2 cm cyst in the mid to upper pole with stable small septation.   She has not had contrasted studies due to her renal function. She has h/o severe CAD status post CABG in February 2018 as well as diabetic nephropathy. Cr was up to 3, but Oct 2019 down to 1.59.   She is voiding well. No gross hematuria.    PMH: Past Medical History:  Diagnosis Date  . [name]    a. LHC 02/03/17 for unstable angina showed severe 3v CAD w/ sequential 70% & 80% p-mLAD, 99% mLCx, CTO mRCA, elevated LV filling pressure  . Colon polyp 2010  . Diabetes mellitus with complication ([place])   . Glaucoma   . Hyperlipidemia   . Hypertension     Surgical History: Past Surgical History:  Procedure Laterality Date  . APPENDECTOMY    . BREAST BIOPSY Right 1999  . COLONOSCOPY  2010  . COLONOSCOPY WITH PROPOFOL N/A 10/07/2018   Procedure: COLONOSCOPY WITH PROPOFOL;  Surgeon: Lollie Sails, MD;  Location: [name] ENDOSCOPY;  Service: Endoscopy;  Laterality: N/A;  . CORONARY ANGIOPLASTY    . CORONARY ARTERY BYPASS GRAFT N/A 02/05/2017   Procedure: CORONARY ARTERY BYPASS GRAFTING (CABG) x 4                                                                               LIMA-LAD SEQ SVG-DIAG-OM SVG-PD;  Surgeon: Gaye Pollack, MD;  Location: [place] OR;  Service: Open Heart Surgery;  Laterality: N/A;  . ESOPHAGOGASTRODUODENOSCOPY (EGD) WITH PROPOFOL N/A  10/07/2018   Procedure: ESOPHAGOGASTRODUODENOSCOPY (EGD) WITH PROPOFOL;  Surgeon: Lollie Sails, MD;  Location: [facility] ENDOSCOPY;  Service: Endoscopy;  Laterality: N/A;  . IABP INSERTION N/A 02/04/2017   Procedure: IABP Insertion;  Surgeon: Leonie Man, MD;  Location: [place] CV LAB;  Service: Cardiovascular;  Laterality: N/A;  . LEFT HEART CATH AND CORONARY ANGIOGRAPHY Left 02/03/2017   Procedure: Left Heart Cath and Coronary Angiography;  Surgeon: Nelva Bush, MD;  Location: [place] [facility] LAB;  Service: Cardiovascular;  Laterality: Left;  . MITRAL VALVE REPAIR N/A 02/05/2017   Procedure: MITRAL VALVE REPLACEMENT (MVR) WITH MAGNA MITRAL EASE PERICARDIAL BIOPROSTHESIS 25 MM;  Surgeon: Gaye Pollack, MD;  Location: [place] OR;  Service: Open Heart Surgery;  Laterality: N/A;  . PARATHYROIDECTOMY     1986  . RIGHT HEART CATH N/A 02/04/2017   Procedure: Right Heart Cath;  Surgeon: Leonie Man, MD;  Location: [place] CV LAB;  Service: Cardiovascular;  Laterality: N/A;  . TEE WITHOUT CARDIOVERSION N/A 02/05/2017  Procedure: TRANSESOPHAGEAL ECHOCARDIOGRAM (TEE);  Surgeon: Gaye Pollack, MD;  Location: [place];  Service: Open Heart Surgery;  Laterality: N/A;    Home Medications:  Allergies as of 02/25/2019   No Known Allergies     Medication List       Accurate as of February 25, 2019  9:57 AM. Always use your most recent med list.        acetaminophen 500 MG tablet Commonly known as:  TYLENOL Take 500 mg by mouth every 6 (six) hours as needed for mild pain.   amLODipine 5 MG tablet Commonly known as:  NORVASC Take 5 mg by mouth daily.   aspirin EC 81 MG tablet Take 1 tablet (81 mg total) by mouth daily.   carvedilol 3.125 MG tablet Commonly known as:  COREG TAKE 1 TABLET BY MOUTH TWICE DAILY   cholecalciferol 10 MCG (400 UNIT) Tabs tablet Commonly known as:  VITAMIN D3 Take 1,000 Units by mouth.   diclofenac sodium 1 % Gel Commonly known as:  VOLTAREN Apply  topically 4 (four) times daily.   ferrous sulfate 325 (65 FE) MG tablet Take 325 mg by mouth daily with breakfast.   furosemide 40 MG tablet Commonly known as:  LASIX Take 40 mg Daily, alternating with 80 mg Daily.   isosorbide mononitrate 30 MG 24 hr tablet Commonly known as:  IMDUR TAKE 1 TABLET BY MOUTH ONCE DAILY   latanoprost 0.005 % ophthalmic solution Commonly known as:  XALATAN Place 1 drop into both eyes at bedtime.   omeprazole 20 MG capsule Commonly known as:  PRILOSEC Take 20 mg by mouth daily.   Potassium Chloride ER 20 MEQ Tbcr TAKE 1 TABLET BY MOUTH ONCE DAILY   rosuvastatin 40 MG tablet Commonly known as:  CRESTOR Take 40 mg by mouth daily.   timolol 0.5 % ophthalmic solution Commonly known as:  BETIMOL Place 1 drop into both eyes 2 (two) times daily.   torsemide 20 MG tablet Commonly known as:  DEMADEX Take 20 mg by mouth daily.   traMADol 50 MG tablet Commonly known as:  ULTRAM Take 1 tablet (50 mg total) by mouth every 8 (eight) hours as needed.       Allergies: No Known Allergies  Family History: Family History  Problem Relation Age of Onset  . Diabetes Father   . Diabetes Sister   . Bladder Cancer Neg Hx   . Kidney cancer Neg Hx   . Prostate cancer Neg Hx     Social History:  reports that she has never smoked. She has never used smokeless tobacco. She reports that she does not drink alcohol or use drugs.  ROS:                                        Physical Exam: There were no vitals taken for this visit.  Constitutional:  Alert and oriented, No acute distress. HEENT: [place] AT, moist mucus membranes.  Trachea midline, no masses. Cardiovascular: No clubbing, cyanosis, or edema. Respiratory: Normal respiratory effort, no increased work of breathing. GI: Abdomen is soft, nontender, nondistended, no abdominal masses GU: No CVA tenderness Lymph: No cervical or inguinal lymphadenopathy. Skin: No rashes, bruises  or suspicious lesions. Neurologic: Grossly intact, no focal deficits, moving all 4 extremities. Psychiatric: Normal mood and affect.  Laboratory Data: Lab Results  Component Value Date   WBC 6.8 10/19/2018  HGB 14.7 10/19/2018   HCT 45.0 10/19/2018   MCV 91.3 10/19/2018   PLT 196 10/19/2018    Lab Results  Component Value Date   CREATININE 1.57 (H) 10/19/2018    No results found for: PSA  No results found for: TESTOSTERONE  Lab Results  Component Value Date   HGBA1C 6.3 (H) 02/03/2017    Urinalysis    Component Value Date/Time   COLORURINE COLORLESS (A) 09/05/2018 1040   APPEARANCEUR CLEAR (A) 09/05/2018 1040   APPEARANCEUR Cloudy (A) 12/04/2017 1116   LABSPEC 1.004 (L) 09/05/2018 1040   PHURINE 5.0 09/05/2018 1040   GLUCOSEU NEGATIVE 09/05/2018 1040   HGBUR NEGATIVE 09/05/2018 1040   BILIRUBINUR NEGATIVE 09/05/2018 1040   BILIRUBINUR Negative 12/04/2017 1116   KETONESUR NEGATIVE 09/05/2018 1040   PROTEINUR NEGATIVE 09/05/2018 1040   NITRITE NEGATIVE 09/05/2018 1040   LEUKOCYTESUR NEGATIVE 09/05/2018 1040   LEUKOCYTESUR 1+ (A) 12/04/2017 1116    Lab Results  Component Value Date   LABMICR See below: 12/04/2017   WBCUA 11-30 (A) 12/04/2017   RBCUA None seen 12/04/2017   LABEPIT 0-10 12/04/2017   MUCUS Present (A) 12/04/2017   BACTERIA NONE SEEN 09/05/2018    Pertinent Imaging: CT in 2018 and 2019; [place] in 2019 and 2020 - I reviewed the images    Results for orders placed during the hospital encounter of 12/23/17  Abdomen 1 view (KUB)   Narrative CLINICAL DATA:  Kidney stones.  EXAM: ABDOMEN - 1 VIEW  COMPARISON:  12/03/2017 CT abdomen pelvis.  FINDINGS: There may be a punctate stone or stones in the upper pole right kidney. Renal vascular calcifications on the left. No definite calcifications along the expected course of the ureters or bladder. Specifically, a distal left ureteral stone seen on 12/03/2017 is not readily appreciated. Stool is  seen in the majority of the colon.  IMPRESSION: 1. Punctate right renal stones. 2. Distal left ureteral stone seen on 12/03/2017 is not readily appreciated.   Electronically Signed   By: Lorin Picket M.D.   On: 12/23/2017 12:02    No results found for this or any previous visit. No results found for this or any previous visit. No results found for this or any previous visit. Results for orders placed during the hospital encounter of 01/14/19  US RENAL   Narrative CLINICAL DATA:  Renal cysts.  EXAM: RENAL / URINARY TRACT ULTRASOUND COMPLETE  COMPARISON:  CT scan July 21, 2018  FINDINGS: Right Kidney:  Renal measurements: 11.7 x 4.8 x 5.3 cm = volume: 156 mL. Contains a 1.9 cm cyst in the upper pole.  Left Kidney:  Renal measurements: 12.5 x 5.2 x 5.1 cm = volume: 174 mL. Contains a 2.8 cm cyst in the lower pole and a 7.2 x 5.0 x 7.0 cm cyst in the mid to upper pole.  Bladder:  Appears normal for degree of bladder distention.  IMPRESSION: 1. Bilateral renal cysts. The largest is seen in the mid upper pole left kidney measuring up to 7.2 cm.   Electronically Signed   By: Dorise Bullion III M.D   On: 01/14/2019 15:31    No results found for this or any previous visit. No results found for this or any previous visit. No results found for this or any previous visit.  Assessment & Plan:    There are no diagnoses linked to this encounter.  No follow-ups on file.  Festus Aloe, MD  [facility] [address], [place]  [place], [place] [address] [phone]

## 2019-02-25 NOTE — Patient Instructions (Signed)
Chronic Kidney Disease, Adult  Chronic kidney disease (CKD) occurs when the kidneys become damaged slowly over a long period of time. The kidneys are a pair of organs that do many important jobs in the body, including:  · Removing waste and extra fluid from the blood to make urine.  · Making hormones that maintain the amount of fluid in tissues and blood vessels.  · Maintaining the right amount of fluids and chemicals in the body.  A small amount of kidney damage may not cause problems, but a large amount of damage may make it hard or impossible for the kidneys to work the way they should. If steps are not taken to slow down kidney damage or to stop it from getting worse, the kidneys may stop working permanently (end-stage renal disease or ESRD). Most of the time, CKD does not go away, but it can often be controlled. People who have CKD are usually able to live normal lives.  What are the causes?  The most common causes of this condition are diabetes and high blood pressure (hypertension). Other causes include:  · Heart and blood vessel (cardiovascular) disease.  · Kidney diseases, such as:  ? Glomerulonephritis.  ? Interstitial nephritis.  ? Polycystic kidney disease.  ? Renal vascular disease.  · Diseases that affect the immune system.  · Genetic diseases.  · Medicines that damage the kidneys, such as anti-inflammatory medicines.  · Being around or being in contact with poisonous (toxic) substances.  · A kidney or urinary infection that occurs again and again (recurs).  · Vasculitis. This is swelling or inflammation of the blood vessels.  · A problem with urine flow that may be caused by:  ? Cancer.  ? Having kidney stones more than one time.  ? An enlarged prostate, in males.  What increases the risk?  You are more likely to develop this condition if you:  · Are older than age 60.  · Are female.  · Are African-American, Hispanic, Asian, Pacific Islander, or American Indian.  · Are a current or former  smoker.  · Are obese.  · Have a family history of kidney disease or failure.  · Often take medicines that are damaging to the kidneys.  What are the signs or symptoms?  Symptoms of this condition include:  · Swelling (edema) of the face, legs, ankles, or feet.  · Tiredness (lethargy) and having less energy.  · Nausea or vomiting.  · Confusion or trouble concentrating.  · Problems with urination, such as:  ? Painful or burning feeling during urination.  ? Decreased urine production.  ? Frequent urination, especially at night.  ? Bloody urine.  · Muscle twitches and cramps, especially in the legs.  · Shortness of breath.  · Weakness.  · Loss of appetite.  · Metallic taste in the mouth.  · Trouble sleeping.  · Dry, itchy skin.  · A low blood count (anemia).  · Pale lining of the eyelids and surface of the eye (conjunctiva).  Symptoms develop slowly and may not be obvious until the kidney damage becomes severe. It is possible to have kidney disease for years without having any symptoms.  How is this diagnosed?  This condition may be diagnosed based on:  · Blood tests.  · Urine tests.  · Imaging tests, such as an ultrasound or CT scan.  · A test in which a sample of tissue is removed from the kidneys to be examined under a microscope (kidney biopsy).    These test results will help your health care provider determine how serious the CKD is.  How is this treated?  There is no cure for most cases of this condition, but treatment usually relieves symptoms and prevents or slows the progression of the disease. Treatment may include:  · Making diet changes, which may require you to avoid alcohol, salty foods (sodium), and foods that are high in potassium, calcium, and protein.  · Medicines:  ? To lower blood pressure.  ? To control blood glucose.  ? To relieve anemia.  ? To relieve swelling.  ? To protect your bones.  ? To improve the balance of electrolytes in your blood.  · Removing toxic waste from the body through types of  dialysis, if the kidneys can no longer do their job (kidney failure).  · Managing any other conditions that are causing your CKD or making it worse.  Follow these instructions at home:  Medicines  · Take over-the-counter and prescription medicines only as told by your health care provider. The dose of some medicines that you take may need to be adjusted.  · Do not take any new medicines unless approved by your health care provider. Many medicines can worsen your kidney damage.  · Do not take any vitamin and mineral supplements unless approved by your health care provider. Many nutritional supplements can worsen your kidney damage.  General instructions  · Follow your prescribed diet as told by your health care provider.  · Do not use any products that contain nicotine or tobacco, such as cigarettes and e-cigarettes. If you need help quitting, ask your health care provider.  · Monitor and track your blood pressure at home. Report changes in your blood pressure as told by your health care provider.  · If you are being treated for diabetes, monitor and track your blood sugar (blood glucose) levels as told by your health care provider.  · Maintain a healthy weight. If you need help with this, ask your health care provider.  · Start or continue an exercise plan. Exercise at least 30 minutes a day, 5 days a week.  · Keep your immunizations up to date as told by your health care provider.  · Keep all follow-up visits as told by your health care provider. This is important.  Where to find more information  · American Association of Kidney Patients: www.aakp.org  · National Kidney Foundation: www.kidney.org  · American Kidney Fund: www.akfinc.org  · [employer] [facility]: www.lifeoptions.org and www.kidneyschool.org  Contact a health care provider if:  · Your symptoms get worse.  · You develop new symptoms.  Get help right away if:  · You develop symptoms of ESRD, which include:  ? Headaches.  ? Numbness in the  hands or feet.  ? Easy bruising.  ? Frequent hiccups.  ? Chest pain.  ? Shortness of breath.  ? Lack of menstruation, in women.  · You have a fever.  · You have decreased urine production.  · You have pain or bleeding when you urinate.  Summary  · Chronic kidney disease (CKD) occurs when the kidneys become damaged slowly over a long period of time.  · The most common causes of this condition are diabetes and high blood pressure (hypertension).  · There is no cure for most cases of this condition, but treatment usually relieves symptoms and prevents or slows the progression of the disease. Treatment may include a combination of medicines and lifestyle changes.  This information is   not intended to replace advice given to you by your health care provider. Make sure you discuss any questions you have with your health care provider.  Document Released: 09/16/2008 Document Revised: 01/15/2017 Document Reviewed: 01/15/2017  Elsevier Interactive Patient Education © 2019 Elsevier Inc.

## 2019-06-14 ENCOUNTER — Other Ambulatory Visit: Payer: Self-pay | Admitting: Internal Medicine

## 2019-06-14 DIAGNOSIS — R29898 Other symptoms and signs involving the musculoskeletal system: Secondary | ICD-10-CM

## 2019-06-15 ENCOUNTER — Other Ambulatory Visit: Payer: Self-pay

## 2019-06-15 ENCOUNTER — Ambulatory Visit
Admission: RE | Admit: 2019-06-15 | Discharge: 2019-06-15 | Disposition: A | Discharge status: Home / Self Care | Payer: Medicare HMO | Source: Ambulatory Visit | Attending: Internal Medicine | Admitting: Internal Medicine

## 2019-06-15 DIAGNOSIS — R29898 Other symptoms and signs involving the musculoskeletal system: Secondary | ICD-10-CM | POA: Diagnosis not present

## 2019-06-28 ENCOUNTER — Encounter (INDEPENDENT_AMBULATORY_CARE_PROVIDER_SITE_OTHER): Payer: Self-pay | Admitting: Vascular Surgery

## 2019-06-28 ENCOUNTER — Other Ambulatory Visit: Payer: Self-pay

## 2019-06-28 ENCOUNTER — Ambulatory Visit (INDEPENDENT_AMBULATORY_CARE_PROVIDER_SITE_OTHER): Payer: Medicare HMO | Admitting: Vascular Surgery

## 2019-06-28 VITALS — BP 120/71 | HR 62 | Resp 16 | Ht 63.0 in | Wt 149.6 lb

## 2019-06-28 DIAGNOSIS — M79605 Pain in left leg: Secondary | ICD-10-CM

## 2019-06-28 DIAGNOSIS — I129 Hypertensive chronic kidney disease with stage 1 through stage 4 chronic kidney disease, or unspecified chronic kidney disease: Secondary | ICD-10-CM

## 2019-06-28 DIAGNOSIS — E785 Hyperlipidemia, unspecified: Secondary | ICD-10-CM

## 2019-06-28 DIAGNOSIS — E118 Type 2 diabetes mellitus with unspecified complications: Secondary | ICD-10-CM

## 2019-06-28 DIAGNOSIS — E1122 Type 2 diabetes mellitus with diabetic chronic kidney disease: Secondary | ICD-10-CM

## 2019-06-28 DIAGNOSIS — I10 Essential (primary) hypertension: Secondary | ICD-10-CM

## 2019-06-28 DIAGNOSIS — Z79899 Other long term (current) drug therapy: Secondary | ICD-10-CM

## 2019-06-28 DIAGNOSIS — N184 Chronic kidney disease, stage 4 (severe): Secondary | ICD-10-CM

## 2019-06-28 DIAGNOSIS — M79604 Pain in right leg: Secondary | ICD-10-CM

## 2019-06-28 DIAGNOSIS — M79609 Pain in unspecified limb: Secondary | ICD-10-CM | POA: Insufficient documentation

## 2019-06-28 NOTE — Assessment & Plan Note (Signed)
blood pressure control important in reducing the progression of atherosclerotic disease. On appropriate oral medications.  

## 2019-06-28 NOTE — Patient Instructions (Signed)
Peripheral Vascular Disease  Peripheral vascular disease (PVD) is a disease of the blood vessels that are not part of your heart and brain. A simple term for PVD is poor circulation. In most cases, PVD narrows the blood vessels that carry blood from your heart to the rest of your body. This can reduce the supply of blood to your arms, legs, and internal organs, like your stomach or kidneys. However, PVD most often affects a person's lower legs and feet. Without treatment, PVD tends to get worse. PVD can also lead to acute ischemic limb. This is when an arm or leg suddenly cannot get enough blood. This is a medical emergency. Follow these instructions at home: Lifestyle  Do not use any products that contain nicotine or tobacco, such as cigarettes and e-cigarettes. If you need help quitting, ask your doctor.  Lose weight if you are overweight. Or, stay at a healthy weight as told by your doctor.  Eat a diet that is low in fat and cholesterol. If you need help, ask your doctor.  Exercise regularly. Ask your doctor for activities that are right for you. General instructions  Take over-the-counter and prescription medicines only as told by your doctor.  Take good care of your feet: ? Wear comfortable shoes that fit well. ? Check your feet often for any cuts or sores.  Keep all follow-up visits as told by your doctor This is important. Contact a doctor if:  You have cramps in your legs when you walk.  You have leg pain when you are at rest.  You have coldness in a leg or foot.  Your skin changes.  You are unable to get or have an erection (erectile dysfunction).  You have cuts or sores on your feet that do not heal. Get help right away if:  Your arm or leg turns cold, numb, and blue.  Your arms or legs become red, warm, swollen, painful, or numb.  You have chest pain.  You have trouble breathing.  You suddenly have weakness in your face, arm, or leg.  You become very  confused or you cannot speak.  You suddenly have a very bad headache.  You suddenly cannot see. Summary  Peripheral vascular disease (PVD) is a disease of the blood vessels.  A simple term for PVD is poor circulation. Without treatment, PVD tends to get worse.  Treatment may include exercise, low fat and low cholesterol diet, and quitting smoking. This information is not intended to replace advice given to you by your health care provider. Make sure you discuss any questions you have with your health care provider. Document Released: 03/04/2010 Document Revised: 11/20/2017 Document Reviewed: 01/15/2017 Elsevier Patient Education  2020 Elsevier Inc.  

## 2019-06-28 NOTE — Progress Notes (Signed)
Patient ID: [name] [name], female   DOB: [birth year], 84 y.o.   MRN: [id number]  Chief Complaint  Patient presents with   New Patient (Initial Visit)    ref [facility] for claudication    HPI [name] [name] is a 84 y.o. female.  I am asked to see the patient by Dr. Lacinda Axon for evaluation of claudication symptoms.  She has some known issues with her back, and her neurosurgeon is trying to determine whether or not arterial insufficiency may be playing a role in her lower extremity symptoms.  Her biggest complaint from her back is when she is laying flat getting pain going down her legs.  She denies any ulceration or infection.  No fevers or chills.  Several years ago, she had ABIs done which were normal but has not had this checked since.  Both legs do tire easily and occasionally give out on her when she is walking.  She is now walking with a cane.  Given her age and other medical risk factors, PAD is certainly in the differential diagnosis and she is referred to have this evaluated.     Past Medical History:  Diagnosis Date   [name]    a. LHC 02/03/17 for unstable angina showed severe 3v CAD w/ sequential 70% & 80% p-mLAD, 99% mLCx, CTO mRCA, elevated LV filling pressure   Colon polyp 2010   Diabetes mellitus with complication ([place])    Glaucoma    Hyperlipidemia    Hypertension     Past Surgical History:  Procedure Laterality Date   APPENDECTOMY     BREAST BIOPSY Right 1999   COLONOSCOPY  2010   COLONOSCOPY WITH PROPOFOL N/A 10/07/2018   Procedure: COLONOSCOPY WITH PROPOFOL;  Surgeon: Lollie Sails, MD;  Location: [facility] ENDOSCOPY;  Service: Endoscopy;  Laterality: N/A;   CORONARY ANGIOPLASTY     CORONARY ARTERY BYPASS GRAFT N/A 02/05/2017   Procedure: CORONARY ARTERY BYPASS GRAFTING (CABG) x 4                                                                               LIMA-LAD SEQ SVG-DIAG-OM SVG-PD;  Surgeon: Gaye Pollack, MD;  Location: [place] OR;   Service: Open Heart Surgery;  Laterality: N/A;   ESOPHAGOGASTRODUODENOSCOPY (EGD) WITH PROPOFOL N/A 10/07/2018   Procedure: ESOPHAGOGASTRODUODENOSCOPY (EGD) WITH PROPOFOL;  Surgeon: Lollie Sails, MD;  Location: [facility] ENDOSCOPY;  Service: Endoscopy;  Laterality: N/A;   IABP INSERTION N/A 02/04/2017   Procedure: IABP Insertion;  Surgeon: Leonie Man, MD;  Location: [place] CV LAB;  Service: Cardiovascular;  Laterality: N/A;   LEFT HEART CATH AND CORONARY ANGIOGRAPHY Left 02/03/2017   Procedure: Left Heart Cath and Coronary Angiography;  Surgeon: Nelva Bush, MD;  Location: [place] CV LAB;  Service: Cardiovascular;  Laterality: Left;   MITRAL VALVE REPAIR N/A 02/05/2017   Procedure: MITRAL VALVE REPLACEMENT (MVR) WITH MAGNA MITRAL EASE PERICARDIAL BIOPROSTHESIS 25 MM;  Surgeon: Gaye Pollack, MD;  Location: [place] OR;  Service: Open Heart Surgery;  Laterality: N/A;   PARATHYROIDECTOMY     1986   RIGHT HEART CATH N/A 02/04/2017   Procedure: Right Heart Cath;  Surgeon: Leonie Man, MD;  Location: [place] CV LAB;  Service: Cardiovascular;  Laterality: N/A;   TEE WITHOUT CARDIOVERSION N/A 02/05/2017   Procedure: TRANSESOPHAGEAL ECHOCARDIOGRAM (TEE);  Surgeon: Gaye Pollack, MD;  Location: [place];  Service: Open Heart Surgery;  Laterality: N/A;    Family History Family History  Problem Relation Age of Onset   Diabetes Father    Diabetes Sister    Bladder Cancer Neg Hx    Kidney cancer Neg Hx    Prostate cancer Neg Hx   no bleeding or clotting disorders  Social History Social History   Tobacco Use   Smoking status: Never Smoker   Smokeless tobacco: Never Used  Substance Use Topics   Alcohol use: No   Drug use: Never     No Known Allergies  Current Outpatient Medications  Medication Sig Dispense Refill   acetaminophen (TYLENOL) 500 MG tablet Take 500 mg by mouth every 6 (six) hours as needed for mild pain.      amLODipine (NORVASC) 5 MG  tablet Take 5 mg by mouth daily.     aspirin EC 81 MG tablet Take 1 tablet (81 mg total) by mouth daily. 90 tablet 3   carvedilol (COREG) 3.125 MG tablet TAKE 1 TABLET BY MOUTH TWICE DAILY 180 tablet 3   cholecalciferol (VITAMIN D3) 10 MCG (400 UNIT) TABS tablet Take 1,000 Units by mouth.     diclofenac sodium (VOLTAREN) 1 % GEL Apply topically 4 (four) times daily.     ferrous sulfate 325 (65 FE) MG tablet Take 325 mg by mouth daily with breakfast.     furosemide (LASIX) 40 MG tablet Take 40 mg Daily, alternating with 80 mg Daily. 45 tablet 3   isosorbide mononitrate (IMDUR) 30 MG 24 hr tablet TAKE 1 TABLET BY MOUTH ONCE DAILY 90 tablet 3   latanoprost (XALATAN) 0.005 % ophthalmic solution Place 1 drop into both eyes at bedtime.      Potassium Chloride ER 20 MEQ TBCR TAKE 1 TABLET BY MOUTH ONCE DAILY 90 tablet 1   timolol (BETIMOL) 0.5 % ophthalmic solution Place 1 drop into both eyes 2 (two) times daily.     omeprazole (PRILOSEC) 20 MG capsule Take 20 mg by mouth daily.     rosuvastatin (CRESTOR) 40 MG tablet Take 40 mg by mouth daily.     torsemide (DEMADEX) 20 MG tablet Take 20 mg by mouth daily.     traMADol (ULTRAM) 50 MG tablet Take 1 tablet (50 mg total) by mouth every 8 (eight) hours as needed. (Patient not taking: Reported on 06/28/2019) 15 tablet 0   No current facility-administered medications for this visit.       REVIEW OF SYSTEMS (Negative unless checked)  Constitutional: [] Weight loss  [] Fever  [] Chills Cardiac: [] Chest pain   [] Chest pressure   [] Palpitations   [] Shortness of breath when laying flat   [] Shortness of breath at rest   [] Shortness of breath with exertion. Vascular:  [x] Pain in legs with walking   [] Pain in legs at rest   [] Pain in legs when laying flat   [x] Claudication   [] Pain in feet when walking  [] Pain in feet at rest  [] Pain in feet when laying flat   [] History of DVT   [] Phlebitis   [] Swelling in legs   [] Varicose veins   [] Non-healing  ulcers Pulmonary:   [] Uses home oxygen   [] Productive cough   [] Hemoptysis   [] Wheeze  [] COPD   [] Asthma Neurologic:  []   Dizziness  [] Blackouts   [] Seizures   [] History of stroke   [] History of TIA  [] Aphasia   [] Temporary blindness   [] Dysphagia   [] Weakness or numbness in arms   [x] Weakness or numbness in legs Musculoskeletal:  [x] Arthritis   [] Joint swelling   [] Joint pain   [x] Low back pain Hematologic:  [] Easy bruising  [] Easy bleeding   [] Hypercoagulable state   [] Anemic  [] Hepatitis Gastrointestinal:  [] Blood in stool   [] Vomiting blood  [] Gastroesophageal reflux/heartburn   [] Abdominal pain Genitourinary:  [] Chronic kidney disease   [] Difficult urination  [] Frequent urination  [] Burning with urination   [] Hematuria Skin:  [] Rashes   [] Ulcers   [] Wounds Psychological:  [] History of anxiety   []  History of major depression.    Physical Exam BP 120/71 (BP Location: Right Arm)    Pulse 62    Resp 16    Ht 5\' 3"  (1.6 m)    Wt 149 lb 9.6 oz (67.9 kg)    BMI 26.50 kg/m  Gen:  WD/WN, NAD. Appears younger than stated age. Head: [place]/AT, No temporalis wasting. Ear/Nose/Throat: Hearing grossly intact, nares w/o erythema or drainage, oropharynx w/o Erythema/Exudate Eyes: Conjunctiva clear, sclera non-icteric  Neck: trachea midline.  No JVD.  Pulmonary:  Good air movement, respirations not labored, no use of accessory muscles  Cardiac: RRR, no JVD Vascular:  Vessel Right Left  Radial Palpable Palpable                          DP 1+ 1+  PT 2+ 1+   Gastrointestinal:. No masses, surgical incisions, or scars. Musculoskeletal: M/S 5/5 throughout.  Extremities without ischemic changes.  No deformity or atrophy. No edema. Walks with a cane Neurologic: Sensation grossly intact in extremities.  Symmetrical.  Speech is fluent. Motor exam as listed above. Psychiatric: Judgment intact, Mood & affect appropriate for pt's clinical situation. Dermatologic: No rashes or ulcers noted.  No cellulitis or  open wounds.    Radiology Mr Lumbar Spine Wo Contrast  Result Date: 06/16/2019 CLINICAL DATA:  Weakness of both lower limbs. EXAM: MRI LUMBAR SPINE WITHOUT CONTRAST TECHNIQUE: Multiplanar, multisequence MR imaging of the lumbar spine was performed. No intravenous contrast was administered. COMPARISON:  None. FINDINGS: Segmentation:  5 lumbar type vertebrae Alignment:  Degenerative grade 1 anterolisthesis at L4-5 Vertebrae: Mild discogenic edema at L3-4 and L4-5. No fracture, discitis, or aggressive bone lesion. Conus medullaris and cauda equina: Conus extends to the L1 level. Conus and cauda equina appear normal when accounting for root distortion by spinal stenosis. Paraspinal and other soft tissues: Multi cystic left kidney Disc levels: T12- L1: Small left paracentral protrusion without neural contact L1-L2: Unremarkable. L2-L3: Unremarkable. L3-L4: Disc narrowing and bulging. Posterior element hypertrophy. High-grade spinal stenosis. There is facet spurring more notable on the right there is moderate foraminal encroachment. L4-L5: Greatest level of degenerative disc narrowing with circumferential bulging. Degenerative posterior element hypertrophy. Moderate to advanced spinal stenosis. Left foraminal impingement with bulging disc mildly deforming the L4 nerve root L5-S1:Disc narrowing and bulging with mild bilateral foraminal stenosis. Mild facet spurring. IMPRESSION: 1. High-grade degenerative spinal stenosis at L4-5 and especially at L3-4. 2. Foraminal narrowing that is moderate on the right at L3-4 and moderate to advanced on the left at L4-5. Electronically Signed   By: Monte Fantasia M.D.   On: 06/16/2019 05:19    Labs No results found for this or any previous visit (from the past 2160 hour(s)).  Assessment/Plan:  Essential hypertension blood pressure control important in reducing the progression of atherosclerotic disease. On appropriate oral medications.   CKD (chronic kidney disease),  stage IV (HCC) We will do assessments with noninvasive studies and avoid contrast unless absolutely necessary.  Type 2 diabetes mellitus with complication (HCC) blood pressure control important in reducing the progression of atherosclerotic disease. On appropriate oral medications.   HLD (hyperlipidemia) lipid control important in reducing the progression of atherosclerotic disease. Continue statin therapy   Pain in limb The patient has lower extremity pain that is not entirely clear in its etiology.  Certainly, her back issues may be the culprit but with multiple atherosclerotic risk factors and previous CAD, PAD needs to be considered and assessed for.  Noninvasive studies will be performed in the near future at her convenience.  We will see her back following the studies to discuss the results and determine further treatment options.      Leotis Pain 06/28/2019, 11:11 AM   This note was created with Dragon medical transcription system.  Any errors from dictation are unintentional.

## 2019-06-28 NOTE — Assessment & Plan Note (Signed)
lipid control important in reducing the progression of atherosclerotic disease. Continue statin therapy  

## 2019-06-28 NOTE — Assessment & Plan Note (Signed)
The patient has lower extremity pain that is not entirely clear in its etiology.  Certainly, her back issues may be the culprit but with multiple atherosclerotic risk factors and previous CAD, PAD needs to be considered and assessed for.  Noninvasive studies will be performed in the near future at her convenience.  We will see her back following the studies to discuss the results and determine further treatment options.

## 2019-06-28 NOTE — Assessment & Plan Note (Signed)
We will do assessments with noninvasive studies and avoid contrast unless absolutely necessary.

## 2019-07-05 ENCOUNTER — Other Ambulatory Visit (INDEPENDENT_AMBULATORY_CARE_PROVIDER_SITE_OTHER): Payer: Self-pay | Admitting: Vascular Surgery

## 2019-07-05 DIAGNOSIS — M79605 Pain in left leg: Secondary | ICD-10-CM

## 2019-07-05 DIAGNOSIS — M79604 Pain in right leg: Secondary | ICD-10-CM

## 2019-07-06 ENCOUNTER — Ambulatory Visit (INDEPENDENT_AMBULATORY_CARE_PROVIDER_SITE_OTHER): Payer: Medicare HMO

## 2019-07-06 ENCOUNTER — Other Ambulatory Visit: Payer: Self-pay

## 2019-07-06 ENCOUNTER — Encounter (INDEPENDENT_AMBULATORY_CARE_PROVIDER_SITE_OTHER): Payer: Self-pay | Admitting: Nurse Practitioner

## 2019-07-06 ENCOUNTER — Ambulatory Visit (INDEPENDENT_AMBULATORY_CARE_PROVIDER_SITE_OTHER): Payer: Medicare HMO | Admitting: Nurse Practitioner

## 2019-07-06 VITALS — BP 125/68 | HR 62 | Resp 16 | Ht 63.0 in | Wt 154.0 lb

## 2019-07-06 DIAGNOSIS — I10 Essential (primary) hypertension: Secondary | ICD-10-CM

## 2019-07-06 DIAGNOSIS — M79604 Pain in right leg: Secondary | ICD-10-CM

## 2019-07-06 DIAGNOSIS — M79605 Pain in left leg: Secondary | ICD-10-CM

## 2019-07-06 DIAGNOSIS — E785 Hyperlipidemia, unspecified: Secondary | ICD-10-CM | POA: Diagnosis not present

## 2019-07-06 DIAGNOSIS — Z79899 Other long term (current) drug therapy: Secondary | ICD-10-CM

## 2019-07-10 ENCOUNTER — Encounter (INDEPENDENT_AMBULATORY_CARE_PROVIDER_SITE_OTHER): Payer: Self-pay | Admitting: Nurse Practitioner

## 2019-07-10 NOTE — Progress Notes (Signed)
SUBJECTIVE:  Patient ID: [name] [name], female    DOB: 1935-11-05, 84 y.o.   MRN: [id number] Chief Complaint  Patient presents with  . Follow-up    ultrasound    HPI  [name] [name] is a 84 y.o. female that presents today for lower extremity pain follow up .  The patient's pain is atypical for PAD.  She denies pain when walking.  She states that her balance has been thrown off at times.  She endorses pain when she bends forward or changes position in other ways.  She notes that the pain is at its worse when she lays flat but is better when she stands up.  The pain doesn't last all night.  This happens in both legs.   She has no previous history of intervention.  She denies any tia like symptoms or amaurosis fugax.  She has never smoked but has several atherosclerotic risk factors.    Today her ABIs were non compressible bilaterally.  Her TBI on the right was 0.67 and on the left was 0.72.  Her tibial arteries have weak triphasic waveforms with strong toe waveforms.    Past Medical History:  Diagnosis Date  . [name]    a. LHC 02/03/17 for unstable angina showed severe 3v CAD w/ sequential 70% & 80% p-mLAD, 99% mLCx, CTO mRCA, elevated LV filling pressure  . Colon polyp 2010  . Diabetes mellitus with complication ([place])   . Glaucoma   . Hyperlipidemia   . Hypertension     Past Surgical History:  Procedure Laterality Date  . APPENDECTOMY    . BREAST BIOPSY Right 1999  . COLONOSCOPY  2010  . COLONOSCOPY WITH PROPOFOL N/A 10/07/2018   Procedure: COLONOSCOPY WITH PROPOFOL;  Surgeon: Lollie Sails, MD;  Location: [facility] ENDOSCOPY;  Service: Endoscopy;  Laterality: N/A;  . CORONARY ANGIOPLASTY    . CORONARY ARTERY BYPASS GRAFT N/A 02/05/2017   Procedure: CORONARY ARTERY BYPASS GRAFTING (CABG) x 4                                                                               LIMA-LAD SEQ SVG-DIAG-OM SVG-PD;  Surgeon: Gaye Pollack, MD;  Location: [place] OR;  Service:  Open Heart Surgery;  Laterality: N/A;  . ESOPHAGOGASTRODUODENOSCOPY (EGD) WITH PROPOFOL N/A 10/07/2018   Procedure: ESOPHAGOGASTRODUODENOSCOPY (EGD) WITH PROPOFOL;  Surgeon: Lollie Sails, MD;  Location: [facility] ENDOSCOPY;  Service: Endoscopy;  Laterality: N/A;  . IABP INSERTION N/A 02/04/2017   Procedure: IABP Insertion;  Surgeon: Leonie Man, MD;  Location: [place] CV LAB;  Service: Cardiovascular;  Laterality: N/A;  . LEFT HEART CATH AND CORONARY ANGIOGRAPHY Left 02/03/2017   Procedure: Left Heart Cath and Coronary Angiography;  Surgeon: Nelva Bush, MD;  Location: [place] CV LAB;  Service: Cardiovascular;  Laterality: Left;  . MITRAL VALVE REPAIR N/A 02/05/2017   Procedure: MITRAL VALVE REPLACEMENT (MVR) WITH MAGNA MITRAL EASE PERICARDIAL BIOPROSTHESIS 25 MM;  Surgeon: Gaye Pollack, MD;  Location: [place] OR;  Service: Open Heart Surgery;  Laterality: N/A;  . PARATHYROIDECTOMY     1986  . RIGHT HEART CATH N/A 02/04/2017   Procedure: Right Heart Cath;  Surgeon: Leonie Man, MD;  Location: [place] CV LAB;  Service: Cardiovascular;  Laterality: N/A;  . TEE WITHOUT CARDIOVERSION N/A 02/05/2017   Procedure: TRANSESOPHAGEAL ECHOCARDIOGRAM (TEE);  Surgeon: Gaye Pollack, MD;  Location: [place];  Service: Open Heart Surgery;  Laterality: N/A;    Social History   Socioeconomic History  . Marital status: Widowed    Spouse name: Not on file  . Number of children: Not on file  . Years of education: Not on file  . Highest education level: Not on file  Occupational History  . Not on file  Social Needs  . Financial resource strain: Not on file  . Food insecurity    Worry: Not on file    Inability: Not on file  . Transportation needs    Medical: Not on file    Non-medical: Not on file  Tobacco Use  . Smoking status: Never Smoker  . Smokeless tobacco: Never Used  Substance and Sexual Activity  . Alcohol use: No  . Drug use: Never  . Sexual activity: Not on file   Lifestyle  . Physical activity    Days per week: Not on file    Minutes per session: Not on file  . Stress: Not on file  Relationships  . Social Herbalist on phone: Not on file    Gets together: Not on file    Attends religious service: Not on file    Active member of club or organization: Not on file    Attends meetings of clubs or organizations: Not on file    Relationship status: Not on file  . Intimate partner violence    Fear of current or ex partner: Not on file    Emotionally abused: Not on file    Physically abused: Not on file    Forced sexual activity: Not on file  Other Topics Concern  . Not on file  Social History Narrative  . Not on file    Family History  Problem Relation Age of Onset  . Diabetes Father   . Diabetes Sister   . Bladder Cancer Neg Hx   . Kidney cancer Neg Hx   . Prostate cancer Neg Hx     No Known Allergies   Review of Systems   Review of Systems: Negative Unless Checked Constitutional: [] Weight loss  [] Fever  [] Chills Cardiac: [] Chest pain   []  Atrial Fibrillation  [] Palpitations   [] Shortness of breath when laying flat   [x] Shortness of breath with exertion. [] Shortness of breath at rest Vascular:  [] Pain in legs with walking   [x] Pain in legs with standing [x] Pain in legs when laying flat   [] Claudication    [x] Pain in feet when laying flat    [] History of DVT   [] Phlebitis   [] Swelling in legs   [x] Varicose veins   [] Non-healing ulcers Pulmonary:   [] Uses home oxygen   [] Productive cough   [] Hemoptysis   [] Wheeze  [] COPD   [] Asthma Neurologic:  [] Dizziness   [] Seizures  [] Blackouts [] History of stroke   [] History of TIA  [] Aphasia   [] Temporary Blindness   [] Weakness or numbness in arm   [x] Weakness or numbness in leg Musculoskeletal:   [] Joint swelling   [] Joint pain   [] Low back pain  []  History of Knee Replacement [] Arthritis [] back Surgeries  []  Spinal Stenosis    Hematologic:  [] Easy bruising  [] Easy bleeding    [] Hypercoagulable state   [x] Anemic Gastrointestinal:  [] Diarrhea   []   Vomiting  [] Gastroesophageal reflux/heartburn   [] Difficulty swallowing. [] Abdominal pain Genitourinary:  [x] Chronic kidney disease   [] Difficult urination  [] Anuric   [] Blood in urine [] Frequent urination  [] Burning with urination   [] Hematuria Skin:  [] Rashes   [] Ulcers [] Wounds Psychological:  [] History of anxiety   []  History of major depression  []  Memory Difficulties      OBJECTIVE:   Physical Exam  BP 125/68 (BP Location: Right Arm)   Pulse 62   Resp 16   Ht 5\' 3"  (1.6 m)   Wt 154 lb (69.9 kg)   BMI 27.28 kg/m   Gen: WD/WN, NAD Head: [place]/AT, No temporalis wasting.  Ear/Nose/Throat: Hearing grossly intact, nares w/o erythema or drainage Eyes: PER, EOMI, sclera nonicteric.  Neck: Supple, no masses.  No JVD.  Pulmonary:  Good air movement, no use of accessory muscles.  Cardiac: RRR Vascular:  Vessel Right Left  Radial Palpable Palpable  Dorsalis Pedis Palpable Palpable  Posterior Tibial Palpable Palpable   Gastrointestinal: soft, non-distended. No guarding/no peritoneal signs.  Musculoskeletal: M/S 5/5 throughout.  No deformity or atrophy. Uses cane for ambulation  Neurologic: Pain and light touch intact in extremities.  Symmetrical.  Speech is fluent. Motor exam as listed above. Psychiatric: Judgment intact, Mood & affect appropriate for pt's clinical situation. Dermatologic: No Venous rashes. No Ulcers Noted.  No changes consistent with cellulitis. Lymph : No Cervical lymphadenopathy, no lichenification or skin changes of chronic lymphedema.       ASSESSMENT AND PLAN:  1. Pain in both lower extremities I strongly suspect that her pain is due to spinal stenosis and that her symptoms are consistent with neurogenic claudication.  She is due to have an MRI within the next week.   I discussed several options with the patient but ultimately we came to the agreement that if her MRI is inconclusive and  does not show any issues within the spine that we should move forward with additional studies to conclusively rule out any peripheral artery disease.   - VAS US AORTA/IVC/ILIACS; Future - VAS [place] LOWER EXTREMITY ARTERIAL DUPLEX; Future  2. Essential hypertension Continue antihypertensive medications as already ordered, these medications have been reviewed and there are no changes at this time.   3. Hyperlipidemia, unspecified hyperlipidemia type Continue statin as ordered and reviewed, no changes at this time    Current Outpatient Medications on File Prior to Visit  Medication Sig Dispense Refill  . acetaminophen (TYLENOL) 500 MG tablet Take 500 mg by mouth every 6 (six) hours as needed for mild pain.     Marland Kitchen amLODipine (NORVASC) 5 MG tablet Take 5 mg by mouth daily.    Marland Kitchen aspirin EC 81 MG tablet Take 1 tablet (81 mg total) by mouth daily. 90 tablet 3  . carvedilol (COREG) 3.125 MG tablet TAKE 1 TABLET BY MOUTH TWICE DAILY 180 tablet 3  . cholecalciferol (VITAMIN D3) 10 MCG (400 UNIT) TABS tablet Take 1,000 Units by mouth.    . diclofenac sodium (VOLTAREN) 1 % GEL Apply topically 4 (four) times daily.    . ferrous sulfate 325 (65 FE) MG tablet Take 325 mg by mouth daily with breakfast.    . furosemide (LASIX) 40 MG tablet Take 40 mg Daily, alternating with 80 mg Daily. 45 tablet 3  . isosorbide mononitrate (IMDUR) 30 MG 24 hr tablet TAKE 1 TABLET BY MOUTH ONCE DAILY 90 tablet 3  . latanoprost (XALATAN) 0.005 % ophthalmic solution Place 1 drop into both eyes at bedtime.     Marland Kitchen  omeprazole (PRILOSEC) 20 MG capsule Take 20 mg by mouth daily.    . Potassium Chloride ER 20 MEQ TBCR TAKE 1 TABLET BY MOUTH ONCE DAILY 90 tablet 1  . rosuvastatin (CRESTOR) 40 MG tablet Take 40 mg by mouth daily.    . timolol (BETIMOL) 0.5 % ophthalmic solution Place 1 drop into both eyes 2 (two) times daily.    . traMADol (ULTRAM) 50 MG tablet Take 1 tablet (50 mg total) by mouth every 8 (eight) hours as needed. 15  tablet 0  . torsemide (DEMADEX) 20 MG tablet Take 20 mg by mouth daily.     No current facility-administered medications on file prior to visit.     There are no Patient Instructions on file for this visit. No follow-ups on file.   Kris Hartmann, NP  This note was completed with Sales executive.  Any errors are purely unintentional.

## 2019-07-27 ENCOUNTER — Ambulatory Visit (INDEPENDENT_AMBULATORY_CARE_PROVIDER_SITE_OTHER): Payer: Medicare HMO

## 2019-07-27 ENCOUNTER — Encounter (INDEPENDENT_AMBULATORY_CARE_PROVIDER_SITE_OTHER): Payer: Self-pay | Admitting: Nurse Practitioner

## 2019-07-27 ENCOUNTER — Ambulatory Visit (INDEPENDENT_AMBULATORY_CARE_PROVIDER_SITE_OTHER): Payer: Medicare HMO | Admitting: Nurse Practitioner

## 2019-07-27 ENCOUNTER — Other Ambulatory Visit: Payer: Self-pay

## 2019-07-27 VITALS — BP 146/74 | HR 54 | Resp 17 | Ht 63.0 in | Wt 154.0 lb

## 2019-07-27 DIAGNOSIS — M79604 Pain in right leg: Secondary | ICD-10-CM

## 2019-07-27 DIAGNOSIS — E118 Type 2 diabetes mellitus with unspecified complications: Secondary | ICD-10-CM | POA: Diagnosis not present

## 2019-07-27 DIAGNOSIS — E785 Hyperlipidemia, unspecified: Secondary | ICD-10-CM

## 2019-07-27 DIAGNOSIS — M79605 Pain in left leg: Secondary | ICD-10-CM

## 2019-07-27 DIAGNOSIS — I739 Peripheral vascular disease, unspecified: Secondary | ICD-10-CM

## 2019-08-03 ENCOUNTER — Encounter (INDEPENDENT_AMBULATORY_CARE_PROVIDER_SITE_OTHER): Payer: Self-pay | Admitting: Nurse Practitioner

## 2019-08-03 NOTE — Progress Notes (Signed)
SUBJECTIVE:  Patient ID: [name] [name], female    DOB: 04-05-1935, 84 y.o.   MRN: [id number] Chief Complaint  Patient presents with  . Follow-up    HPI  [name] [name] is a 84 y.o. female that follows up today for non invasive studies to determine possible causes to lower extremity weakness.  Previous studies done revealed the patient has a high grade spinal stenosis and she is currently being seen by neurosurgery.  She has multiple risk factors for PAD and previous ABIs were non compressible.  Today the patient denies claudication like symptoms, rest pian, or ulceration.  She denies any fever, chills, nausea or vomiting.    An abdominal aorta illiac duplex revealed mixed triphasic/biphasic waveforms within the illiac arteries.  The patient's distal anterior tibial arteries have monophasic waveforms with biphasic waveforms within the rest of the lower extremity.    Past Medical History:  Diagnosis Date  . [name]    a. LHC 02/03/17 for unstable angina showed severe 3v CAD w/ sequential 70% & 80% p-mLAD, 99% mLCx, CTO mRCA, elevated LV filling pressure  . Colon polyp 2010  . Diabetes mellitus with complication ([place])   . Glaucoma   . Hyperlipidemia   . Hypertension     Past Surgical History:  Procedure Laterality Date  . APPENDECTOMY    . BREAST BIOPSY Right 1999  . COLONOSCOPY  2010  . COLONOSCOPY WITH PROPOFOL N/A 10/07/2018   Procedure: COLONOSCOPY WITH PROPOFOL;  Surgeon: Lollie Sails, MD;  Location: [facility] ENDOSCOPY;  Service: Endoscopy;  Laterality: N/A;  . CORONARY ANGIOPLASTY    . CORONARY ARTERY BYPASS GRAFT N/A 02/05/2017   Procedure: CORONARY ARTERY BYPASS GRAFTING (CABG) x 4                                                                               LIMA-LAD SEQ SVG-DIAG-OM SVG-PD;  Surgeon: Gaye Pollack, MD;  Location: [place] OR;  Service: Open Heart Surgery;  Laterality: N/A;  . ESOPHAGOGASTRODUODENOSCOPY (EGD) WITH PROPOFOL N/A 10/07/2018   Procedure: ESOPHAGOGASTRODUODENOSCOPY (EGD) WITH PROPOFOL;  Surgeon: Lollie Sails, MD;  Location: [facility] ENDOSCOPY;  Service: Endoscopy;  Laterality: N/A;  . IABP INSERTION N/A 02/04/2017   Procedure: IABP Insertion;  Surgeon: Leonie Man, MD;  Location: [place] CV LAB;  Service: Cardiovascular;  Laterality: N/A;  . LEFT HEART CATH AND CORONARY ANGIOGRAPHY Left 02/03/2017   Procedure: Left Heart Cath and Coronary Angiography;  Surgeon: Nelva Bush, MD;  Location: [place] CV LAB;  Service: Cardiovascular;  Laterality: Left;  . MITRAL VALVE REPAIR N/A 02/05/2017   Procedure: MITRAL VALVE REPLACEMENT (MVR) WITH MAGNA MITRAL EASE PERICARDIAL BIOPROSTHESIS 25 MM;  Surgeon: Gaye Pollack, MD;  Location: [place] OR;  Service: Open Heart Surgery;  Laterality: N/A;  . PARATHYROIDECTOMY     1986  . RIGHT HEART CATH N/A 02/04/2017   Procedure: Right Heart Cath;  Surgeon: Leonie Man, MD;  Location: [place] CV LAB;  Service: Cardiovascular;  Laterality: N/A;  . TEE WITHOUT CARDIOVERSION N/A 02/05/2017   Procedure: TRANSESOPHAGEAL ECHOCARDIOGRAM (TEE);  Surgeon: Gaye Pollack, MD;  Location: [place];  Service: Open Heart Surgery;  Laterality: N/A;    Social History   Socioeconomic History  . Marital status: Widowed    Spouse name: Not on file  . Number of children: Not on file  . Years of education: Not on file  . Highest education level: Not on file  Occupational History  . Not on file  Social Needs  . Financial resource strain: Not on file  . Food insecurity    Worry: Not on file    Inability: Not on file  . Transportation needs    Medical: Not on file    Non-medical: Not on file  Tobacco Use  . Smoking status: Never Smoker  . Smokeless tobacco: Never Used  Substance and Sexual Activity  . Alcohol use: No  . Drug use: Never  . Sexual activity: Not on file  Lifestyle  . Physical activity    Days per week: Not on file    Minutes per session: Not on file  . Stress:  Not on file  Relationships  . Social Herbalist on phone: Not on file    Gets together: Not on file    Attends religious service: Not on file    Active member of club or organization: Not on file    Attends meetings of clubs or organizations: Not on file    Relationship status: Not on file  . Intimate partner violence    Fear of current or ex partner: Not on file    Emotionally abused: Not on file    Physically abused: Not on file    Forced sexual activity: Not on file  Other Topics Concern  . Not on file  Social History Narrative  . Not on file    Family History  Problem Relation Age of Onset  . Diabetes Father   . Diabetes Sister   . Bladder Cancer Neg Hx   . Kidney cancer Neg Hx   . Prostate cancer Neg Hx     No Known Allergies   Review of Systems   Review of Systems: Negative Unless Checked Constitutional: [] Weight loss  [] Fever  [] Chills Cardiac: [] Chest pain   []  Atrial Fibrillation  [] Palpitations   [] Shortness of breath when laying flat   [] Shortness of breath with exertion. [] Shortness of breath at rest Vascular:  [] Pain in legs with walking   [] Pain in legs with standing [] Pain in legs when laying flat   [] Claudication    [] Pain in feet when laying flat    [] History of DVT   [] Phlebitis   [] Swelling in legs   [] Varicose veins   [] Non-healing ulcers Pulmonary:   [] Uses home oxygen   [] Productive cough   [] Hemoptysis   [] Wheeze  [] COPD   [] Asthma Neurologic:  [] Dizziness   [] Seizures  [] Blackouts [] History of stroke   [] History of TIA  [] Aphasia   [] Temporary Blindness   [] Weakness or numbness in arm   [x] Weakness or numbness in leg Musculoskeletal:   [] Joint swelling   [] Joint pain   [] Low back pain  []  History of Knee Replacement [] Arthritis [] back Surgeries  [x]  Spinal Stenosis    Hematologic:  [] Easy bruising  [] Easy bleeding   [] Hypercoagulable state   [x] Anemic Gastrointestinal:  [] Diarrhea   [] Vomiting  [] Gastroesophageal reflux/heartburn    [] Difficulty swallowing. [] Abdominal pain Genitourinary:  [x] Chronic kidney disease   [] Difficult urination  [] Anuric   [] Blood in urine [] Frequent urination  [] Burning with urination   [] Hematuria Skin:  [] Rashes   [] Ulcers [] Wounds Psychological:  [] History of  anxiety   []  History of major depression  []  Memory Difficulties      OBJECTIVE:   Physical Exam  BP (!) 146/74 (BP Location: Right Arm)   Pulse (!) 54   Resp 17   Ht 5\' 3"  (1.6 m)   Wt 154 lb (69.9 kg)   BMI 27.28 kg/m   Gen: WD/WN, NAD Head: /AT, No temporalis wasting.  Ear/Nose/Throat: Hearing grossly intact, nares w/o erythema or drainage Eyes: PER, EOMI, sclera nonicteric.  Neck: Supple, no masses.  No JVD.  Pulmonary:  Good air movement, no use of accessory muscles.  Cardiac: RRR Vascular:  Vessel Right Left  Radial Palpable Palpable  Dorsalis Pedis Not Palpable Not Palpable  Posterior Tibial Not Palpable Not Palpable   Gastrointestinal: soft, non-distended. No guarding/no peritoneal signs.  Musculoskeletal: Uses can for ambulation. Frequent falls  No deformity or atrophy.  Neurologic: Pain and light touch intact in extremities.  Symmetrical.  Speech is fluent. Motor exam as listed above. Psychiatric: Judgment intact, Mood & affect appropriate for pt's clinical situation. Dermatologic: No Venous rashes. No Ulcers Noted.  No changes consistent with cellulitis. Lymph : No Cervical lymphadenopathy, no lichenification or skin changes of chronic lymphedema.       ASSESSMENT AND PLAN:  1. PAD (peripheral artery disease) ([place]) While the patient does have some PAD, it isn't significant enough to cause her lower extremity weakness.  We have deferred further treatment to her neurosurgeon. We will continue to follow on an annual basis due to patient's PAD  - VAS [place] LOWER EXTREMITY ARTERIAL DUPLEX; Future - VAS [place] ABI WITH/WO TBI; Future  2. Hyperlipidemia, unspecified hyperlipidemia type Continue statin as ordered  and reviewed, no changes at this time   3. Type 2 diabetes mellitus with complication (H[facility]) Continue hypoglycemic medications as already ordered, these medications have been reviewed and there are no changes at this time.  Hgb A1C to be monitored as already arranged by primary service    Current Outpatient Medications on File Prior to Visit  Medication Sig Dispense Refill  . acetaminophen (TYLENOL) 500 MG tablet Take 500 mg by mouth every 6 (six) hours as needed for mild pain.     Marland Kitchen amLODipine (NORVASC) 5 MG tablet Take 5 mg by mouth daily.    Marland Kitchen aspirin EC 81 MG tablet Take 1 tablet (81 mg total) by mouth daily. 90 tablet 3  . carvedilol (COREG) 3.125 MG tablet TAKE 1 TABLET BY MOUTH TWICE DAILY 180 tablet 3  . cholecalciferol (VITAMIN D3) 10 MCG (400 UNIT) TABS tablet Take 1,000 Units by mouth.    . diclofenac sodium (VOLTAREN) 1 % GEL Apply topically 4 (four) times daily.    . dorzolamide-timolol (COSOPT) 22.3-6.8 MG/ML ophthalmic solution INSTILL 1 DROP INTO EACH EYE TWICE DAILY    . ferrous sulfate 325 (65 FE) MG tablet Take 325 mg by mouth daily with breakfast.    . furosemide (LASIX) 40 MG tablet Take 40 mg Daily, alternating with 80 mg Daily. 45 tablet 3  . isosorbide mononitrate (IMDUR) 30 MG 24 hr tablet TAKE 1 TABLET BY MOUTH ONCE DAILY 90 tablet 3  . latanoprost (XALATAN) 0.005 % ophthalmic solution Place 1 drop into both eyes at bedtime.     . Potassium Chloride ER 20 MEQ TBCR TAKE 1 TABLET BY MOUTH ONCE DAILY 90 tablet 1  . rosuvastatin (CRESTOR) 40 MG tablet Take 40 mg by mouth daily.    . timolol (BETIMOL) 0.5 % ophthalmic solution Place 1  drop into both eyes 2 (two) times daily.    Marland Kitchen omeprazole (PRILOSEC) 20 MG capsule Take 20 mg by mouth daily.    Marland Kitchen torsemide (DEMADEX) 20 MG tablet Take 20 mg by mouth daily.    . traMADol (ULTRAM) 50 MG tablet Take 1 tablet (50 mg total) by mouth every 8 (eight) hours as needed. (Patient not taking: Reported on 07/27/2019) 15 tablet 0    No current facility-administered medications on file prior to visit.     There are no Patient Instructions on file for this visit. No follow-ups on file.   Kris Hartmann, NP  This note was completed with Sales executive.  Any errors are purely unintentional.

## 2019-11-07 ENCOUNTER — Other Ambulatory Visit (HOSPITAL_COMMUNITY): Payer: Self-pay | Admitting: Cardiology

## 2019-11-14 ENCOUNTER — Institutional Professional Consult (permissible substitution): Payer: Medicare HMO | Admitting: Cardiovascular Disease

## 2019-11-22 DIAGNOSIS — N200 Calculus of kidney: Secondary | ICD-10-CM | POA: Insufficient documentation

## 2019-11-22 DIAGNOSIS — N1832 Chronic kidney disease, stage 3b: Secondary | ICD-10-CM | POA: Insufficient documentation

## 2019-11-22 DIAGNOSIS — I272 Pulmonary hypertension, unspecified: Secondary | ICD-10-CM | POA: Insufficient documentation

## 2019-11-23 ENCOUNTER — Encounter (INDEPENDENT_AMBULATORY_CARE_PROVIDER_SITE_OTHER): Payer: Self-pay

## 2019-11-23 ENCOUNTER — Ambulatory Visit: Payer: Medicare HMO | Admitting: Cardiovascular Disease

## 2019-11-23 ENCOUNTER — Other Ambulatory Visit: Payer: Self-pay

## 2019-11-23 ENCOUNTER — Encounter: Payer: Self-pay | Admitting: Cardiovascular Disease

## 2019-11-23 VITALS — BP 142/70 | HR 60 | Ht 63.0 in | Wt 175.8 lb

## 2019-11-23 DIAGNOSIS — I350 Nonrheumatic aortic (valve) stenosis: Secondary | ICD-10-CM | POA: Diagnosis not present

## 2019-11-23 LAB — BASIC METABOLIC PANEL WITH GFR
BUN/Creatinine Ratio: 11 — ABNORMAL LOW (ref 12–28)
BUN: 19 mg/dL (ref 8–27)
CO2: 22 mmol/L (ref 20–29)
Calcium: 9.7 mg/dL (ref 8.7–10.3)
Chloride: 108 mmol/L — ABNORMAL HIGH (ref 96–106)
Creatinine, Ser: 1.74 mg/dL — ABNORMAL HIGH (ref 0.57–1.00)
GFR calc Af Amer: 31 mL/min/{1.73_m2} — ABNORMAL LOW
GFR calc non Af Amer: 27 mL/min/{1.73_m2} — ABNORMAL LOW
Glucose: 110 mg/dL — ABNORMAL HIGH (ref 65–99)
Potassium: 3.4 mmol/L — ABNORMAL LOW (ref 3.5–5.2)
Sodium: 145 mmol/L — ABNORMAL HIGH (ref 134–144)

## 2019-11-23 LAB — CBC WITH DIFFERENTIAL/PLATELET
Basophils Absolute: 0.1 10*3/uL (ref 0.0–0.2)
Basos: 1 %
EOS (ABSOLUTE): 0.4 10*3/uL (ref 0.0–0.4)
Eos: 8 %
Hematocrit: 40.2 % (ref 34.0–46.6)
Hemoglobin: 12.8 g/dL (ref 11.1–15.9)
Immature Grans (Abs): 0 10*3/uL (ref 0.0–0.1)
Immature Granulocytes: 0 %
Lymphocytes Absolute: 1.4 10*3/uL (ref 0.7–3.1)
Lymphs: 29 %
MCH: 29.2 pg (ref 26.6–33.0)
MCHC: 31.8 g/dL (ref 31.5–35.7)
MCV: 92 fL (ref 79–97)
Monocytes Absolute: 0.6 10*3/uL (ref 0.1–0.9)
Monocytes: 12 %
Neutrophils Absolute: 2.4 10*3/uL (ref 1.4–7.0)
Neutrophils: 50 %
Platelets: 159 10*3/uL (ref 150–450)
RBC: 4.39 x10E6/uL (ref 3.77–5.28)
RDW: 14.3 % (ref 11.7–15.4)
WBC: 4.8 10*3/uL (ref 3.4–10.8)

## 2019-11-23 NOTE — Progress Notes (Signed)
[facility]   MULTIDISCIPLINARY HEART VALVE TEAM  Date:  11/24/2019   ID:  [name] [name], DOB [birth year], MRN [id number]  PCP:  [name] Ruths, MD   Chief Complaint  Patient presents with  . Shortness of Breath     HISTORY OF PRESENT ILLNESS: [name] [name] is a 84 y.o. female who presents for evaluation of aortic stenosis, referred by Dr [name] [name].   The patient has a complex medical history dating back to 2018 when she presented with non-STEMI and cardiogenic shock.  Background medical problems include type 2 diabetes, hypertension, hyperlipidemia, and stage III chronic kidney disease.  The patient was found to have severe multivessel coronary artery disease at that time.  She was initially felt to have severe secondary mitral regurgitation related to ischemic heart disease, but she acutely decompensated requiring intra-aortic balloon pump placement.  TEE demonstrated a flail posterior leaflet with wide-open mitral regurgitation.  She was taken emergently for multivessel CABG and bioprosthetic mitral valve replacement.  The patient has been followed by Dr. Landry Mellow would for chronic heart failure and ischemic cardiomyopathy.  She is followed by nephrology because of the presence of chronic kidney disease.  The patient's LV function has been severely reduced with LVEF approximately 25%. A recent echocardiogram has demonstrated findings concerning for severe low-flow low gradient aortic stenosis with evidence of aortic leaflet calcification and restriction, peak and mean transvalvular gradients of 15 and 8 mmHg, respectively, a peak systolic velocity of 2 m/s, dimensionless index of 0.34, and calculated aortic valve area of 0.7 to 0.8 cm.  She is now referred for consideration of TAVR.  The patient is here alone today. Her daughter is conferenced in via telephone. She is widowed since last year and she lives independently in [place] where she grew up. . She has children  in [place], [place], and [place]. She has a sister-in-law who is her closest family member in the area. She does not recall having a heart murmur in the past. She has no hx of rheumatic fever. The patient complains of shortness of breath with activity, progressive over recent months. She denies chest pain or pressure. She denies leg swelling, orthopnea, or PND. She has episodes of lightheadedness but no syncope. She is now short of breath even with conversation and reports progression over a period of 6 weeks. She denies cough, fever, or chills.   The patient has had regular dental care and reports full dentures. .  Past Medical History:  Diagnosis Date  . [name]    a. LHC 02/03/17 for unstable angina showed severe 3v CAD w/ sequential 70% & 80% p-mLAD, 99% mLCx, CTO mRCA, elevated LV filling pressure  . Colon polyp 2010  . Diabetes mellitus with complication ([place])   . Glaucoma   . Hyperlipidemia   . Hypertension     Current Outpatient Medications  Medication Sig Dispense Refill  . acetaminophen (TYLENOL) 500 MG tablet Take 500 mg by mouth every 6 (six) hours as needed for mild pain.     Marland Kitchen aspirin EC 81 MG tablet Take 1 tablet (81 mg total) by mouth daily. 90 tablet 3  . carvedilol (COREG) 3.125 MG tablet TAKE 1 TABLET BY MOUTH TWICE DAILY (Patient not taking: No sig reported) 180 tablet 3  . diclofenac sodium (VOLTAREN) 1 % GEL Apply 1 application topically 4 (four) times daily as needed (pain).     . dorzolamide-timolol (COSOPT) 22.3-6.8 MG/ML ophthalmic solution Place 1  drop into both eyes 2 (two) times daily.     Marland Kitchen ENTRESTO 24-26 MG Take 1 tablet by mouth 2 (two) times daily.    . ferrous sulfate 325 (65 FE) MG tablet Take 325 mg by mouth daily with breakfast.    . furosemide (LASIX) 40 MG tablet Take 40 mg Daily, alternating with 80 mg Daily. (Patient not taking: Reported on 11/24/2019) 45 tablet 3  . gabapentin (NEURONTIN) 300 MG capsule Take 300 mg by mouth  daily.     . isosorbide mononitrate (IMDUR) 30 MG 24 hr tablet Take 1 tablet by mouth once daily (Patient taking differently: Take 30 mg by mouth daily. ) 90 tablet 0  . latanoprost (XALATAN) 0.005 % ophthalmic solution Place 1 drop into both eyes at bedtime.     . Potassium Chloride ER 20 MEQ TBCR TAKE 1 TABLET BY MOUTH ONCE DAILY (Patient taking differently: Take 20 mEq by mouth daily. ) 90 tablet 1  . rosuvastatin (CRESTOR) 40 MG tablet Take 40 mg by mouth every evening.     . carvedilol (COREG) 12.5 MG tablet Take 12.5 mg by mouth 2 (two) times daily with a meal.    . carvedilol (COREG) 6.25 MG tablet Take 6.25 mg by mouth 2 (two) times daily.    . Magnesium 250 MG TABS Take 250 mg by mouth daily.    Marland Kitchen torsemide (DEMADEX) 20 MG tablet Take 20 mg by mouth daily.     No current facility-administered medications for this visit.     ALLERGIES:   Patient has no known allergies.   SOCIAL HISTORY:  The patient  reports that she has never smoked. She has never used smokeless tobacco. She reports that she does not drink alcohol or use drugs.   FAMILY HISTORY:  The patient's family history includes Diabetes in her father and sister.   REVIEW OF SYSTEMS:  Negative except as per HPI.    All other systems are reviewed and negative.   PHYSICAL EXAM: VS:  BP (!) 142/70   Pulse 60   Ht 5\' 3"  (1.6 m)   Wt 175 lb 12.8 oz (79.7 kg)   SpO2 97%   BMI 31.14 kg/m  , BMI Body mass index is 31.14 kg/m. GEN: Elderly woman, in no acute distress HEENT: normal Neck: No JVD. carotids 2+ without bruits or masses Cardiac: The heart is RRR with a 2/6 systolic murmur at the RUSB, A2 present. No edema. Pedal pulses 2+ = bilaterally  Respiratory:  clear to auscultation bilaterally GI: soft, nontender, nondistended, + BS MS: no deformity or atrophy Skin: warm and dry, no rash Neuro:  Strength and sensation are intact Psych: euthymic mood, full affect  EKG:  EKG from today reviewed and demonstrates sinus  bradycardia, frequent PVC's, St and T wave changes consider inferior and lateral ischemia  RECENT LABS: 11/23/2019: BUN 19; Creatinine, Ser 1.74; Hemoglobin 12.8; Platelets 159; Potassium 3.4; Sodium 145  No results found for requested labs within last 8760 hours.   Estimated Creatinine Clearance: 24.9 mL/min (A) (by C-G formula based on SCr of 1.74 mg/dL (H)).   Wt Readings from Last 3 Encounters:  11/23/19 175 lb 12.8 oz (79.7 kg)  07/27/19 154 lb (69.9 kg)  07/06/19 154 lb (69.9 kg)     CARDIAC STUDIES:  Echo 08-19-2019: LV Ejection Fraction (%) 25   Aortic Valve Stenosis Grade mild   Aortic Valve Regurgitation Grade mild   Aortic Valve Max Velocity (m/s) 2.3 m/sec  Aortic Valve  Stenosis Mean Gradient (mmHg) 8.7 mmHg  Mitral Valve Stenosis Grade bioprosthetic mv   Mitral Valve Regurgitation Grade moderate   Mitral Valve Stenosis Mean Gradient (mmHg) 6.7 mmHg  Tricuspid Valve Regurgitation Grade moderate   Tricuspid Valve Regurgitation Max Velocity (m/s) 3.5 m/sec  Right Ventricle Systolic Pressure (mmHg) 88.3 mmHg  LV End Diastolic Diameter (cm) 5.6 cm  LV End Systolic Diameter (cm) 4.9 cm  LV Septum Wall Thickness (cm) 1.5 cm  LV Posterior Wall Thickness (cm) 0.8 cm  Left Atrium Diameter (cm) 5 cm  Result Narrative            CARDIOLOGY DEPARTMENT          [facility]                  [id number]      [facility]              Acct #: [id number]      [place], [place], [place] [address]    Date: 08/17/2019 10: 61 AM                                Adult  Female  Age: 17 yrs      ECHOCARDIOGRAM REPORT               Outpatient                                [id number]^^[id number]    STUDY:CHEST WALL        TAPE:          MD1: CALLWOOD, DWAYNE DENNIS    ECHO:Yes   DOPPLER:Yes    FILE:          BP: 120/62 mmHg    COLOR:Yes  CONTRAST:No   MACHINE:Philips  RV BIOPSY:No     3D:No SOUND QLTY:Moderate      Height: 63 in   MEDIUM:None                       Weight: 157 lb                                BSA: 1.7 m2 _________________________________________________________________________________________        HISTORY: DOE         REASON: Assess, LV function       INDICATION: SOB (shortness of breath) [R06.02 (ICD-10-CM)] _________________________________________________________________________________________ ECHOCARDIOGRAPHIC MEASUREMENTS 2D DIMENSIONS AORTA         Values  Normal Range  MAIN PA     Values  Normal Range        Annulus: 1.8 cm    [2.1-2.5]     PA Main: nm*    [1.5-2.1]       Aorta Sin: 3.4 cm    [2.7-3.3]  RIGHT VENTRICLE      ST Junction: nm*     [2.3-2.9]     RV Base: nm*    [<4.2]       Asc.Aorta: nm*     [2.3-3.1]     RV Mid: 3.4 cm  [<3.5] LEFT VENTRICLE                   RV Length: nm*    [<  8.6]         LVIDd: 5.6 cm    [3.9-5.3]  INFERIOR VENA CAVA         LVIDs: 4.9 cm            Max. IVC: nm*    [<=2.1]           FS: 12.8 %    [>25]      Min. IVC: nm*          SWT: 1.5 cm    [0.5-0.9]  ------------------          PWT: 0.80 cm   [0.5-0.9]  nm* - not measured LEFT ATRIUM        LA Diam: 5.0 cm    [2.7-3.8]      LA A4C Area: nm*     [<20]       LA Volume: nm*     [22-52] _________________________________________________________________________________________ ECHOCARDIOGRAPHIC DESCRIPTIONS AORTIC ROOT          Size: Normal       Dissection: INDETERM FOR DISSECTION AORTIC VALVE        Leaflets:  Tricuspid          Morphology: MODERATELY THICKENED        Mobility: Fully mobile LEFT VENTRICLE          Size: MODERATELY ENLARGED      Anterior: HYPOCONTRACTILE      Contraction: SEVERE GLOBAL DECREASE     Lateral: HYPOCONTRACTILE       Closest EF: 25% (Estimated)         Septal: HYPOCONTRACTILE       LV Masses: No Masses            Apical: HYPOCONTRACTILE          LVH: MODERATE LVH         Inferior: HYPOCONTRACTILE                           Posterior: AKINETIC      Dias.FxClass: N/A MITRAL VALVE        Leaflets: Normal            Mobility: Fully mobile       Morphology: BIOPROSTHETIC LEFT ATRIUM          Size: SEVERELY ENLARGED      LA Masses: No masses       IA Septum: Normal IAS MAIN PA          Size: Normal PULMONIC VALVE       Morphology: Normal            Mobility: Fully mobile RIGHT VENTRICLE       RV Masses: No Masses             Size: Normal       Free Wall: Normal           Contraction: MOD GLOBAL DECREASE TRICUSPID VALVE        Leaflets: Normal            Mobility: Fully mobile       Morphology: Normal RIGHT ATRIUM          Size: MODERATELY ENLARGED      RA Other: None        RA Mass: No masses PERICARDIUM         Fluid: No effusion INFERIOR VENACAVA          Size: Normal Normal respiratory collapse _________________________________________________________________________________________  DOPPLER ECHO and OTHER SPECIAL PROCEDURES         Aortic: MILD AR          MILD AS             226.2 cm/sec peak vel   20.5 mmHg peak grad             8.7 mmHg mean grad         Mitral: MODERATE MR        BIOPROSTHETIC MV             185.1  cm/sec peak vel   13.7 mmHg peak grad             6.7 mmHg mean grad     1.7 cm^2 by DOPPLER             MV Inflow E Vel = 180.0 cm/sec   MV Annulus E'Vel = 2.3 cm/sec             E/E'Ratio = 78.3       Tricuspid: MODERATE TR        No TS             352.9 cm/sec peak TR vel  59.8 mmHg peak RV pressure       Pulmonary: MILD PR          No PS             59.7 cm/sec peak vel    1.4 mmHg peak grad _________________________________________________________________________________________ INTERPRETATION SEVERE LV SYSTOLIC DYSFUNCTION (See above)  WITH MODERATE LVH MODERATE RV SYSTOLIC DYSFUNCTION (See above) MODERATE VALVULAR REGURGITATION (See above) MILD VALVULAR STENOSIS (See above) AVA(VTI)=.68cm^2 MODERATE MR, TR MILD AR, PR MILD AS EF 25% _________________________________________________________________________________________ Electronically signed by  Lujean Amel, MD on 08/22/2019 03: 06 PM      Performed By: Maurilio Lovely, RDCS   Ordering Physician: Lujean Amel _________________________________________________________________________________________  Other Result Information  Interface, Text Results In - 08/22/2019  3:07 PM EDT                     CARDIOLOGY DEPARTMENT                    [facility]                                    [id number]           [place] #: [id number]           [address], [place], [place] [address]       Date: 08/17/2019 10: 48 AM                                                              Adult   Female   Age: 66 yrs           ECHOCARDIOGRAM REPORT  Outpatient                                                              [facility]      STUDY:CHEST WALL               TAPE:                    MD1: CALLWOOD, DWAYNE DENNIS        ECHO:Yes    DOPPLER:Yes       FILE:                    BP: 120/62 mmHg      COLOR:Yes   CONTRAST:No     MACHINE:Philips  RV BIOPSY:No          3D:No  SOUND QLTY:Moderate            Height: 63 in     MEDIUM:None                                              Weight: 157 lb                                                              BSA: 1.7 m2 _________________________________________________________________________________________               HISTORY: DOE                REASON: Assess, LV function            INDICATION: SOB (shortness of breath) [R06.02 (ICD-10-CM)] _________________________________________________________________________________________ ECHOCARDIOGRAPHIC MEASUREMENTS 2D DIMENSIONS AORTA                  Values   Normal Range   MAIN PA         Values    Normal Range               Annulus: 1.8 cm       [2.1-2.5]         PA Main: nm*       [1.5-2.1]             Aorta Sin: 3.4 cm       [2.7-3.3]    RIGHT VENTRICLE           ST Junction: nm*          [2.3-2.9]         RV Base: nm*       [<4.2]             Asc.Aorta: nm*          [2.3-3.1]          RV Mid: 3.4 cm    [<3.5] LEFT VENTRICLE  RV Length: nm*       [<8.6]                 LVIDd: 5.6 cm       [3.9-5.3]    INFERIOR VENA CAVA                 LVIDs: 4.9 cm                        Max. IVC: nm*       [<=2.1]                    FS: 12.8 %       [>25]            Min. IVC: nm*                   SWT: 1.5 cm       [0.5-0.9]    ------------------                   PWT: 0.80 cm      [0.5-0.9]    nm* - not measured LEFT ATRIUM               LA Diam: 5.0 cm       [2.7-3.8]           LA A4C Area: nm*          [<20]             LA Volume: nm*          [22-52] _________________________________________________________________________________________ ECHOCARDIOGRAPHIC DESCRIPTIONS AORTIC ROOT                  Size: Normal            Dissection: INDETERM FOR DISSECTION AORTIC VALVE               Leaflets: Tricuspid                   Morphology: MODERATELY THICKENED              Mobility: Fully mobile LEFT VENTRICLE                  Size: MODERATELY ENLARGED           Anterior: HYPOCONTRACTILE           Contraction: SEVERE GLOBAL DECREASE         Lateral: HYPOCONTRACTILE            Closest EF: 25% (Estimated)                 Septal: HYPOCONTRACTILE             LV Masses: No Masses                       Apical: HYPOCONTRACTILE                   LVH: MODERATE LVH                  Inferior: HYPOCONTRACTILE                                                     Posterior: AKINETIC  Dias.FxClass: N/A MITRAL VALVE              Leaflets: Normal                        Mobility: Fully mobile            Morphology: BIOPROSTHETIC LEFT ATRIUM                  Size: SEVERELY ENLARGED            LA Masses: No masses             IA Septum: Normal IAS MAIN PA                  Size: Normal PULMONIC VALVE            Morphology: Normal                        Mobility: Fully mobile RIGHT VENTRICLE             RV Masses: No Masses                         Size: Normal             Free Wall: Normal                     Contraction: MOD GLOBAL DECREASE TRICUSPID VALVE              Leaflets: Normal                        Mobility: Fully mobile            Morphology: Normal RIGHT ATRIUM                  Size: MODERATELY ENLARGED           RA Other: None               RA Mass: No masses PERICARDIUM                 Fluid: No effusion INFERIOR VENACAVA                  Size: Normal Normal respiratory collapse _________________________________________________________________________________________  DOPPLER ECHO and OTHER SPECIAL PROCEDURES                Aortic: MILD AR                    MILD AS                        226.2 cm/sec peak vel      20.5 mmHg peak grad                        8.7 mmHg mean grad                Mitral: MODERATE MR                BIOPROSTHETIC MV                         185.1 cm/sec peak vel      13.7 mmHg peak grad  6.7 mmHg mean grad         1.7 cm^2 by DOPPLER                        MV Inflow E Vel = 180.0 cm/sec      MV Annulus E'Vel = 2.3 cm/sec                        E/E'Ratio = 78.3             Tricuspid: MODERATE TR                No TS                        352.9 cm/sec peak TR vel   59.8 mmHg peak RV pressure             Pulmonary: MILD PR                    No PS                        59.7 cm/sec peak vel       1.4 mmHg peak grad _________________________________________________________________________________________ INTERPRETATION SEVERE LV SYSTOLIC DYSFUNCTION (See above)   WITH MODERATE LVH MODERATE RV SYSTOLIC DYSFUNCTION (See above) MODERATE VALVULAR REGURGITATION (See above) MILD VALVULAR STENOSIS (See above) AVA(VTI)=.68cm^2 MODERATE MR, TR MILD AR, PR MILD AS EF 25%    Cardiac Surgery 02/04/2017: Procedure:  1. Median Sternotomy 2. Extracorporeal circulation 3.   Coronary artery bypass grafting x 4   Left internal mammary graft to the LAD  Sequential SVG to diagonal and OM  SVG to PDA  4.   Endoscopic vein harvest from the right leg 5.   Chordal sparing Mitral valve replacement using a 25 mm Edwards Magna-Ease pericardial valve  ASSESSMENT AND PLAN: 84 yo woman with severe ischemic heart disease, acute on chronic systolic heart failure with progressive NYHA functional class IIIb symptoms, mitral valve disease s/p biological mitral valve replacement, presenting for evaluation of suspected low flow low gradient aortic stenosis (Stage D2 disease).  I have reviewed the natural history of aortic stenosis with the patient and their family members who are present today. We have discussed the limitations of medical therapy and the poor prognosis associated with symptomatic aortic stenosis. We have reviewed potential treatment options, including palliative medical therapy, conventional surgical aortic  valve replacement, and transcatheter aortic valve replacement. We discussed treatment options in the context of the patient's specific comorbid medical conditions.    I have personally reviewed her echo study demonstrating severe LV systolic dysfunction with LVEF < 30%. The aortic valve is trileaflet with moderately calcified, restricted leaflet mobility. The mean gradient is 9 mmHg with a peak transaortic velocity of 230 cm/s without significant AI. The calculated AVA is 0.7 square cm with LVOT diameter measured at 1.8 cm.   We discussed the possibility that she may have hemodynamically significant AS even though her gradient is only mildly elevated. I suspect her aortic stenosis is moderate, but considering her progressive symptoms of heart failure, further evaluation with R/L heart catheterization is indicated. She has CKD stage IIIb with GFR 31, and a limited dye study will be performed to evaluate graft patency. A full hemodynamic study will be performed to evaluate right heart pressures and aortic stenosis severity. I have reviewed the risks, indications,  and alternatives to cardiac catheterization, possible angioplasty, and stenting with the patient. Risks include but are not limited to bleeding, infection, vascular injury, stroke, myocardial infection, arrhythmia, kidney injury, radiation-related injury in the case of prolonged fluoroscopy use, emergency cardiac surgery, and death. The patient understands the risks of serious complication is 1-2 in 4451 with diagnostic cardiac cath and 1-2% or less with angioplasty/stenting. She is advised on holding her PM lasix dose the day before the procedure. She will also hold entresto and lasix the morning of her procedure. Further plans pending cath results. Other considerations would include a dobutamine echo study or a gated cardiac CTA, but I am concerned about further iodinated contrast studies in the setting of CKD unless absolutely necessary. Plans  discussed with her daughter and all questions answered.   Deatra James 11/24/2019 2:42 PM     [place] [address]  [phone] (office) [phone] (fax)

## 2019-11-23 NOTE — Patient Instructions (Addendum)
COVID SCREENING INFORMATION: You are scheduled for your COVID screening on Friday, November 25, 2019 between 10:30AM and 12:30PM. [place]: You are scheduled for a Cardiac Catheterization on Tuesday, December 8 with Dr. Sherren Mocha.  1. Please arrive at the [facility] (Main Entrance A) at [facility]: [address] [place], [place] [address] at 8:00 AM. You are allowed ONE visitor in the waiting room during your procedure. Both you and your guest must wear masks.  Special note: Every effort is made to have your procedure done on time. Please understand that emergencies sometimes delay scheduled procedures.  2. Diet: Do not eat solid foods after midnight.  You may have clear liquids until 5am upon the day of the procedure.  3. Labs: TODAY!  4. Medication instructions in preparation for your procedure:  1) HOLD ENTRESTO the day before and day of your heart catheterization  2) HOLD DIURETICS (Lasix, Torsemide) the morning of your cath  3) MAKE SURE TO TAKE ASPIRIN 81 mg the morning of your procedure  4) You may take your other medications as directed with sips of water  5. Plan for one night stay--bring personal belongings. 6. Bring a current list of your medications and current insurance cards. 7. You MUST have a responsible person to drive you home. 8. Someone MUST be with you the first 24 hours after you arrive home or your discharge will be delayed. 9. Please wear clothes that are easy to get on and off and wear slip-on shoes.  Thank you for allowing [place] to care for you!   -- [facility]

## 2019-11-23 NOTE — H&P (View-Only) (Signed)
[facility]   MULTIDISCIPLINARY HEART VALVE TEAM  Date:  11/24/2019   ID:  [name] [name], DOB 1935/03/16, MRN [id number]  PCP:  Kirk Ruths, MD   Chief Complaint  Patient presents with  . Shortness of Breath     HISTORY OF PRESENT ILLNESS: [name] [name] is a 84 y.o. female who presents for evaluation of aortic stenosis, referred by Dr Clayborn Bigness.   The patient has a complex medical history dating back to 2018 when she presented with non-STEMI and cardiogenic shock.  Background medical problems include type 2 diabetes, hypertension, hyperlipidemia, and stage III chronic kidney disease.  The patient was found to have severe multivessel coronary artery disease at that time.  She was initially felt to have severe secondary mitral regurgitation related to ischemic heart disease, but she acutely decompensated requiring intra-aortic balloon pump placement.  TEE demonstrated a flail posterior leaflet with wide-open mitral regurgitation.  She was taken emergently for multivessel CABG and bioprosthetic mitral valve replacement.  The patient has been followed by Dr. Landry Mellow would for chronic heart failure and ischemic cardiomyopathy.  She is followed by nephrology because of the presence of chronic kidney disease.  The patient's LV function has been severely reduced with LVEF approximately 25%. A recent echocardiogram has demonstrated findings concerning for severe low-flow low gradient aortic stenosis with evidence of aortic leaflet calcification and restriction, peak and mean transvalvular gradients of 15 and 8 mmHg, respectively, a peak systolic velocity of 2 m/s, dimensionless index of 0.34, and calculated aortic valve area of 0.7 to 0.8 cm.  She is now referred for consideration of TAVR.  The patient is here alone today. Her daughter is conferenced in via telephone. She is widowed since last year and she lives independently in [place] where she grew up. . She has children  in [place], [place], and [place]. She has a sister-in-law who is her closest family member in the area. She does not recall having a heart murmur in the past. She has no hx of rheumatic fever. The patient complains of shortness of breath with activity, progressive over recent months. She denies chest pain or pressure. She denies leg swelling, orthopnea, or PND. She has episodes of lightheadedness but no syncope. She is now short of breath even with conversation and reports progression over a period of 6 weeks. She denies cough, fever, or chills.   The patient has had regular dental care and reports full dentures. .  Past Medical History:  Diagnosis Date  . [name]    a. LHC 02/03/17 for unstable angina showed severe 3v CAD w/ sequential 70% & 80% p-mLAD, 99% mLCx, CTO mRCA, elevated LV filling pressure  . Colon polyp 2010  . Diabetes mellitus with complication ([place])   . Glaucoma   . Hyperlipidemia   . Hypertension     Current Outpatient Medications  Medication Sig Dispense Refill  . acetaminophen (TYLENOL) 500 MG tablet Take 500 mg by mouth every 6 (six) hours as needed for mild pain.     Marland Kitchen aspirin EC 81 MG tablet Take 1 tablet (81 mg total) by mouth daily. 90 tablet 3  . carvedilol (COREG) 3.125 MG tablet TAKE 1 TABLET BY MOUTH TWICE DAILY (Patient not taking: No sig reported) 180 tablet 3  . diclofenac sodium (VOLTAREN) 1 % GEL Apply 1 application topically 4 (four) times daily as needed (pain).     . dorzolamide-timolol (COSOPT) 22.3-6.8 MG/ML ophthalmic solution Place 1  drop into both eyes 2 (two) times daily.     Marland Kitchen ENTRESTO 24-26 MG Take 1 tablet by mouth 2 (two) times daily.    . ferrous sulfate 325 (65 FE) MG tablet Take 325 mg by mouth daily with breakfast.    . furosemide (LASIX) 40 MG tablet Take 40 mg Daily, alternating with 80 mg Daily. (Patient not taking: Reported on 11/24/2019) 45 tablet 3  . gabapentin (NEURONTIN) 300 MG capsule Take 300 mg by mouth  daily.     . isosorbide mononitrate (IMDUR) 30 MG 24 hr tablet Take 1 tablet by mouth once daily (Patient taking differently: Take 30 mg by mouth daily. ) 90 tablet 0  . latanoprost (XALATAN) 0.005 % ophthalmic solution Place 1 drop into both eyes at bedtime.     . Potassium Chloride ER 20 MEQ TBCR TAKE 1 TABLET BY MOUTH ONCE DAILY (Patient taking differently: Take 20 mEq by mouth daily. ) 90 tablet 1  . rosuvastatin (CRESTOR) 40 MG tablet Take 40 mg by mouth every evening.     . carvedilol (COREG) 12.5 MG tablet Take 12.5 mg by mouth 2 (two) times daily with a meal.    . carvedilol (COREG) 6.25 MG tablet Take 6.25 mg by mouth 2 (two) times daily.    . Magnesium 250 MG TABS Take 250 mg by mouth daily.    Marland Kitchen torsemide (DEMADEX) 20 MG tablet Take 20 mg by mouth daily.     No current facility-administered medications for this visit.     ALLERGIES:   Patient has no known allergies.   SOCIAL HISTORY:  The patient  reports that she has never smoked. She has never used smokeless tobacco. She reports that she does not drink alcohol or use drugs.   FAMILY HISTORY:  The patient's family history includes Diabetes in her father and sister.   REVIEW OF SYSTEMS:  Negative except as per HPI.    All other systems are reviewed and negative.   PHYSICAL EXAM: VS:  BP (!) 142/70   Pulse 60   Ht 5\' 3"  (1.6 m)   Wt 175 lb 12.8 oz (79.7 kg)   SpO2 97%   BMI 31.14 kg/m  , BMI Body mass index is 31.14 kg/m. GEN: Elderly woman, in no acute distress HEENT: normal Neck: No JVD. carotids 2+ without bruits or masses Cardiac: The heart is RRR with a 2/6 systolic murmur at the RUSB, A2 present. No edema. Pedal pulses 2+ = bilaterally  Respiratory:  clear to auscultation bilaterally GI: soft, nontender, nondistended, + BS MS: no deformity or atrophy Skin: warm and dry, no rash Neuro:  Strength and sensation are intact Psych: euthymic mood, full affect  EKG:  EKG from today reviewed and demonstrates sinus  bradycardia, frequent PVC's, St and T wave changes consider inferior and lateral ischemia  RECENT LABS: 11/23/2019: BUN 19; Creatinine, Ser 1.74; Hemoglobin 12.8; Platelets 159; Potassium 3.4; Sodium 145  No results found for requested labs within last 8760 hours.   Estimated Creatinine Clearance: 24.9 mL/min (A) (by C-G formula based on SCr of 1.74 mg/dL (H)).   Wt Readings from Last 3 Encounters:  11/23/19 175 lb 12.8 oz (79.7 kg)  07/27/19 154 lb (69.9 kg)  07/06/19 154 lb (69.9 kg)     CARDIAC STUDIES:  Echo 08-19-2019: LV Ejection Fraction (%) 25   Aortic Valve Stenosis Grade mild   Aortic Valve Regurgitation Grade mild   Aortic Valve Max Velocity (m/s) 2.3 m/sec  Aortic Valve  Stenosis Mean Gradient (mmHg) 8.7 mmHg  Mitral Valve Stenosis Grade bioprosthetic mv   Mitral Valve Regurgitation Grade moderate   Mitral Valve Stenosis Mean Gradient (mmHg) 6.7 mmHg  Tricuspid Valve Regurgitation Grade moderate   Tricuspid Valve Regurgitation Max Velocity (m/s) 3.5 m/sec  Right Ventricle Systolic Pressure (mmHg) 54.6 mmHg  LV End Diastolic Diameter (cm) 5.6 cm  LV End Systolic Diameter (cm) 4.9 cm  LV Septum Wall Thickness (cm) 1.5 cm  LV Posterior Wall Thickness (cm) 0.8 cm  Left Atrium Diameter (cm) 5 cm  Result Narrative            CARDIOLOGY DEPARTMENT          [facility]                  [id number]      [facility]              Acct #: [id number]      [address], [place], [place] [address]    Date: 08/17/2019 10: 64 AM                                Adult  Female  Age: 48 yrs      ECHOCARDIOGRAM REPORT               Outpatient                                [id number]    STUDY:CHEST WALL        TAPE:          MD1: CALLWOOD, DWAYNE DENNIS    ECHO:Yes   DOPPLER:Yes    FILE:          BP: 120/62 mmHg    COLOR:Yes  CONTRAST:No   MACHINE:Philips  RV BIOPSY:No     3D:No SOUND QLTY:Moderate      Height: 63 in   MEDIUM:None                       Weight: 157 lb                                BSA: 1.7 m2 _________________________________________________________________________________________        HISTORY: DOE         REASON: Assess, LV function       INDICATION: SOB (shortness of breath) [R06.02 (ICD-10-CM)] _________________________________________________________________________________________ ECHOCARDIOGRAPHIC MEASUREMENTS 2D DIMENSIONS AORTA         Values  Normal Range  MAIN PA     Values  Normal Range        Annulus: 1.8 cm    [2.1-2.5]     PA Main: nm*    [1.5-2.1]       Aorta Sin: 3.4 cm    [2.7-3.3]  RIGHT VENTRICLE      ST Junction: nm*     [2.3-2.9]     RV Base: nm*    [<4.2]       Asc.Aorta: nm*     [2.3-3.1]     RV Mid: 3.4 cm  [<3.5] LEFT VENTRICLE                   RV Length: nm*    [<  8.6]         LVIDd: 5.6 cm    [3.9-5.3]  INFERIOR VENA CAVA         LVIDs: 4.9 cm            Max. IVC: nm*    [<=2.1]           FS: 12.8 %    [>25]      Min. IVC: nm*          SWT: 1.5 cm    [0.5-0.9]  ------------------          PWT: 0.80 cm   [0.5-0.9]  nm* - not measured LEFT ATRIUM        LA Diam: 5.0 cm    [2.7-3.8]      LA A4C Area: nm*     [<20]       LA Volume: nm*     [22-52] _________________________________________________________________________________________ ECHOCARDIOGRAPHIC DESCRIPTIONS AORTIC ROOT          Size: Normal       Dissection: INDETERM FOR DISSECTION AORTIC VALVE        Leaflets:  Tricuspid          Morphology: MODERATELY THICKENED        Mobility: Fully mobile LEFT VENTRICLE          Size: MODERATELY ENLARGED      Anterior: HYPOCONTRACTILE      Contraction: SEVERE GLOBAL DECREASE     Lateral: HYPOCONTRACTILE       Closest EF: 25% (Estimated)         Septal: HYPOCONTRACTILE       LV Masses: No Masses            Apical: HYPOCONTRACTILE          LVH: MODERATE LVH         Inferior: HYPOCONTRACTILE                           Posterior: AKINETIC      Dias.FxClass: N/A MITRAL VALVE        Leaflets: Normal            Mobility: Fully mobile       Morphology: BIOPROSTHETIC LEFT ATRIUM          Size: SEVERELY ENLARGED      LA Masses: No masses       IA Septum: Normal IAS MAIN PA          Size: Normal PULMONIC VALVE       Morphology: Normal            Mobility: Fully mobile RIGHT VENTRICLE       RV Masses: No Masses             Size: Normal       Free Wall: Normal           Contraction: MOD GLOBAL DECREASE TRICUSPID VALVE        Leaflets: Normal            Mobility: Fully mobile       Morphology: Normal RIGHT ATRIUM          Size: MODERATELY ENLARGED      RA Other: None        RA Mass: No masses PERICARDIUM         Fluid: No effusion INFERIOR VENACAVA          Size: Normal Normal respiratory collapse _________________________________________________________________________________________  DOPPLER ECHO and OTHER SPECIAL PROCEDURES         Aortic: MILD AR          MILD AS             226.2 cm/sec peak vel   20.5 mmHg peak grad             8.7 mmHg mean grad         Mitral: MODERATE MR        BIOPROSTHETIC MV             185.1  cm/sec peak vel   13.7 mmHg peak grad             6.7 mmHg mean grad     1.7 cm^2 by DOPPLER             MV Inflow E Vel = 180.0 cm/sec   MV Annulus E'Vel = 2.3 cm/sec             E/E'Ratio = 78.3       Tricuspid: MODERATE TR        No TS             352.9 cm/sec peak TR vel  59.8 mmHg peak RV pressure       Pulmonary: MILD PR          No PS             59.7 cm/sec peak vel    1.4 mmHg peak grad _________________________________________________________________________________________ INTERPRETATION SEVERE LV SYSTOLIC DYSFUNCTION (See above)  WITH MODERATE LVH MODERATE RV SYSTOLIC DYSFUNCTION (See above) MODERATE VALVULAR REGURGITATION (See above) MILD VALVULAR STENOSIS (See above) AVA(VTI)=.68cm^2 MODERATE MR, TR MILD AR, PR MILD AS EF 25% _________________________________________________________________________________________ Electronically signed by  Lujean Amel, MD on 08/22/2019 03: 06 PM      Performed By: Maurilio Lovely, RDCS   Ordering Physician: Lujean Amel _________________________________________________________________________________________  Other Result Information  Interface, Text Results In - 08/22/2019  3:07 PM EDT                     CARDIOLOGY DEPARTMENT                    [facility]                                    [id number]           [facility] #: [id number]           [address], [place], [place] [address]       Date: 08/17/2019 10: 29 AM                                                              Adult   Female   Age: 55 yrs           ECHOCARDIOGRAM REPORT  Outpatient                                                              [facility]      STUDY:CHEST WALL               TAPE:                    MD1: CALLWOOD, DWAYNE DENNIS        ECHO:Yes    DOPPLER:Yes       FILE:                    BP: 120/62 mmHg      COLOR:Yes   CONTRAST:No     MACHINE:Philips  RV BIOPSY:No          3D:No  SOUND QLTY:Moderate            Height: 63 in     MEDIUM:None                                              Weight: 157 lb                                                              BSA: 1.7 m2 _________________________________________________________________________________________               HISTORY: DOE                REASON: Assess, LV function            INDICATION: SOB (shortness of breath) [R06.02 (ICD-10-CM)] _________________________________________________________________________________________ ECHOCARDIOGRAPHIC MEASUREMENTS 2D DIMENSIONS AORTA                  Values   Normal Range   MAIN PA         Values    Normal Range               Annulus: 1.8 cm       [2.1-2.5]         PA Main: nm*       [1.5-2.1]             Aorta Sin: 3.4 cm       [2.7-3.3]    RIGHT VENTRICLE           ST Junction: nm*          [2.3-2.9]         RV Base: nm*       [<4.2]             Asc.Aorta: nm*          [2.3-3.1]          RV Mid: 3.4 cm    [<3.5] LEFT VENTRICLE  RV Length: nm*       [<8.6]                 LVIDd: 5.6 cm       [3.9-5.3]    INFERIOR VENA CAVA                 LVIDs: 4.9 cm                        Max. IVC: nm*       [<=2.1]                    FS: 12.8 %       [>25]            Min. IVC: nm*                   SWT: 1.5 cm       [0.5-0.9]    ------------------                   PWT: 0.80 cm      [0.5-0.9]    nm* - not measured LEFT ATRIUM               LA Diam: 5.0 cm       [2.7-3.8]           LA A4C Area: nm*          [<20]             LA Volume: nm*          [22-52] _________________________________________________________________________________________ ECHOCARDIOGRAPHIC DESCRIPTIONS AORTIC ROOT                  Size: Normal            Dissection: INDETERM FOR DISSECTION AORTIC VALVE               Leaflets: Tricuspid                   Morphology: MODERATELY THICKENED              Mobility: Fully mobile LEFT VENTRICLE                  Size: MODERATELY ENLARGED           Anterior: HYPOCONTRACTILE           Contraction: SEVERE GLOBAL DECREASE         Lateral: HYPOCONTRACTILE            Closest EF: 25% (Estimated)                 Septal: HYPOCONTRACTILE             LV Masses: No Masses                       Apical: HYPOCONTRACTILE                   LVH: MODERATE LVH                  Inferior: HYPOCONTRACTILE                                                     Posterior: AKINETIC  Dias.FxClass: N/A MITRAL VALVE              Leaflets: Normal                        Mobility: Fully mobile            Morphology: BIOPROSTHETIC LEFT ATRIUM                  Size: SEVERELY ENLARGED            LA Masses: No masses             IA Septum: Normal IAS MAIN PA                  Size: Normal PULMONIC VALVE            Morphology: Normal                        Mobility: Fully mobile RIGHT VENTRICLE             RV Masses: No Masses                         Size: Normal             Free Wall: Normal                     Contraction: MOD GLOBAL DECREASE TRICUSPID VALVE              Leaflets: Normal                        Mobility: Fully mobile            Morphology: Normal RIGHT ATRIUM                  Size: MODERATELY ENLARGED           RA Other: None               RA Mass: No masses PERICARDIUM                 Fluid: No effusion INFERIOR VENACAVA                  Size: Normal Normal respiratory collapse _________________________________________________________________________________________  DOPPLER ECHO and OTHER SPECIAL PROCEDURES                Aortic: MILD AR                    MILD AS                        226.2 cm/sec peak vel      20.5 mmHg peak grad                        8.7 mmHg mean grad                Mitral: MODERATE MR                BIOPROSTHETIC MV                         185.1 cm/sec peak vel      13.7 mmHg peak grad  6.7 mmHg mean grad         1.7 cm^2 by DOPPLER                        MV Inflow E Vel = 180.0 cm/sec      MV Annulus E'Vel = 2.3 cm/sec                        E/E'Ratio = 78.3             Tricuspid: MODERATE TR                No TS                        352.9 cm/sec peak TR vel   59.8 mmHg peak RV pressure             Pulmonary: MILD PR                    No PS                        59.7 cm/sec peak vel       1.4 mmHg peak grad _________________________________________________________________________________________ INTERPRETATION SEVERE LV SYSTOLIC DYSFUNCTION (See above)   WITH MODERATE LVH MODERATE RV SYSTOLIC DYSFUNCTION (See above) MODERATE VALVULAR REGURGITATION (See above) MILD VALVULAR STENOSIS (See above) AVA(VTI)=.68cm^2 MODERATE MR, TR MILD AR, PR MILD AS EF 25%    Cardiac Surgery 02/04/2017: Procedure:  1. Median Sternotomy 2. Extracorporeal circulation 3.   Coronary artery bypass grafting x 4   Left internal mammary graft to the LAD  Sequential SVG to diagonal and OM  SVG to PDA  4.   Endoscopic vein harvest from the right leg 5.   Chordal sparing Mitral valve replacement using a 25 mm Edwards Magna-Ease pericardial valve  ASSESSMENT AND PLAN: 84 yo woman with severe ischemic heart disease, acute on chronic systolic heart failure with progressive NYHA functional class IIIb symptoms, mitral valve disease s/p biological mitral valve replacement, presenting for evaluation of suspected low flow low gradient aortic stenosis (Stage D2 disease).  I have reviewed the natural history of aortic stenosis with the patient and their family members who are present today. We have discussed the limitations of medical therapy and the poor prognosis associated with symptomatic aortic stenosis. We have reviewed potential treatment options, including palliative medical therapy, conventional surgical aortic  valve replacement, and transcatheter aortic valve replacement. We discussed treatment options in the context of the patient's specific comorbid medical conditions.    I have personally reviewed her echo study demonstrating severe LV systolic dysfunction with LVEF < 30%. The aortic valve is trileaflet with moderately calcified, restricted leaflet mobility. The mean gradient is 9 mmHg with a peak transaortic velocity of 230 cm/s without significant AI. The calculated AVA is 0.7 square cm with LVOT diameter measured at 1.8 cm.   We discussed the possibility that she may have hemodynamically significant AS even though her gradient is only mildly elevated. I suspect her aortic stenosis is moderate, but considering her progressive symptoms of heart failure, further evaluation with R/L heart catheterization is indicated. She has CKD stage IIIb with GFR 31, and a limited dye study will be performed to evaluate graft patency. A full hemodynamic study will be performed to evaluate right heart pressures and aortic stenosis severity. I have reviewed the risks, indications,  and alternatives to cardiac catheterization, possible angioplasty, and stenting with the patient. Risks include but are not limited to bleeding, infection, vascular injury, stroke, myocardial infection, arrhythmia, kidney injury, radiation-related injury in the case of prolonged fluoroscopy use, emergency cardiac surgery, and death. The patient understands the risks of serious complication is 1-2 in 9969 with diagnostic cardiac cath and 1-2% or less with angioplasty/stenting. She is advised on holding her PM lasix dose the day before the procedure. She will also hold entresto and lasix the morning of her procedure. Further plans pending cath results. Other considerations would include a dobutamine echo study or a gated cardiac CTA, but I am concerned about further iodinated contrast studies in the setting of CKD unless absolutely necessary. Plans  discussed with her daughter and all questions answered.   Deatra James 11/24/2019 2:42 PM     [address] [place] [address]  [phone] (office) [phone] (fax)

## 2019-11-24 ENCOUNTER — Encounter: Payer: Self-pay | Admitting: Cardiovascular Disease

## 2019-11-25 ENCOUNTER — Other Ambulatory Visit
Admission: RE | Admit: 2019-11-25 | Discharge: 2019-11-25 | Disposition: A | Discharge status: Home / Self Care | Payer: Medicare HMO | Source: Ambulatory Visit | Attending: Cardiovascular Disease | Admitting: Cardiovascular Disease

## 2019-11-25 DIAGNOSIS — Z01812 Encounter for preprocedural laboratory examination: Secondary | ICD-10-CM | POA: Diagnosis present

## 2019-11-25 DIAGNOSIS — Z20828 Contact with and (suspected) exposure to other viral communicable diseases: Secondary | ICD-10-CM | POA: Diagnosis not present

## 2019-11-25 LAB — SARS CORONAVIRUS 2 (TAT 6-24 HRS): SARS Coronavirus 2: NEGATIVE

## 2019-11-28 ENCOUNTER — Telehealth: Payer: Self-pay | Admitting: *Deleted

## 2019-11-28 NOTE — Telephone Encounter (Addendum)
Pt contacted pre-catheterization scheduled at [facility] for: Tuesday November 29, 2019 1:30 PM Verified arrival time and place: [facility]) at: 8:30 AM-pre procedure hydration Per Dr Apolonio Schneiders flow rate 1 ml/kg no bolus.  No solid food after midnight prior to cath, clear liquids until 5 AM day of procedure. Contrast allergy: no  Hold: Torsemide/KCl-AM of procedure-pt states she does not take daily. Pt states she does not take lasix. Entresto-PM prior/AM of procedure.  AM meds can be  taken pre-cath with sip of water including: ASA 81 mg   Confirmed patient has responsible adult to drive home post procedure and observe 24 hours after arriving home: yes  Currently, due to Covid-19 pandemic, only one support person will be allowed with patient. Must be the same support person for that patient's entire stay, will be screened and required to wear a mask. They will be asked to wait in the waiting room for the duration of the patient's stay.  Patients are required to wear a mask when they enter the hospital.      COVID-19 Pre-Screening Questions:  . In the past 7 to 10 days have you had a cough,  shortness of breath, headache, congestion, fever (100 or greater) body aches, chills, sore throat, or sudden loss of taste or sense of smell? no . Have you been around anyone with known Covid 19? no . Have you been around anyone who is awaiting Covid 19 test results in the past 7 to 10 days? no . Have you been around anyone who has been exposed to Covid 19, or has mentioned symptoms of Covid 19 within the past 7 to 10 days? no

## 2019-11-29 ENCOUNTER — Ambulatory Visit (HOSPITAL_COMMUNITY)
Admission: RE | Admit: 2019-11-29 | Discharge: 2019-11-29 | Disposition: A | Discharge status: Home / Self Care | Payer: Medicare HMO | Attending: Cardiovascular Disease | Admitting: Cardiovascular Disease

## 2019-11-29 ENCOUNTER — Other Ambulatory Visit: Payer: Self-pay

## 2019-11-29 ENCOUNTER — Encounter (HOSPITAL_COMMUNITY)
Admission: RE | Disposition: A | Discharge status: Home / Self Care | Payer: Self-pay | Source: Home / Self Care | Attending: Cardiovascular Disease

## 2019-11-29 DIAGNOSIS — I130 Hypertensive heart and chronic kidney disease with heart failure and stage 1 through stage 4 chronic kidney disease, or unspecified chronic kidney disease: Secondary | ICD-10-CM | POA: Insufficient documentation

## 2019-11-29 DIAGNOSIS — E785 Hyperlipidemia, unspecified: Secondary | ICD-10-CM | POA: Insufficient documentation

## 2019-11-29 DIAGNOSIS — Z7982 Long term (current) use of aspirin: Secondary | ICD-10-CM | POA: Diagnosis not present

## 2019-11-29 DIAGNOSIS — I080 Rheumatic disorders of both mitral and aortic valves: Secondary | ICD-10-CM | POA: Insufficient documentation

## 2019-11-29 DIAGNOSIS — I5023 Acute on chronic systolic (congestive) heart failure: Secondary | ICD-10-CM | POA: Diagnosis not present

## 2019-11-29 DIAGNOSIS — Z953 Presence of xenogenic heart valve: Secondary | ICD-10-CM | POA: Diagnosis not present

## 2019-11-29 DIAGNOSIS — E1122 Type 2 diabetes mellitus with diabetic chronic kidney disease: Secondary | ICD-10-CM | POA: Diagnosis not present

## 2019-11-29 DIAGNOSIS — Z79899 Other long term (current) drug therapy: Secondary | ICD-10-CM | POA: Diagnosis not present

## 2019-11-29 DIAGNOSIS — N1832 Chronic kidney disease, stage 3b: Secondary | ICD-10-CM | POA: Insufficient documentation

## 2019-11-29 DIAGNOSIS — I5022 Chronic systolic (congestive) heart failure: Secondary | ICD-10-CM | POA: Diagnosis present

## 2019-11-29 DIAGNOSIS — Z951 Presence of aortocoronary bypass graft: Secondary | ICD-10-CM | POA: Insufficient documentation

## 2019-11-29 DIAGNOSIS — I2510 Atherosclerotic heart disease of native coronary artery without angina pectoris: Secondary | ICD-10-CM | POA: Diagnosis not present

## 2019-11-29 HISTORY — PX: RIGHT/LEFT HEART CATH AND CORONARY/GRAFT ANGIOGRAPHY: CATH118267

## 2019-11-29 LAB — POCT I-STAT EG7
Acid-base deficit: 1 mmol/L (ref 0.0–2.0)
Bicarbonate: 25.2 mmol/L (ref 20.0–28.0)
Bicarbonate: 25.8 mmol/L (ref 20.0–28.0)
Calcium, Ion: 1.19 mmol/L (ref 1.15–1.40)
Calcium, Ion: 1.26 mmol/L (ref 1.15–1.40)
HCT: 38 % (ref 36.0–46.0)
HCT: 40 % (ref 36.0–46.0)
Hemoglobin: 12.9 g/dL (ref 12.0–15.0)
Hemoglobin: 13.6 g/dL (ref 12.0–15.0)
O2 Saturation: 70 %
O2 Saturation: 70 %
Potassium: 2.8 mmol/L — ABNORMAL LOW (ref 3.5–5.1)
Potassium: 2.9 mmol/L — ABNORMAL LOW (ref 3.5–5.1)
Sodium: 145 mmol/L (ref 135–145)
Sodium: 146 mmol/L — ABNORMAL HIGH (ref 135–145)
TCO2: 27 mmol/L (ref 22–32)
TCO2: 27 mmol/L (ref 22–32)
pCO2, Ven: 45.4 mmHg (ref 44.0–60.0)
pCO2, Ven: 46.1 mmHg (ref 44.0–60.0)
pH, Ven: 7.352 (ref 7.250–7.430)
pH, Ven: 7.356 (ref 7.250–7.430)
pO2, Ven: 39 mmHg (ref 32.0–45.0)
pO2, Ven: 39 mmHg (ref 32.0–45.0)

## 2019-11-29 LAB — POCT I-STAT 7, (LYTES, BLD GAS, ICA,H+H)
Acid-base deficit: 2 mmol/L (ref 0.0–2.0)
Bicarbonate: 24.6 mmol/L (ref 20.0–28.0)
Calcium, Ion: 1.25 mmol/L (ref 1.15–1.40)
HCT: 38 % (ref 36.0–46.0)
Hemoglobin: 12.9 g/dL (ref 12.0–15.0)
O2 Saturation: 99 %
Potassium: 2.8 mmol/L — ABNORMAL LOW (ref 3.5–5.1)
Sodium: 140 mmol/L (ref 135–145)
TCO2: 26 mmol/L (ref 22–32)
pCO2 arterial: 46.2 mmHg (ref 32.0–48.0)
pH, Arterial: 7.334 — ABNORMAL LOW (ref 7.350–7.450)
pO2, Arterial: 127 mmHg — ABNORMAL HIGH (ref 83.0–108.0)

## 2019-11-29 SURGERY — RIGHT/LEFT HEART CATH AND CORONARY/GRAFT ANGIOGRAPHY
Anesthesia: LOCAL

## 2019-11-29 MED ORDER — SODIUM CHLORIDE 0.9% FLUSH: 3.0000 mL | Freq: Two times a day (BID) | INTRAVENOUS | Status: DC

## 2019-11-29 MED ORDER — MIDAZOLAM HCL 2 MG/2ML IJ SOLN
INTRAMUSCULAR | Status: DC | PRN
  Administered 2019-11-29: 1 mg via INTRAVENOUS

## 2019-11-29 MED ORDER — FENTANYL CITRATE (PF) 100 MCG/2ML IJ SOLN
INTRAMUSCULAR | Status: AC
  Filled 2019-11-29: qty 2

## 2019-11-29 MED ORDER — IOHEXOL 350 MG/ML SOLN
INTRAVENOUS | Status: DC | PRN
  Administered 2019-11-29: 50 mL

## 2019-11-29 MED ORDER — SODIUM CHLORIDE 0.9% FLUSH: 3.0000 mL | INTRAVENOUS | Status: DC | PRN

## 2019-11-29 MED ORDER — HEPARIN (PORCINE) IN NACL 1000-0.9 UT/500ML-% IV SOLN
INTRAVENOUS | Status: DC | PRN
  Administered 2019-11-29: 500 mL

## 2019-11-29 MED ORDER — VERAPAMIL HCL 2.5 MG/ML IV SOLN
INTRAVENOUS | Status: AC
  Filled 2019-11-29: qty 2

## 2019-11-29 MED ORDER — SODIUM CHLORIDE 0.9 % IV SOLN: 250.0000 mL | INTRAVENOUS | Status: DC | PRN

## 2019-11-29 MED ORDER — FENTANYL CITRATE (PF) 100 MCG/2ML IJ SOLN
INTRAMUSCULAR | Status: DC | PRN
  Administered 2019-11-29: 25 ug via INTRAVENOUS

## 2019-11-29 MED ORDER — ASPIRIN 81 MG PO CHEW: 81.0000 mg | CHEWABLE_TABLET | ORAL | Status: DC

## 2019-11-29 MED ORDER — LIDOCAINE HCL (PF) 1 % IJ SOLN
INTRAMUSCULAR | Status: DC | PRN
  Administered 2019-11-29 (×2): 2 mL

## 2019-11-29 MED ORDER — TORSEMIDE 20 MG PO TABS: 40.0000 mg | ORAL_TABLET | Freq: Every day | ORAL | 5 refills | Status: AC

## 2019-11-29 MED ORDER — HEPARIN SODIUM (PORCINE) 1000 UNIT/ML IJ SOLN
INTRAMUSCULAR | Status: DC | PRN
  Administered 2019-11-29: 4000 [IU] via INTRAVENOUS

## 2019-11-29 MED ORDER — HEPARIN SODIUM (PORCINE) 1000 UNIT/ML IJ SOLN
INTRAMUSCULAR | Status: AC
  Filled 2019-11-29: qty 1

## 2019-11-29 MED ORDER — VERAPAMIL HCL 2.5 MG/ML IV SOLN
INTRAVENOUS | Status: DC | PRN
  Administered 2019-11-29: 10 mL via INTRA_ARTERIAL

## 2019-11-29 MED ORDER — SODIUM CHLORIDE 0.9 % WEIGHT BASED INFUSION
1.0000 mL/kg/h | INTRAVENOUS | Status: AC
  Administered 2019-11-29: 09:00:00 1 mL/kg/h via INTRAVENOUS

## 2019-11-29 MED ORDER — LIDOCAINE HCL (PF) 1 % IJ SOLN
INTRAMUSCULAR | Status: AC
  Filled 2019-11-29: qty 30

## 2019-11-29 MED ORDER — MIDAZOLAM HCL 2 MG/2ML IJ SOLN
INTRAMUSCULAR | Status: AC
  Filled 2019-11-29: qty 2

## 2019-11-29 MED ORDER — HEPARIN (PORCINE) IN NACL 1000-0.9 UT/500ML-% IV SOLN
INTRAVENOUS | Status: AC
  Filled 2019-11-29: qty 1000

## 2019-11-29 SURGICAL SUPPLY — 19 items
CATH BALLN WEDGE 5F 110CM (CATHETERS) ×2 IMPLANT
CATH DXT MULTI JL4 JR4 ANG PIG (CATHETERS) IMPLANT
CATH EXPO 5F MPA-1 (CATHETERS) ×2 IMPLANT
CATH INFINITI 5 FR RCB (CATHETERS) ×2 IMPLANT
CATH INFINITI 5FR AL1 (CATHETERS) ×2 IMPLANT
CATH INFINITI 5FR JL4 (CATHETERS) ×2 IMPLANT
CATH INFINITI JR4 5F (CATHETERS) ×2 IMPLANT
DEVICE RAD COMP TR BAND LRG (VASCULAR PRODUCTS) ×2 IMPLANT
GLIDESHEATH SLEND SS 6F .021 (SHEATH) ×2 IMPLANT
GUIDEWIRE INQWIRE 1.5J.035X260 (WIRE) ×1 IMPLANT
INQWIRE 1.5J .035X260CM (WIRE) ×2
KIT HEART LEFT (KITS) ×2 IMPLANT
PACK CARDIAC CATHETERIZATION (CUSTOM PROCEDURE TRAY) ×2 IMPLANT
SHEATH GLIDE SLENDER 4/5FR (SHEATH) ×2 IMPLANT
SHEATH PINNACLE 5F 10CM (SHEATH) IMPLANT
SHEATH PINNACLE 7F 10CM (SHEATH) IMPLANT
TRANSDUCER W/STOPCOCK (MISCELLANEOUS) ×2 IMPLANT
TUBING CIL FLEX 10 FLL-RA (TUBING) ×2 IMPLANT
WIRE EMERALD 3MM-J .035X150CM (WIRE) IMPLANT

## 2019-11-29 NOTE — Progress Notes (Signed)
Report given to Kandis Cocking

## 2019-11-29 NOTE — Discharge Instructions (Signed)
Radial Site Care ° °This sheet gives you information about how to care for yourself after your procedure. Your health care provider may also give you more specific instructions. If you have problems or questions, contact your health care provider. °What can I expect after the procedure? °After the procedure, it is common to have: °· Bruising and tenderness at the catheter insertion area. °Follow these instructions at home: °Medicines °· Take over-the-counter and prescription medicines only as told by your health care provider. °Insertion site care °· Follow instructions from your health care provider about how to take care of your insertion site. Make sure you: °? Wash your hands with soap and water before you change your bandage (dressing). If soap and water are not available, use hand sanitizer. °? Change your dressing as told by your health care provider. °? Leave stitches (sutures), skin glue, or adhesive strips in place. These skin closures may need to stay in place for 2 weeks or longer. If adhesive strip edges start to loosen and curl up, you may trim the loose edges. Do not remove adhesive strips completely unless your health care provider tells you to do that. °· Check your insertion site every day for signs of infection. Check for: °? Redness, swelling, or pain. °? Fluid or blood. °? Pus or a bad smell. °? Warmth. °· Do not take baths, swim, or use a hot tub until your health care provider approves. °· You may shower 24-48 hours after the procedure, or as directed by your health care provider. °? Remove the dressing and gently wash the site with plain soap and water. °? Pat the area dry with a clean towel. °? Do not rub the site. That could cause bleeding. °· Do not apply powder or lotion to the site. °Activity ° °· For 24 hours after the procedure, or as directed by your health care provider: °? Do not flex or bend the affected arm. °? Do not push or pull heavy objects with the affected arm. °? Do not  drive yourself home from the hospital or clinic. You may drive 24 hours after the procedure unless your health care provider tells you not to. °? Do not operate machinery or power tools. °· Do not lift anything that is heavier than 10 lb (4.5 kg), or the limit that you are told, until your health care provider says that it is safe. °· Ask your health care provider when it is okay to: °? Return to work or school. °? Resume usual physical activities or sports. °? Resume sexual activity. °General instructions °· If the catheter site starts to bleed, raise your arm and put firm pressure on the site. If the bleeding does not stop, get help right away. This is a medical emergency. °· If you went home on the same day as your procedure, a responsible adult should be with you for the first 24 hours after you arrive home. °· Keep all follow-up visits as told by your health care provider. This is important. °Contact a health care provider if: °· You have a fever. °· You have redness, swelling, or yellow drainage around your insertion site. °Get help right away if: °· You have unusual pain at the radial site. °· The catheter insertion area swells very fast. °· The insertion area is bleeding, and the bleeding does not stop when you hold steady pressure on the area. °· Your arm or hand becomes pale, cool, tingly, or numb. °These symptoms may represent a serious problem   that is an emergency. Do not wait to see if the symptoms will go away. Get medical help right away. Call your local emergency services (911 in the [place] Do not drive yourself to the hospital. °Summary °· After the procedure, it is common to have bruising and tenderness at the site. °· Follow instructions from your health care provider about how to take care of your radial site wound. Check the wound every day for signs of infection. °· Do not lift anything that is heavier than 10 lb (4.5 kg), or the limit that you are told, until your health care provider says  that it is safe. °This information is not intended to replace advice given to you by your health care provider. Make sure you discuss any questions you have with your health care provider. °Document Released: 01/10/2011 Document Revised: 01/13/2018 Document Reviewed: 01/13/2018 °Elsevier Patient Education © 2020 [employer] ° °

## 2019-11-29 NOTE — Interval H&P Note (Signed)
History and Physical Interval Note:  11/29/2019 1:13 PM  [name] [name]  has presented today for surgery, with the diagnosis of Aortic stenosis.  The various methods of treatment have been discussed with the patient and family. After consideration of risks, benefits and other options for treatment, the patient has consented to  Procedure(s): RIGHT/LEFT HEART CATH AND CORONARY/GRAFT ANGIOGRAPHY (N/A) as a surgical intervention.  The patient's history has been reviewed, patient examined, no change in status, stable for surgery.  I have reviewed the patient's chart and labs.  Questions were answered to the patient's satisfaction.     Sherren Mocha

## 2019-11-30 ENCOUNTER — Encounter (HOSPITAL_COMMUNITY): Payer: Self-pay | Admitting: Cardiovascular Disease

## 2019-11-30 NOTE — Progress Notes (Signed)
  [facility]   MULTIDISCIPLINARY HEART VALVE TEAM  Pt underwent cardiac catheterization with Dr Burt Knack on 11/29/2019. Per Dr Burt Knack AS appears mild-moderate at most. Pulmonary and wedge pressures high.  He advised the pt to double torsemide and follow-up with Dr Clayborn Bigness for ongoing medical treatment. Dr Burt Knack did communicate with Dr Clayborn Bigness about plan for patient and we will plan to see the pt on a yearly basis for Echo and office visit with Dr Burt Knack in the [facility]. The pt will continue with Dr Clayborn Bigness for Cardiology follow-up.

## 2019-12-19 ENCOUNTER — Telehealth: Payer: Self-pay

## 2019-12-19 NOTE — Telephone Encounter (Signed)
NOTES ON FILE FROM Nino Parsley MD, SENT REFERRAL TO SCHEDULING

## 2020-02-10 ENCOUNTER — Ambulatory Visit: Payer: Self-pay | Admitting: Urology

## 2020-02-13 ENCOUNTER — Other Ambulatory Visit (HOSPITAL_COMMUNITY): Payer: Self-pay | Admitting: Cardiology

## 2020-02-13 NOTE — Telephone Encounter (Signed)
Pt has not been seen in our office since 2019 last seen by Dr.Cooper

## 2020-02-20 ENCOUNTER — Ambulatory Visit: Payer: Medicare HMO

## 2020-02-21 ENCOUNTER — Ambulatory Visit
Admission: RE | Admit: 2020-02-21 | Discharge: 2020-02-21 | Disposition: A | Discharge status: Home / Self Care | Payer: Medicare HMO | Source: Ambulatory Visit | Attending: Urology | Admitting: Urology

## 2020-02-21 ENCOUNTER — Other Ambulatory Visit: Payer: Self-pay

## 2020-02-21 DIAGNOSIS — N281 Cyst of kidney, acquired: Secondary | ICD-10-CM | POA: Diagnosis not present

## 2020-03-09 ENCOUNTER — Ambulatory Visit: Payer: Self-pay | Admitting: Urology

## 2020-04-06 ENCOUNTER — Ambulatory Visit (INDEPENDENT_AMBULATORY_CARE_PROVIDER_SITE_OTHER): Payer: Medicare HMO | Admitting: Urology

## 2020-04-06 ENCOUNTER — Other Ambulatory Visit: Payer: Self-pay

## 2020-04-06 VITALS — BP 151/87 | HR 88 | Ht 63.0 in | Wt 177.0 lb

## 2020-04-06 DIAGNOSIS — N281 Cyst of kidney, acquired: Secondary | ICD-10-CM | POA: Diagnosis not present

## 2020-04-06 NOTE — Progress Notes (Signed)
04/06/2020 1:22 PM   [name] [name] [birth year] [id number]  Referring provider: Kirk Ruths, MD [facility] [facility] [place],  [place] [address]  Chief Complaint  Patient presents with  . Results    HPI:  Follow-up - renal cyst-multiple cysts on her left kidney which was seen on noncontrast CT scan done for a 5 mm stone which she passed in 2018. The largest cyst on the left was 8 cm. Ithas mild septations. Follow-up ultrasound in 2019 and CT in 2019 were stable. Recent January 2020 ultrasound shows a 1.8 cm RUP cyst, 2.8 cm left lower pole cyst and 7.2 cm cyst in the mid to upper pole with stable small septation.   She has not had contrasted studies due to her renal function. She has h/o severe CAD status post CABG in February 2018 as well as diabetic nephropathy. Cr was up to 3, but Oct 2019 down to 1.59 and 1.74 Dec 2020.    She is voiding well. No gross hematuria. Cysts in left kidney a little bit bigger this year at 9.8 cm with previous measurement of about 8 cm on CT in 2019. MRI recommended. Cysts weren't completely characterized on MRI l spine in 2020. She had frequency with torsemide.    PMH: Past Medical History:  Diagnosis Date  . [name]    a. LHC 02/03/17 for unstable angina showed severe 3v CAD w/ sequential 70% & 80% p-mLAD, 99% mLCx, CTO mRCA, elevated LV filling pressure  . Colon polyp 2010  . Diabetes mellitus with complication ([place])   . Glaucoma   . Hyperlipidemia   . Hypertension     Surgical History: Past Surgical History:  Procedure Laterality Date  . APPENDECTOMY    . BREAST BIOPSY Right 1999  . COLONOSCOPY  2010  . COLONOSCOPY WITH PROPOFOL N/A 10/07/2018   Procedure: COLONOSCOPY WITH PROPOFOL;  Surgeon: Lollie Sails, MD;  Location: [facility] ENDOSCOPY;  Service: Endoscopy;  Laterality: N/A;  . CORONARY ANGIOPLASTY    . CORONARY ARTERY BYPASS GRAFT N/A 02/05/2017   Procedure: CORONARY ARTERY BYPASS  GRAFTING (CABG) x 4                                                                               LIMA-LAD SEQ SVG-DIAG-OM SVG-PD;  Surgeon: Gaye Pollack, MD;  Location: [place] OR;  Service: Open Heart Surgery;  Laterality: N/A;  . ESOPHAGOGASTRODUODENOSCOPY (EGD) WITH PROPOFOL N/A 10/07/2018   Procedure: ESOPHAGOGASTRODUODENOSCOPY (EGD) WITH PROPOFOL;  Surgeon: Lollie Sails, MD;  Location: [facility] ENDOSCOPY;  Service: Endoscopy;  Laterality: N/A;  . IABP INSERTION N/A 02/04/2017   Procedure: IABP Insertion;  Surgeon: Leonie Man, MD;  Location: [place] CV LAB;  Service: Cardiovascular;  Laterality: N/A;  . LEFT HEART CATH AND CORONARY ANGIOGRAPHY Left 02/03/2017   Procedure: Left Heart Cath and Coronary Angiography;  Surgeon: Nelva Bush, MD;  Location: [place] CV LAB;  Service: Cardiovascular;  Laterality: Left;  . MITRAL VALVE REPAIR N/A 02/05/2017   Procedure: MITRAL VALVE REPLACEMENT (MVR) WITH MAGNA MITRAL EASE PERICARDIAL BIOPROSTHESIS 25 MM;  Surgeon: Gaye Pollack, MD;  Location: [place];  Service:  Open Heart Surgery;  Laterality: N/A;  . PARATHYROIDECTOMY     1986  . RIGHT HEART CATH N/A 02/04/2017   Procedure: Right Heart Cath;  Surgeon: Leonie Man, MD;  Location: [place] [facility] LAB;  Service: Cardiovascular;  Laterality: N/A;  . RIGHT/LEFT HEART CATH AND CORONARY/GRAFT ANGIOGRAPHY N/A 11/29/2019   Procedure: RIGHT/LEFT HEART CATH AND CORONARY/GRAFT ANGIOGRAPHY;  Surgeon: Sherren Mocha, MD;  Location: [place] CV LAB;  Service: Cardiovascular;  Laterality: N/A;  . TEE WITHOUT CARDIOVERSION N/A 02/05/2017   Procedure: TRANSESOPHAGEAL ECHOCARDIOGRAM (TEE);  Surgeon: Gaye Pollack, MD;  Location: [place];  Service: Open Heart Surgery;  Laterality: N/A;    Home Medications:  Allergies as of 04/06/2020   No Known Allergies     Medication List       Accurate as of April 06, 2020  1:22 PM. If you have any questions, ask your nurse or doctor.        acetaminophen  500 MG tablet Commonly known as: TYLENOL Take 500 mg by mouth every 6 (six) hours as needed for mild pain.   amLODipine 2.5 MG tablet Commonly known as: NORVASC Take 1 tablet (2.5 mg total) by mouth daily.   aspirin EC 81 MG tablet Take 1 tablet (81 mg total) by mouth daily.   Betimol 0.5 % ophthalmic solution Generic drug: timolol Apply to eye.   Betimol 0.5 % ophthalmic solution Generic drug: timolol Apply to eye.   carvedilol 6.25 MG tablet Commonly known as: COREG Take 6.25 mg by mouth 2 (two) times daily.   carvedilol 12.5 MG tablet Commonly known as: COREG Take 12.5 mg by mouth 2 (two) times daily with a meal.   diclofenac sodium 1 % Gel Commonly known as: VOLTAREN Apply 1 application topically 4 (four) times daily as needed (pain).   dorzolamide-timolol 22.3-6.8 MG/ML ophthalmic solution Commonly known as: COSOPT Place 1 drop into both eyes 2 (two) times daily.   Entresto 24-26 MG Generic drug: sacubitril-valsartan Take 1 tablet by mouth 2 (two) times daily.   ferrous sulfate 325 (65 FE) MG tablet Take 325 mg by mouth daily with breakfast.   gabapentin 300 MG capsule Commonly known as: NEURONTIN Take 300 mg by mouth daily.   isosorbide mononitrate 30 MG 24 hr tablet Commonly known as: IMDUR Take 1 tablet by mouth once daily   latanoprost 0.005 % ophthalmic solution Commonly known as: XALATAN Place 1 drop into both eyes at bedtime.   Magnesium 250 MG Tabs Take 250 mg by mouth daily.   Potassium Chloride ER 20 MEQ Tbcr TAKE 1 TABLET BY MOUTH ONCE DAILY What changed: how much to take   rosuvastatin 40 MG tablet Commonly known as: CRESTOR Take 40 mg by mouth every evening.   torsemide 20 MG tablet Commonly known as: DEMADEX Take 2 tablets (40 mg total) by mouth daily.       Allergies: No Known Allergies  Family History: Family History  Problem Relation Age of Onset  . Diabetes Father   . Diabetes Sister   . Bladder Cancer Neg Hx   .  Kidney cancer Neg Hx   . Prostate cancer Neg Hx     Social History:  reports that she has never smoked. She has never used smokeless tobacco. She reports that she does not drink alcohol or use drugs.   Physical Exam: BP (!) 151/87   Pulse 88   Ht 5\' 3"  (1.6 m)   Wt 177 lb (80.3 kg)  BMI 31.35 kg/m   Constitutional:  Alert and oriented, No acute distress. HEENT: [place] AT, moist mucus membranes.  Trachea midline, no masses. Cardiovascular: No clubbing, cyanosis, or edema. Respiratory: Normal respiratory effort, no increased work of breathing. GI: Abdomen is soft, nontender, nondistended, no abdominal masses GU: No CVA tenderness Lymph: No cervical or inguinal lymphadenopathy. Skin: No rashes, bruises or suspicious lesions. Neurologic: Grossly intact, no focal deficits, moving all 4 extremities. Psychiatric: Normal mood and affect.  Laboratory Data: Lab Results  Component Value Date   WBC 4.8 11/23/2019   HGB 12.9 11/29/2019   HCT 38.0 11/29/2019   MCV 92 11/23/2019   PLT 159 11/23/2019    Lab Results  Component Value Date   CREATININE 1.74 (H) 11/23/2019    No results found for: PSA  No results found for: TESTOSTERONE  Lab Results  Component Value Date   HGBA1C 6.3 (H) 02/03/2017    Urinalysis    Component Value Date/Time   COLORURINE COLORLESS (A) 09/05/2018 1040   APPEARANCEUR Clear 02/25/2019 1018   LABSPEC 1.004 (L) 09/05/2018 1040   PHURINE 5.0 09/05/2018 1040   GLUCOSEU Negative 02/25/2019 1018   HGBUR NEGATIVE 09/05/2018 1040   BILIRUBINUR Negative 02/25/2019 1018   KETONESUR NEGATIVE 09/05/2018 1040   PROTEINUR Trace (A) 02/25/2019 1018   PROTEINUR NEGATIVE 09/05/2018 1040   NITRITE Positive (A) 02/25/2019 1018   NITRITE NEGATIVE 09/05/2018 1040   LEUKOCYTESUR Trace (A) 02/25/2019 1018    Lab Results  Component Value Date   LABMICR See below: 02/25/2019   WBCUA 5-10 (A) 02/25/2019   RBCUA None seen 02/25/2019   LABEPIT 0-10 02/25/2019    MUCUS Present (A) 12/04/2017   BACTERIA Many (A) 02/25/2019    Pertinent Imaging: [place] 2021 and 2020 images. MRI L spine 2020, CT 2019 - images  Results for orders placed during the hospital encounter of 12/23/17  Abdomen 1 view (KUB)   Narrative CLINICAL DATA:  Kidney stones.  EXAM: ABDOMEN - 1 VIEW  COMPARISON:  12/03/2017 CT abdomen pelvis.  FINDINGS: There may be a punctate stone or stones in the upper pole right kidney. Renal vascular calcifications on the left. No definite calcifications along the expected course of the ureters or bladder. Specifically, a distal left ureteral stone seen on 12/03/2017 is not readily appreciated. Stool is seen in the majority of the colon.  IMPRESSION: 1. Punctate right renal stones. 2. Distal left ureteral stone seen on 12/03/2017 is not readily appreciated.   Electronically Signed   By: Lorin Picket M.D.   On: 12/23/2017 12:02    No results found for this or any previous visit. No results found for this or any previous visit. No results found for this or any previous visit. Results for orders placed during the hospital encounter of 02/21/20  US RENAL   Narrative CLINICAL DATA:  Follow-up renal cyst.  EXAM: RENAL / URINARY TRACT ULTRASOUND COMPLETE  COMPARISON:  01/14/2019 \  FINDINGS: Right Kidney:  Renal measurements: 11 x 4.1 x 5.0 cm = volume: 117.9 ML. Echogenicity within normal limits. No hydronephrosis. Upper pole cyst appears anechoic with increased through transmission measuring 1.4 x 1.8 x 1.4 cm. Stone within the inferior pole measures 4.6 mm.  Left Kidney:  Renal measurements: 12.2 x 6.1 x 6.3 cm = volume: 245.7. Slight Leigh lobulated anechoic cyst arising from the upper pole of left kidney measures 9.8 x 3.9 x 8.0 cm. This contains a thin internal area of septation. On the previous exam this  measured 7.2 x 5.0 x 7.0 cm lower pole cyst measures 2.4 x 2.0 x 2.2 cm. No hydronephrosis or  mass.  Bladder:  Appears normal for degree of bladder distention.  Other:  None.  IMPRESSION: 1. Increase in size of mildly complex cyst within upper pole of left kidney. Given the large size of this lesion, this may be better assessed with contrast enhanced renal protocol MRI. 2. Nonobstructing right renal calculi.  .   Electronically Signed   By: Kerby Moors M.D.   On: 02/21/2020 16:34    No results found for this or any previous visit. No results found for this or any previous visit. No results found for this or any previous visit.  Assessment & Plan:    Renal cyst - complex and slightly larger - check non-con MRI abd next available and f/u with me in 3 months.   No follow-ups on file.  Festus Aloe, MD  [facility] [address] [place], [place] [address] [phone]

## 2020-05-16 ENCOUNTER — Other Ambulatory Visit: Payer: Self-pay

## 2020-05-16 ENCOUNTER — Ambulatory Visit
Admission: RE | Admit: 2020-05-16 | Discharge: 2020-05-16 | Disposition: A | Discharge status: Home / Self Care | Payer: Medicare HMO | Source: Ambulatory Visit | Attending: Urology | Admitting: Urology

## 2020-05-16 DIAGNOSIS — N281 Cyst of kidney, acquired: Secondary | ICD-10-CM | POA: Diagnosis not present

## 2020-06-16 ENCOUNTER — Other Ambulatory Visit: Payer: Self-pay

## 2020-06-16 DIAGNOSIS — I255 Ischemic cardiomyopathy: Secondary | ICD-10-CM | POA: Diagnosis present

## 2020-06-16 DIAGNOSIS — E876 Hypokalemia: Secondary | ICD-10-CM | POA: Diagnosis not present

## 2020-06-16 DIAGNOSIS — K862 Cyst of pancreas: Secondary | ICD-10-CM | POA: Diagnosis present

## 2020-06-16 DIAGNOSIS — I2510 Atherosclerotic heart disease of native coronary artery without angina pectoris: Secondary | ICD-10-CM | POA: Diagnosis present

## 2020-06-16 DIAGNOSIS — Z951 Presence of aortocoronary bypass graft: Secondary | ICD-10-CM

## 2020-06-16 DIAGNOSIS — N281 Cyst of kidney, acquired: Secondary | ICD-10-CM | POA: Diagnosis present

## 2020-06-16 DIAGNOSIS — I130 Hypertensive heart and chronic kidney disease with heart failure and stage 1 through stage 4 chronic kidney disease, or unspecified chronic kidney disease: Secondary | ICD-10-CM | POA: Diagnosis present

## 2020-06-16 DIAGNOSIS — Z20822 Contact with and (suspected) exposure to covid-19: Secondary | ICD-10-CM | POA: Diagnosis present

## 2020-06-16 DIAGNOSIS — I5022 Chronic systolic (congestive) heart failure: Secondary | ICD-10-CM | POA: Diagnosis present

## 2020-06-16 DIAGNOSIS — R778 Other specified abnormalities of plasma proteins: Secondary | ICD-10-CM | POA: Diagnosis present

## 2020-06-16 DIAGNOSIS — E785 Hyperlipidemia, unspecified: Secondary | ICD-10-CM | POA: Diagnosis present

## 2020-06-16 DIAGNOSIS — N179 Acute kidney failure, unspecified: Secondary | ICD-10-CM | POA: Diagnosis not present

## 2020-06-16 DIAGNOSIS — I480 Paroxysmal atrial fibrillation: Secondary | ICD-10-CM | POA: Diagnosis present

## 2020-06-16 DIAGNOSIS — H409 Unspecified glaucoma: Secondary | ICD-10-CM | POA: Diagnosis present

## 2020-06-16 DIAGNOSIS — Z833 Family history of diabetes mellitus: Secondary | ICD-10-CM

## 2020-06-16 DIAGNOSIS — Z953 Presence of xenogenic heart valve: Secondary | ICD-10-CM

## 2020-06-16 DIAGNOSIS — Z7982 Long term (current) use of aspirin: Secondary | ICD-10-CM

## 2020-06-16 DIAGNOSIS — E1140 Type 2 diabetes mellitus with diabetic neuropathy, unspecified: Secondary | ICD-10-CM | POA: Diagnosis present

## 2020-06-16 DIAGNOSIS — N3000 Acute cystitis without hematuria: Secondary | ICD-10-CM | POA: Diagnosis present

## 2020-06-16 DIAGNOSIS — E1122 Type 2 diabetes mellitus with diabetic chronic kidney disease: Secondary | ICD-10-CM | POA: Diagnosis present

## 2020-06-16 DIAGNOSIS — Z7902 Long term (current) use of antithrombotics/antiplatelets: Secondary | ICD-10-CM

## 2020-06-16 DIAGNOSIS — I350 Nonrheumatic aortic (valve) stenosis: Secondary | ICD-10-CM | POA: Diagnosis present

## 2020-06-16 DIAGNOSIS — N184 Chronic kidney disease, stage 4 (severe): Secondary | ICD-10-CM | POA: Diagnosis present

## 2020-06-16 LAB — BASIC METABOLIC PANEL
Anion gap: 12 (ref 5–15)
BUN: 34 mg/dL — ABNORMAL HIGH (ref 8–23)
CO2: 28 mmol/L (ref 22–32)
Calcium: 10.1 mg/dL (ref 8.9–10.3)
Chloride: 99 mmol/L (ref 98–111)
Creatinine, Ser: 3.22 mg/dL — ABNORMAL HIGH (ref 0.44–1.00)
GFR calc Af Amer: 15 mL/min — ABNORMAL LOW (ref >60)
GFR calc non Af Amer: 13 mL/min — ABNORMAL LOW (ref >60)
Glucose, Bld: 126 mg/dL — ABNORMAL HIGH (ref 70–99)
Potassium: 2.9 mmol/L — ABNORMAL LOW (ref 3.5–5.1)
Sodium: 139 mmol/L (ref 135–145)

## 2020-06-16 LAB — CBC
HCT: 45.5 % (ref 36.0–46.0)
Hemoglobin: 15.4 g/dL — ABNORMAL HIGH (ref 12.0–15.0)
MCH: 29.9 pg (ref 26.0–34.0)
MCHC: 33.8 g/dL (ref 30.0–36.0)
MCV: 88.3 fL (ref 80.0–100.0)
Platelets: 200 10*3/uL (ref 150–400)
RBC: 5.15 MIL/uL — ABNORMAL HIGH (ref 3.87–5.11)
RDW: 15.2 % (ref 11.5–15.5)
WBC: 7.5 10*3/uL (ref 4.0–10.5)
nRBC: 0 % (ref 0.0–0.2)

## 2020-06-16 NOTE — ED Triage Notes (Signed)
Patient states generalized weakness and hands shaking since Friday.

## 2020-06-17 ENCOUNTER — Emergency Department: Payer: Medicare HMO

## 2020-06-17 ENCOUNTER — Encounter: Payer: Self-pay | Admitting: Internal Medicine

## 2020-06-17 ENCOUNTER — Inpatient Hospital Stay
Admission: EM | Admit: 2020-06-17 | Discharge: 2020-06-20 | DRG: 683 | Disposition: A | Discharge status: Home Health | Payer: Medicare HMO | Attending: Internal Medicine | Admitting: Internal Medicine

## 2020-06-17 DIAGNOSIS — I10 Essential (primary) hypertension: Secondary | ICD-10-CM | POA: Diagnosis present

## 2020-06-17 DIAGNOSIS — R531 Weakness: Secondary | ICD-10-CM

## 2020-06-17 DIAGNOSIS — I350 Nonrheumatic aortic (valve) stenosis: Secondary | ICD-10-CM

## 2020-06-17 DIAGNOSIS — Z953 Presence of xenogenic heart valve: Secondary | ICD-10-CM

## 2020-06-17 DIAGNOSIS — N390 Urinary tract infection, site not specified: Secondary | ICD-10-CM

## 2020-06-17 DIAGNOSIS — I2510 Atherosclerotic heart disease of native coronary artery without angina pectoris: Secondary | ICD-10-CM | POA: Diagnosis present

## 2020-06-17 DIAGNOSIS — Z951 Presence of aortocoronary bypass graft: Secondary | ICD-10-CM

## 2020-06-17 DIAGNOSIS — N3000 Acute cystitis without hematuria: Secondary | ICD-10-CM

## 2020-06-17 DIAGNOSIS — E1140 Type 2 diabetes mellitus with diabetic neuropathy, unspecified: Secondary | ICD-10-CM | POA: Diagnosis present

## 2020-06-17 DIAGNOSIS — E876 Hypokalemia: Secondary | ICD-10-CM

## 2020-06-17 DIAGNOSIS — N179 Acute kidney failure, unspecified: Secondary | ICD-10-CM

## 2020-06-17 DIAGNOSIS — R778 Other specified abnormalities of plasma proteins: Secondary | ICD-10-CM | POA: Diagnosis not present

## 2020-06-17 DIAGNOSIS — I480 Paroxysmal atrial fibrillation: Secondary | ICD-10-CM | POA: Diagnosis present

## 2020-06-17 DIAGNOSIS — R935 Abnormal findings on diagnostic imaging of other abdominal regions, including retroperitoneum: Secondary | ICD-10-CM

## 2020-06-17 DIAGNOSIS — I5022 Chronic systolic (congestive) heart failure: Secondary | ICD-10-CM

## 2020-06-17 DIAGNOSIS — E118 Type 2 diabetes mellitus with unspecified complications: Secondary | ICD-10-CM | POA: Diagnosis present

## 2020-06-17 DIAGNOSIS — N189 Chronic kidney disease, unspecified: Secondary | ICD-10-CM

## 2020-06-17 LAB — GLUCOSE, CAPILLARY
Glucose-Capillary: 116 mg/dL — ABNORMAL HIGH (ref 70–99)
Glucose-Capillary: 175 mg/dL — ABNORMAL HIGH (ref 70–99)
Glucose-Capillary: 170 mg/dL — ABNORMAL HIGH (ref 70–99)

## 2020-06-17 LAB — HEMOGLOBIN A1C
Hgb A1c MFr Bld: 7.3 % — ABNORMAL HIGH (ref 4.8–5.6)
Mean Plasma Glucose: 162.81 mg/dL

## 2020-06-17 LAB — SARS CORONAVIRUS 2 BY RT PCR (HOSPITAL ORDER, PERFORMED IN ~~LOC~~ HOSPITAL LAB): SARS Coronavirus 2: NEGATIVE

## 2020-06-17 LAB — URINALYSIS, COMPLETE (UACMP) WITH MICROSCOPIC
Bilirubin Urine: NEGATIVE
Glucose, UA: NEGATIVE mg/dL
Ketones, ur: NEGATIVE mg/dL
Nitrite: NEGATIVE
Protein, ur: 100 mg/dL — AB
RBC / HPF: >50 RBC/hpf — ABNORMAL HIGH (ref 0–5)
Specific Gravity, Urine: 1.017 (ref 1.005–1.030)
WBC, UA: >50 WBC/hpf — ABNORMAL HIGH (ref 0–5)
pH: 5 (ref 5.0–8.0)

## 2020-06-17 LAB — POTASSIUM: Potassium: 3 mmol/L — ABNORMAL LOW (ref 3.5–5.1)

## 2020-06-17 LAB — BASIC METABOLIC PANEL
Anion gap: 13 (ref 5–15)
BUN: 34 mg/dL — ABNORMAL HIGH (ref 8–23)
CO2: 28 mmol/L (ref 22–32)
Calcium: 9.6 mg/dL (ref 8.9–10.3)
Chloride: 100 mmol/L (ref 98–111)
Creatinine, Ser: 2.98 mg/dL — ABNORMAL HIGH (ref 0.44–1.00)
GFR calc Af Amer: 16 mL/min — ABNORMAL LOW (ref >60)
GFR calc non Af Amer: 14 mL/min — ABNORMAL LOW (ref >60)
Glucose, Bld: 125 mg/dL — ABNORMAL HIGH (ref 70–99)
Potassium: 3.4 mmol/L — ABNORMAL LOW (ref 3.5–5.1)
Sodium: 141 mmol/L (ref 135–145)

## 2020-06-17 LAB — HEPATIC FUNCTION PANEL
ALT: 17 U/L (ref 0–44)
AST: 24 U/L (ref 15–41)
Albumin: 3.2 g/dL — ABNORMAL LOW (ref 3.5–5.0)
Alkaline Phosphatase: 63 U/L (ref 38–126)
Bilirubin, Direct: 0.4 mg/dL — ABNORMAL HIGH (ref 0.0–0.2)
Indirect Bilirubin: 2 mg/dL — ABNORMAL HIGH (ref 0.3–0.9)
Total Bilirubin: 2.4 mg/dL — ABNORMAL HIGH (ref 0.3–1.2)
Total Protein: 5.8 g/dL — ABNORMAL LOW (ref 6.5–8.1)

## 2020-06-17 LAB — TROPONIN I (HIGH SENSITIVITY)
Troponin I (High Sensitivity): 34 ng/L — ABNORMAL HIGH (ref <18)
Troponin I (High Sensitivity): 31 ng/L — ABNORMAL HIGH (ref <18)

## 2020-06-17 LAB — BRAIN NATRIURETIC PEPTIDE: B Natriuretic Peptide: 1595.4 pg/mL — ABNORMAL HIGH (ref 0.0–100.0)

## 2020-06-17 LAB — MAGNESIUM: Magnesium: 3.5 mg/dL — ABNORMAL HIGH (ref 1.7–2.4)

## 2020-06-17 MED ORDER — POTASSIUM CHLORIDE 10 MEQ/100ML IV SOLN
10.0000 meq | INTRAVENOUS | Status: AC
  Administered 2020-06-17 (×2): 10 meq via INTRAVENOUS
  Filled 2020-06-17 (×2): qty 100

## 2020-06-17 MED ORDER — INSULIN ASPART 100 UNIT/ML ~~LOC~~ SOLN
0.0000 [IU] | Freq: Three times a day (TID) | SUBCUTANEOUS | Status: DC
  Administered 2020-06-17: 2 [IU] via SUBCUTANEOUS
  Administered 2020-06-17: 3 [IU] via SUBCUTANEOUS
  Administered 2020-06-18: 2 [IU] via SUBCUTANEOUS
  Administered 2020-06-19: 3 [IU] via SUBCUTANEOUS
  Filled 2020-06-17 (×4): qty 1

## 2020-06-17 MED ORDER — POTASSIUM CHLORIDE CRYS ER 20 MEQ PO TBCR: 40.0000 meq | EXTENDED_RELEASE_TABLET | Freq: Once | ORAL | Status: DC

## 2020-06-17 MED ORDER — POTASSIUM CHLORIDE 10 MEQ/100ML IV SOLN
10.0000 meq | Freq: Once | INTRAVENOUS | Status: AC
  Administered 2020-06-17: 10 meq via INTRAVENOUS
  Filled 2020-06-17: qty 100

## 2020-06-17 MED ORDER — ACETAMINOPHEN 650 MG RE SUPP: 650.0000 mg | Freq: Four times a day (QID) | RECTAL | Status: DC | PRN

## 2020-06-17 MED ORDER — INSULIN ASPART 100 UNIT/ML ~~LOC~~ SOLN: 0.0000 [IU] | Freq: Every day | SUBCUTANEOUS | Status: DC

## 2020-06-17 MED ORDER — HEPARIN SODIUM (PORCINE) 5000 UNIT/ML IJ SOLN
5000.0000 [IU] | Freq: Three times a day (TID) | INTRAMUSCULAR | Status: DC
  Administered 2020-06-17 – 2020-06-20 (×10): 5000 [IU] via SUBCUTANEOUS
  Filled 2020-06-17 (×10): qty 1

## 2020-06-17 MED ORDER — SODIUM CHLORIDE 0.9 % IV SOLN: INTRAVENOUS | Status: AC

## 2020-06-17 MED ORDER — ONDANSETRON HCL 4 MG PO TABS: 4.0000 mg | ORAL_TABLET | Freq: Four times a day (QID) | ORAL | Status: DC | PRN

## 2020-06-17 MED ORDER — ACETAMINOPHEN 325 MG PO TABS: 650.0000 mg | ORAL_TABLET | Freq: Four times a day (QID) | ORAL | Status: DC | PRN

## 2020-06-17 MED ORDER — ONDANSETRON HCL 4 MG/2ML IJ SOLN: 4.0000 mg | Freq: Four times a day (QID) | INTRAMUSCULAR | Status: DC | PRN

## 2020-06-17 MED ORDER — POTASSIUM CHLORIDE CRYS ER 20 MEQ PO TBCR
20.0000 meq | EXTENDED_RELEASE_TABLET | Freq: Once | ORAL | Status: AC
  Administered 2020-06-17: 20 meq via ORAL
  Filled 2020-06-17: qty 1

## 2020-06-17 MED ORDER — SODIUM CHLORIDE 0.9 % IV SOLN
1.0000 g | INTRAVENOUS | Status: DC
  Administered 2020-06-17 – 2020-06-19 (×3): 1 g via INTRAVENOUS
  Filled 2020-06-17 (×2): qty 1
  Filled 2020-06-17 (×2): qty 10

## 2020-06-17 MED ORDER — SODIUM CHLORIDE 0.9 % IV SOLN
1.0000 g | Freq: Once | INTRAVENOUS | Status: AC
  Administered 2020-06-17: 1 g via INTRAVENOUS
  Filled 2020-06-17: qty 10

## 2020-06-17 NOTE — ED Notes (Signed)
Pt provided bath wipes to clean self at this time. Denies any other needs. Denies pain.

## 2020-06-17 NOTE — Progress Notes (Signed)
Patient ID: [name] [name], female   DOB: Oct 21, 1935, 85 y.o.   MRN: [id number] This is a no charge progress note H&P reviewed [name] [name] is a 85 y.o. female with medical history significant for CAD status post CABG, mild to moderate aortic stenosis, bioprosthetic mitral valve replacement, systolic CHF secondary to ischemic cardiomyopathy, EF 25%, CKD 4, paroxysmal A. fib not on anticoagulation, HTN and diabetes who presents with a 2-day history of increasing fatigue resulting in her sleeping a lot staying in bed and having difficulty ambulating, using her cane more than usual.  A/p:85 year old female with extensive cardiac history presenting with generalized weakness with work-up significant for UTI and acute kidney injury, hypokalemia and mildly elevated troponin of 34  -continue iv abx Replace lytes PT Ck echo

## 2020-06-17 NOTE — H&P (Signed)
History and Physical    [name] [name] [name] [name]:[id number] DOB: 1935/08/05 DOA: 06/17/2020  PCP: Kirk Ruths, MD   Patient coming from: home  I have personally briefly reviewed patient's old medical records in [place]  Chief Complaint: Weakness x2 days  HPI: [name] [name] is a 85 y.o. female with medical history significant for CAD status post CABG, mild to moderate aortic stenosis, bioprosthetic mitral valve replacement, systolic CHF secondary to ischemic cardiomyopathy, EF 25%, CKD 4, paroxysmal A. fib not on anticoagulation, HTN and diabetes who presents with a 2-day history of increasing fatigue resulting in her sleeping a lot staying in bed and having difficulty ambulating, using her cane more than usual.  She denies one-sided weakness numbness tingling or slurred speech.  She denied chest pains, shortness of breath, lower extremity pain or swelling.  Has had no cough fever or chills.  Denies abdominal pain, nausea vomiting or change in bowel habits.   ED Course: On arrival in the ER she had stable vitals.  EKG showed atrial fibrillation with controlled rate of 83 and nonspecific ST-T wave changes.  Troponin slightly elevated at 34.  BNP 1595 other blood work significant for creatinine of 3.22 above baseline of 1.74, with potassium of 2.9.  Urinalysis strongly consistent with UTI.  She had an abdominal CT that showed findings consistent with cystitis, and also showed stable right renal cysts and pancreatic cyst.  Chest x-ray showed no acute disease.  CT head no acute disease.  Patient started on IV Rocephin.  Hospitalist consulted for admission.  Review of Systems: As per HPI otherwise all other systems on review of systems negative.    Past Medical History:  Diagnosis Date  . [name]    a. LHC 02/03/17 for unstable angina showed severe 3v CAD w/ sequential 70% & 80% p-mLAD, 99% mLCx, CTO mRCA, elevated LV filling pressure  . Colon polyp 2010  . Diabetes  mellitus with complication ([place])   . Glaucoma   . Hyperlipidemia   . Hypertension     Past Surgical History:  Procedure Laterality Date  . APPENDECTOMY    . BREAST BIOPSY Right 1999  . COLONOSCOPY  2010  . COLONOSCOPY WITH PROPOFOL N/A 10/07/2018   Procedure: COLONOSCOPY WITH PROPOFOL;  Surgeon: Lollie Sails, MD;  Location: [facility] ENDOSCOPY;  Service: Endoscopy;  Laterality: N/A;  . CORONARY ANGIOPLASTY    . CORONARY ARTERY BYPASS GRAFT N/A 02/05/2017   Procedure: CORONARY ARTERY BYPASS GRAFTING (CABG) x 4                                                                               LIMA-LAD SEQ SVG-DIAG-OM SVG-PD;  Surgeon: Gaye Pollack, MD;  Location: [place] OR;  Service: Open Heart Surgery;  Laterality: N/A;  . ESOPHAGOGASTRODUODENOSCOPY (EGD) WITH PROPOFOL N/A 10/07/2018   Procedure: ESOPHAGOGASTRODUODENOSCOPY (EGD) WITH PROPOFOL;  Surgeon: Lollie Sails, MD;  Location: [facility] ENDOSCOPY;  Service: Endoscopy;  Laterality: N/A;  . IABP INSERTION N/A 02/04/2017   Procedure: IABP Insertion;  Surgeon: Leonie Man, MD;  Location: [place] CV LAB;  Service: Cardiovascular;  Laterality: N/A;  . LEFT HEART CATH AND CORONARY ANGIOGRAPHY Left  02/03/2017   Procedure: Left Heart Cath and Coronary Angiography;  Surgeon: Nelva Bush, MD;  Location: [place] CV LAB;  Service: Cardiovascular;  Laterality: Left;  . MITRAL VALVE REPAIR N/A 02/05/2017   Procedure: MITRAL VALVE REPLACEMENT (MVR) WITH MAGNA MITRAL EASE PERICARDIAL BIOPROSTHESIS 25 MM;  Surgeon: Gaye Pollack, MD;  Location: [place] OR;  Service: Open Heart Surgery;  Laterality: N/A;  . PARATHYROIDECTOMY     1986  . RIGHT HEART CATH N/A 02/04/2017   Procedure: Right Heart Cath;  Surgeon: Leonie Man, MD;  Location: [place] CV LAB;  Service: Cardiovascular;  Laterality: N/A;  . RIGHT/LEFT HEART CATH AND CORONARY/GRAFT ANGIOGRAPHY N/A 11/29/2019   Procedure: RIGHT/LEFT HEART CATH AND CORONARY/GRAFT ANGIOGRAPHY;  Surgeon:  Sherren Mocha, MD;  Location: [place] CV LAB;  Service: Cardiovascular;  Laterality: N/A;  . TEE WITHOUT CARDIOVERSION N/A 02/05/2017   Procedure: TRANSESOPHAGEAL ECHOCARDIOGRAM (TEE);  Surgeon: Gaye Pollack, MD;  Location: [place];  Service: Open Heart Surgery;  Laterality: N/A;     reports that she has never smoked. She has never used smokeless tobacco. She reports that she does not drink alcohol and does not use drugs.  No Known Allergies  Family History  Problem Relation Age of Onset  . Diabetes Father   . Diabetes Sister   . Bladder Cancer Neg Hx   . Kidney cancer Neg Hx   . Prostate cancer Neg Hx       Prior to Admission medications   Medication Sig Start Date End Date Taking? Authorizing Provider  acetaminophen (TYLENOL) 500 MG tablet Take 500 mg by mouth every 6 (six) hours as needed for mild pain.     [provider]  amLODipine (NORVASC) 2.5 MG tablet Take 1 tablet (2.5 mg total) by mouth daily. 11/28/19   Sherren Mocha, MD  aspirin EC 81 MG tablet Take 1 tablet (81 mg total) by mouth daily. 10/21/17   Bensimhon, Shaune Pascal, MD  carvedilol (COREG) 12.5 MG tablet Take 12.5 mg by mouth 2 (two) times daily with a meal.    [provider]  carvedilol (COREG) 6.25 MG tablet Take 6.25 mg by mouth 2 (two) times daily.    [provider]  diclofenac sodium (VOLTAREN) 1 % GEL Apply 1 application topically 4 (four) times daily as needed (pain).     [provider]  dorzolamide-timolol (COSOPT) 22.3-6.8 MG/ML ophthalmic solution Place 1 drop into both eyes 2 (two) times daily.  07/20/19   [provider]  ENTRESTO 24-26 MG Take 1 tablet by mouth 2 (two) times daily. 09/09/19   [provider]  ferrous sulfate 325 (65 FE) MG tablet Take 325 mg by mouth daily with breakfast.    [provider]  gabapentin (NEURONTIN) 300 MG capsule Take 300 mg by mouth daily.     [provider]  isosorbide mononitrate (IMDUR) 30 MG  24 hr tablet Take 1 tablet by mouth once daily 02/13/20   Sherren Mocha, MD  latanoprost (XALATAN) 0.005 % ophthalmic solution Place 1 drop into both eyes at bedtime.     [provider]  Magnesium 250 MG TABS Take 250 mg by mouth daily.    [provider]  Potassium Chloride ER 20 MEQ TBCR TAKE 1 TABLET BY MOUTH ONCE DAILY Patient taking differently: Take 20 mEq by mouth daily.  09/16/18   Bensimhon, Shaune Pascal, MD  rosuvastatin (CRESTOR) 40 MG tablet Take 40 mg by mouth every evening.  [provider]  timolol (BETIMOL) 0.5 % ophthalmic solution Apply to eye.    [provider]  timolol (BETIMOL) 0.5 % ophthalmic solution Apply to eye.    [provider]  torsemide (DEMADEX) 20 MG tablet Take 2 tablets (40 mg total) by mouth daily. 11/29/19   Sherren Mocha, MD    Physical Exam: Vitals:   06/16/20 2100 06/16/20 2101 06/17/20 0157  BP:  118/82 126/90  Pulse:  99 80  Resp:  19 18  Temp:  98 F (36.7 C)   TempSrc:  Oral   SpO2:  97% 97%  Weight: 77.1 kg    Height: 5\' 3"  (1.6 m)       Vitals:   06/16/20 2100 06/16/20 2101 06/17/20 0157  BP:  118/82 126/90  Pulse:  99 80  Resp:  19 18  Temp:  98 F (36.7 C)   TempSrc:  Oral   SpO2:  97% 97%  Weight: 77.1 kg    Height: 5\' 3"  (1.6 m)        Constitutional: Alert and oriented x 3 . Not in any apparent distress HEENT:      Head: Normocephalic and atraumatic.         Eyes: PERLA, EOMI, Conjunctivae are normal. Sclera is non-icteric.       Mouth/Throat: Mucous membranes are moist.       Neck: Supple with no signs of meningismus. Cardiovascular:  Irregular. 2/6 murmur, gallops, or rubs. 2+ symmetrical distal pulses are present . No JVD. No LE edema Respiratory: Respiratory effort normal .Lungs sounds clear bilaterally. No wheezes, crackles, or rhonchi.  Gastrointestinal: Soft, non tender, and non distended with positive bowel sounds. No rebound or guarding. Genitourinary: No CVA  tenderness. Musculoskeletal: Nontender with normal range of motion in all extremities. No cyanosis, or erythema of extremities. Neurologic: Normal speech and language. Face is symmetric. Moving all extremities. No gross focal neurologic deficits . Skin: Skin is warm, dry.  No rash or ulcers Psychiatric: Mood and affect are normal Speech and behavior are normal   Labs on Admission: I have personally reviewed following labs and imaging studies  CBC: Recent Labs  Lab 06/16/20 2109  WBC 7.5  HGB 15.4*  HCT 45.5  MCV 88.3  PLT 242   Basic Metabolic Panel: Recent Labs  Lab 06/16/20 2109  NA 139  K 2.9*  CL 99  CO2 28  GLUCOSE 126*  BUN 34*  CREATININE 3.22*  CALCIUM 10.1  MG 3.5*   GFR: Estimated Creatinine Clearance: 13.2 mL/min (A) (by C-G formula based on SCr of 3.22 mg/dL (H)). Liver Function Tests: Recent Labs  Lab 06/16/20 2109  AST 24  ALT 17  ALKPHOS 63  BILITOT 2.4*  PROT 5.8*  ALBUMIN 3.2*   No results for input(s): LIPASE, AMYLASE in the last 168 hours. No results for input(s): AMMONIA in the last 168 hours. Coagulation Profile: No results for input(s): INR, PROTIME in the last 168 hours. Cardiac Enzymes: No results for input(s): CKTOTAL, CKMB, CKMBINDEX, TROPONINI in the last 168 hours. BNP (last 3 results) No results for input(s): PROBNP in the last 8760 hours. HbA1C: No results for input(s): HGBA1C in the last 72 hours. CBG: No results for input(s): GLUCAP in the last 168 hours. Lipid Profile: No results for input(s): CHOL, HDL, LDLCALC, TRIG, CHOLHDL, LDLDIRECT in the last 72 hours. Thyroid Function Tests: No results for input(s): TSH, T4TOTAL, FREET4, T3FREE, THYROIDAB in the last 72 hours. Anemia Panel: No results for  input(s): VITAMINB12, FOLATE, FERRITIN, TIBC, IRON, RETICCTPCT in the last 72 hours. Urine analysis:    Component Value Date/Time   COLORURINE AMBER (A) 06/17/2020 0156   APPEARANCEUR CLOUDY (A) 06/17/2020 0156    APPEARANCEUR Clear 02/25/2019 1018   LABSPEC 1.017 06/17/2020 0156   PHURINE 5.0 06/17/2020 0156   GLUCOSEU NEGATIVE 06/17/2020 0156   HGBUR LARGE (A) 06/17/2020 0156   BILIRUBINUR NEGATIVE 06/17/2020 0156   BILIRUBINUR Negative 02/25/2019 [place] 06/17/2020 0156   PROTEINUR 100 (A) 06/17/2020 0156   NITRITE NEGATIVE 06/17/2020 0156   LEUKOCYTESUR LARGE (A) 06/17/2020 0156    Radiological Exams on Admission: CT Head Wo Contrast  Result Date: 06/17/2020 CLINICAL DATA:  Headache EXAM: CT HEAD WITHOUT CONTRAST TECHNIQUE: Contiguous axial images were obtained from the base of the skull through the vertex without intravenous contrast. COMPARISON:  09/05/2018 FINDINGS: Brain: There is no mass, hemorrhage or extra-axial collection. The size and configuration of the ventricles and extra-axial CSF spaces are normal. There is hypoattenuation of the white matter, most commonly indicating chronic small vessel disease. Remote left cerebellar small vessel infarct. Vascular: No abnormal hyperdensity of the major intracranial arteries or dural venous sinuses. No intracranial atherosclerosis. Skull: The visualized skull base, calvarium and extracranial soft tissues are normal. Sinuses/Orbits: No fluid levels or advanced mucosal thickening of the visualized paranasal sinuses. No mastoid or middle ear effusion. The orbits are normal. IMPRESSION: 1. No acute intracranial abnormality. 2. Chronic small vessel disease and remote left cerebellar small vessel infarct. Electronically Signed   By: Ulyses Jarred M.D.   On: 06/17/2020 02:37   DG Chest Portable 1 View  Result Date: 06/17/2020 CLINICAL DATA:  Generalized weakness, short of breath EXAM: PORTABLE CHEST 1 VIEW COMPARISON:  12/23/2018 FINDINGS: Single frontal view of the chest demonstrates stable postsurgical changes from median sternotomy and mitral valve replacement. Cardiac silhouette remains enlarged. Stable ectasia of the thoracic aorta. No  airspace disease, effusion, or pneumothorax. No acute bony abnormalities. IMPRESSION: 1. Stable cardiomegaly and thoracic aortic ectasia. 2. No acute intrathoracic process. Electronically Signed   By: Randa Ngo M.D.   On: 06/17/2020 02:31   CT Renal Stone Study  Result Date: 06/17/2020 CLINICAL DATA:  Hematuria, unknown cause, generalized weakness and hand shaking since Friday EXAM: CT ABDOMEN AND PELVIS WITHOUT CONTRAST TECHNIQUE: Multidetector CT imaging of the abdomen and pelvis was performed following the standard protocol without IV contrast. COMPARISON:  CT 07/22/2015, MRI 05/16/2020 renal ultrasound 02/21/2020 FINDINGS: Lower chest: Coarse reticular changes present in the lung bases with an area of bandlike opacity in left lung base likely reflecting subsegmental atelectasis or scarring. No focal consolidative opacity or effusion. Cardiomegaly is present extensive coronary artery calcifications. Evidence of prior mitral valve replacement. Dense calcifications on the aortic leaflets. Distal thoracic aortic calcification noted as well. Hepatobiliary: No visible focal liver lesions. Punctate calcifications seen in the left lobe liver, stable from prior, may reflect sequela of prior granulomatous disease. Dependently layering calcifications noted in the gallbladder. No pericholecystic fluid or inflammation. No biliary ductal dilatation or calcified intraductal gallstones. Pancreas: Small cystic focus in the head of the pancreas measuring 6 mm (2/32). Additional cystic focus towards the tail of the pancreas measuring 7 mm similar in appearance to comparison CT (2/26). No inflammation or ductal dilatation. Spleen: Normal in size without focal abnormality. Adrenals/Urinary Tract: No concerning adrenal lesions. Bilateral areas of cortical scarring. Large complex multi septate cystic structure in the left kidney measuring up to 7 cm by  3.9 cm by 6.9 cm unchanged in comparison to prior exams. Additional fluid  attenuation cysts present in both kidneys. A smaller hyperdense cyst is seen in the lower pole right kidney, unchanged from comparison some likely benign hyperdense renal cyst. No obstructive urolithiasis. Multiple nonobstructing calculi present in the lower pole right kidney. Bladder decompressed circumferential bladder wall thickening and edematous changes of the bladder wall on a background of more chronic intramural fatty infiltration Stomach/Bowel: Small sliding-type hiatal hernia. Gastric wall thickening likely related to underdistention. Duodenum is unremarkable. No small bowel thickening or dilatation. The appendix is surgically absent. No colonic dilatation or wall thickening. Scattered colonic diverticula without focal inflammation to suggest diverticulitis. Vascular/Lymphatic: Atherosclerotic calcifications throughout the abdominal aorta and branch vessels. No aneurysm or ectasia. No enlarged abdominopelvic lymph nodes. Reproductive: Heterogeneous, lobular uterus compatible with uterine fibroids, in similar configuration to the comparison study. No new concerning adnexal lesions. Other: Mild body wall edema. No abdominopelvic free air or fluid. No bowel containing hernia. Small fat containing umbilical hernia. Musculoskeletal: Multilevel degenerative changes are present in the imaged portions of the spine. Additional degenerative changes in the hips and pelvis. The osseous structures appear diffusely demineralized which may limit detection of small or nondisplaced fractures. No acute osseous abnormality or suspicious osseous lesion. IMPRESSION: 1. Bladder decompressed but with with circumferential bladder wall thickening greater than expected for underdistention and edematous changes of the bladder wall on a background of more chronic intramural fatty infiltration. Correlate with urinalysis to exclude cystitis. 2. 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 Probable hyperdense cyst in the lower pole right kidney as well. 3. Nonobstructing right nephrolithiasis. No obstructive urolithiasis or hydronephrosis. 4. Stable appearance of a 7 mm cystic focus in the tail the pancreas but with new 7 mm cystic structure seen at the pancreatic head. Consider follow-up pancreatic protocol MRI/MRCP in 1 year which would align with patient's follow-up for the lesion in the tail. This recommendation follows ACR consensus guidelines: Management of Incidental Pancreatic Cysts: A White Paper of the ACR Incidental Findings Committee. [place] [id number]. 5. Cholelithiasis without evidence of acute cholecystitis. 6. Colonic diverticulosis without evidence of diverticulitis. 7. Fibroid uterus. 8. Aortic Atherosclerosis (ICD10-I70.0). Electronically Signed   By: Lovena Le M.D.   On: 06/17/2020 02:41    EKG: Independently reviewed. Interpretation : atrial fibrillation with controlled rate of 83 and nonspecific ST-T wave changes.  Assessment/Plan  85 year old female with extensive cardiac history presenting with generalized weakness with work-up significant for UTI and acute kidney injury, hypokalemia and mildly elevated troponin of 34  Principal Problem:   Generalized weakness -Suspect multifactorial related to secondary to UTI, AKI superimposed on her multiple chronic medical conditions -Treat etiologies as outlined below -Physical therapy evaluation    UTI (urinary tract infection) -Urinalysis consistent with UTI and abdominal CT showing cystitis -Continue IV Rocephin -Follow cultures    Hypokalemia -Potassium supplementation, IV n.p.o. -Possibly related to home diuretics -Continue to monitor    Acute kidney injury superimposed on CKD stage IV(HCC) -Creatinine elevated at 3.22 above baseline of 1.74 -Likely prerenal related to diuretic use -IV hydration but with close  monitoring for fluid overload in view of elevated BNP of 1595  Elevated troponin   [name] with history of CABG x4 -No complaints of chest pain.  Troponin 34.  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  Chest x-ray showed no acute disease -Monitor for decompensation in view of IV hydration being given for acute kidney injury    Aortic stenosis, mild to moderate -Seen by cardiologist 05/14/2020.  Not currently a candidate for TAVR as not severe enough    History of mitral valve replacement with bioprosthetic valve -No acute concerns  Renal cyst on CT Pancreatic cyst on CT -Chronic stable findings "with recommendations for follow-up study        DVT prophylaxis: Heparin Code Status: full code  Family Communication:  none  Disposition Plan: Back to previous home environment Consults called: none  Status:obs    Athena Masse MD [facility]     06/17/2020, 3:24 AM

## 2020-06-17 NOTE — ED Provider Notes (Signed)
[facility] Emergency Department Provider Note  ____________________________________________   First MD Initiated Contact with Patient 06/17/20 0118     (approximate)  I have reviewed the triage vital signs and the nursing notes.   HISTORY  Chief Complaint Fatigue    HPI [name] [name] is a 85 y.o. female with multivessel coronary disease, NSTEMI status post CABG with bioprosthetic mitral valve replacement, chronic systolic CHF, aortic stenosis, ischemic cardiomyopathy with EF of 25%, CKD, paroxysmal A. fib, hypertension, hyperlipidemia, diabetes who comes in with fatigue. Patient had 2 days of increasing fatigue. According to the daughter she has been more sleepy and having more difficulty with ambulation. She normally walks normally but is need to use a cane due to her weakness. She is also had some bilateral hand tremors that have made it more difficult for her to hold things and says she has been dropping things. Denies weakness on one side of her body or slurred speech.  Patient lives by herself.  Patient was seen by cardiology on 5/24 and patient was not a candidate for TAVR at this time due to not severe enough stenosis we have been managing conservatively. 5/6 Cr 3.6. Total bili 2.6           Past Medical History:  Diagnosis Date  . [name]    a. LHC 02/03/17 for unstable angina showed severe 3v CAD w/ sequential 70% & 80% p-mLAD, 99% mLCx, CTO mRCA, elevated LV filling pressure  . Colon polyp 2010  . Diabetes mellitus with complication ([place])   . Glaucoma   . Hyperlipidemia   . Hypertension     Patient Active Problem List   Diagnosis Date Noted  . Pain in limb 06/28/2019  . Chronic superficial gastritis without bleeding 01/06/2019  . Use of cane as ambulatory aid 11/11/2018  . Falls frequently 10/21/2018  . Acute on chronic systolic heart failure ([place]) 02/17/2018  . Hypercalcemia 06/28/2017  . PAF (paroxysmal atrial  fibrillation) ([place]) 03/26/2017  . CKD (chronic kidney disease), stage IV ([place]) 03/26/2017  . [name]   . Bilateral carpal tunnel syndrome 02/06/2017  . [name] 02/05/2017  . Unstable angina ([place]) 02/03/2017  . [name] 02/03/2017  . Murmur 02/03/2017  . Type 2 diabetes mellitus with complication ([place]) 25/04/3975  . Essential hypertension 02/03/2017  . 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 HLD (hyperlipidemia) 04/16/2016  . Diabetic neuropathy associated with type 2 diabetes mellitus ([place]) 04/16/2016  . Absolute anemia 09/19/2014  . Encounter for screening colonoscopy 09/19/2014    Past Surgical History:  Procedure Laterality Date  . APPENDECTOMY    . BREAST BIOPSY Right 1999  . COLONOSCOPY  2010  . COLONOSCOPY WITH PROPOFOL N/A 10/07/2018   Procedure: COLONOSCOPY WITH PROPOFOL;  Surgeon: Lollie Sails, MD;  Location: [facility] ENDOSCOPY;  Service: Endoscopy;  Laterality: N/A;  . CORONARY ANGIOPLASTY    . CORONARY ARTERY BYPASS GRAFT N/A 02/05/2017   Procedure: CORONARY ARTERY BYPASS GRAFTING (CABG) x 4                                                                               LIMA-LAD SEQ  SVG-DIAG-OM SVG-PD;  Surgeon: Gaye Pollack, MD;  Location: [place];  Service: Open Heart Surgery;  Laterality: N/A;  . ESOPHAGOGASTRODUODENOSCOPY (EGD) WITH PROPOFOL N/A 10/07/2018   Procedure: ESOPHAGOGASTRODUODENOSCOPY (EGD) WITH PROPOFOL;  Surgeon: Lollie Sails, MD;  Location[facility] ENDOSCOPY;  Service: Endoscopy;  Laterality: N/A;  . IABP INSERTION N/A 02/04/2017   Procedure: IABP Insertion;  Surgeon: Leonie Man, MD;  Location: [place] CV LAB;  Service: Cardiovascular;  Laterality: N/A;  . LEFT HEART CATH AND CORONARY ANGIOGRAPHY Left 02/03/2017   Procedure: Left Heart Cath and Coronary Angiography;  Surgeon: Nelva Bush, MD;  Location: [place] CV LAB;  Service: Cardiovascular;  Laterality: Left;  . MITRAL VALVE REPAIR N/A 02/05/2017    Procedure: MITRAL VALVE REPLACEMENT (MVR) WITH MAGNA MITRAL EASE PERICARDIAL BIOPROSTHESIS 25 MM;  Surgeon: Gaye Pollack, MD;  Location: [place] OR;  Service: Open Heart Surgery;  Laterality: N/A;  . PARATHYROIDECTOMY     1986  . RIGHT HEART CATH N/A 02/04/2017   Procedure: Right Heart Cath;  Surgeon: Leonie Man, MD;  Location: [place] CV LAB;  Service: Cardiovascular;  Laterality: N/A;  . RIGHT/LEFT HEART CATH AND CORONARY/GRAFT ANGIOGRAPHY N/A 11/29/2019   Procedure: RIGHT/LEFT HEART CATH AND CORONARY/GRAFT ANGIOGRAPHY;  Surgeon: Sherren Mocha, MD;  Location: [place] CV LAB;  Service: Cardiovascular;  Laterality: N/A;  . TEE WITHOUT CARDIOVERSION N/A 02/05/2017   Procedure: TRANSESOPHAGEAL ECHOCARDIOGRAM (TEE);  Surgeon: Gaye Pollack, MD;  Location: [place];  Service: Open Heart Surgery;  Laterality: N/A;    Prior to Admission medications   Medication Sig Start Date End Date Taking? Authorizing Provider  acetaminophen (TYLENOL) 500 MG tablet Take 500 mg by mouth every 6 (six) hours as needed for mild pain.     [provider]  amLODipine (NORVASC) 2.5 MG tablet Take 1 tablet (2.5 mg total) by mouth daily. 11/28/19   Sherren Mocha, MD  aspirin EC 81 MG tablet Take 1 tablet (81 mg total) by mouth daily. 10/21/17   Bensimhon, Shaune Pascal, MD  carvedilol (COREG) 12.5 MG tablet Take 12.5 mg by mouth 2 (two) times daily with a meal.    [provider]  carvedilol (COREG) 6.25 MG tablet Take 6.25 mg by mouth 2 (two) times daily.    [provider]  diclofenac sodium (VOLTAREN) 1 % GEL Apply 1 application topically 4 (four) times daily as needed (pain).     [provider]  dorzolamide-timolol (COSOPT) 22.3-6.8 MG/ML ophthalmic solution Place 1 drop into both eyes 2 (two) times daily.  07/20/19   [provider]  ENTRESTO 24-26 MG Take 1 tablet by mouth 2 (two) times daily. 09/09/19   [provider]  ferrous sulfate 325 (65 FE) MG tablet Take  325 mg by mouth daily with breakfast.    [provider]  gabapentin (NEURONTIN) 300 MG capsule Take 300 mg by mouth daily.     [provider]  isosorbide mononitrate (IMDUR) 30 MG 24 hr tablet Take 1 tablet by mouth once daily 02/13/20   Sherren Mocha, MD  latanoprost (XALATAN) 0.005 % ophthalmic solution Place 1 drop into both eyes at bedtime.     [provider]  Magnesium 250 MG TABS Take 250 mg by mouth daily.    [provider]  Potassium Chloride ER 20 MEQ TBCR TAKE 1 TABLET BY MOUTH ONCE DAILY Patient taking differently: Take 20 mEq by mouth daily.  09/16/18   Bensimhon, Shaune Pascal, MD  rosuvastatin (  CRESTOR) 40 MG tablet Take 40 mg by mouth every evening.     [provider]  timolol (BETIMOL) 0.5 % ophthalmic solution Apply to eye.    [provider]  timolol (BETIMOL) 0.5 % ophthalmic solution Apply to eye.    [provider]  torsemide (DEMADEX) 20 MG tablet Take 2 tablets (40 mg total) by mouth daily. 11/29/19   Sherren Mocha, MD    Allergies Patient has no known allergies.  Family History  Problem Relation Age of Onset  . Diabetes Father   . Diabetes Sister   . Bladder Cancer Neg Hx   . Kidney cancer Neg Hx   . Prostate cancer Neg Hx     Social History Social History   Tobacco Use  . Smoking status: Never Smoker  . Smokeless tobacco: Never Used  Vaping Use  . Vaping Use: Never used  Substance Use Topics  . Alcohol use: No  . Drug use: Never      Review of Systems Constitutional: No fever/chills, weakness, fatigue Eyes: No visual changes. ENT: No sore throat. Cardiovascular: Denies chest pain. Respiratory: Denies shortness of breath. Gastrointestinal: No abdominal pain.  No nausea, no vomiting.  No diarrhea.  No constipation. Genitourinary: Negative for dysuria.  Smell to urine Musculoskeletal: Negative for back pain. Skin: Negative for rash. Neurological: Negative for headaches, focal  weakness or numbness. All other ROS negative ____________________________________________   PHYSICAL EXAM:  VITAL SIGNS: ED Triage Vitals  Enc Vitals Group     BP 06/16/20 2101 118/82     Pulse Rate 06/16/20 2101 99     Resp 06/16/20 2101 19     Temp 06/16/20 2101 98 F (36.7 C)     Temp Source 06/16/20 2101 Oral     SpO2 06/16/20 2101 97 %     Weight 06/16/20 2100 170 lb (77.1 kg)     Height 06/16/20 2100 5\' 3"  (1.6 m)     Head Circumference --      Peak Flow --      Pain Score 06/16/20 2100 0     Pain Loc --      Pain Edu? --      Excl. in [place]? --     Constitutional: Alert and oriented. Well appearing and in no acute distress. Eyes: Conjunctivae are normal. EOMI. Head: Atraumatic. Nose: No congestion/rhinnorhea. Mouth/Throat: Mucous membranes are moist.   Neck: No stridor. Trachea Midline. FROM Cardiovascular: Normal rate, regular rhythm. Grossly normal heart sounds.  Good peripheral circulation. Respiratory: Normal respiratory effort.  No retractions. Lungs CTAB. Gastrointestinal: Soft and nontender. No distention. No abdominal bruits.  Musculoskeletal: No lower extremity tenderness nor edema.  No joint effusions. Neurologic:  Normal speech and language.  Cranial nerves II through XII are intact with equal strength in arms and legs.  Sensation intact. Skin:  Skin is warm, dry and intact. No rash noted. Psychiatric: Mood and affect are normal. Speech and behavior are normal. GU: Deferred   ____________________________________________   LABS (all labs ordered are listed, but only abnormal results are displayed)  Labs Reviewed  BASIC METABOLIC PANEL - Abnormal; Notable for the following components:      Result Value   Potassium 2.9 (*)    Glucose, Bld 126 (*)    BUN 34 (*)    Creatinine, Ser 3.22 (*)    GFR calc non Af Amer 13 (*)    GFR calc Af Amer 15 (*)    All other components within normal  limits  CBC - Abnormal; Notable for the following components:    RBC 5.15 (*)    Hemoglobin 15.4 (*)    All other components within normal limits  URINALYSIS, COMPLETE (UACMP) WITH MICROSCOPIC   ____________________________________________   ED ECG REPORT I, Vanessa [place], the attending physician, personally viewed and interpreted this ECG.  Atrial fibrillation with T wave inversion in V2 through V5 to 1 aVL, no ST elevation, normal intervals otherwise.  On review of prior EKG she is had T wave inversions previously ____________________________________________  RADIOLOGY Robert Bellow, personally viewed and evaluated these images (plain radiographs) as part of my medical decision making, as well as reviewing the written report by the radiologist.  ED MD interpretation: No significant pleural effusion  Official radiology report(s): CT Head Wo Contrast  Result Date: 06/17/2020 CLINICAL DATA:  Headache EXAM: CT HEAD WITHOUT CONTRAST TECHNIQUE: Contiguous axial images were obtained from the base of the skull through the vertex without intravenous contrast. COMPARISON:  09/05/2018 FINDINGS: Brain: There is no mass, hemorrhage or extra-axial collection. The size and configuration of the ventricles and extra-axial CSF spaces are normal. There is hypoattenuation of the white matter, most commonly indicating chronic small vessel disease. Remote left cerebellar small vessel infarct. Vascular: No abnormal hyperdensity of the major intracranial arteries or dural venous sinuses. No intracranial atherosclerosis. Skull: The visualized skull base, calvarium and extracranial soft tissues are normal. Sinuses/Orbits: No fluid levels or advanced mucosal thickening of the visualized paranasal sinuses. No mastoid or middle ear effusion. The orbits are normal. IMPRESSION: 1. No acute intracranial abnormality. 2. Chronic small vessel disease and remote left cerebellar small vessel infarct. Electronically Signed   By: Ulyses Jarred M.D.   On: 06/17/2020 02:37   DG Chest Portable  1 View  Result Date: 06/17/2020 CLINICAL DATA:  Generalized weakness, short of breath EXAM: PORTABLE CHEST 1 VIEW COMPARISON:  12/23/2018 FINDINGS: Single frontal view of the chest demonstrates stable postsurgical changes from median sternotomy and mitral valve replacement. Cardiac silhouette remains enlarged. Stable ectasia of the thoracic aorta. No airspace disease, effusion, or pneumothorax. No acute bony abnormalities. IMPRESSION: 1. Stable cardiomegaly and thoracic aortic ectasia. 2. No acute intrathoracic process. Electronically Signed   By: Randa Ngo M.D.   On: 06/17/2020 02:31   CT Renal Stone Study  Result Date: 06/17/2020 CLINICAL DATA:  Hematuria, unknown cause, generalized weakness and hand shaking since Friday EXAM: CT ABDOMEN AND PELVIS WITHOUT CONTRAST TECHNIQUE: Multidetector CT imaging of the abdomen and pelvis was performed following the standard protocol without IV contrast. COMPARISON:  CT 07/22/2015, MRI 05/16/2020 renal ultrasound 02/21/2020 FINDINGS: Lower chest: Coarse reticular changes present in the lung bases with an area of bandlike opacity in left lung base likely reflecting subsegmental atelectasis or scarring. No focal consolidative opacity or effusion. Cardiomegaly is present extensive coronary artery calcifications. Evidence of prior mitral valve replacement. Dense calcifications on the aortic leaflets. Distal thoracic aortic calcification noted as well. Hepatobiliary: No visible focal liver lesions. Punctate calcifications seen in the left lobe liver, stable from prior, may reflect sequela of prior granulomatous disease. Dependently layering calcifications noted in the gallbladder. No pericholecystic fluid or inflammation. No biliary ductal dilatation or calcified intraductal gallstones. Pancreas: Small cystic focus in the head of the pancreas measuring 6 mm (2/32). Additional cystic focus towards the tail of the pancreas measuring 7 mm similar in appearance to comparison  CT (2/26). No inflammation or ductal dilatation. Spleen: Normal in size without focal abnormality. Adrenals/Urinary Tract:  No concerning adrenal lesions. Bilateral areas of cortical scarring. Large complex multi septate cystic structure in the left kidney measuring up to 7 cm by 3.9 cm by 6.9 cm unchanged in comparison to prior exams. Additional fluid attenuation cysts present in both kidneys. A smaller hyperdense cyst is seen in the lower pole right kidney, unchanged from comparison some likely benign hyperdense renal cyst. No obstructive urolithiasis. Multiple nonobstructing calculi present in the lower pole right kidney. Bladder decompressed circumferential bladder wall thickening and edematous changes of the bladder wall on a background of more chronic intramural fatty infiltration Stomach/Bowel: Small sliding-type hiatal hernia. Gastric wall thickening likely related to underdistention. Duodenum is unremarkable. No small bowel thickening or dilatation. The appendix is surgically absent. No colonic dilatation or wall thickening. Scattered colonic diverticula without focal inflammation to suggest diverticulitis. Vascular/Lymphatic: Atherosclerotic calcifications throughout the abdominal aorta and branch vessels. No aneurysm or ectasia. No enlarged abdominopelvic lymph nodes. Reproductive: Heterogeneous, lobular uterus compatible with uterine fibroids, in similar configuration to the comparison study. No new concerning adnexal lesions. Other: Mild body wall edema. No abdominopelvic free air or fluid. No bowel containing hernia. Small fat containing umbilical hernia. Musculoskeletal: Multilevel degenerative changes are present in the imaged portions of the spine. Additional degenerative changes in the hips and pelvis. The osseous structures appear diffusely demineralized which may limit detection of small or nondisplaced fractures. No acute osseous abnormality or suspicious osseous lesion. IMPRESSION: 1. Bladder  decompressed but with with circumferential bladder wall thickening greater than expected for underdistention and edematous changes of the bladder wall on a background of more chronic intramural fatty infiltration. Correlate with urinalysis to exclude cystitis. 2. 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 Probable hyperdense cyst in the lower pole right kidney as well. 3. Nonobstructing right nephrolithiasis. No obstructive urolithiasis or hydronephrosis. 4. Stable appearance of a 7 mm cystic focus in the tail the pancreas but with new 7 mm cystic structure seen at the pancreatic head. Consider follow-up pancreatic protocol MRI/MRCP in 1 year which would align with patient's follow-up for the lesion in the tail. This recommendation follows ACR consensus guidelines: Management of Incidental Pancreatic Cysts: A White Paper of the ACR Incidental Findings Committee. [place] 1093;23:557-322. 5. Cholelithiasis without evidence of acute cholecystitis. 6. Colonic diverticulosis without evidence of diverticulitis. 7. Fibroid uterus. 8. Aortic Atherosclerosis (ICD10-I70.0). Electronically Signed   By: Lovena Le M.D.   On: 06/17/2020 02:41    ____________________________________________   PROCEDURES  Procedure(s) performed (including Critical Care):  Procedures   ____________________________________________   INITIAL IMPRESSION / ASSESSMENT AND PLAN / ED COURSE  SANAH KRASKA was evaluated in Emergency Department on 06/17/2020 for the symptoms described in the history of present illness. She was evaluated in the context of the global COVID-19 pandemic, which necessitated consideration that the patient might be at risk for infection with the SARS-CoV-2 virus that causes COVID-19. Institutional protocols and algorithms that pertain to the evaluation of patients at risk for  COVID-19 are in a state of rapid change based on information released by regulatory bodies including the CDC and federal and state organizations. These policies and algorithms were followed during the patient's care in the ED.    Patient is an 85 year old who comes in with increasing weakness, fatigue.  Will get labs to evaluate Electra abnormalities, AKI, anemia, UTI.  Will get CT head evaluate for intracranial  hemorrhage versus tumor but patient seems to have a nonfocal neurological exam and I have low suspicion for stroke at this time.  Patient does have some edema in her bilateral lower extremities at minimal nature.  Will get chest x-ray to make sure there is no fluid on her lungs.  Consider her fatigue being a symptom of her aortic stenosis and will get her repeat BNP.  Patient may need to be seen by cardiology/have repeat echo given her significant cardiac history.  No evidence of anemia Her potassium is low at 2.9 given her AKI we will start off with 10 of IV and 20 a p.o. K Kidney function stable at 3.22  Urine looks consistent with UTI but also has significant amount of RBCs in it therefore also had a CT renal that was negative for kidney stone but consistent with cystitis.  There was some incidental findings I did discuss with family for follow-up with MRI and they expressed understanding.  Her troponin was slightly elevated but will need repeat and her BNP was also slightly elevated as well.  We will discuss possible team for admission given her confusion, weakness, UTI and consideration of further work-up for her aortic stenosis  ____________________________________________   FINAL CLINICAL IMPRESSION(S) / ED DIAGNOSES   Final diagnoses:  Hypokalemia  Weakness  Acute cystitis without hematuria  Aortic valve stenosis, etiology of cardiac valve disease unspecified      MEDICATIONS GIVEN DURING THIS VISIT:  Medications  potassium chloride 10 mEq in 100 mL IVPB (has no  administration in time range)  cefTRIAXone (ROCEPHIN) 1 g in sodium chloride 0.9 % 100 mL IVPB (has no administration in time range)  potassium chloride SA (KLOR-CON) CR tablet 20 mEq (20 mEq Oral Given 06/17/20 0238)     ED Discharge Orders    None       Note:  This document was prepared using Dragon voice recognition software and may include unintentional dictation errors.   Vanessa [place], MD 06/17/20 [phone]

## 2020-06-17 NOTE — ED Notes (Signed)
Pt ambulatory to toilet with cane. Steady gait noted.

## 2020-06-18 ENCOUNTER — Observation Stay
Admit: 2020-06-18 | Discharge: 2020-06-18 | Disposition: A | Discharge status: Home / Self Care | Payer: Medicare HMO | Attending: Internal Medicine | Admitting: Internal Medicine

## 2020-06-18 DIAGNOSIS — R531 Weakness: Secondary | ICD-10-CM | POA: Diagnosis not present

## 2020-06-18 LAB — BASIC METABOLIC PANEL
Anion gap: 12 (ref 5–15)
BUN: 34 mg/dL — ABNORMAL HIGH (ref 8–23)
CO2: 28 mmol/L (ref 22–32)
Calcium: 9.9 mg/dL (ref 8.9–10.3)
Chloride: 102 mmol/L (ref 98–111)
Creatinine, Ser: 2.61 mg/dL — ABNORMAL HIGH (ref 0.44–1.00)
GFR calc Af Amer: 19 mL/min — ABNORMAL LOW (ref >60)
GFR calc non Af Amer: 16 mL/min — ABNORMAL LOW (ref >60)
Glucose, Bld: 116 mg/dL — ABNORMAL HIGH (ref 70–99)
Potassium: 2.9 mmol/L — ABNORMAL LOW (ref 3.5–5.1)
Sodium: 142 mmol/L (ref 135–145)

## 2020-06-18 LAB — GLUCOSE, CAPILLARY
Glucose-Capillary: 134 mg/dL — ABNORMAL HIGH (ref 70–99)
Glucose-Capillary: 152 mg/dL — ABNORMAL HIGH (ref 70–99)
Glucose-Capillary: 118 mg/dL — ABNORMAL HIGH (ref 70–99)
Glucose-Capillary: 139 mg/dL — ABNORMAL HIGH (ref 70–99)
Glucose-Capillary: 113 mg/dL — ABNORMAL HIGH (ref 70–99)
Glucose-Capillary: 114 mg/dL — ABNORMAL HIGH (ref 70–99)

## 2020-06-18 LAB — ECHOCARDIOGRAM COMPLETE
Height: 67 in
Weight: 2643.2 oz

## 2020-06-18 MED ORDER — POTASSIUM CHLORIDE CRYS ER 20 MEQ PO TBCR
40.0000 meq | EXTENDED_RELEASE_TABLET | Freq: Once | ORAL | Status: AC
  Administered 2020-06-18: 40 meq via ORAL
  Filled 2020-06-18: qty 2

## 2020-06-18 MED ORDER — POTASSIUM CHLORIDE 10 MEQ/100ML IV SOLN
10.0000 meq | INTRAVENOUS | Status: AC
  Administered 2020-06-18 (×4): 10 meq via INTRAVENOUS
  Filled 2020-06-18 (×4): qty 100

## 2020-06-18 NOTE — Progress Notes (Signed)
IV infiltrated after 1 dose of potassium.  Patient still needs to get 3 more runs.  This RN attempted to get a new IV and was unsuccessful.  IV team consulted and will infuse potassium when patient regains IV access.

## 2020-06-18 NOTE — Progress Notes (Signed)
PROGRESS NOTE    [name] [name]  CXK:[id number] DOB: Sep 10, 1935 DOA: 06/17/2020 PCP: Kirk Ruths, MD    Brief Narrative:  [name] [name] a 85 y.o.femalewith medical history significant forCAD status post CABG, mild to moderate aortic stenosis, bioprosthetic mitral valve replacement, systolic CHF secondary to ischemic cardiomyopathy, EF 25%, CKD 4, paroxysmal A. fib not on anticoagulation, HTN and diabetes who presents with a 2-day history of increasing fatigue resulting in her sleeping a lot staying in bed and having difficulty ambulating, using her cane more than usual.Urinalysis strongly consistent with UTI.  She had an abdominal CT that showed findings consistent with cystitis, and also showed stable right renal cysts and pancreatic cyst.  Chest x-ray showed no acute disease.  CT head no acute disease.  Patient started on IV Rocephin.  Hospitalist consulted for admission.   Consultants:     Procedures:   Antimicrobials:   Rocephin   Subjective: Feeling little better today . unsur if feels weak. No sob, cp, or abdominal pain  Objective: Vitals:   06/18/20 0815 06/18/20 1105 06/18/20 1622 06/18/20 1623  BP: 117/86 115/85 (!) 126/99 (!) 141/93  Pulse: 88 82 93 91  Resp: 20 18 17 17   Temp: 98.1 F (36.7 C) 98.6 F (37 C) 98.3 F (36.8 C) 98.3 F (36.8 C)  TempSrc: Oral     SpO2: 100% 100% 100% 100%  Weight:      Height:        Intake/Output Summary (Last 24 hours) at 06/18/2020 1758 Last data filed at 06/18/2020 1518 Gross per 24 hour  Intake 452.39 ml  Output 0 ml  Net 452.39 ml   Filed Weights   06/16/20 2100 06/17/20 1253 06/18/20 0507  Weight: 77.1 kg 74.8 kg 74.9 kg    Examination:  General exam: Appears calm and comfortable  Respiratory system: Clear to auscultation. Respiratory effort normal. Cardiovascular system: S1 & S2 heard, RRR. No JVD, murmurs, rubs, gallops or clicks. No pedal edema. Gastrointestinal system: Abdomen is  nondistended, soft and nontender. Normal bowel sounds heard. Central nervous system: Alert and oriented.  Grossly intact Extremities: No edema Skin: Warm dry Psychiatry: Judgement and insight appear normal. Mood & affect appropriate.     Data Reviewed: I have personally reviewed following labs and imaging studies  CBC: Recent Labs  Lab 06/16/20 2109  WBC 7.5  HGB 15.4*  HCT 45.5  MCV 88.3  PLT 970   Basic Metabolic Panel: Recent Labs  Lab 06/16/20 2109 06/17/20 0601 06/17/20 0903 06/18/20 0633  NA 139  --  141 142  K 2.9* 3.0* 3.4* 2.9*  CL 99  --  100 102  CO2 28  --  28 28  GLUCOSE 126*  --  125* 116*  BUN 34*  --  34* 34*  CREATININE 3.22*  --  2.98* 2.61*  CALCIUM 10.1  --  9.6 9.9  MG 3.5*  --   --   --    GFR: Estimated Creatinine Clearance: 17.6 mL/min (A) (by C-G formula based on SCr of 2.61 mg/dL (H)). Liver Function Tests: Recent Labs  Lab 06/16/20 2109  AST 24  ALT 17  ALKPHOS 63  BILITOT 2.4*  PROT 5.8*  ALBUMIN 3.2*   No results for input(s): LIPASE, AMYLASE in the last 168 hours. No results for input(s): AMMONIA in the last 168 hours. Coagulation Profile: No results for input(s): INR, PROTIME in the last 168 hours. Cardiac Enzymes: No results for input(s): CKTOTAL, CKMB, CKMBINDEX, TROPONINI  in the last 168 hours. BNP (last 3 results) No results for input(s): PROBNP in the last 8760 hours. HbA1C: Recent Labs    06/17/20 0601  HGBA1C 7.3*   CBG: Recent Labs  Lab 06/17/20 1721 06/17/20 2130 06/18/20 0816 06/18/20 1156 06/18/20 1625  GLUCAP 134* 152* 118* 139* 113*   Lipid Profile: No results for input(s): CHOL, HDL, LDLCALC, TRIG, CHOLHDL, LDLDIRECT in the last 72 hours. Thyroid Function Tests: No results for input(s): TSH, T4TOTAL, FREET4, T3FREE, THYROIDAB in the last 72 hours. Anemia Panel: No results for input(s): VITAMINB12, FOLATE, FERRITIN, TIBC, IRON, RETICCTPCT in the last 72 hours. Sepsis Labs: No results for  input(s): PROCALCITON, LATICACIDVEN in the last 168 hours.  Recent Results (from the past 240 hour(s))  SARS Coronavirus 2 by RT PCR (hospital order, performed in [facility] hospital lab) Nasopharyngeal Nasopharyngeal Swab     Status: None   Collection Time: 06/17/20  3:22 AM   Specimen: Nasopharyngeal Swab  Result Value Ref Range Status   SARS Coronavirus 2 NEGATIVE NEGATIVE Final    Comment: (NOTE) SARS-CoV-2 target nucleic acids are NOT DETECTED.  The SARS-CoV-2 RNA is generally detectable in upper and lower respiratory specimens during the acute phase of infection. The lowest concentration of SARS-CoV-2 viral copies this assay can detect is 250 copies / mL. A negative result does not preclude SARS-CoV-2 infection and should not be used as the sole basis for treatment or other patient management decisions.  A negative result may occur with improper specimen collection / handling, submission of specimen other than nasopharyngeal swab, presence of viral mutation(s) within the areas targeted by this assay, and inadequate number of viral copies (<250 copies / mL). A negative result must be combined with clinical observations, patient history, and epidemiological information.  Fact Sheet for Patients:   StrictlyIdeas.no  Fact Sheet for Healthcare Providers: BankingDealers.co.za  This test is not yet approved or  cleared by the [place] FDA and has been authorized for detection and/or diagnosis of SARS-CoV-2 by FDA under an Emergency Use Authorization (EUA).  This EUA will remain in effect (meaning this test can be used) for the duration of the COVID-19 declaration under Section 564(b)(1) of the Act, 21 U.S.C. section 360bbb-3(b)(1), unless the authorization is terminated or revoked sooner.  Performed at [facility], [address], [place], [place] [address]          Radiology Studies: CT Head Wo  Contrast  Result Date: 06/17/2020 CLINICAL DATA:  Headache EXAM: CT HEAD WITHOUT CONTRAST TECHNIQUE: Contiguous axial images were obtained from the base of the skull through the vertex without intravenous contrast. COMPARISON:  09/05/2018 FINDINGS: Brain: There is no mass, hemorrhage or extra-axial collection. The size and configuration of the ventricles and extra-axial CSF spaces are normal. There is hypoattenuation of the white matter, most commonly indicating chronic small vessel disease. Remote left cerebellar small vessel infarct. Vascular: No abnormal hyperdensity of the major intracranial arteries or dural venous sinuses. No intracranial atherosclerosis. Skull: The visualized skull base, calvarium and extracranial soft tissues are normal. Sinuses/Orbits: No fluid levels or advanced mucosal thickening of the visualized paranasal sinuses. No mastoid or middle ear effusion. The orbits are normal. IMPRESSION: 1. No acute intracranial abnormality. 2. Chronic small vessel disease and remote left cerebellar small vessel infarct. Electronically Signed   By: Ulyses Jarred M.D.   On: 06/17/2020 02:37   DG Chest Portable 1 View  Result Date: 06/17/2020 CLINICAL DATA:  Generalized weakness, short of breath EXAM:  PORTABLE CHEST 1 VIEW COMPARISON:  12/23/2018 FINDINGS: Single frontal view of the chest demonstrates stable postsurgical changes from median sternotomy and mitral valve replacement. Cardiac silhouette remains enlarged. Stable ectasia of the thoracic aorta. No airspace disease, effusion, or pneumothorax. No acute bony abnormalities. IMPRESSION: 1. Stable cardiomegaly and thoracic aortic ectasia. 2. No acute intrathoracic process. Electronically Signed   By: Randa Ngo M.D.   On: 06/17/2020 02:31   ECHOCARDIOGRAM COMPLETE  Result Date: 06/18/2020    ECHOCARDIOGRAM REPORT   Patient Name:   [name] [name] Date of Exam: 06/18/2020 Medical Rec #:  [id number]         Height:       67.0 in Accession #:     [id number]        Weight:       165.2 lb Date of Birth:  [birth year]          BSA:          1.864 m Patient Age:    67 years          BP:           120/80 mmHg Patient Gender: F                 HR:           99 bpm. Exam Location:  [facility] Procedure: 2D Echo, Color Doppler and Cardiac Doppler Indications:     Elevated troponin  History:         Patient has prior history of Echocardiogram examinations. CAD,                  Prior CABG; Risk Factors:Hypertension, Dyslipidemia and                  Diabetes. Bioprosthetic MVR.  Sonographer:     Charmayne Sheer RDCS (AE) Referring Phys:  [id number] [facility] Kayson Bullis Diagnosing Phys: Isaias Cowman MD IMPRESSIONS  1. Left ventricular ejection fraction, by estimation, is 40 to 45%. The left ventricle has mildly decreased function. The left ventricle has no regional wall motion abnormalities. Left ventricular diastolic parameters are indeterminate.  2. Right ventricular systolic function is normal. The right ventricular size is normal. There is moderately elevated pulmonary artery systolic pressure.  3. The mitral valve is normal in structure. Moderate mitral valve regurgitation. No evidence of mitral stenosis.  4. Tricuspid valve regurgitation is moderate.  5. The aortic valve is normal in structure. Aortic valve regurgitation is mild. No aortic stenosis is present.  6. The inferior vena cava is normal in size with greater than 50% respiratory variability, suggesting right atrial pressure of 3 mmHg. FINDINGS  Left Ventricle: Left ventricular ejection fraction, by estimation, is 40 to 45%. The left ventricle has mildly decreased function. The left ventricle has no regional wall motion abnormalities. The left ventricular internal cavity size was normal in size. There is no left ventricular hypertrophy. Left ventricular diastolic parameters are indeterminate. Right Ventricle: The right ventricular size is normal. No increase in right ventricular wall thickness. Right ventricular systolic  function is normal. There is moderately elevated pulmonary artery systolic pressure. The tricuspid regurgitant velocity is 3.00 m/s, and with an assumed right atrial pressure of 10 mmHg, the estimated right ventricular systolic pressure is 32.2 mmHg. Left Atrium: Left atrial size was normal in size. Right Atrium: Right atrial size was normal in size. Pericardium: There is no evidence of pericardial effusion. Mitral Valve: The mitral valve is normal in structure. Normal mobility  of the mitral valve leaflets. Moderate mitral valve regurgitation. No evidence of mitral valve stenosis. MV peak gradient, 18.5 mmHg. The mean mitral valve gradient is 10.0 mmHg. Tricuspid Valve: The tricuspid valve is normal in structure. Tricuspid valve regurgitation is moderate . No evidence of tricuspid stenosis. Aortic Valve: The aortic valve is normal in structure. Aortic valve regurgitation is mild. No aortic stenosis is present. Aortic valve mean gradient measures 9.0 mmHg. Aortic valve peak gradient measures 16.0 mmHg. Aortic valve area, by VTI measures 1.31  cm. Pulmonic Valve: The pulmonic valve was normal in structure. Pulmonic valve regurgitation is not visualized. No evidence of pulmonic stenosis. Aorta: The aortic root is normal in size and structure. Venous: The inferior vena cava is normal in size with greater than 50% respiratory variability, suggesting right atrial pressure of 3 mmHg. IAS/Shunts: No atrial level shunt detected by color flow Doppler.  LEFT VENTRICLE PLAX 2D LVIDd:         5.15 cm  Diastology LVIDs:         4.08 cm  LV e' lateral:   4.46 cm/s LV PW:         0.94 cm  LV E/e' lateral: 47.8 LV IVS:        0.99 cm LVOT diam:     1.90 cm LV SV:         44 LV SV Index:   23 LVOT Area:     2.84 cm  RIGHT VENTRICLE RV Basal diam:  3.78 cm LEFT ATRIUM              Index       RIGHT ATRIUM           Index LA diam:        4.00 cm  2.15 cm/m  RA Area:     16.80 cm LA Vol (A2C):   92.6 ml  49.67 ml/m RA Volume:    41.30 ml  22.15 ml/m LA Vol (A4C):   104.0 ml 55.78 ml/m LA Biplane Vol: 105.0 ml 56.32 ml/m  AORTIC VALVE                    PULMONIC VALVE AV Area (Vmax):    1.14 cm     PV Vmax:       0.62 m/s AV Area (Vmean):   1.16 cm     PV Vmean:      42.600 cm/s AV Area (VTI):     1.31 cm     PV VTI:        0.084 m AV Vmax:           200.00 cm/s  PV Peak grad:  1.5 mmHg AV Vmean:          136.000 cm/s PV Mean grad:  1.0 mmHg AV VTI:            0.334 m AV Peak Grad:      16.0 mmHg AV Mean Grad:      9.0 mmHg LVOT Vmax:         80.40 cm/s LVOT Vmean:        55.500 cm/s LVOT VTI:          0.154 m LVOT/AV VTI ratio: 0.46  AORTA Ao Root diam: 3.20 cm MITRAL VALVE                TRICUSPID VALVE MV Area (PHT): 2.59 cm     TR Peak grad:   36.0 mmHg  MV Peak grad:  18.5 mmHg    TR Vmax:        300.00 cm/s MV Mean grad:  10.0 mmHg MV Vmax:       2.15 m/s     SHUNTS MV Vmean:      155.0 cm/s   Systemic VTI:  0.15 m MV Decel Time: 293 msec     Systemic Diam: 1.90 cm MV E velocity: 213.00 cm/s Isaias Cowman MD Electronically signed by Isaias Cowman MD Signature Date/Time: 06/18/2020/1:04:32 PM    Final    CT Renal Stone Study  Result Date: 06/17/2020 CLINICAL DATA:  Hematuria, unknown cause, generalized weakness and hand shaking since Friday EXAM: CT ABDOMEN AND PELVIS WITHOUT CONTRAST TECHNIQUE: Multidetector CT imaging of the abdomen and pelvis was performed following the standard protocol without IV contrast. COMPARISON:  CT 07/22/2015, MRI 05/16/2020 renal ultrasound 02/21/2020 FINDINGS: Lower chest: Coarse reticular changes present in the lung bases with an area of bandlike opacity in left lung base likely reflecting subsegmental atelectasis or scarring. No focal consolidative opacity or effusion. Cardiomegaly is present extensive coronary artery calcifications. Evidence of prior mitral valve replacement. Dense calcifications on the aortic leaflets. Distal thoracic aortic calcification noted as well.  Hepatobiliary: No visible focal liver lesions. Punctate calcifications seen in the left lobe liver, stable from prior, may reflect sequela of prior granulomatous disease. Dependently layering calcifications noted in the gallbladder. No pericholecystic fluid or inflammation. No biliary ductal dilatation or calcified intraductal gallstones. Pancreas: Small cystic focus in the head of the pancreas measuring 6 mm (2/32). Additional cystic focus towards the tail of the pancreas measuring 7 mm similar in appearance to comparison CT (2/26). No inflammation or ductal dilatation. Spleen: Normal in size without focal abnormality. Adrenals/Urinary Tract: No concerning adrenal lesions. Bilateral areas of cortical scarring. Large complex multi septate cystic structure in the left kidney measuring up to 7 cm by 3.9 cm by 6.9 cm unchanged in comparison to prior exams. Additional fluid attenuation cysts present in both kidneys. A smaller hyperdense cyst is seen in the lower pole right kidney, unchanged from comparison some likely benign hyperdense renal cyst. No obstructive urolithiasis. Multiple nonobstructing calculi present in the lower pole right kidney. Bladder decompressed circumferential bladder wall thickening and edematous changes of the bladder wall on a background of more chronic intramural fatty infiltration Stomach/Bowel: Small sliding-type hiatal hernia. Gastric wall thickening likely related to underdistention. Duodenum is unremarkable. No small bowel thickening or dilatation. The appendix is surgically absent. No colonic dilatation or wall thickening. Scattered colonic diverticula without focal inflammation to suggest diverticulitis. Vascular/Lymphatic: Atherosclerotic calcifications throughout the abdominal aorta and branch vessels. No aneurysm or ectasia. No enlarged abdominopelvic lymph nodes. Reproductive: Heterogeneous, lobular uterus compatible with uterine fibroids, in similar configuration to the comparison  study. No new concerning adnexal lesions. Other: Mild body wall edema. No abdominopelvic free air or fluid. No bowel containing hernia. Small fat containing umbilical hernia. Musculoskeletal: Multilevel degenerative changes are present in the imaged portions of the spine. Additional degenerative changes in the hips and pelvis. The osseous structures appear diffusely demineralized which may limit detection of small or nondisplaced fractures. No acute osseous abnormality or suspicious osseous lesion. IMPRESSION: 1. Bladder decompressed but with with circumferential bladder wall thickening greater than expected for underdistention and edematous changes of the bladder wall on a background of more chronic intramural fatty infiltration. Correlate with urinalysis to exclude cystitis. 2. 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 Probable hyperdense cyst in the lower pole right kidney as well. 3. Nonobstructing right nephrolithiasis. No obstructive urolithiasis or hydronephrosis. 4. Stable appearance of a 7 mm cystic focus in the tail the pancreas but with new 7 mm cystic structure seen at the pancreatic head. Consider follow-up pancreatic protocol MRI/MRCP in 1 year which would align with patient's follow-up for the lesion in the tail. This recommendation follows ACR consensus guidelines: Management of Incidental Pancreatic Cysts: A White Paper of the ACR Incidental Findings Committee. [place] [id number]. 5. Cholelithiasis without evidence of acute cholecystitis. 6. Colonic diverticulosis without evidence of diverticulitis. 7. Fibroid uterus. 8. Aortic Atherosclerosis (ICD10-I70.0). Electronically Signed   By: Lovena Le M.D.   On: 06/17/2020 02:41        Scheduled Meds: . heparin  5,000 Units Subcutaneous Q8H  . insulin aspart  0-15 Units Subcutaneous TID WC  . insulin aspart   0-5 Units Subcutaneous QHS   Continuous Infusions: . cefTRIAXone (ROCEPHIN)  IV 1 g (06/17/20 2127)    Assessment & Plan:   Principal Problem:   Generalized weakness Active Problems:   [name]   Acute kidney injury superimposed on CKD stage IV(HCC)   Type 2 diabetes mellitus with complication (HCC)   Essential hypertension   [name]   PAF (paroxysmal atrial fibrillation) (HCC)   Chronic systolic CHF (congestive heart failure) (HCC)   Diabetic neuropathy associated with type 2 diabetes mellitus (HCC)   UTI (urinary tract infection)   Hypokalemia   Aortic stenosis   History of mitral valve replacement with bioprosthetic valve   Elevated troponin   Abnormal CT of the abdomen  Generalized weakness -Suspect multifactorial related to secondary to UTI, AKI superimposed on her multiple chronic medical conditions -Treat etiologies as outlined below -Physical therapy evaluation pending    UTI (urinary tract infection) -Urinalysis consistent with UTI and abdominal CT showing cystitis -Continue IV Rocephin -Follow cultures not shown yet    Hypokalemia -Continue potassium supplementation, IV n.p.o. -Possibly related to home diuretics -Continue to monitor    Acute kidney injury superimposed on CKD stage IV(HCC) -Creatinine elevated at 3.22 above baseline of 1.74 -Likely prerenal related to diuretic use Improving slowly, creatinine 2.61  Elevated troponin   [name] with history of CABG x4 -No complaints of chest pain.  Troponin 34.  EKG with nonspecific ST-T wave changes -Continue troponin trend to peak -Continue antiplatelets statins and beta-blockers Echo pending    Type 2 diabetes mellitus with neuropathic complication (HCC) -Sliding scale insulin coverage pending med rec    Essential hypertension -Continue home meds    PAF (paroxysmal atrial fibrillation) (HCC) -Currently on aspirin and Plavix not on systemic  anticoagulation -Currently rate controlled    Chronic systolic CHF (congestive heart failure) ([place]) -Patient appears euvolemic in spite of elevated BNP of 5095.  Chest x-ray showed no acute disease -Monitor for decompensation in view of IV hydration being given for acute kidney injury    Aortic stenosis, mild to moderate -Seen by cardiologist 05/14/2020.  Not currently a candidate for TAVR as not severe enough    History of mitral valve replacement with bioprosthetic valve -No acute concerns  Renal cyst on CT Pancreatic cyst on CT -Chronic stable findings "with recommendations for follow-up study        DVT prophylaxis: Heparin Code Status: full code  Family Communication:  none  Disposition Plan:  Back to previous home environment Status is: Observation   Dispo: The patient is from: Home              Anticipated d/c is to: Home              Anticipated d/c date is: 1 day              Patient currently is not medically stable to d/c.            LOS: 0 days   Time spent: 45 minutes with more than 50% COC    Nolberto Hanlon, MD [facility] Pager [phone]  If 7PM-7AM, please contact night-coverage [email or web address] Password [facility] 06/18/2020, 5:58 PM

## 2020-06-18 NOTE — Progress Notes (Signed)
*  PRELIMINARY RESULTS* Echocardiogram 2D Echocardiogram has been performed.  [name] [name] Tuesday Terlecki 06/18/2020, 8:47 AM

## 2020-06-18 NOTE — Care Management Obs Status (Signed)
[place] NOTIFICATION   Patient Details  Name: [name] [name] MRN: [id number] Date of Birth: 1935/05/14   Medicare Observation Status Notification Given:  Yes    Kiernan Farkas A Bre [place], LCSW 06/18/2020, 9:52 AM

## 2020-06-19 DIAGNOSIS — Z953 Presence of xenogenic heart valve: Secondary | ICD-10-CM | POA: Diagnosis not present

## 2020-06-19 DIAGNOSIS — E1122 Type 2 diabetes mellitus with diabetic chronic kidney disease: Secondary | ICD-10-CM | POA: Diagnosis present

## 2020-06-19 DIAGNOSIS — R778 Other specified abnormalities of plasma proteins: Secondary | ICD-10-CM | POA: Diagnosis present

## 2020-06-19 DIAGNOSIS — N184 Chronic kidney disease, stage 4 (severe): Secondary | ICD-10-CM | POA: Diagnosis present

## 2020-06-19 DIAGNOSIS — E1140 Type 2 diabetes mellitus with diabetic neuropathy, unspecified: Secondary | ICD-10-CM | POA: Diagnosis present

## 2020-06-19 DIAGNOSIS — H409 Unspecified glaucoma: Secondary | ICD-10-CM | POA: Diagnosis present

## 2020-06-19 DIAGNOSIS — R531 Weakness: Secondary | ICD-10-CM | POA: Diagnosis not present

## 2020-06-19 DIAGNOSIS — Z833 Family history of diabetes mellitus: Secondary | ICD-10-CM | POA: Diagnosis not present

## 2020-06-19 DIAGNOSIS — N3000 Acute cystitis without hematuria: Secondary | ICD-10-CM | POA: Diagnosis present

## 2020-06-19 DIAGNOSIS — E876 Hypokalemia: Secondary | ICD-10-CM | POA: Diagnosis present

## 2020-06-19 DIAGNOSIS — I350 Nonrheumatic aortic (valve) stenosis: Secondary | ICD-10-CM | POA: Diagnosis present

## 2020-06-19 DIAGNOSIS — I255 Ischemic cardiomyopathy: Secondary | ICD-10-CM | POA: Diagnosis present

## 2020-06-19 DIAGNOSIS — Z951 Presence of aortocoronary bypass graft: Secondary | ICD-10-CM | POA: Diagnosis not present

## 2020-06-19 DIAGNOSIS — Z7902 Long term (current) use of antithrombotics/antiplatelets: Secondary | ICD-10-CM | POA: Diagnosis not present

## 2020-06-19 DIAGNOSIS — E785 Hyperlipidemia, unspecified: Secondary | ICD-10-CM | POA: Diagnosis present

## 2020-06-19 DIAGNOSIS — I130 Hypertensive heart and chronic kidney disease with heart failure and stage 1 through stage 4 chronic kidney disease, or unspecified chronic kidney disease: Secondary | ICD-10-CM | POA: Diagnosis present

## 2020-06-19 DIAGNOSIS — N179 Acute kidney failure, unspecified: Secondary | ICD-10-CM | POA: Diagnosis present

## 2020-06-19 DIAGNOSIS — N281 Cyst of kidney, acquired: Secondary | ICD-10-CM | POA: Diagnosis present

## 2020-06-19 DIAGNOSIS — Z20822 Contact with and (suspected) exposure to covid-19: Secondary | ICD-10-CM | POA: Diagnosis present

## 2020-06-19 DIAGNOSIS — K862 Cyst of pancreas: Secondary | ICD-10-CM | POA: Diagnosis present

## 2020-06-19 DIAGNOSIS — I5022 Chronic systolic (congestive) heart failure: Secondary | ICD-10-CM | POA: Diagnosis present

## 2020-06-19 DIAGNOSIS — I2510 Atherosclerotic heart disease of native coronary artery without angina pectoris: Secondary | ICD-10-CM | POA: Diagnosis present

## 2020-06-19 DIAGNOSIS — I480 Paroxysmal atrial fibrillation: Secondary | ICD-10-CM | POA: Diagnosis present

## 2020-06-19 DIAGNOSIS — Z7982 Long term (current) use of aspirin: Secondary | ICD-10-CM | POA: Diagnosis not present

## 2020-06-19 LAB — CBC
HCT: 43 % (ref 36.0–46.0)
Hemoglobin: 14.3 g/dL (ref 12.0–15.0)
MCH: 29.6 pg (ref 26.0–34.0)
MCHC: 33.3 g/dL (ref 30.0–36.0)
MCV: 89 fL (ref 80.0–100.0)
Platelets: 171 10*3/uL (ref 150–400)
RBC: 4.83 MIL/uL (ref 3.87–5.11)
RDW: 15.5 % (ref 11.5–15.5)
WBC: 5.4 10*3/uL (ref 4.0–10.5)
nRBC: 0 % (ref 0.0–0.2)

## 2020-06-19 LAB — BASIC METABOLIC PANEL
Anion gap: 11 (ref 5–15)
BUN: 30 mg/dL — ABNORMAL HIGH (ref 8–23)
CO2: 28 mmol/L (ref 22–32)
Calcium: 9.8 mg/dL (ref 8.9–10.3)
Chloride: 103 mmol/L (ref 98–111)
Creatinine, Ser: 2.39 mg/dL — ABNORMAL HIGH (ref 0.44–1.00)
GFR calc Af Amer: 21 mL/min — ABNORMAL LOW (ref >60)
GFR calc non Af Amer: 18 mL/min — ABNORMAL LOW (ref >60)
Glucose, Bld: 97 mg/dL (ref 70–99)
Potassium: 3.1 mmol/L — ABNORMAL LOW (ref 3.5–5.1)
Sodium: 142 mmol/L (ref 135–145)

## 2020-06-19 LAB — GLUCOSE, CAPILLARY
Glucose-Capillary: 100 mg/dL — ABNORMAL HIGH (ref 70–99)
Glucose-Capillary: 166 mg/dL — ABNORMAL HIGH (ref 70–99)
Glucose-Capillary: 96 mg/dL (ref 70–99)
Glucose-Capillary: 118 mg/dL — ABNORMAL HIGH (ref 70–99)

## 2020-06-19 MED ORDER — POTASSIUM CHLORIDE CRYS ER 20 MEQ PO TBCR
40.0000 meq | EXTENDED_RELEASE_TABLET | Freq: Once | ORAL | Status: AC
  Administered 2020-06-19: 40 meq via ORAL
  Filled 2020-06-19: qty 2

## 2020-06-19 NOTE — Progress Notes (Signed)
AF noted in history Patient is stable

## 2020-06-19 NOTE — Progress Notes (Addendum)
PROGRESS NOTE    [name] [name]  CNO:[id number] DOB: December 24, 1934 DOA: 06/17/2020 PCP: Kirk Ruths, MD    Brief Narrative:  [name] [name] a 85 y.o.femalewith medical history significant forCAD status post CABG, mild to moderate aortic stenosis, bioprosthetic mitral valve replacement, systolic CHF secondary to ischemic cardiomyopathy, EF 25%, CKD 4, paroxysmal A. fib not on anticoagulation, HTN and diabetes who presents with a 2-day history of increasing fatigue resulting in her sleeping a lot staying in bed and having difficulty ambulating, using her cane more than usual.Urinalysis strongly consistent with UTI.  She had an abdominal CT that showed findings consistent with cystitis, and also showed stable right renal cysts and pancreatic cyst.  Chest x-ray showed no acute disease.  CT head no acute disease.  Patient started on IV Rocephin.  Hospitalist consulted for admission.   Consultants:     Procedures:   Antimicrobials:   Rocephin   Subjective: Patient seen and examined.  She is sitting in chair.  Has no complaints.  Denies dizziness, abdominal pain, nausea or vomiting.  She has not ambulated waiting for physical therapy a.m.  Objective: Vitals:   06/19/20 0825 06/19/20 1139 06/19/20 1558 06/19/20 1600  BP: (!) 116/96 117/83 (!) 136/108 (!) 142/97  Pulse: (!) 120 85 (!) 107 (!) 113  Resp: 18 18 18    Temp: 97.9 F (36.6 C) 98.4 F (36.9 C)    TempSrc:  Oral    SpO2: 99% 99% 100%   Weight:      Height:        Intake/Output Summary (Last 24 hours) at 06/19/2020 1608 Last data filed at 06/19/2020 1345 Gross per 24 hour  Intake 720 ml  Output 0 ml  Net 720 ml   Filed Weights   06/17/20 1253 06/18/20 0507 06/19/20 0412  Weight: 74.8 kg 74.9 kg 75.7 kg    Examination:  General exam: Appears calm and comfortable, sitting in chair Respiratory system: Clear to auscultation. Respiratory effort normal.  No wheeze rales rhonchi's Cardiovascular system:  S1 & S2 heard, RRR. No JVD, murmurs, rubs, gallops or clicks. No pedal edema. Gastrointestinal system: Abdomen is nondistended, soft and nontender. Normal bowel sounds heard. Central nervous system: Alert and oriented.  Grossly intact Extremities: No edema or cyanosis Skin: Warm dry Psychiatry: Judgement and insight appear normal. Mood & affect appropriate in current setting.     Data Reviewed: I have personally reviewed following labs and imaging studies  CBC: Recent Labs  Lab 06/16/20 2109 06/19/20 0548  WBC 7.5 5.4  HGB 15.4* 14.3  HCT 45.5 43.0  MCV 88.3 89.0  PLT 200 294   Basic Metabolic Panel: Recent Labs  Lab 06/16/20 2109 06/17/20 0601 06/17/20 0903 06/18/20 0633 06/19/20 0548  NA 139  --  141 142 142  K 2.9* 3.0* 3.4* 2.9* 3.1*  CL 99  --  100 102 103  CO2 28  --  28 28 28   GLUCOSE 126*  --  125* 116* 97  BUN 34*  --  34* 34* 30*  CREATININE 3.22*  --  2.98* 2.61* 2.39*  CALCIUM 10.1  --  9.6 9.9 9.8  MG 3.5*  --   --   --   --    GFR: Estimated Creatinine Clearance: 19.3 mL/min (A) (by C-G formula based on SCr of 2.39 mg/dL (H)). Liver Function Tests: Recent Labs  Lab 06/16/20 2109  AST 24  ALT 17  ALKPHOS 63  BILITOT 2.4*  PROT 5.8*  ALBUMIN  3.2*   No results for input(s): LIPASE, AMYLASE in the last 168 hours. No results for input(s): AMMONIA in the last 168 hours. Coagulation Profile: No results for input(s): INR, PROTIME in the last 168 hours. Cardiac Enzymes: No results for input(s): CKTOTAL, CKMB, CKMBINDEX, TROPONINI in the last 168 hours. BNP (last 3 results) No results for input(s): PROBNP in the last 8760 hours. HbA1C: Recent Labs    06/17/20 0601  HGBA1C 7.3*   CBG: Recent Labs  Lab 06/18/20 1156 06/18/20 1625 06/18/20 2132 06/19/20 0828 06/19/20 1142  GLUCAP 139* 113* 114* 100* 166*   Lipid Profile: No results for input(s): CHOL, HDL, LDLCALC, TRIG, CHOLHDL, LDLDIRECT in the last 72 hours. Thyroid Function  Tests: No results for input(s): TSH, T4TOTAL, FREET4, T3FREE, THYROIDAB in the last 72 hours. Anemia Panel: No results for input(s): VITAMINB12, FOLATE, FERRITIN, TIBC, IRON, RETICCTPCT in the last 72 hours. Sepsis Labs: No results for input(s): PROCALCITON, LATICACIDVEN in the last 168 hours.  Recent Results (from the past 240 hour(s))  SARS Coronavirus 2 by RT PCR (hospital order, performed in [facility] hospital lab) Nasopharyngeal Nasopharyngeal Swab     Status: None   Collection Time: 06/17/20  3:22 AM   Specimen: Nasopharyngeal Swab  Result Value Ref Range Status   SARS Coronavirus 2 NEGATIVE NEGATIVE Final    Comment: (NOTE) SARS-CoV-2 target nucleic acids are NOT DETECTED.  The SARS-CoV-2 RNA is generally detectable in upper and lower respiratory specimens during the acute phase of infection. The lowest concentration of SARS-CoV-2 viral copies this assay can detect is 250 copies / mL. A negative result does not preclude SARS-CoV-2 infection and should not be used as the sole basis for treatment or other patient management decisions.  A negative result may occur with improper specimen collection / handling, submission of specimen other than nasopharyngeal swab, presence of viral mutation(s) within the areas targeted by this assay, and inadequate number of viral copies (<250 copies / mL). A negative result must be combined with clinical observations, patient history, and epidemiological information.  Fact Sheet for Patients:   StrictlyIdeas.no  Fact Sheet for Healthcare Providers: BankingDealers.co.za  This test is not yet approved or  cleared by the [place] FDA and has been authorized for detection and/or diagnosis of SARS-CoV-2 by FDA under an Emergency Use Authorization (EUA).  This EUA will remain in effect (meaning this test can be used) for the duration of the COVID-19 declaration under Section 564(b)(1) of the  Act, 21 U.S.C. section 360bbb-3(b)(1), unless the authorization is terminated or revoked sooner.  Performed at [facility], [address], [place], [place] [address]          Radiology Studies: ECHOCARDIOGRAM COMPLETE  Result Date: 06/18/2020    ECHOCARDIOGRAM REPORT   Patient Name:   [name] CASHAW Date of Exam: 06/18/2020 Medical Rec #:  [id number]         Height:       67.0 in Accession #:    [id number]        Weight:       165.2 lb Date of Birth:  April 09, 1935          BSA:          1.864 m Patient Age:    52 years          BP:           120/80 mmHg Patient Gender: F  HR:           99 bpm. Exam Location:  [facility] Procedure: 2D Echo, Color Doppler and Cardiac Doppler Indications:     Elevated troponin  History:         Patient has prior history of Echocardiogram examinations. CAD,                  Prior CABG; Risk Factors:Hypertension, Dyslipidemia and                  Diabetes. Bioprosthetic MVR.  Sonographer:     Charmayne Sheer RDCS (AE) Referring Phys:  [id number] [name] Keland Peyton Diagnosing Phys: Isaias Cowman MD IMPRESSIONS  1. Left ventricular ejection fraction, by estimation, is 40 to 45%. The left ventricle has mildly decreased function. The left ventricle has no regional wall motion abnormalities. Left ventricular diastolic parameters are indeterminate.  2. Right ventricular systolic function is normal. The right ventricular size is normal. There is moderately elevated pulmonary artery systolic pressure.  3. The mitral valve is normal in structure. Moderate mitral valve regurgitation. No evidence of mitral stenosis.  4. Tricuspid valve regurgitation is moderate.  5. The aortic valve is normal in structure. Aortic valve regurgitation is mild. No aortic stenosis is present.  6. The inferior vena cava is normal in size with greater than 50% respiratory variability, suggesting right atrial pressure of 3 mmHg. FINDINGS  Left Ventricle: Left ventricular ejection fraction, by  estimation, is 40 to 45%. The left ventricle has mildly decreased function. The left ventricle has no regional wall motion abnormalities. The left ventricular internal cavity size was normal in size. There is no left ventricular hypertrophy. Left ventricular diastolic parameters are indeterminate. Right Ventricle: The right ventricular size is normal. No increase in right ventricular wall thickness. Right ventricular systolic function is normal. There is moderately elevated pulmonary artery systolic pressure. The tricuspid regurgitant velocity is 3.00 m/s, and with an assumed right atrial pressure of 10 mmHg, the estimated right ventricular systolic pressure is 25.4 mmHg. Left Atrium: Left atrial size was normal in size. Right Atrium: Right atrial size was normal in size. Pericardium: There is no evidence of pericardial effusion. Mitral Valve: The mitral valve is normal in structure. Normal mobility of the mitral valve leaflets. Moderate mitral valve regurgitation. No evidence of mitral valve stenosis. MV peak gradient, 18.5 mmHg. The mean mitral valve gradient is 10.0 mmHg. Tricuspid Valve: The tricuspid valve is normal in structure. Tricuspid valve regurgitation is moderate . No evidence of tricuspid stenosis. Aortic Valve: The aortic valve is normal in structure. Aortic valve regurgitation is mild. No aortic stenosis is present. Aortic valve mean gradient measures 9.0 mmHg. Aortic valve peak gradient measures 16.0 mmHg. Aortic valve area, by VTI measures 1.31  cm. Pulmonic Valve: The pulmonic valve was normal in structure. Pulmonic valve regurgitation is not visualized. No evidence of pulmonic stenosis. Aorta: The aortic root is normal in size and structure. Venous: The inferior vena cava is normal in size with greater than 50% respiratory variability, suggesting right atrial pressure of 3 mmHg. IAS/Shunts: No atrial level shunt detected by color flow Doppler.  LEFT VENTRICLE PLAX 2D LVIDd:         5.15 cm   Diastology LVIDs:         4.08 cm  LV e' lateral:   4.46 cm/s LV PW:         0.94 cm  LV E/e' lateral: 47.8 LV IVS:        0.99  cm LVOT diam:     1.90 cm LV SV:         44 LV SV Index:   23 LVOT Area:     2.84 cm  RIGHT VENTRICLE RV Basal diam:  3.78 cm LEFT ATRIUM              Index       RIGHT ATRIUM           Index LA diam:        4.00 cm  2.15 cm/m  RA Area:     16.80 cm LA Vol (A2C):   92.6 ml  49.67 ml/m RA Volume:   41.30 ml  22.15 ml/m LA Vol (A4C):   104.0 ml 55.78 ml/m LA Biplane Vol: 105.0 ml 56.32 ml/m  AORTIC VALVE                    PULMONIC VALVE AV Area (Vmax):    1.14 cm     PV Vmax:       0.62 m/s AV Area (Vmean):   1.16 cm     PV Vmean:      42.600 cm/s AV Area (VTI):     1.31 cm     PV VTI:        0.084 m AV Vmax:           200.00 cm/s  PV Peak grad:  1.5 mmHg AV Vmean:          136.000 cm/s PV Mean grad:  1.0 mmHg AV VTI:            0.334 m AV Peak Grad:      16.0 mmHg AV Mean Grad:      9.0 mmHg LVOT Vmax:         80.40 cm/s LVOT Vmean:        55.500 cm/s LVOT VTI:          0.154 m LVOT/AV VTI ratio: 0.46  AORTA Ao Root diam: 3.20 cm MITRAL VALVE                TRICUSPID VALVE MV Area (PHT): 2.59 cm     TR Peak grad:   36.0 mmHg MV Peak grad:  18.5 mmHg    TR Vmax:        300.00 cm/s MV Mean grad:  10.0 mmHg MV Vmax:       2.15 m/s     SHUNTS MV Vmean:      155.0 cm/s   Systemic VTI:  0.15 m MV Decel Time: 293 msec     Systemic Diam: 1.90 cm MV E velocity: 213.00 cm/s Isaias Cowman MD Electronically signed by Isaias Cowman MD Signature Date/Time: 06/18/2020/1:04:32 PM    Final         Scheduled Meds:  heparin  5,000 Units Subcutaneous Q8H   insulin aspart  0-15 Units Subcutaneous TID WC   insulin aspart  0-5 Units Subcutaneous QHS   Continuous Infusions:  cefTRIAXone (ROCEPHIN)  IV 1 g (06/18/20 1940)    Assessment & Plan:   Principal Problem:   Generalized weakness Active Problems:   [name]   Acute kidney injury superimposed on CKD  stage IV(HCC)   Type 2 diabetes mellitus with complication (HCC)   Essential hypertension   [name]   PAF (paroxysmal atrial fibrillation) (HCC)   Chronic systolic CHF (congestive heart failure) (HCC)   Diabetic neuropathy associated with type 2  diabetes mellitus ([place])   UTI (urinary tract infection)   Hypokalemia   Aortic stenosis   History of mitral valve replacement with bioprosthetic valve   Elevated troponin   Abnormal CT of the abdomen  Generalized weakness -Suspect multifactorial related to secondary to UTI, AKI superimposed on her multiple chronic medical conditions -Treat etiologies as outlined below -Physical therapy evaluation -home PT    UTI (urinary tract infection) -Urinalysis consistent with UTI and abdominal CT showing cystitis -Continue IV Rocephin -Follow cultures not shown yet    Hypokalemia -still low after repalcement. Today 3.1. -Possibly related to home diuretics -Continue to monitor Will replace KCL today , ck am labs    Acute kidney injury superimposed on CKD stage IV(HCC) -Creatinine elevated at 3.22 above baseline of 1.74 -Likely prerenal related to diuretic use Improving slowly, creatinine 2.4 now. If continues to decrease in am, can likely dc with f/u bmp  ivf d/c;d to prevent volume overload  Elevated troponin   [name] with history of CABG x4 -No complaints of chest pain.  Troponin 34.  EKG with nonspecific ST-T wave changes Likely demand ischemia. -Continue antiplatelets statins and beta-blockers Echo EF 40-45% better than EF from 2018.     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  Chest x-ray showed no acute disease -Monitor for decompensation in view of IV hydration being given for acute kidney injury..>ivf d/c now Lasix on hold due to aki.     Aortic stenosis, mild to moderate -Seen by cardiologist 05/14/2020.  Not currently a candidate for TAVR as not severe enough    History of mitral valve replacement with bioprosthetic valve -No acute concerns  Renal cyst on CT Pancreatic cyst on CT -Chronic stable findings "with recommendations for follow-up study        DVT prophylaxis: Heparin Code Status: full code  Family Communication:  none  Disposition Plan: Back to previous home environment Status is: Observation   Dispo: The patient is from: Home              Anticipated d/c is to: Home              Anticipated d/c date is: 1 day, in am              Patient currently is not medically stable to d/c.            LOS: 0 days   Time spent: 45 minutes with more than 50% COC    Nolberto Hanlon, MD [facility] Pager [phone]  If 7PM-7AM, please contact night-coverage [email or web address] Password [facility] 06/19/2020, 4:08 PM

## 2020-06-19 NOTE — Evaluation (Signed)
Physical Therapy Evaluation Patient Details Name: [name] [name] MRN: [id number] DOB: [birth year] Today's Date: 06/19/2020   History of Present Illness  Pt is an 85 y.o. female with medical history significant for CAD status post CABG, mild to moderate aortic stenosis, bioprosthetic mitral valve replacement, systolic CHF secondary to ischemic cardiomyopathy, EF 25%, CKD 4, paroxysmal A. fib not on anticoagulation, HTN and diabetes who presents with a 2-day history of increasing fatigue resulting in her sleeping a lot staying in bed and having difficulty ambulating, using her cane more than usual. Urinalysis strongly consistent with UTI.    Clinical Impression  Pt alert, pleasant, reported no pain. Stated at baseline she is ambulatory with a SPC for the last several months, I for IADLs, drives. No falls in the last 6 months.  The patient demonstrated bed mobility with supervision and HOB elevated. Sit <> stand with CGA/supervision with SPC. The patient was able to ambulate ~46ft with SPC and supervision/CGA. HR in 110s-momentarily 130, but returned to 120s. Pt up in chair at end of session.  Overall the patient demonstrated deficits (see "PT Problem List") that impede the patient's functional abilities, safety, and mobility and would benefit from skilled PT intervention. Recommendation is HHPT to maximize mobility, safety, and functional abilities.     Follow Up Recommendations Home health PT    Equipment Recommendations  Cane    Recommendations for Other Services       Precautions / Restrictions Precautions Precautions: None Restrictions Weight Bearing Restrictions: No      Mobility  Bed Mobility Overal bed mobility: Needs Assistance Bed Mobility: Supine to Sit     Supine to sit: HOB elevated;Supervision        Transfers Overall transfer level: Needs assistance Equipment used: Straight cane Transfers: Sit to/from Stand Sit to Stand: Min guard;Supervision             Ambulation/Gait Ambulation/Gait assistance: Min Gaffer (Feet): 90 Feet Assistive device: Straight cane   Gait velocity: decreased   General Gait Details: pt with decreased gait velocity and stride length/step. some mild tremors noted  Stairs            Wheelchair Mobility    Modified Rankin (Stroke Patients Only)       Balance Overall balance assessment: Needs assistance Sitting-balance support: Feet supported Sitting balance-Leahy Scale: Good       Standing balance-Leahy Scale: Good Standing balance comment: use of SPC                             Pertinent Vitals/Pain Pain Assessment: No/denies pain    Home Living Family/patient expects to be discharged to:: Private residence Living Arrangements: Alone Available Help at Discharge: Neighbor;Available PRN/intermittently Type of Home: Mobile home Home Access: Ramped entrance     Home Layout: One level Home Equipment: Cane - single point;Shower seat;Grab bars - tub/shower;Walker - 2 wheels Additional Comments: no falls in the last 6 months    Prior Function Level of Independence: Independent with assistive device(s)         Comments: ambulates with SPC the last several months     Hand Dominance        Extremity/Trunk Assessment   Upper Extremity Assessment Upper Extremity Assessment: Overall WFL for tasks assessed    Lower Extremity Assessment Lower Extremity Assessment: RLE deficits/detail;LLE deficits/detail RLE Deficits / Details: grossly 4+/5 LLE Deficits / Details: grossly 4+/5, pt needed cueing to achieve testing  position       Communication   Communication: No difficulties  Cognition Arousal/Alertness: Awake/alert Behavior During Therapy: WFL for tasks assessed/performed Overall Cognitive Status: Within Functional Limits for tasks assessed                                        General Comments      Exercises      Assessment/Plan    PT Assessment Patient needs continued PT services  PT Problem List Decreased strength;Decreased activity tolerance;Decreased balance;Decreased mobility       PT Treatment Interventions DME instruction;Balance training;Gait training;Neuromuscular re-education;Stair training;Functional mobility training;Patient/family education;Therapeutic activities;Therapeutic exercise    PT Goals (Current goals can be found in the Care Plan section)  Acute Rehab PT Goals Patient Stated Goal: to go home PT Goal Formulation: With patient Time For Goal Achievement: 07/03/20 Potential to Achieve Goals: Good    Frequency Min 2X/week   Barriers to discharge        Co-evaluation               AM-PAC PT "6 Clicks" Mobility  Outcome Measure Help needed turning from your back to your side while in a flat bed without using bedrails?: None Help needed moving from lying on your back to sitting on the side of a flat bed without using bedrails?: None Help needed moving to and from a bed to a chair (including a wheelchair)?: None Help needed standing up from a chair using your arms (e.g., wheelchair or bedside chair)?: None Help needed to walk in hospital room?: A Little Help needed climbing 3-5 steps with a railing? : A Little 6 Click Score: 22    End of Session Equipment Utilized During Treatment: Gait belt Activity Tolerance: Patient tolerated treatment well Patient left: in chair;with call bell/phone within reach Nurse Communication: Mobility status PT Visit Diagnosis: Other abnormalities of gait and mobility (R26.89);Muscle weakness (generalized) (M62.81)    Time: 9480-1655 PT Time Calculation (min) (ACUTE ONLY): 20 min   Charges:   PT Evaluation $PT Eval Low Complexity: 1 Low PT Treatments $Therapeutic Exercise: 8-22 mins       Lieutenant Diego PT, DPT 10:28 AM,06/19/20

## 2020-06-19 NOTE — Plan of Care (Signed)
  Problem: Education: Goal: Ability to demonstrate management of disease process will improve Outcome: Progressing Goal: Ability to verbalize understanding of medication therapies will improve Outcome: Progressing Goal: Individualized Educational Video(s) Outcome: Progressing   Problem: Activity: Goal: Capacity to carry out activities will improve Outcome: Progressing   Problem: Cardiac: Goal: Ability to achieve and maintain adequate cardiopulmonary perfusion will improve Outcome: Progressing   Problem: Urinary Elimination: Goal: Signs and symptoms of infection will decrease Outcome: Progressing   Problem: Education: Goal: Knowledge of General Education information will improve Description: Including pain rating scale, medication(s)/side effects and non-pharmacologic comfort measures Outcome: Progressing   Problem: Health Behavior/Discharge Planning: Goal: Ability to manage health-related needs will improve Outcome: Progressing   Problem: Clinical Measurements: Goal: Ability to maintain clinical measurements within normal limits will improve Outcome: Progressing Goal: Will remain free from infection Outcome: Progressing Goal: Diagnostic test results will improve Outcome: Progressing Goal: Respiratory complications will improve Outcome: Progressing Goal: Cardiovascular complication will be avoided Outcome: Progressing   Problem: Activity: Goal: Risk for activity intolerance will decrease Outcome: Progressing   Problem: Nutrition: Goal: Adequate nutrition will be maintained Outcome: Progressing   Problem: Coping: Goal: Level of anxiety will decrease Outcome: Progressing   Problem: Elimination: Goal: Will not experience complications related to bowel motility Outcome: Progressing Goal: Will not experience complications related to urinary retention Outcome: Progressing   Problem: Pain Managment: Goal: General experience of comfort will improve Outcome:  Progressing   Problem: Safety: Goal: Ability to remain free from injury will improve Outcome: Progressing   Problem: Skin Integrity: Goal: Risk for impaired skin integrity will decrease Outcome: Progressing

## 2020-06-20 DIAGNOSIS — R531 Weakness: Secondary | ICD-10-CM

## 2020-06-20 DIAGNOSIS — N189 Chronic kidney disease, unspecified: Secondary | ICD-10-CM

## 2020-06-20 DIAGNOSIS — N179 Acute kidney failure, unspecified: Principal | ICD-10-CM

## 2020-06-20 DIAGNOSIS — N3000 Acute cystitis without hematuria: Secondary | ICD-10-CM

## 2020-06-20 DIAGNOSIS — I480 Paroxysmal atrial fibrillation: Secondary | ICD-10-CM

## 2020-06-20 DIAGNOSIS — I5022 Chronic systolic (congestive) heart failure: Secondary | ICD-10-CM

## 2020-06-20 LAB — BASIC METABOLIC PANEL
Anion gap: 9 (ref 5–15)
BUN: 29 mg/dL — ABNORMAL HIGH (ref 8–23)
CO2: 26 mmol/L (ref 22–32)
Calcium: 9.9 mg/dL (ref 8.9–10.3)
Chloride: 103 mmol/L (ref 98–111)
Creatinine, Ser: 2.33 mg/dL — ABNORMAL HIGH (ref 0.44–1.00)
GFR calc Af Amer: 22 mL/min — ABNORMAL LOW (ref >60)
GFR calc non Af Amer: 19 mL/min — ABNORMAL LOW (ref >60)
Glucose, Bld: 109 mg/dL — ABNORMAL HIGH (ref 70–99)
Potassium: 3.5 mmol/L (ref 3.5–5.1)
Sodium: 138 mmol/L (ref 135–145)

## 2020-06-20 LAB — GLUCOSE, CAPILLARY
Glucose-Capillary: 108 mg/dL — ABNORMAL HIGH (ref 70–99)
Glucose-Capillary: 110 mg/dL — ABNORMAL HIGH (ref 70–99)

## 2020-06-20 MED ORDER — CEFDINIR 300 MG PO CAPS: 300.0000 mg | ORAL_CAPSULE | Freq: Every day | ORAL | 0 refills | Status: DC

## 2020-06-20 MED ORDER — POTASSIUM CHLORIDE 20 MEQ PO PACK
40.0000 meq | PACK | Freq: Once | ORAL | Status: AC
  Administered 2020-06-20: 40 meq via ORAL
  Filled 2020-06-20: qty 2

## 2020-06-20 MED ORDER — CEFDINIR 300 MG PO CAPS
300.0000 mg | ORAL_CAPSULE | Freq: Every day | ORAL | Status: DC
  Filled 2020-06-20: qty 1

## 2020-06-20 NOTE — TOC Transition Note (Signed)
Transition of Care [facility]) - CM/SW Discharge Note   Patient Details  Name: [name] [name] MRN: [id number] Date of Birth: [birth year]  [facility] CM/SW Contact:  Eileen Stanford, LCSW Phone Number: 06/20/2020, 12:28 PM   Clinical Narrative:   Pt's HH has been arranged. No additional needs at this time. Pt d/c today.    Final next level of care: Home w Home Health Services Barriers to Discharge: No Barriers Identified   Patient Goals and CMS Choice Patient states their goals for this hospitalization and ongoing recovery are:: to go home   Choice offered to / list presented to : Patient  Discharge Placement                    Patient and family notified of of transfer: 06/20/20  Discharge Plan and Services     Post Acute Care Choice: Home Health                    HH Arranged: RN, PT, Nurse's Aide [place] Agency: [facility] Date [place]: 06/20/20 Time [place]: 1228 Representative spoke with at [place]: brittany  Social Determinants of Health ([place]) Interventions     Readmission Risk Interventions No flowsheet data found.

## 2020-06-20 NOTE — Discharge Summary (Signed)
Physician Discharge Summary  Patient ID: [name] [name] MRN: [id number] DOB/AGE: 1935-02-04 85 y.o.  Admit date: 06/17/2020 Discharge date: 06/20/2020  Admission Diagnoses:  Discharge Diagnoses:  Principal Problem:   Generalized weakness Active Problems:   [name]   Acute kidney injury superimposed on CKD stage IV(HCC)   Type 2 diabetes mellitus with complication (HCC)   Essential hypertension   [name]   PAF (paroxysmal atrial fibrillation) (HCC)   Chronic systolic CHF (congestive heart failure) (HCC)   Diabetic neuropathy associated with type 2 diabetes mellitus (HCC)   UTI (urinary tract infection)   Hypokalemia   Aortic stenosis   History of mitral valve replacement with bioprosthetic valve   Elevated troponin   Abnormal CT of the abdomen   Discharged Condition: good  Hospital Course:   [name] [name] a 84 y.o.femalewith medical history significant forCAD status post CABG, mild to moderate aortic stenosis, bioprosthetic mitral valve replacement, systolic CHF secondary to ischemic cardiomyopathy, EF 25%, CKD 4, paroxysmal A. fib not on anticoagulation, HTN and diabetes who presents with a 2-day history of increasing fatigue resulting in her sleeping a lot staying in bed and having difficulty ambulating, using her cane more than usual.Urinalysis strongly consistent with UTI. She had an abdominal CT that showed findings consistent with cystitis, and also showed stable right renal cysts and pancreatic cyst. Chest x-ray showed no acute disease. CT head no acute disease. Patient started on IV Rocephin. Hospitalist consulted for admission.  Condition had improved, flank pain has resolved.  No urine symptoms.  Urine culture was not sent out at admission.  At this point, she has been treated with 3 days of Rocephin, condition had improved.  I will continue 2 more days of cefdinir. Patient also had elevated BNP at time admission, but did not have any  short of breath suggesting exacerbation of congestive heart failure.  Repeat echocardiogram showed ejection fraction 40 to 45%.  I resume patient home dose of diuretics.  #1.  UTI. CT showed evidence of cystitis.  Condition has improved.  2.  Hypokalemia.  Condition improved.  Continue supplement as outpatient.  3.  Acute kidney injury superimposed on chronic kidney disease stage IV. Renal function has back to baseline.  4.  Elevated troponin. Secondary to acute illness.  Echo cardiogram showed ejection fraction the same as 2018.  5.  Essential hypertension. Resume home medicines  6.  Paroxysmal defibrillation. Not chronically on anticoagulation.  7.  Chronic systolic congestive heart failure. Stable.  Renal function back to baseline, resume home dose diuretics.  8.  Aortic stenosis. Follow-up with cardiology.   Consults: None  Significant Diagnostic Studies:  CT ABDOMEN AND PELVIS WITHOUT CONTRAST  TECHNIQUE: Multidetector CT imaging of the abdomen and pelvis was performed following the standard protocol without IV contrast.  COMPARISON:  CT 07/22/2015, MRI 05/16/2020 renal ultrasound 02/21/2020  FINDINGS: Lower chest: Coarse reticular changes present in the lung bases with an area of bandlike opacity in left lung base likely reflecting subsegmental atelectasis or scarring. No focal consolidative opacity or effusion. Cardiomegaly is present extensive coronary artery calcifications. Evidence of prior mitral valve replacement. Dense calcifications on the aortic leaflets. Distal thoracic aortic calcification noted as well.  Hepatobiliary: No visible focal liver lesions. Punctate calcifications seen in the left lobe liver, stable from prior, may reflect sequela of prior granulomatous disease. Dependently layering calcifications noted in the gallbladder. No pericholecystic fluid or inflammation. No biliary ductal dilatation or calcified  intraductal gallstones.  Pancreas: Small cystic focus in the head of the pancreas measuring 6 mm (2/32). Additional cystic focus towards the tail of the pancreas measuring 7 mm similar in appearance to comparison CT (2/26). No inflammation or ductal dilatation.  Spleen: Normal in size without focal abnormality.  Adrenals/Urinary Tract: No concerning adrenal lesions. Bilateral areas of cortical scarring. Large complex multi septate cystic structure in the left kidney measuring up to 7 cm by 3.9 cm by 6.9 cm unchanged in comparison to prior exams. Additional fluid attenuation cysts present in both kidneys. A smaller hyperdense cyst is seen in the lower pole right kidney, unchanged from comparison some likely benign hyperdense renal cyst. No obstructive urolithiasis. Multiple nonobstructing calculi present in the lower pole right kidney. Bladder decompressed circumferential bladder wall thickening and edematous changes of the bladder wall on a background of more chronic intramural fatty infiltration  Stomach/Bowel: Small sliding-type hiatal hernia. Gastric wall thickening likely related to underdistention. Duodenum is unremarkable. No small bowel thickening or dilatation. The appendix is surgically absent. No colonic dilatation or wall thickening. Scattered colonic diverticula without focal inflammation to suggest diverticulitis.  Vascular/Lymphatic: Atherosclerotic calcifications throughout the abdominal aorta and branch vessels. No aneurysm or ectasia. No enlarged abdominopelvic lymph nodes.  Reproductive: Heterogeneous, lobular uterus compatible with uterine fibroids, in similar configuration to the comparison study. No new concerning adnexal lesions.  Other: Mild body wall edema. No abdominopelvic free air or fluid. No bowel containing hernia. Small fat containing umbilical hernia.  Musculoskeletal: Multilevel degenerative changes are present in the imaged portions  of the spine. Additional degenerative changes in the hips and pelvis. The osseous structures appear diffusely demineralized which may limit detection of small or nondisplaced fractures. No acute osseous abnormality or suspicious osseous lesion.  IMPRESSION: 1. Bladder decompressed but with with circumferential bladder wall thickening greater than expected for underdistention and edematous changes of the bladder wall on a background of more chronic intramural fatty infiltration. Correlate with urinalysis to exclude cystitis. 2. 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 Probable hyperdense cyst in the lower pole right kidney as well. 3. Nonobstructing right nephrolithiasis. No obstructive urolithiasis or hydronephrosis. 4. Stable appearance of a 7 mm cystic focus in the tail the pancreas but with new 7 mm cystic structure seen at the pancreatic head. Consider follow-up pancreatic protocol MRI/MRCP in 1 year which would align with patient's follow-up for the lesion in the tail. This recommendation follows ACR consensus guidelines: Management of Incidental Pancreatic Cysts: A White Paper of the ACR Incidental Findings Committee. [place] 8127;51:700-174. 5. Cholelithiasis without evidence of acute cholecystitis. 6. Colonic diverticulosis without evidence of diverticulitis. 7. Fibroid uterus. 8. Aortic Atherosclerosis (ICD10-I70.0).   Electronically Signed   By: Lovena Le M.D.   On: 06/17/2020 02:41   Echo: 1. Left ventricular ejection fraction, by estimation, is 40 to 45%. The left ventricle has mildly decreased function. The left ventricle has no regional wall motion abnormalities. Left ventricular diastolic parameters are indeterminate. 2. Right ventricular systolic function is normal. The right ventricular size is normal. There  is moderately elevated pulmonary artery systolic pressure. 3. The mitral valve is normal in structure. Moderate mitral valve regurgitation. No evidence of mitral stenosis. 4. Tricuspid valve regurgitation is moderate. 5. The aortic valve is normal in structure. Aortic valve regurgitation is mild. No aortic stenosis is present. 6. The inferior vena cava is normal in  size with greater than 50% respiratory variability, suggesting right atrial pressure of 3 mmHg.   Treatments: antibiotics: ceftriaxone  Discharge Exam: Blood pressure 128/87, pulse 72, temperature 98.4 F (36.9 C), temperature source Oral, resp. rate 18, height 5\' 7"  (1.702 m), weight 76.1 kg, SpO2 100 %. General appearance: alert and cooperative Resp: clear to auscultation bilaterally Cardio: regular rate and rhythm, S1, S2 normal, no murmur, click, rub or gallop GI: soft, non-tender; bowel sounds normal; no masses,  no organomegaly Extremities: extremities normal, atraumatic, no cyanosis or edema  Disposition: Discharge disposition: 01-Home or Self Care       Discharge Instructions    Diet - low sodium heart healthy   Complete by: As directed    Increase activity slowly   Complete by: As directed      Allergies as of 06/20/2020   No Known Allergies     Medication List    TAKE these medications   aspirin EC 81 MG tablet Take 1 tablet (81 mg total) by mouth daily.   carvedilol 12.5 MG tablet Commonly known as: COREG Take 12.5 mg by mouth 2 (two) times daily.   cefdinir 300 MG capsule Commonly known as: OMNICEF Take 1 capsule (300 mg total) by mouth daily.   dorzolamide-timolol 22.3-6.8 MG/ML ophthalmic solution Commonly known as: COSOPT Place 1 drop into both eyes 2 (two) times daily.   Entresto 49-51 MG Generic drug: sacubitril-valsartan Take 1 tablet by mouth 2 (two) times daily.   isosorbide mononitrate 30 MG 24 hr tablet Commonly known as: IMDUR Take 1 tablet by mouth once daily    latanoprost 0.005 % ophthalmic solution Commonly known as: XALATAN Place 1 drop into both eyes at bedtime.   Magnesium 250 MG Tabs Take 250 mg by mouth daily.   Potassium Chloride ER 20 MEQ Tbcr TAKE 1 TABLET BY MOUTH ONCE DAILY   rosuvastatin 40 MG tablet Commonly known as: CRESTOR Take 40 mg by mouth every evening.   torsemide 20 MG tablet Commonly known as: DEMADEX Take 2 tablets (40 mg total) by mouth daily. What changed: how much to take       Follow-up Information    Kirk Ruths, MD Follow up in 1 week(s).   Specialty: Internal Medicine Contact information: [facility] [place] [address] [phone]               Signed: Sharen Hones 06/20/2020, 11:57 AM

## 2020-06-20 NOTE — Discharge Instructions (Signed)
Follow up with PCP in 1 week. Follow up with your cardiologist in 1 to 2 weeks.

## 2020-06-20 NOTE — TOC Initial Note (Signed)
Transition of Care [facility]) - Initial/Assessment Note    Patient Details  Name: [name] [name] MRN: [id number] Date of Birth: [birth year]  Transition of Care [facility]) CM/SW Contact:    Eileen Stanford, LCSW Phone Number: 06/20/2020, 9:58 AM  Clinical Narrative:  Pt lives home alone. Pt is agreeable to [facility]. Pt does not have a agency preference. Pt has a walker and cane at home.   [facility] has agreed to service pt.                 Expected Discharge Plan: [place] Barriers to Discharge: Continued Medical Work up   Patient Goals and CMS Choice Patient states their goals for this hospitalization and ongoing recovery are:: to go home   Choice offered to / list presented to : Patient  Expected Discharge Plan and Services Expected Discharge Plan: [place] Acute Care Choice: [place] arrangements for the past 2 months: [place] Agency: [facility] Date [place]: 06/20/20 Time [place]: 1638 Representative spoke with at [place]: [place]  Prior Living Arrangements/Services Living arrangements for the past 2 months: [place] with:: Self Patient language and need for interpreter reviewed:: Yes Do you feel safe going back to the place where you live?: Yes      Need for Family Participation in Patient Care: Yes (Comment) Care giver support system in place?: Yes (comment)   Criminal Activity/Legal Involvement Pertinent to Current Situation/Hospitalization: No - Comment as needed  Activities of Daily Living Home Assistive Devices/Equipment: Cane (specify quad or straight) ADL Screening (condition at time of admission) Patient's cognitive ability adequate to safely complete daily activities?: Yes Is the patient deaf or have difficulty hearing?: No Does the patient have difficulty seeing, even when wearing glasses/contacts?: No Does the  patient have difficulty concentrating, remembering, or making decisions?: No Patient able to express need for assistance with ADLs?: Yes Does the patient have difficulty dressing or bathing?: No Independently performs ADLs?: Yes (appropriate for developmental age) Does the patient have difficulty walking or climbing stairs?: No Weakness of Legs: None Weakness of Arms/Hands: None  Permission Sought/Granted Permission sought to share information with : Family Supports Permission granted to share information with : Yes, Verbal Permission Granted  Share Information with NAME: Alfonse Spruce  Permission granted to share info w AGENCY: [facility]  Permission granted to share info w Relationship: daughter     Emotional Assessment Appearance:: Appears stated age Attitude/Demeanor/Rapport: Engaged Affect (typically observed): Accepting, Appropriate Orientation: : Oriented to Situation, Oriented to  Time, Oriented to Place, Oriented to Self Alcohol / Substance Use: Not Applicable Psych Involvement: No (comment)  Admission diagnosis:  Hypokalemia [E87.6] Weakness [R53.1] Acute cystitis without hematuria [N30.00] Aortic valve stenosis, etiology of cardiac valve disease unspecified [I35.0] Patient Active Problem List   Diagnosis Date Noted  . Generalized weakness 06/17/2020  . UTI (urinary tract infection) 06/17/2020  . Hypokalemia 06/17/2020  . Aortic stenosis 06/17/2020  . History of mitral valve replacement with bioprosthetic valve 06/17/2020  . Elevated troponin 06/17/2020  . Abnormal CT of the abdomen 06/17/2020  . Pain in limb 06/28/2019  . Chronic superficial gastritis without bleeding 01/06/2019  . Use of cane as ambulatory aid 11/11/2018  . Falls frequently 10/21/2018  .  Chronic systolic CHF (congestive heart failure) ([place]) 02/17/2018  . Hypercalcemia 06/28/2017  . PAF (paroxysmal atrial fibrillation) ([place]) 03/26/2017  . CKD (chronic kidney disease), stage IV ([place]) 03/26/2017  .  [name]   . Bilateral carpal tunnel syndrome 02/06/2017  . [name] 02/05/2017  . Unstable angina ([place]) 02/03/2017  . [name] 02/03/2017  . Murmur 02/03/2017  . Acute kidney injury superimposed on CKD stage IV(HCC) 02/03/2017  . Type 2 diabetes mellitus with complication ([place]) 79/43/2761  . Essential hypertension 02/03/2017  . 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 HLD (hyperlipidemia) 04/16/2016  . Diabetic neuropathy associated with type 2 diabetes mellitus ([place]) 04/16/2016  . Absolute anemia 09/19/2014  . Encounter for screening colonoscopy 09/19/2014   PCP:  Kirk Ruths, MD Pharmacy:   [facility] [address], [place] - [place] [address] [facility] [address] Phone: [phone] Fax: [place], [place] [address] Phone: [phone] Fax: [phone]     Social Determinants of Health (SDOH) Interventions    Readmission Risk Interventions No flowsheet data found.

## 2020-06-20 NOTE — Progress Notes (Signed)
[name] [name] to be D/C'd Home per MD order.  Discussed prescriptions and follow up appointments with the patient. Prescriptions given to patient, medication list explained in detail. Pt verbalized understanding.  Allergies as of 06/20/2020   No Known Allergies     Medication List    TAKE these medications   aspirin EC 81 MG tablet Take 1 tablet (81 mg total) by mouth daily.   carvedilol 12.5 MG tablet Commonly known as: COREG Take 12.5 mg by mouth 2 (two) times daily.   cefdinir 300 MG capsule Commonly known as: OMNICEF Take 1 capsule (300 mg total) by mouth daily.   dorzolamide-timolol 22.3-6.8 MG/ML ophthalmic solution Commonly known as: COSOPT Place 1 drop into both eyes 2 (two) times daily.   Entresto 49-51 MG Generic drug: sacubitril-valsartan Take 1 tablet by mouth 2 (two) times daily.   isosorbide mononitrate 30 MG 24 hr tablet Commonly known as: IMDUR Take 1 tablet by mouth once daily   latanoprost 0.005 % ophthalmic solution Commonly known as: XALATAN Place 1 drop into both eyes at bedtime.   Magnesium 250 MG Tabs Take 250 mg by mouth daily.   Potassium Chloride ER 20 MEQ Tbcr TAKE 1 TABLET BY MOUTH ONCE DAILY   rosuvastatin 40 MG tablet Commonly known as: CRESTOR Take 40 mg by mouth every evening.   torsemide 20 MG tablet Commonly known as: DEMADEX Take 2 tablets (40 mg total) by mouth daily. What changed: how much to take       Vitals:   06/20/20 0716 06/20/20 1215  BP: 128/87 111/90  Pulse: 72 91  Resp: 18 18  Temp: 98.4 F (36.9 C) 98.1 F (36.7 C)  SpO2: 100% 99%    Tele box removed and returned.Skin clean, dry and intact without evidence of skin break down, no evidence of skin tears noted. IV catheter discontinued intact. Site without signs and symptoms of complications. Dressing and pressure applied. Pt denies pain at this time. No complaints noted.  An After Visit Summary was printed and given to the patient. Patient escorted  via [place], and D/C home via private auto.  Rolley Sims

## 2020-06-27 DIAGNOSIS — R319 Hematuria, unspecified: Secondary | ICD-10-CM | POA: Insufficient documentation

## 2020-06-27 DIAGNOSIS — R600 Localized edema: Secondary | ICD-10-CM | POA: Insufficient documentation

## 2020-06-27 DIAGNOSIS — E8770 Fluid overload, unspecified: Secondary | ICD-10-CM | POA: Insufficient documentation

## 2020-06-27 DIAGNOSIS — R809 Proteinuria, unspecified: Secondary | ICD-10-CM | POA: Insufficient documentation

## 2020-07-13 ENCOUNTER — Encounter: Payer: Self-pay | Admitting: Urology

## 2020-07-13 ENCOUNTER — Ambulatory Visit: Payer: Self-pay | Admitting: Urology

## 2020-07-19 ENCOUNTER — Inpatient Hospital Stay
Admission: EM | Admit: 2020-07-19 | Discharge: 2020-07-21 | DRG: 291 | Disposition: A | Discharge status: Home / Self Care | Payer: Medicare HMO | Attending: Internal Medicine | Admitting: Internal Medicine

## 2020-07-19 ENCOUNTER — Other Ambulatory Visit: Payer: Self-pay

## 2020-07-19 ENCOUNTER — Emergency Department: Payer: Medicare HMO

## 2020-07-19 DIAGNOSIS — N179 Acute kidney failure, unspecified: Secondary | ICD-10-CM | POA: Diagnosis not present

## 2020-07-19 DIAGNOSIS — Z20822 Contact with and (suspected) exposure to covid-19: Secondary | ICD-10-CM | POA: Diagnosis present

## 2020-07-19 DIAGNOSIS — Z7982 Long term (current) use of aspirin: Secondary | ICD-10-CM

## 2020-07-19 DIAGNOSIS — N186 End stage renal disease: Secondary | ICD-10-CM | POA: Diagnosis not present

## 2020-07-19 DIAGNOSIS — D696 Thrombocytopenia, unspecified: Secondary | ICD-10-CM | POA: Diagnosis present

## 2020-07-19 DIAGNOSIS — E1122 Type 2 diabetes mellitus with diabetic chronic kidney disease: Secondary | ICD-10-CM | POA: Diagnosis present

## 2020-07-19 DIAGNOSIS — Z992 Dependence on renal dialysis: Secondary | ICD-10-CM | POA: Diagnosis not present

## 2020-07-19 DIAGNOSIS — N1832 Chronic kidney disease, stage 3b: Secondary | ICD-10-CM | POA: Diagnosis present

## 2020-07-19 DIAGNOSIS — I2510 Atherosclerotic heart disease of native coronary artery without angina pectoris: Secondary | ICD-10-CM | POA: Diagnosis present

## 2020-07-19 DIAGNOSIS — Z953 Presence of xenogenic heart valve: Secondary | ICD-10-CM | POA: Diagnosis not present

## 2020-07-19 DIAGNOSIS — E785 Hyperlipidemia, unspecified: Secondary | ICD-10-CM | POA: Diagnosis present

## 2020-07-19 DIAGNOSIS — Z79899 Other long term (current) drug therapy: Secondary | ICD-10-CM | POA: Diagnosis not present

## 2020-07-19 DIAGNOSIS — I5023 Acute on chronic systolic (congestive) heart failure: Secondary | ICD-10-CM | POA: Diagnosis present

## 2020-07-19 DIAGNOSIS — H409 Unspecified glaucoma: Secondary | ICD-10-CM | POA: Diagnosis present

## 2020-07-19 DIAGNOSIS — Z833 Family history of diabetes mellitus: Secondary | ICD-10-CM | POA: Diagnosis not present

## 2020-07-19 DIAGNOSIS — E876 Hypokalemia: Secondary | ICD-10-CM | POA: Diagnosis present

## 2020-07-19 DIAGNOSIS — I10 Essential (primary) hypertension: Secondary | ICD-10-CM | POA: Diagnosis present

## 2020-07-19 DIAGNOSIS — I130 Hypertensive heart and chronic kidney disease with heart failure and stage 1 through stage 4 chronic kidney disease, or unspecified chronic kidney disease: Secondary | ICD-10-CM | POA: Diagnosis not present

## 2020-07-19 DIAGNOSIS — N189 Chronic kidney disease, unspecified: Secondary | ICD-10-CM | POA: Diagnosis present

## 2020-07-19 DIAGNOSIS — E118 Type 2 diabetes mellitus with unspecified complications: Secondary | ICD-10-CM | POA: Diagnosis not present

## 2020-07-19 DIAGNOSIS — N178 Other acute kidney failure: Secondary | ICD-10-CM | POA: Diagnosis not present

## 2020-07-19 DIAGNOSIS — I509 Heart failure, unspecified: Secondary | ICD-10-CM

## 2020-07-19 DIAGNOSIS — Z951 Presence of aortocoronary bypass graft: Secondary | ICD-10-CM

## 2020-07-19 LAB — CBC
HCT: 45.3 % (ref 36.0–46.0)
HCT: 48.5 % — ABNORMAL HIGH (ref 36.0–46.0)
Hemoglobin: 15 g/dL (ref 12.0–15.0)
Hemoglobin: 16 g/dL — ABNORMAL HIGH (ref 12.0–15.0)
MCH: 29.5 pg (ref 26.0–34.0)
MCH: 30 pg (ref 26.0–34.0)
MCHC: 33.1 g/dL (ref 30.0–36.0)
MCHC: 33 g/dL (ref 30.0–36.0)
MCV: 89.2 fL (ref 80.0–100.0)
MCV: 90.8 fL (ref 80.0–100.0)
Platelets: 142 10*3/uL — ABNORMAL LOW (ref 150–400)
Platelets: 134 10*3/uL — ABNORMAL LOW (ref 150–400)
RBC: 5.08 MIL/uL (ref 3.87–5.11)
RBC: 5.34 MIL/uL — ABNORMAL HIGH (ref 3.87–5.11)
RDW: 18.1 % — ABNORMAL HIGH (ref 11.5–15.5)
RDW: 18.8 % — ABNORMAL HIGH (ref 11.5–15.5)
WBC: 5.8 10*3/uL (ref 4.0–10.5)
WBC: 7 10*3/uL (ref 4.0–10.5)
nRBC: 0.3 % — ABNORMAL HIGH (ref 0.0–0.2)
nRBC: 0 % (ref 0.0–0.2)

## 2020-07-19 LAB — BASIC METABOLIC PANEL
Anion gap: 13 (ref 5–15)
BUN: 33 mg/dL — ABNORMAL HIGH (ref 8–23)
CO2: 19 mmol/L — ABNORMAL LOW (ref 22–32)
Calcium: 9.9 mg/dL (ref 8.9–10.3)
Chloride: 111 mmol/L (ref 98–111)
Creatinine, Ser: 3.26 mg/dL — ABNORMAL HIGH (ref 0.44–1.00)
GFR calc Af Amer: 15 mL/min — ABNORMAL LOW (ref >60)
GFR calc non Af Amer: 13 mL/min — ABNORMAL LOW (ref >60)
Glucose, Bld: 151 mg/dL — ABNORMAL HIGH (ref 70–99)
Potassium: 3.7 mmol/L (ref 3.5–5.1)
Sodium: 143 mmol/L (ref 135–145)

## 2020-07-19 LAB — TROPONIN I (HIGH SENSITIVITY)
Troponin I (High Sensitivity): 35 ng/L — ABNORMAL HIGH (ref <18)
Troponin I (High Sensitivity): 40 ng/L — ABNORMAL HIGH (ref <18)

## 2020-07-19 LAB — CREATININE, SERUM
Creatinine, Ser: 2.89 mg/dL — ABNORMAL HIGH (ref 0.44–1.00)
GFR calc Af Amer: 17 mL/min — ABNORMAL LOW (ref >60)
GFR calc non Af Amer: 15 mL/min — ABNORMAL LOW (ref >60)

## 2020-07-19 LAB — BRAIN NATRIURETIC PEPTIDE: B Natriuretic Peptide: 3285.4 pg/mL — ABNORMAL HIGH (ref 0.0–100.0)

## 2020-07-19 LAB — GLUCOSE, CAPILLARY: Glucose-Capillary: 114 mg/dL — ABNORMAL HIGH (ref 70–99)

## 2020-07-19 MED ORDER — INSULIN ASPART 100 UNIT/ML ~~LOC~~ SOLN
0.0000 [IU] | Freq: Three times a day (TID) | SUBCUTANEOUS | Status: DC
  Administered 2020-07-21: 1 [IU] via SUBCUTANEOUS
  Filled 2020-07-19: qty 1

## 2020-07-19 MED ORDER — ISOSORBIDE MONONITRATE ER 30 MG PO TB24
30.0000 mg | ORAL_TABLET | Freq: Every day | ORAL | Status: DC
  Administered 2020-07-19 – 2020-07-21 (×3): 30 mg via ORAL
  Filled 2020-07-19 (×3): qty 1

## 2020-07-19 MED ORDER — DORZOLAMIDE HCL-TIMOLOL MAL 2-0.5 % OP SOLN
1.0000 [drp] | Freq: Two times a day (BID) | OPHTHALMIC | Status: DC
  Administered 2020-07-20 (×2): 1 [drp] via OPHTHALMIC
  Filled 2020-07-19: qty 10

## 2020-07-19 MED ORDER — CARVEDILOL 12.5 MG PO TABS
12.5000 mg | ORAL_TABLET | Freq: Two times a day (BID) | ORAL | Status: DC
  Administered 2020-07-19 – 2020-07-21 (×4): 12.5 mg via ORAL
  Filled 2020-07-19: qty 1
  Filled 2020-07-19 (×2): qty 2
  Filled 2020-07-19: qty 1

## 2020-07-19 MED ORDER — ROSUVASTATIN CALCIUM 10 MG PO TABS
40.0000 mg | ORAL_TABLET | Freq: Every evening | ORAL | Status: DC
  Administered 2020-07-20: 40 mg via ORAL
  Filled 2020-07-19: qty 4
  Filled 2020-07-19 (×2): qty 2

## 2020-07-19 MED ORDER — ASPIRIN EC 81 MG PO TBEC
81.0000 mg | DELAYED_RELEASE_TABLET | Freq: Every day | ORAL | Status: DC
  Administered 2020-07-20 – 2020-07-21 (×2): 81 mg via ORAL
  Filled 2020-07-19 (×2): qty 1

## 2020-07-19 MED ORDER — HEPARIN SODIUM (PORCINE) 5000 UNIT/ML IJ SOLN
5000.0000 [IU] | Freq: Three times a day (TID) | INTRAMUSCULAR | Status: DC
  Administered 2020-07-19 – 2020-07-20 (×2): 5000 [IU] via SUBCUTANEOUS
  Filled 2020-07-19 (×2): qty 1

## 2020-07-19 MED ORDER — FUROSEMIDE 10 MG/ML IJ SOLN
40.0000 mg | Freq: Once | INTRAMUSCULAR | Status: AC
  Administered 2020-07-19: 20 mg via INTRAVENOUS
  Filled 2020-07-19: qty 4

## 2020-07-19 MED ORDER — SODIUM CHLORIDE 0.9% FLUSH: 3.0000 mL | Freq: Once | INTRAVENOUS | Status: DC

## 2020-07-19 MED ORDER — FUROSEMIDE 10 MG/ML IJ SOLN
4.0000 mg/h | INTRAVENOUS | Status: DC
  Administered 2020-07-19: 4 mg/h via INTRAVENOUS
  Filled 2020-07-19: qty 25

## 2020-07-19 NOTE — H&P (Signed)
[name] [name] is an 85 y.o. female.   Chief Complaint: Shortness of breath. HPI:  Patient is 85 year old female with history of coronary disease, type 2 diabetes, essential hypertension, dyslipidemia, chronic systolic congestive heart failure who came to the emergency room complaining of increased short of breath since Sunday. Patient had a similar presentation about 3 weeks ago and admitted to the hospital. Patient states that she currently lives at home by herself, she was independent of all ADLs previously. However, since Sunday, she developed significant weakness, she became short of breath walking for a few steps. She thinks she gained a few pounds of weight. But she did not have any leg edema. She continues to sleep flat at nighttime without any paroxysmal nocturnal dyspnea. Upon arriving the emergency room, a chest x-ray showed vascular congestion. She has a BNP of 2285, creatinine of 3.26 with baseline at 2.5. In the emergency room, she only received 20 mg IV Lasix.  Past Medical History:  Diagnosis Date  . [name]    a. LHC 02/03/17 for unstable angina showed severe 3v CAD w/ sequential 70% & 80% p-mLAD, 99% mLCx, CTO mRCA, elevated LV filling pressure  . Colon polyp 2010  . Diabetes mellitus with complication (HCC)   . Glaucoma   . Hyperlipidemia   . Hypertension     Past Surgical History:  Procedure Laterality Date  . APPENDECTOMY    . BREAST BIOPSY Right 1999  . COLONOSCOPY  2010  . COLONOSCOPY WITH PROPOFOL N/A 10/07/2018   Procedure: COLONOSCOPY WITH PROPOFOL;  Surgeon: Skulskie, Martin U, MD;  Location: [facility] ENDOSCOPY;  Service: Endoscopy;  Laterality: N/A;  . CORONARY ANGIOPLASTY    . CORONARY ARTERY BYPASS GRAFT N/A 02/05/2017   Procedure: CORONARY ARTERY BYPASS GRAFTING (CABG) x 4                                                                               LIMA-LAD SEQ SVG-DIAG-OM SVG-PD;  Surgeon: Bryan K Bartle, MD;  [facility];  Service: Open  Heart Surgery;  Laterality: N/A;  . ESOPHAGOGASTRODUODENOSCOPY (EGD) WITH PROPOFOL N/A 10/07/2018   Procedure: ESOPHAGOGASTRODUODENOSCOPY (EGD) WITH PROPOFOL;  Surgeon: Skulskie, Martin U, MD;  Location: [facility] ENDOSCOPY;  Service: Endoscopy;  Laterality: N/A;  . IABP INSERTION N/A 02/04/2017   Procedure: IABP Insertion;  Surgeon: David W Harding, MD;  [facility];  Service: Cardiovascular;  Laterality: N/A;  . LEFT HEART CATH AND CORONARY ANGIOGRAPHY Left 02/03/2017   Procedure: Left Heart Cath and Coronary Angiography;  Surgeon: Christopher End, MD;  Location: [facility] INVASIVE CV LAB;  Service: Cardiovascular;  Laterality: Left;  . MITRAL VALVE REPAIR N/A 02/05/2017   Procedure: MITRAL VALVE REPLACEMENT (MVR) WITH MAGNA MITRAL EASE PERICARDIAL BIOPROSTHESIS 25 MM;  Surgeon: Bryan K Bartle, MD;  [facility];  Service: Open Heart Surgery;  Laterality: N/A;  . PARATHYROIDECTOMY     19 86  . RIGHT HEART CATH N/A 02/04/2017   Procedure: Right Heart Cath;  Surgeon: Leonie Man, MD;  Location: [place] CV LAB;  Service: Cardiovascular;  Laterality: N/A;  . RIGHT/LEFT HEART CATH AND CORONARY/GRAFT ANGIOGRAPHY N/A 11/29/2019   Procedure: RIGHT/LEFT HEART CATH AND  CORONARY/GRAFT ANGIOGRAPHY;  Surgeon: Sherren Mocha, MD;  Location: [place] CV LAB;  Service: Cardiovascular;  Laterality: N/A;  . TEE WITHOUT CARDIOVERSION N/A 02/05/2017   Procedure: TRANSESOPHAGEAL ECHOCARDIOGRAM (TEE);  Surgeon: Gaye Pollack, MD;  Location: [place];  Service: Open Heart Surgery;  Laterality: N/A;    Family History  Problem Relation Age of Onset  . Diabetes Father   . Diabetes Sister   . Bladder Cancer Neg Hx   . Kidney cancer Neg Hx   . Prostate cancer Neg Hx    Social History:  reports that she has never smoked. She has never used smokeless tobacco. She reports that she does not drink alcohol and does not use drugs.  Allergies: No Known Allergies  (Not in a hospital admission)   Results  for orders placed or performed during the hospital encounter of 07/19/20 (from the past 48 hour(s))  Basic metabolic panel     Status: Abnormal   Collection Time: 07/19/20 10:19 AM  Result Value Ref Range   Sodium 143 135 - 145 mmol/L   Potassium 3.7 3.5 - 5.1 mmol/L   Chloride 111 98 - 111 mmol/L   CO2 19 (L) 22 - 32 mmol/L   Glucose, Bld 151 (H) 70 - 99 mg/dL    Comment: Glucose reference range applies only to samples taken after fasting for at least 8 hours.   BUN 33 (H) 8 - 23 mg/dL   Creatinine, Ser 3.26 (H) 0.44 - 1.00 mg/dL   Calcium 9.9 8.9 - 10.3 mg/dL   GFR calc non Af Amer 13 (L) >60 mL/min   GFR calc Af Amer 15 (L) >60 mL/min   Anion gap 13 5 - 15    Comment: Performed at [facility], [address] [place], [place] [address]  CBC     Status: Abnormal   Collection Time: 07/19/20 10:19 AM  Result Value Ref Range   WBC 5.8 4.0 - 10.5 K/uL   RBC 5.08 3.87 - 5.11 MIL/uL   Hemoglobin 15.0 12.0 - 15.0 g/dL   HCT 45.3 36 - 46 %   MCV 89.2 80.0 - 100.0 fL   MCH 29.5 26.0 - 34.0 pg   MCHC 33.1 30.0 - 36.0 g/dL   RDW 18.1 (H) 11.5 - 15.5 %   Platelets 142 (L) 150 - 400 K/uL   nRBC 0.3 (H) 0.0 - 0.2 %    Comment: Performed at [facility], [address] [place], [place] [address]  Brain natriuretic peptide     Status: Abnormal   Collection Time: 07/19/20 10:19 AM  Result Value Ref Range   B Natriuretic Peptide 3,285.4 (H) 0.0 - 100.0 pg/mL    Comment: Performed at [facility], [place], [place] [address]  Troponin I (High Sensitivity)     Status: Abnormal   Collection Time: 07/19/20 10:19 AM  Result Value Ref Range   Troponin I (High Sensitivity) 35 (H) <18 ng/L    Comment: (NOTE) Elevated high sensitivity troponin I (hsTnI) values and significant  changes across serial measurements may suggest ACS but many other  chronic and acute conditions are known to elevate hsTnI results.  Refer to the "Links" section for chest pain  algorithms and additional  guidance. Performed at [facility], [address] [place], [place] [address]    DG Chest 2 View  Result Date: 07/19/2020 CLINICAL DATA:  Shortness of breath EXAM: CHEST - 2 VIEW COMPARISON:  07/17/2020 FINDINGS: Cardiomegaly.  Prior CABG and valve replacement. Tortuosity of the thoracic aorta with calcifications. Mild vascular congestion. No overt edema, confluent opacities or effusions. No acute bony abnormality. IMPRESSION: Cardiomegaly, vascular congestion. Electronically Signed   By: Rolm Baptise M.D.   On: 07/19/2020 10:48    Review of Systems  Constitutional: Positive for activity change, fatigue and unexpected weight change. Negative for appetite change and diaphoresis.  HENT: Negative for congestion, postnasal drip and rhinorrhea.   Eyes: Negative for pain and discharge.  Respiratory: Positive for cough and shortness of breath. Negative for choking and wheezing.   Cardiovascular: Negative for chest pain, palpitations and leg swelling.  Gastrointestinal: Positive for constipation. Negative for abdominal distention, diarrhea, nausea and vomiting.  Genitourinary: Negative for dysuria, frequency and hematuria.  Musculoskeletal: Negative for back pain and gait problem.  Neurological: Positive for dizziness and light-headedness. Negative for numbness and headaches.  Psychiatric/Behavioral: Negative for confusion and hallucinations.    Blood pressure (!) 135/104, pulse (!) 111, temperature 97.6 F (36.4 C), temperature source Oral, resp. rate 21, height 5\' 6"  (1.676 m), weight 72.6 kg, SpO2 98 %. Physical Exam Constitutional:      General: She is not in acute distress.    Appearance: She is well-developed. She is not ill-appearing or toxic-appearing.  HENT:     Head: Normocephalic and atraumatic.  Eyes:     Extraocular Movements: Extraocular movements intact.     Pupils: Pupils are equal, round, and reactive to light.  Neck:     Thyroid: No  thyromegaly.     Vascular: No hepatojugular reflux.  Cardiovascular:     Rate and Rhythm: Normal rate and regular rhythm.     Heart sounds: No murmur heard.  No gallop.   Pulmonary:     Effort: Pulmonary effort is normal. No tachypnea.     Breath sounds: Normal breath sounds.  Abdominal:     Palpations: Abdomen is soft. There is no hepatomegaly or splenomegaly.     Tenderness: There is no abdominal tenderness.  Musculoskeletal:     Cervical back: Normal range of motion and neck supple.     Right lower leg: No edema.     Left lower leg: No edema.  Skin:    General: Skin is warm and dry.  Neurological:     General: No focal deficit present.     Mental Status: She is alert and oriented to person, place, and time.  Psychiatric:        Mood and Affect: Mood normal.        Behavior: Behavior normal.      Assessment/Plan #1. Shortness of breath likely secondary to acute on chronic systolic congestive heart failure. Patient had a recent echocardiogram performed last month showed ejection fraction 40 to 45%. She had a bioprosthetic heart valve replacement in 2008, recent echocardiogram did not show any valvular abnormality. Chest x-ray showed a mild vascular congestion. However, patient may only have very mild volume overload. Due to worsening renal function recently, she will be given lower dose of Lasix drip for short period time. Will decide tomorrow if Lasix will be continued or discontinued based on renal function changes.  #2. Generalized weakness. Could be due to congestive heart of exacerbation. Will obtain physical therapy occupational therapy evaluation and treat.  3. Acute kidney injury on chronic kidney disease stage IIIb. We will follow renal function after diuretics. If renal function worse by tomorrow, will obtain nephrology consult.  4. Type 2 diabetes with chronic kidney disease. Starting  sliding scale insulin for now.  5. Prophylaxis. We will use heparin.  Sharen Hones, MD 07/19/2020, 4:20 PM

## 2020-07-19 NOTE — ED Triage Notes (Signed)
Pt presents to ED via [facility] with c/o SOB. Per EMS pt was trying to get to her car when she had SOB, per EMS pt turned around and went back inside and called 911. Per EMS pt c/o SOB and generalized weakness. Per EMS VSS en route.   Pt arrives to ED with no respiratory distress noted at this time.

## 2020-07-19 NOTE — ED Provider Notes (Signed)
[facility] Emergency Department Provider Note    First MD Initiated Contact with Patient 07/19/20 1331     (approximate)  I have reviewed the triage vital signs and the nursing notes.   HISTORY  Chief Complaint Shortness of Breath and Weakness    HPI [name] [name] is a 85 y.o. female below listed past medical history presents to the ER for evaluation of worsening exertional dyspnea as well as orthopnea. She not having any pain. Does have a history of CHF as well as chronic kidney disease on Lasix. States she been compliant with her medications but symptoms became more severe today. She currently pain-free and feels well when she is at rest but with any exertion has had significant decline in the past few days.    Past Medical History:  Diagnosis Date  . [name]    a. LHC 02/03/17 for unstable angina showed severe 3v CAD w/ sequential 70% & 80% p-mLAD, 99% mLCx, CTO mRCA, elevated LV filling pressure  . Colon polyp 2010  . Diabetes mellitus with complication ([place])   . Glaucoma   . Hyperlipidemia   . Hypertension    Family History  Problem Relation Age of Onset  . Diabetes Father   . Diabetes Sister   . Bladder Cancer Neg Hx   . Kidney cancer Neg Hx   . Prostate cancer Neg Hx    Past Surgical History:  Procedure Laterality Date  . APPENDECTOMY    . BREAST BIOPSY Right 1999  . COLONOSCOPY  2010  . COLONOSCOPY WITH PROPOFOL N/A 10/07/2018   Procedure: COLONOSCOPY WITH PROPOFOL;  Surgeon: Lollie Sails, MD;  Location: [facility] ENDOSCOPY;  Service: Endoscopy;  Laterality: N/A;  . CORONARY ANGIOPLASTY    . CORONARY ARTERY BYPASS GRAFT N/A 02/05/2017   Procedure: CORONARY ARTERY BYPASS GRAFTING (CABG) x 4                                                                               LIMA-LAD SEQ SVG-DIAG-OM SVG-PD;  Surgeon: Gaye Pollack, MD;  Location: [place] OR;  Service: Open Heart Surgery;  Laterality: N/A;  .  ESOPHAGOGASTRODUODENOSCOPY (EGD) WITH PROPOFOL N/A 10/07/2018   Procedure: ESOPHAGOGASTRODUODENOSCOPY (EGD) WITH PROPOFOL;  Surgeon: Lollie Sails, MD;  Location: [facility] ENDOSCOPY;  Service: Endoscopy;  Laterality: N/A;  . IABP INSERTION N/A 02/04/2017   Procedure: IABP Insertion;  Surgeon: Leonie Man, MD;  Location: [place] CV LAB;  Service: Cardiovascular;  Laterality: N/A;  . LEFT HEART CATH AND CORONARY ANGIOGRAPHY Left 02/03/2017   Procedure: Left Heart Cath and Coronary Angiography;  Surgeon: Nelva Bush, MD;  Location: [place] CV LAB;  Service: Cardiovascular;  Laterality: Left;  . MITRAL VALVE REPAIR N/A 02/05/2017   Procedure: MITRAL VALVE REPLACEMENT (MVR) WITH MAGNA MITRAL EASE PERICARDIAL BIOPROSTHESIS 25 MM;  Surgeon: Gaye Pollack, MD;  Location: [place] OR;  Service: Open Heart Surgery;  Laterality: N/A;  . PARATHYROIDECTOMY     1986  . RIGHT HEART CATH N/A 02/04/2017   Procedure: Right Heart Cath;  Surgeon: Leonie Man, MD;  Location: [place] CV LAB;  Service: Cardiovascular;  Laterality: N/A;  . RIGHT/LEFT  HEART CATH AND CORONARY/GRAFT ANGIOGRAPHY N/A 11/29/2019   Procedure: RIGHT/LEFT HEART CATH AND CORONARY/GRAFT ANGIOGRAPHY;  Surgeon: Sherren Mocha, MD;  Location: [place] CV LAB;  Service: Cardiovascular;  Laterality: N/A;  . TEE WITHOUT CARDIOVERSION N/A 02/05/2017   Procedure: TRANSESOPHAGEAL ECHOCARDIOGRAM (TEE);  Surgeon: Gaye Pollack, MD;  Location: [place];  Service: Open Heart Surgery;  Laterality: N/A;   Patient Active Problem List   Diagnosis Date Noted  . Generalized weakness 06/17/2020  . UTI (urinary tract infection) 06/17/2020  . Hypokalemia 06/17/2020  . Aortic stenosis 06/17/2020  . History of mitral valve replacement with bioprosthetic valve 06/17/2020  . Elevated troponin 06/17/2020  . Abnormal CT of the abdomen 06/17/2020  . Pain in limb 06/28/2019  . Chronic superficial gastritis without bleeding 01/06/2019  . Use of cane as  ambulatory aid 11/11/2018  . Falls frequently 10/21/2018  . Chronic systolic CHF (congestive heart failure) ([place]) 02/17/2018  . Hypercalcemia 06/28/2017  . PAF (paroxysmal atrial fibrillation) ([place]) 03/26/2017  . CKD (chronic kidney disease), stage IV ([place]) 03/26/2017  . [name]   . Bilateral carpal tunnel syndrome 02/06/2017  . [name] 02/05/2017  . Unstable angina ([place]) 02/03/2017  . [name] 02/03/2017  . Murmur 02/03/2017  . Acute kidney injury superimposed on CKD stage IV(HCC) 02/03/2017  . Type 2 diabetes mellitus with complication ([place]) 09/32/6712  . Essential hypertension 02/03/2017  . 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 HLD (hyperlipidemia) 04/16/2016  . Diabetic neuropathy associated with type 2 diabetes mellitus ([place]) 04/16/2016  . Absolute anemia 09/19/2014  . Encounter for screening colonoscopy 09/19/2014      Prior to Admission medications   Medication Sig Start Date End Date Taking? Authorizing Provider  aspirin EC 81 MG tablet Take 1 tablet (81 mg total) by mouth daily. 10/21/17   Bensimhon, Shaune Pascal, MD  carvedilol (COREG) 12.5 MG tablet Take 12.5 mg by mouth 2 (two) times daily.     [provider]  cefdinir (OMNICEF) 300 MG capsule Take 1 capsule (300 mg total) by mouth daily. 06/20/20   Sharen Hones, MD  dorzolamide-timolol (COSOPT) 22.3-6.8 MG/ML ophthalmic solution Place 1 drop into both eyes 2 (two) times daily.  07/20/19   [provider]  ENTRESTO 49-51 MG Take 1 tablet by mouth 2 (two) times daily.  09/09/19   [provider]  isosorbide mononitrate (IMDUR) 30 MG 24 hr tablet Take 1 tablet by mouth once daily Patient not taking: Reported on 06/17/2020 02/13/20   Sherren Mocha, MD  latanoprost (XALATAN) 0.005 % ophthalmic solution Place 1 drop into both eyes at bedtime.     [provider]  Magnesium 250 MG TABS Take 250 mg by mouth daily.    [provider]  Potassium Chloride  ER 20 MEQ TBCR TAKE 1 TABLET BY MOUTH ONCE DAILY 09/16/18   Bensimhon, Shaune Pascal, MD  rosuvastatin (CRESTOR) 40 MG tablet Take 40 mg by mouth every evening.     [provider]  torsemide (DEMADEX) 20 MG tablet Take 2 tablets (40 mg total) by mouth daily. Patient taking differently: Take 20 mg by mouth daily.  11/29/19   Sherren Mocha, MD    Allergies Patient has no known allergies.    Social History Social History   Tobacco Use  . Smoking status: Never Smoker  . Smokeless tobacco: Never Used  Vaping Use  . Vaping Use: Never used  Substance Use Topics  . Alcohol use: No  . Drug  use: Never    Review of Systems Patient denies headaches, rhinorrhea, blurry vision, numbness, shortness of breath, chest pain, edema, cough, abdominal pain, nausea, vomiting, diarrhea, dysuria, fevers, rashes or hallucinations unless otherwise stated above in HPI. ____________________________________________   PHYSICAL EXAM:  VITAL SIGNS: Vitals:   07/19/20 1425 07/19/20 1429  BP:  (!) 122/90  Pulse: 90   Resp: 18   Temp:    SpO2: 98%     Constitutional: Alert and oriented.  Eyes: Conjunctivae are normal.  Head: Atraumatic. Nose: No congestion/rhinnorhea. Mouth/Throat: Mucous membranes are moist.   Neck: No stridor. Painless ROM.  Cardiovascular: Normal rate, regular rhythm. Grossly normal heart sounds.  Good peripheral circulation. Respiratory: Normal respiratory effort.  No retractions. Lungs with dimiished posterior breathsounds Gastrointestinal: Soft and nontender. No distention. No abdominal bruits. No CVA tenderness. Genitourinary:  Musculoskeletal: No lower extremity tenderness nor edema.  No joint effusions. Neurologic:  Normal speech and language. No gross focal neurologic deficits are appreciated. No facial droop Skin:  Skin is warm, dry and intact. No rash noted. Psychiatric: Mood and affect are normal. Speech and behavior are  normal.  ____________________________________________   LABS (all labs ordered are listed, but only abnormal results are displayed)  Results for orders placed or performed during the hospital encounter of 07/19/20 (from the past 24 hour(s))  Basic metabolic panel     Status: Abnormal   Collection Time: 07/19/20 10:19 AM  Result Value Ref Range   Sodium 143 135 - 145 mmol/L   Potassium 3.7 3.5 - 5.1 mmol/L   Chloride 111 98 - 111 mmol/L   CO2 19 (L) 22 - 32 mmol/L   Glucose, Bld 151 (H) 70 - 99 mg/dL   BUN 33 (H) 8 - 23 mg/dL   Creatinine, Ser 3.26 (H) 0.44 - 1.00 mg/dL   Calcium 9.9 8.9 - 10.3 mg/dL   GFR calc non Af Amer 13 (L) >60 mL/min   GFR calc Af Amer 15 (L) >60 mL/min   Anion gap 13 5 - 15  CBC     Status: Abnormal   Collection Time: 07/19/20 10:19 AM  Result Value Ref Range   WBC 5.8 4.0 - 10.5 K/uL   RBC 5.08 3.87 - 5.11 MIL/uL   Hemoglobin 15.0 12.0 - 15.0 g/dL   HCT 45.3 36 - 46 %   MCV 89.2 80.0 - 100.0 fL   MCH 29.5 26.0 - 34.0 pg   MCHC 33.1 30.0 - 36.0 g/dL   RDW 18.1 (H) 11.5 - 15.5 %   Platelets 142 (L) 150 - 400 K/uL   nRBC 0.3 (H) 0.0 - 0.2 %  Brain natriuretic peptide     Status: Abnormal   Collection Time: 07/19/20 10:19 AM  Result Value Ref Range   B Natriuretic Peptide 3,285.4 (H) 0.0 - 100.0 pg/mL  Troponin I (High Sensitivity)     Status: Abnormal   Collection Time: 07/19/20 10:19 AM  Result Value Ref Range   Troponin I (High Sensitivity) 35 (H) <18 ng/L   ____________________________________________  EKG My review and personal interpretation at Time:10:13    Indication: sob  Rate: 110  Rhythm: afib Axis: normal Other: normal intervals, inferolateral twi ____________________________________________  RADIOLOGY  I personally reviewed all radiographic images ordered to evaluate for the above acute complaints and reviewed radiology reports and findings.  These findings were personally discussed with the patient.  Please see medical record  for radiology report.  ____________________________________________   PROCEDURES  Procedure(s) performed:  Procedures  Critical Care performed: no ____________________________________________   INITIAL IMPRESSION / ASSESSMENT AND PLAN / ED COURSE  Pertinent labs & imaging results that were available during my care of the patient were reviewed by me and considered in my medical decision making (see chart for details).   DDX: Asthma, copd, CHF, pna, ptx, malignancy, Pe, anemia  Graciemae A Stipe is a 85 y.o. who presents to the ED with symptoms as described above. Patient appears well and comfortable at rest but with ambulation becomes significantly tachypneic weak and has difficulty being able to stand. She does not show any signs of significant lower extremity edema chest x-ray does show vascular congestion. Does have mild worsening of her renal function but also elevation of BNP. It is concerning for worsening congestive heart failure and AKI. Will give dose of IV Lasix will discuss with hospitalist for observation for further medical management.     The patient was evaluated in Emergency Department today for the symptoms described in the history of present illness. He/she was evaluated in the context of the global COVID-19 pandemic, which necessitated consideration that the patient might be at risk for infection with the SARS-CoV-2 virus that causes COVID-19. Institutional protocols and algorithms that pertain to the evaluation of patients at risk for COVID-19 are in a state of rapid change based on information released by regulatory bodies including the CDC and federal and state organizations. These policies and algorithms were followed during the patient's care in the ED.  As part of my medical decision making, I reviewed the following data within the [place] notes reviewed and incorporated, Labs reviewed, notes from prior ED visits and [place] Controlled  Substance Database   ____________________________________________   FINAL CLINICAL IMPRESSION(S) / ED DIAGNOSES  Final diagnoses:  Acute on chronic congestive heart failure, unspecified heart failure type ([place])  AKI (acute kidney injury) ([place])      NEW MEDICATIONS STARTED DURING THIS VISIT:  New Prescriptions   No medications on file     Note:  This document was prepared using Dragon voice recognition software and may include unintentional dictation errors.    Merlyn Lot, MD 07/19/20 [phone]

## 2020-07-20 ENCOUNTER — Encounter: Payer: Self-pay | Admitting: Internal Medicine

## 2020-07-20 DIAGNOSIS — N178 Other acute kidney failure: Secondary | ICD-10-CM

## 2020-07-20 DIAGNOSIS — D696 Thrombocytopenia, unspecified: Secondary | ICD-10-CM

## 2020-07-20 LAB — BASIC METABOLIC PANEL
Anion gap: 9 (ref 5–15)
BUN: 34 mg/dL — ABNORMAL HIGH (ref 8–23)
CO2: 20 mmol/L — ABNORMAL LOW (ref 22–32)
Calcium: 9.4 mg/dL (ref 8.9–10.3)
Chloride: 113 mmol/L — ABNORMAL HIGH (ref 98–111)
Creatinine, Ser: 2.95 mg/dL — ABNORMAL HIGH (ref 0.44–1.00)
GFR calc Af Amer: 16 mL/min — ABNORMAL LOW (ref >60)
GFR calc non Af Amer: 14 mL/min — ABNORMAL LOW (ref >60)
Glucose, Bld: 106 mg/dL — ABNORMAL HIGH (ref 70–99)
Potassium: 3 mmol/L — ABNORMAL LOW (ref 3.5–5.1)
Sodium: 142 mmol/L (ref 135–145)

## 2020-07-20 LAB — GLUCOSE, CAPILLARY
Glucose-Capillary: 134 mg/dL — ABNORMAL HIGH (ref 70–99)
Glucose-Capillary: 145 mg/dL — ABNORMAL HIGH (ref 70–99)
Glucose-Capillary: 110 mg/dL — ABNORMAL HIGH (ref 70–99)

## 2020-07-20 LAB — CBC
HCT: 41.8 % (ref 36.0–46.0)
Hemoglobin: 14.4 g/dL (ref 12.0–15.0)
MCH: 29.9 pg (ref 26.0–34.0)
MCHC: 34.4 g/dL (ref 30.0–36.0)
MCV: 86.9 fL (ref 80.0–100.0)
Platelets: 118 10*3/uL — ABNORMAL LOW (ref 150–400)
RBC: 4.81 MIL/uL (ref 3.87–5.11)
RDW: 17.9 % — ABNORMAL HIGH (ref 11.5–15.5)
WBC: 5.9 10*3/uL (ref 4.0–10.5)
nRBC: 0 % (ref 0.0–0.2)

## 2020-07-20 LAB — MAGNESIUM: Magnesium: 2.6 mg/dL — ABNORMAL HIGH (ref 1.7–2.4)

## 2020-07-20 LAB — SARS CORONAVIRUS 2 BY RT PCR (HOSPITAL ORDER, PERFORMED IN ~~LOC~~ HOSPITAL LAB): SARS Coronavirus 2: NEGATIVE

## 2020-07-20 MED ORDER — POTASSIUM CHLORIDE CRYS ER 20 MEQ PO TBCR
40.0000 meq | EXTENDED_RELEASE_TABLET | Freq: Two times a day (BID) | ORAL | Status: AC
  Administered 2020-07-20 (×2): 40 meq via ORAL
  Filled 2020-07-20 (×2): qty 2

## 2020-07-20 MED ORDER — POTASSIUM CHLORIDE 10 MEQ/100ML IV SOLN
10.0000 meq | INTRAVENOUS | Status: AC
  Administered 2020-07-20 (×2): 10 meq via INTRAVENOUS
  Filled 2020-07-20 (×2): qty 100

## 2020-07-20 MED ORDER — FUROSEMIDE 40 MG PO TABS
40.0000 mg | ORAL_TABLET | Freq: Two times a day (BID) | ORAL | Status: DC
  Administered 2020-07-20 – 2020-07-21 (×3): 40 mg via ORAL
  Filled 2020-07-20 (×3): qty 1

## 2020-07-20 NOTE — Progress Notes (Signed)
PROGRESS NOTE    [name] [name]  TKW:[id number] DOB: [birth year] DOA: 07/19/2020 PCP: Kirk Ruths, MD   Chief complaint.  Shortness of breath.   Brief Narrative:  Patient is 85 year old female with history of coronary disease, type 2 diabetes, essential hypertension, dyslipidemia, chronic systolic congestive heart failure who came to the emergency room complaining of increased short of breath since Sunday.  Patient appeared to have a mild volume overload with acute on chronic systolic congestive heart failure.  She was given 20 mg IV Lasix followed by IV Lasix drip at 5 mg/h overnight.  7/30.  Patient was diuresed overnight, condition much better today.  Lasix changed to oral.   Assessment & Plan:   Active Problems:   Acute on chronic systolic CHF (congestive heart failure) ([place])  #1.  Acute on chronic systolic congestive heart failure. Patient feels better today.  Renal function was better after diuretics, slightly worsened this morning.  Lasix will be changed to oral.  Recheck a BMP tomorrow.  2.  Acute kidney injury on chronic kidney disease stage IIIb. Renal function stable.  Continue to follow.  3.  Generalized weakness. Continue physical therapy/Occupational Therapy.  4.  Type 2 diabetes with chronic kidney disease. Continue sliding scale insulin.  5.  Hypokalemia. Supplement.  6.  Thrombocytopenia. Likely due to diuretics.  However, will discontinue heparin.     DVT prophylaxis: SCDs Code Status: Full Family Communication: None Disposition Plan:  . Patient came from: Home            . Anticipated d/c place: Home . Barriers to d/c OR conditions which need to be met to effect a safe d/c:   Consultants:   None  Procedures: None Antimicrobials: None  Subjective: Patient feels better today, less shortness of breath.  Able to walk to the bathroom.  Dizziness is getting better. No fever or chills. No nausea vomiting abdominal  pain.  Objective: Vitals:   07/19/20 1900 07/20/20 0400 07/20/20 0500 07/20/20 1011  BP: (!) 117/95 112/82 99/79 (!) 114/91  Pulse: 82 (!) 112 (!) 38 101  Resp:  '18 18 18  ' Temp:      TempSrc:      SpO2:  95% 97% 99%  Weight:      Height:        Intake/Output Summary (Last 24 hours) at 07/20/2020 1424 Last data filed at 07/20/2020 0700 Gross per 24 hour  Intake 27.13 ml  Output 825 ml  Net -797.87 ml   Filed Weights   07/19/20 1010  Weight: 72.6 kg    Examination:  General exam: Appears calm and comfortable  Respiratory system: Decreased breathing sounds, but more air movements today. Respiratory effort normal. Cardiovascular system: S1 & S2 heard, RRR. No JVD, murmurs, rubs, gallops or clicks. No pedal edema. Gastrointestinal system: Abdomen is nondistended, soft and nontender. No organomegaly or masses felt. Normal bowel sounds heard. Central nervous system: Alert and oriented. No focal neurological deficits. Extremities: Symmetric  Skin: No rashes, lesions or ulcers Psychiatry: Judgement and insight appear normal. Mood & affect appropriate.     Data Reviewed: I have personally reviewed following labs and imaging studies  CBC: Recent Labs  Lab 07/19/20 1019 07/19/20 1616 07/20/20 0635  WBC 5.8 7.0 5.9  HGB 15.0 16.0* 14.4  HCT 45.3 48.5* 41.8  MCV 89.2 90.8 86.9  PLT 142* 134* 924*   Basic Metabolic Panel: Recent Labs  Lab 07/19/20 1019 07/19/20 1616 07/20/20 0635  NA 143  --  142  K 3.7  --  3.0*  CL 111  --  113*  CO2 19*  --  20*  GLUCOSE 151*  --  106*  BUN 33*  --  34*  CREATININE 3.26* 2.89* 2.95*  CALCIUM 9.9  --  9.4  MG  --   --  2.6*   GFR: Estimated Creatinine Clearance: 15 mL/min (A) (by C-G formula based on SCr of 2.95 mg/dL (H)). Liver Function Tests: No results for input(s): AST, ALT, ALKPHOS, BILITOT, PROT, ALBUMIN in the last 168 hours. No results for input(s): LIPASE, AMYLASE in the last 168 hours. No results for input(s):  AMMONIA in the last 168 hours. Coagulation Profile: No results for input(s): INR, PROTIME in the last 168 hours. Cardiac Enzymes: No results for input(s): CKTOTAL, CKMB, CKMBINDEX, TROPONINI in the last 168 hours. BNP (last 3 results) No results for input(s): PROBNP in the last 8760 hours. HbA1C: No results for input(s): HGBA1C in the last 72 hours. CBG: Recent Labs  Lab 07/19/20 1744 07/20/20 1246  GLUCAP 114* 134*   Lipid Profile: No results for input(s): CHOL, HDL, LDLCALC, TRIG, CHOLHDL, LDLDIRECT in the last 72 hours. Thyroid Function Tests: No results for input(s): TSH, T4TOTAL, FREET4, T3FREE, THYROIDAB in the last 72 hours. Anemia Panel: No results for input(s): VITAMINB12, FOLATE, FERRITIN, TIBC, IRON, RETICCTPCT in the last 72 hours. Sepsis Labs: No results for input(s): PROCALCITON, LATICACIDVEN in the last 168 hours.  Recent Results (from the past 240 hour(s))  SARS Coronavirus 2 by RT PCR (hospital order, performed in [facility] hospital lab) Nasopharyngeal Nasopharyngeal Swab     Status: None   Collection Time: 07/20/20 10:14 AM   Specimen: Nasopharyngeal Swab  Result Value Ref Range Status   SARS Coronavirus 2 NEGATIVE NEGATIVE Final    Comment: (NOTE) SARS-CoV-2 target nucleic acids are NOT DETECTED.  The SARS-CoV-2 RNA is generally detectable in upper and lower respiratory specimens during the acute phase of infection. The lowest concentration of SARS-CoV-2 viral copies this assay can detect is 250 copies / mL. A negative result does not preclude SARS-CoV-2 infection and should not be used as the sole basis for treatment or other patient management decisions.  A negative result may occur with improper specimen collection / handling, submission of specimen other than nasopharyngeal swab, presence of viral mutation(s) within the areas targeted by this assay, and inadequate number of viral copies (<250 copies / mL). A negative result must be combined with  clinical observations, patient history, and epidemiological information.  Fact Sheet for Patients:   StrictlyIdeas.no  Fact Sheet for Healthcare Providers: BankingDealers.co.za  This test is not yet approved or  cleared by the [place] FDA and has been authorized for detection and/or diagnosis of SARS-CoV-2 by FDA under an Emergency Use Authorization (EUA).  This EUA will remain in effect (meaning this test can be used) for the duration of the COVID-19 declaration under Section 564(b)(1) of the Act, 21 U.S.C. section 360bbb-3(b)(1), unless the authorization is terminated or revoked sooner.  Performed at [facility], [address] [place], [place] [address]          Radiology Studies: DG Chest 2 View  Result Date: 07/19/2020 CLINICAL DATA:  Shortness of breath EXAM: CHEST - 2 VIEW COMPARISON:  07/17/2020 FINDINGS: Cardiomegaly. Prior CABG and valve replacement. Tortuosity of the thoracic aorta with calcifications. Mild vascular congestion. No overt edema, confluent opacities or effusions. No acute bony abnormality. IMPRESSION: Cardiomegaly, vascular congestion. Electronically Signed  By: Rolm Baptise M.D.   On: 07/19/2020 10:48        Scheduled Meds: . aspirin EC  81 mg Oral Daily  . carvedilol  12.5 mg Oral BID WC  . dorzolamide-timolol  1 drop Both Eyes BID  . furosemide  40 mg Oral BID  . heparin  5,000 Units Subcutaneous Q8H  . insulin aspart  0-9 Units Subcutaneous TID WC  . isosorbide mononitrate  30 mg Oral Daily  . potassium chloride  40 mEq Oral BID  . rosuvastatin  40 mg Oral QPM  . sodium chloride flush  3 mL Intravenous Once   Continuous Infusions:   LOS: 1 day    Time spent: 28 minutes    Sharen Hones, MD [facility]   To contact the attending provider between 7A-7P or the covering provider during after hours 7P-7A, please log into the web site [email or web address] and access using  universal [place] password for that web site. If you do not have the password, please call the hospital operator.  07/20/2020, 2:24 PM

## 2020-07-20 NOTE — Evaluation (Signed)
Occupational Therapy Evaluation Patient Details Name: [name] [name] MRN: [id number] DOB: 10/09/1935 Today's Date: 07/20/2020    History of Present Illness Pt is an 85 year old female with PMH of CAD, DM type 2, HTN, dyslipidemia, chronic systolic CHF, CKD stage IIIb, and CABG in 2018. Per MD impression, pt presents to ED with SOB likely 2/2 acute on chronic systolic CHF, generalized weakness, AKI on CKD IIIb, DM type 2, and prophylaxis.   Clinical Impression   Ms. Luckenbaugh was seen for OT evaluation this date. Prior to hospital admission, pt was generally independent with BADL management. She endorses using a 4WW for functional mobility and sitting to shower. She states that over the last few weeks, she has become progressively weaker and more easily fatigued with exertion. Pt denies use of supplemental O2 in the home. Pt lives alone in a 1 level mobile home with a ramped entrance. She says that when she is feeling well, she enjoys travelling and watching television. Currently pt demonstrates impairments as described below (See OT problem list) which functionally limit her ability to perform ADL/self-care tasks. Pt currently requires MIN A for sitting balance, MOD A for LB ADL management including bathing and dressing in a seated position, and close CGA for functional mobility.  Pt would benefit from skilled OT services to address noted impairments and functional limitations (see below for any additional details) in order to maximize safety and independence while minimizing falls risk and caregiver burden. Upon hospital discharge, recommend STR to maximize pt safety and return to PLOF.      Follow Up Recommendations  SNF;Supervision - Intermittent    Equipment Recommendations  3 in 1 bedside commode    Recommendations for Other Services       Precautions / Restrictions Precautions Precautions: Fall Restrictions Weight Bearing Restrictions: No      Mobility Bed Mobility Overal bed  mobility: Needs Assistance Bed Mobility: Supine to Sit;Sit to Supine     Supine to sit: HOB elevated;Supervision Sit to supine: Supervision   General bed mobility comments: Deferred. Hospital transportation in room to bring pt from ED to room. Session concluded at this time. Per pt note from this date, supervision to [facility] for all bed/functional mobility.  Transfers Overall transfer level: Needs assistance Equipment used: Rolling walker (2 wheeled) Transfers: Sit to/from Stand Sit to Stand: Min guard;From elevated surface         General transfer comment: Fairly effortful and from a very high surface; close CGA provided due to unsteadiness observed in sitting    Balance Overall balance assessment: Needs assistance Sitting-balance support: Bilateral upper extremity supported;Feet unsupported;Feet supported Sitting balance-Leahy Scale: Poor Sitting balance - Comments: Pt quite unsteady in sitting with an almost constant posterior lean. Minimal ability to correct with cueing and pt appeared unaware that she was slipping. Pt required BUE support and arms continued to slide out from behind her even with cueing to readjust. Min physical assist required ~50% of the time for pt to remain upright Postural control: Posterior lean Standing balance support: Bilateral upper extremity supported Standing balance-Leahy Scale: Fair Standing balance comment: Deferred.                           ADL either performed or assessed with clinical judgement   ADL Overall ADL's : Needs assistance/impaired  General ADL Comments: Pt functionally limited by cardiopulmonary status, generalized weakness, and decreased activity tolerance. Requires significant increased time/effor to perform ADL tasks/functional mobility with CGA for safety. Educated on ECS, would benefit from further review and opportunity to implement during ADL management      Vision Patient Visual Report: No change from baseline       Perception     Praxis      Pertinent Vitals/Pain Pain Assessment: No/denies pain     Hand Dominance     Extremity/Trunk Assessment Upper Extremity Assessment Upper Extremity Assessment: Generalized weakness   Lower Extremity Assessment Lower Extremity Assessment: Generalized weakness       Communication Communication Communication: No difficulties   Cognition Arousal/Alertness: Awake/alert Behavior During Therapy: WFL for tasks assessed/performed Overall Cognitive Status: No family/caregiver present to determine baseline cognitive functioning                                 General Comments: Pt A&O x4. Generally conversational and appropriate t/o session.   General Comments  Pt HR noted to reach 109 with pt at rest. RN aware.    Exercises Total Joint Exercises Ankle Circles/Pumps: AROM;Both;10 reps Quad Sets: AROM;Both;5 reps (difficulty following cues) Straight Leg Raises: Strengthening;Both;10 reps;AAROM Long Arc Quad: AROM;Strengthening;Both;10 reps Marching in Standing: AROM;Strengthening;Both;10 reps;Standing Other Exercises Other Exercises: education regarding DME use: recommended switching to use of RW (vs SPC) Other Exercises: Pt educated on role of OT in acute setting, energy conservation strategies including pursed lip breathing, activity pacing, and routines modifications (handout provided but not reviewed in detail 2/2 transport arriving to take pt to room).   Shoulder Instructions      Home Living Family/patient expects to be discharged to:: Private residence Living Arrangements: Alone Available Help at Discharge: Neighbor;Available PRN/intermittently Type of Home: Mobile home Home Access: Ramped entrance     Home Layout: One level     Bathroom Shower/Tub: Teacher, early years/pre: Standard     Home Equipment: Cane - single point;Shower seat;Grab bars  - tub/shower;Walker - 2 wheels   Additional Comments: no falls in the last 6 months      Prior Functioning/Environment Level of Independence: Independent with assistive device(s)        Comments: ambulates with SPC the last several months and reports able to walk community distances; currently receiving HHPT 1/wk        OT Problem List: Decreased strength;Decreased coordination;Cardiopulmonary status limiting activity;Decreased activity tolerance;Decreased safety awareness;Impaired balance (sitting and/or standing);Decreased knowledge of use of DME or AE      OT Treatment/Interventions: Self-care/ADL training;Therapeutic exercise;Therapeutic activities;DME and/or AE instruction;Patient/family education;Energy conservation;Balance training    OT Goals(Current goals can be found in the care plan section) Acute Rehab OT Goals Patient Stated Goal: to get stronger OT Goal Formulation: With patient Time For Goal Achievement: 08/03/20 Potential to Achieve Goals: Good  OT Frequency: Min 2X/week   Barriers to D/C: Decreased caregiver support;Inaccessible home environment          Co-evaluation              AM-PAC OT "6 Clicks" Daily Activity     Outcome Measure Help from another person eating meals?: A Little Help from another person taking care of personal grooming?: A Little Help from another person toileting, which includes using toliet, bedpan, or urinal?: A Lot Help from another person bathing (including washing, rinsing, drying)?: A Lot Help from  another person to put on and taking off regular upper body clothing?: A Little Help from another person to put on and taking off regular lower body clothing?: A Lot 6 Click Score: 15   End of Session    Activity Tolerance: Patient tolerated treatment well Patient left: in bed;with call bell/phone within reach;with bed alarm set  OT Visit Diagnosis: Other abnormalities of gait and mobility (R26.89);Muscle weakness  (generalized) (M62.81)                Time: 8264-1583 OT Time Calculation (min): 12 min Charges:  OT General Charges $OT Visit: 1 Visit OT Evaluation $OT Eval Moderate Complexity: 1 Mod  Shara Blazing, M.S., OTR/L Ascom: [phone] 07/20/20, 4:08 PM

## 2020-07-20 NOTE — Evaluation (Signed)
Physical Therapy Evaluation Patient Details Name: [name] [name] MRN: [id number] DOB: [birth year] Today's Date: 07/20/2020   History of Present Illness  Pt is an 85 year old female with PMH of CAD, DM type 2, HTN, dyslipidemia, chronic systolic CHF, CKD stage IIIb, and CABG in 2018. Per MD impression, pt presents to ED with SOB likely 2/2 acute on chronic systolic CHF, generalized weakness, AKI on CKD IIIb, DM type 2, and prophylaxis.  Clinical Impression  Pt pleasant and motivated to participate during the session. Pt found in bed sleeping at beginning of session; easily aroused and agreeable to participate. At baseline pt HR between 96-111 and SpO2 96%. Pt A&Ox4 but appeared to have mild confusion throughout conversation as well as slowed verbal responses. Pt required supervision for bed mobility as well as mod cueing to achieve appropriate positioning (I.e. self achieved very lopsided positioning). Pt demonstrated poor sitting balance this session with a significant posterior lean with BUE support and feet unsupported. Pt continued to slide posteriorly and demonstrated minimal ability to correct with cueing; min physical assist required 50% of the time to remain upright. Pt able to sit-to-stand from an elevated surface with CGA; due to weakness, would likely require some assistance to stand from a lower surface. Pt able to ambulate ~42ft with CGA with a step-through pattern and a notable side to side sway. Pt appeared unsteady with ambulation with increased effort and mod cueing to remain within frame of RW. Pt SpO2 remained in the high 90s and HR remained high 90s-low 100s throughout session with HR noted to increase to 124 following ambulation. Pt given education / recommendation to switch to RW (from [facility]) for ambulation due to unsteadiness noted in today's session. Pt will benefit from PT services in a SNF setting upon discharge to safely address deficits listed in patient problem list for decreased  caregiver assistance and eventual return to PLOF.     Follow Up Recommendations SNF;Supervision for mobility/OOB    Equipment Recommendations  None recommended by PT    Recommendations for Other Services       Precautions / Restrictions Precautions Precautions: Fall Restrictions Weight Bearing Restrictions: No      Mobility  Bed Mobility Overal bed mobility: Needs Assistance Bed Mobility: Supine to Sit;Sit to Supine     Supine to sit: HOB elevated;Supervision Sit to supine: Supervision   General bed mobility comments: significantly increased time and effort. Did not require physical assist but appeared to have a very hard time changing posisions on her own. Required fairly significant cueing to achieve appropriate positioning  Transfers Overall transfer level: Needs assistance Equipment used: Rolling walker (2 wheeled) Transfers: Sit to/from Stand Sit to Stand: Min guard;From elevated surface         General transfer comment: Fairly effortful and from a very high surface; close CGA provided due to unsteadiness observed in sitting  Ambulation/Gait Ambulation/Gait assistance: Min guard Gait Distance (Feet): 15 Feet Assistive device: Rolling walker (2 wheeled) Gait Pattern/deviations: Step-through pattern;Drifts right/left Gait velocity: decreased   General Gait Details: Pt able to walk with a step-through pattern but notably drifted right and left; overall appeared weak and unsteady. Mod cueing required to stay closer to frame of RW. Pt requested to sit down after 37ft and further ambulation not attempted at this time.  Stairs            Wheelchair Mobility    Modified Rankin (Stroke Patients Only)       Balance Overall balance assessment: Needs assistance  Sitting-balance support: Bilateral upper extremity supported;Feet unsupported;Feet supported Sitting balance-Leahy Scale: Poor Sitting balance - Comments: Pt quite unsteady in sitting with an almost  constant posterior lean. Minimal ability to correct with cueing and pt appeared unaware that she was slipping. Pt required BUE support and arms continued to slide out from behind her even with cueing to readjust. Min physical assist required ~50% of the time for pt to remain upright Postural control: Posterior lean Standing balance support: Bilateral upper extremity supported Standing balance-Leahy Scale: Fair Standing balance comment: Posterior lean seen in sitting appeared to be corrected in standing. Pt able to stand with relatively good balance using BUE on RW. With ambulation pt appeared to have a notable sway and unable to walk within frame of RW; appeared unsteady with functional activity.                             Pertinent Vitals/Pain Pain Assessment: No/denies pain    Home Living Family/patient expects to be discharged to:: Private residence Living Arrangements: Alone Available Help at Discharge: Neighbor;Available PRN/intermittently Type of Home: Mobile home Home Access: Ramped entrance     Home Layout: One level Home Equipment: Cane - single point;Shower seat;Grab bars - tub/shower;Walker - 2 wheels Additional Comments: no falls in the last 6 months    Prior Function Level of Independence: Independent with assistive device(s)         Comments: ambulates with SPC the last several months and reports able to walk community distances; currently receiving HHPT 1/wk     Hand Dominance        Extremity/Trunk Assessment   Upper Extremity Assessment Upper Extremity Assessment: Generalized weakness    Lower Extremity Assessment Lower Extremity Assessment: Generalized weakness       Communication   Communication: No difficulties  Cognition Arousal/Alertness: Awake/alert Behavior During Therapy: WFL for tasks assessed/performed Overall Cognitive Status: No family/caregiver present to determine baseline cognitive functioning                                  General Comments: A&Ox4. Overall slow with verbal responses as well as appearing a little confused at times (stated she is 99ft 5, is 72ft 6); has a difficult time with multi-step commands      General Comments      Exercises Total Joint Exercises Ankle Circles/Pumps: AROM;Both;10 reps Quad Sets: AROM;Both;5 reps (difficulty following cues) Straight Leg Raises: Strengthening;Both;10 reps;AAROM Long Arc Quad: AROM;Strengthening;Both;10 reps Marching in Standing: AROM;Strengthening;Both;10 reps;Standing Other Exercises Other Exercises: education regarding DME use: recommended switching to use of RW (vs SPC)   Assessment/Plan    PT Assessment Patient needs continued PT services  PT Problem List Decreased strength;Decreased activity tolerance;Decreased balance;Decreased mobility;Decreased knowledge of use of DME       PT Treatment Interventions DME instruction;Gait training;Functional mobility training;Therapeutic activities;Therapeutic exercise;Balance training;Patient/family education    PT Goals (Current goals can be found in the Care Plan section)  Acute Rehab PT Goals Patient Stated Goal: to get stronger PT Goal Formulation: With patient Time For Goal Achievement: 08/02/20 Potential to Achieve Goals: Fair    Frequency Min 2X/week   Barriers to discharge Decreased caregiver support      Co-evaluation               AM-PAC PT "6 Clicks" Mobility  Outcome Measure Help needed turning from your back to your  side while in a flat bed without using bedrails?: A Little Help needed moving from lying on your back to sitting on the side of a flat bed without using bedrails?: A Little Help needed moving to and from a bed to a chair (including a wheelchair)?: A Little Help needed standing up from a chair using your arms (e.g., wheelchair or bedside chair)?: A Little Help needed to walk in hospital room?: A Lot Help needed climbing 3-5 steps with a railing? :  Total 6 Click Score: 15    End of Session Equipment Utilized During Treatment: Gait belt Activity Tolerance: Patient tolerated treatment well Patient left: in bed;with call bell/phone within reach   PT Visit Diagnosis: Unsteadiness on feet (R26.81);Muscle weakness (generalized) (M62.81);Difficulty in walking, not elsewhere classified (R26.2)    Time: 7939-0300 PT Time Calculation (min) (ACUTE ONLY): 27 min   Charges:              Felice Hope SPT 07/20/20, 3:30 PM

## 2020-07-21 DIAGNOSIS — E118 Type 2 diabetes mellitus with unspecified complications: Secondary | ICD-10-CM

## 2020-07-21 DIAGNOSIS — I10 Essential (primary) hypertension: Secondary | ICD-10-CM

## 2020-07-21 LAB — GLUCOSE, CAPILLARY
Glucose-Capillary: 97 mg/dL (ref 70–99)
Glucose-Capillary: 145 mg/dL — ABNORMAL HIGH (ref 70–99)

## 2020-07-21 LAB — CBC WITH DIFFERENTIAL/PLATELET
Abs Immature Granulocytes: 0.01 10*3/uL (ref 0.00–0.07)
Basophils Absolute: 0.1 10*3/uL (ref 0.0–0.1)
Basophils Relative: 1 %
Eosinophils Absolute: 0.1 10*3/uL (ref 0.0–0.5)
Eosinophils Relative: 1 %
HCT: 44.5 % (ref 36.0–46.0)
Hemoglobin: 14.6 g/dL (ref 12.0–15.0)
Immature Granulocytes: 0 %
Lymphocytes Relative: 29 %
Lymphs Abs: 2.2 10*3/uL (ref 0.7–4.0)
MCH: 29.4 pg (ref 26.0–34.0)
MCHC: 32.8 g/dL (ref 30.0–36.0)
MCV: 89.5 fL (ref 80.0–100.0)
Monocytes Absolute: 0.7 10*3/uL (ref 0.1–1.0)
Monocytes Relative: 9 %
Neutro Abs: 4.4 10*3/uL (ref 1.7–7.7)
Neutrophils Relative %: 60 %
Platelets: 112 10*3/uL — ABNORMAL LOW (ref 150–400)
RBC: 4.97 MIL/uL (ref 3.87–5.11)
RDW: 18.2 % — ABNORMAL HIGH (ref 11.5–15.5)
WBC: 7.3 10*3/uL (ref 4.0–10.5)
nRBC: 0.3 % — ABNORMAL HIGH (ref 0.0–0.2)

## 2020-07-21 LAB — BASIC METABOLIC PANEL
Anion gap: 8 (ref 5–15)
BUN: 32 mg/dL — ABNORMAL HIGH (ref 8–23)
CO2: 21 mmol/L — ABNORMAL LOW (ref 22–32)
Calcium: 9.1 mg/dL (ref 8.9–10.3)
Chloride: 109 mmol/L (ref 98–111)
Creatinine, Ser: 2.89 mg/dL — ABNORMAL HIGH (ref 0.44–1.00)
GFR calc Af Amer: 17 mL/min — ABNORMAL LOW (ref >60)
GFR calc non Af Amer: 15 mL/min — ABNORMAL LOW (ref >60)
Glucose, Bld: 101 mg/dL — ABNORMAL HIGH (ref 70–99)
Potassium: 3.1 mmol/L — ABNORMAL LOW (ref 3.5–5.1)
Sodium: 138 mmol/L (ref 135–145)

## 2020-07-21 LAB — MAGNESIUM: Magnesium: 2.5 mg/dL — ABNORMAL HIGH (ref 1.7–2.4)

## 2020-07-21 MED ORDER — POTASSIUM CHLORIDE 20 MEQ PO PACK
40.0000 meq | PACK | ORAL | Status: AC
  Administered 2020-07-21 (×2): 40 meq via ORAL
  Filled 2020-07-21 (×2): qty 2

## 2020-07-21 NOTE — Discharge Instructions (Signed)
Heart Failure, Self Care Heart failure is a serious condition. This document explains the things you need to do to take care of yourself after a heart failure diagnosis. You may be asked to change your diet, take certain medicines, and make other lifestyle changes in order to stay as healthy as possible. Your health care provider may also give you more specific instructions. If you have problems or questions, contact your health care provider. What are the risks? Having heart failure puts you at higher risk for certain problems. These problems can get worse if you do not take good care of yourself. Problems may include:  Blood clotting problems. This may cause a stroke.  Damage to the kidneys, liver, or lungs.  Abnormal heart rhythms. Supplies needed:  Scale for monitoring weight.  Blood pressure monitor.  Notebook.  Medicines. How to care for yourself when you have heart failure Medicines Take over-the-counter and prescription medicines only as told by your health care provider. Medicines reduce the workload of your heart, slow the progression of heart failure, and improve symptoms. Take your medicines every day.  Do not stop taking your medicine unless your health care provider tells you to do so.  Do not skip any dose of medicine.  Refill your prescriptions before you run out of medicine. Eating and drinking   Eat heart-healthy foods. Talk with a dietitian to make an eating plan that is right for you. ? Choose foods that contain no trans fat and are low in saturated fat and cholesterol. Healthy choices include fresh or frozen fruits and vegetables, fish, lean meats, legumes, fat-free or low-fat dairy products, and whole-grain or high-fiber foods. ? Limit salt (sodium) if told by your health care provider. Sodium restriction may reduce symptoms of heart failure. Ask a dietitian to recommend heart-healthy seasonings. ? Use healthy cooking methods instead of frying. Healthy  methods include roasting, grilling, broiling, baking, poaching, steaming, and stir-frying.  Limit your fluid intake, if directed by your health care provider. Fluid restriction may reduce symptoms of heart failure. Alcohol use  Do not drink alcohol if: ? Your health care provider tells you not to drink. ? Your heart was damaged by alcohol, or you have severe heart failure. ? You are pregnant, may be pregnant, or are planning to become pregnant.  If you drink alcohol: ? Limit how much you use to:  0-1 drink a day for women.  0-2 drinks a day for men. ? Be aware of how much alcohol is in your drink. In the [place] one drink equals one 12 oz bottle of beer (355 mL), one 5 oz glass of wine (148 mL), or one 1 oz glass of hard liquor (44 mL). Lifestyle   Do not use any products that contain nicotine or tobacco, such as cigarettes, e-cigarettes, and chewing tobacco. If you need help quitting, ask your health care provider. ? Do not use nicotine gum or patches before talking to your health care provider.  Do not use illegal drugs.  Work with your health care provider to safely reach the right body weight.  Do physical activity if told by your health care provider. Talk to your health care provider before you begin an exercise if: ? You are an older adult. ? You have severe heart failure.  Learn to manage stress. If you need help to do this, ask your health care provider.  Participate in or seek rehabilitation as needed to keep or improve your independence and quality of life.  Plan rest periods when you get tired. Monitoring important information   Weigh yourself every day. This will help you to notice if too much fluid is building up in your body. ? Weigh yourself every morning after you urinate and before you eat breakfast. ? Wear the same amount of clothing each time you weigh yourself. ? Record your daily weight. Provide your health care provider with your weight  record.  Monitor and record your pulse and blood pressure as told by your health care provider. Dealing with extreme temperatures  If the weather is extremely hot: ? Avoid vigorous physical activity. ? Use air conditioning or fans, or find a cooler location. ? Avoid caffeine and alcohol. ? Wear loose-fitting, lightweight, and light-colored clothing.  If the weather is extremely cold: ? Avoid vigorous activity. ? Layer your clothes. ? Wear mittens or gloves, a hat, and a scarf when you go outside. ? Avoid alcohol. Follow these instructions at home:  Stay up to date with vaccines. Pneumococcal and flu (influenza) vaccines are especially important in preventing infections of the airways.  Keep all follow-up visits as told by your health care provider. This is important. Contact a health care provider if you:  Have a rapid weight gain.  Have increasing shortness of breath.  Are unable to participate in your usual physical activities.  Get tired easily.  Cough more than normal, especially with physical activity.  Lose your appetite or feel nauseous.  Have any swelling or more swelling in areas such as your hands, feet, ankles, or abdomen.  Are unable to sleep because it is hard to breathe.  Feel like your heart is beating quickly (palpitations).  Become dizzy or light-headed when you stand up. Get help right away if you:  Have trouble breathing.  Notice or your family notices a change in your awareness, such as having trouble staying awake or concentrating.  Have pain or discomfort in your chest.  Have an episode of fainting (syncope). These symptoms may represent a serious problem that is an emergency. Do not wait to see if the symptoms will go away. Get medical help right away. Call your local emergency services (911 in the [place] Do not drive yourself to the hospital. Summary  Heart failure is a serious condition. To care for yourself, you may be asked to change  your diet, take certain medicines, and make other lifestyle changes.  Take your medicines every day. Do not stop taking them unless your health care provider tells you to do so.  Eat heart-healthy foods, such as fresh or frozen fruits and vegetables, fish, lean meats, legumes, fat-free or low-fat dairy products, and whole-grain or high-fiber foods.  Ask your health care provider if you have any alcohol restrictions. You may have to stop drinking alcohol if you have severe heart failure.  Contact your health care provider if you notice problems, such as rapid weight gain or a fast heartbeat. Get help right away if you faint, or have chest pain or trouble breathing. This information is not intended to replace advice given to you by your health care provider. Make sure you discuss any questions you have with your health care provider. Document Revised: 03/22/2019 Document Reviewed: 03/23/2019 Elsevier Patient Education  [place].   Heart Failure, Diagnosis  Heart failure means that your heart is not able to pump blood in the right way. This makes it hard for your body to work well. Heart failure is usually a long-term (chronic) condition. You must  take good care of yourself and follow your treatment plan from your doctor. What are the causes? This condition may be caused by:  High blood pressure.  Build up of cholesterol and fat in the arteries.  Heart attack. This injures the heart muscle.  Heart valves that do not open and close properly.  Damage of the heart muscle. This is also called cardiomyopathy.  Lung disease.  Abnormal heart rhythms. What increases the risk? The risk of heart failure goes up as a person ages. This condition is also more likely to develop in people who:  Are overweight.  Are female.  Smoke or chew tobacco.  Abuse alcohol or illegal drugs.  Have taken medicines that can damage the heart.  Have diabetes.  Have abnormal heart  rhythms.  Have thyroid problems.  Have low blood counts (anemia). What are the signs or symptoms? Symptoms of this condition include:  Shortness of breath.  Coughing.  Swelling of the feet, ankles, legs, or belly.  Losing weight for no reason.  Trouble breathing.  Waking from sleep because of the need to sit up and get more air.  Rapid heartbeat.  Being very tired.  Feeling dizzy, or feeling like you may pass out (faint).  Having no desire to eat.  Feeling like you may vomit (nauseous).  Peeing (urinating) more at night.  Feeling confused. How is this treated?     This condition may be treated with:  Medicines. These can be given to treat blood pressure and to make the heart muscles stronger.  Changes in your daily life. These may include eating a healthy diet, staying at a healthy body weight, quitting tobacco and illegal drug use, or doing exercises.  Surgery. Surgery can be done to open blocked valves, or to put devices in the heart, such as pacemakers.  A donor heart (heart transplant). You will receive a healthy heart from a donor. Follow these instructions at home:  Treat other conditions as told by your doctor. These may include high blood pressure, diabetes, thyroid disease, or abnormal heart rhythms.  Learn as much as you can about heart failure.  Get support as you need it.  Keep all follow-up visits as told by your doctor. This is important. Summary  Heart failure means that your heart is not able to pump blood in the right way.  This condition is caused by high blood pressure, heart attack, or damage of the heart muscle.  Symptoms of this condition include shortness of breath and swelling of the feet, ankles, legs, or belly. You may also feel very tired or feel like you may vomit.  You may be treated with medicines, surgery, or changes in your daily life.  Treat other health conditions as told by your doctor. This information is not  intended to replace advice given to you by your health care provider. Make sure you discuss any questions you have with your health care provider. Document Revised: 02/25/2019 Document Reviewed: 02/25/2019 Elsevier Patient Education  [place]. 1.  Heart healthy diet with low-salt. 2.  Fluid restriction less than 1800 mL/day. 3.  Call PCP if weight gain more than 2 pounds.

## 2020-07-21 NOTE — Progress Notes (Signed)
Patient is alert and oriented denies any complaint of pain or any needs at this time.

## 2020-07-21 NOTE — Discharge Summary (Signed)
Physician Discharge Summary  Patient ID: [name] [name] MRN: [id number] DOB/AGE: Dec 13, 1935 85 y.o.  Admit date: 07/19/2020 Discharge date: 07/21/2020  Admission Diagnoses:  Discharge Diagnoses:  Active Problems:   Acute renal failure superimposed on chronic kidney disease (HCC)   Type 2 diabetes mellitus with complication (HCC)   Essential hypertension   Acute on chronic systolic CHF (congestive heart failure) ([place])   Thrombocytopenia ([place])   Discharged Condition: good  Hospital Course:  Patient is 85 year old female with history of coronary disease, type 2 diabetes, essential hypertension, dyslipidemia, chronic systolic congestive heart failure who came to the emergency room complaining of increased short of breath since Sunday.  Patient appeared to have a mild volume overload with acute on chronic systolic congestive heart failure.  She was given 20 mg IV Lasix followed by IV Lasix drip at 5 mg/h overnight.  7/30.  Patient was diuresed overnight, condition much better today.  Lasix changed to oral.  7/31.  Patient condition continued to improve.  She was able to walk with physical therapy, she does not have any short of breath anymore.  She is medically stable to be discharged.  #1.  Acute on chronic systolic congestive heart failure. Patient feels better today.  Renal function was better after diuretics.  Patient only had a mild volume overload, she was Lasix drip for about 12 hours.  Condition had improved.  She is advised to continue her prescribed dose for torsemide.  And to follow-up with her cardiology as well as family doctor.  2.  Acute kidney injury on chronic kidney disease stage IIIb. Renal function stable.  Renal function is better after the diuretics.  3.  Generalized weakness. Continue physical therapy at home.  4.  Type 2 diabetes with chronic kidney disease. Follow-up with PCP as outpatient.  5.  Hypokalemia. Supplemented.  6.   Thrombocytopenia. Present at time of admission.  Not due to heparin.  But heparin was discontinued.  Platelets still stable at 112.  Please repeat CBC at the next office visit with PCP.    Consults: None  Significant Diagnostic Studies:  CHEST - 2 VIEW  COMPARISON:  07/17/2020  FINDINGS: Cardiomegaly. Prior CABG and valve replacement. Tortuosity of the thoracic aorta with calcifications. Mild vascular congestion. No overt edema, confluent opacities or effusions. No acute bony abnormality.  IMPRESSION: Cardiomegaly, vascular congestion.   Electronically Signed   By: Rolm Baptise M.D.   On: 07/19/2020 10:48   Treatments: IV Lasix drip.  Discharge Exam: Blood pressure 97/72, pulse 87, temperature 97.7 F (36.5 C), resp. rate 18, height 5\' 6"  (1.676 m), weight 69.1 kg, SpO2 96 %. General appearance: alert and cooperative Resp: clear to auscultation bilaterally Cardio: regular rate and rhythm, S1, S2 normal, no murmur, click, rub or gallop GI: soft, non-tender; bowel sounds normal; no masses,  no organomegaly Extremities: extremities normal, atraumatic, no cyanosis or edema  Disposition: Discharge disposition: 01-Home or Self Care       Discharge Instructions    Diet - low sodium heart healthy   Complete by: As directed    Increase activity slowly   Complete by: As directed      Allergies as of 07/21/2020   No Known Allergies     Medication List    STOP taking these medications   cefdinir 300 MG capsule Commonly known as: OMNICEF     TAKE these medications   aspirin EC 81 MG tablet Take 1 tablet (81 mg total) by mouth daily.  carvedilol 12.5 MG tablet Commonly known as: COREG Take 12.5 mg by mouth 2 (two) times daily.   dorzolamide-timolol 22.3-6.8 MG/ML ophthalmic solution Commonly known as: COSOPT Place 1 drop into both eyes 2 (two) times daily.   Entresto 49-51 MG Generic drug: sacubitril-valsartan Take 1 tablet by mouth 2 (two) times  daily.   isosorbide mononitrate 30 MG 24 hr tablet Commonly known as: IMDUR Take 1 tablet by mouth once daily   Magnesium 250 MG Tabs Take 250 mg by mouth daily.   Potassium Chloride ER 20 MEQ Tbcr TAKE 1 TABLET BY MOUTH ONCE DAILY   rosuvastatin 40 MG tablet Commonly known as: CRESTOR Take 40 mg by mouth every evening.   torsemide 20 MG tablet Commonly known as: DEMADEX Take 2 tablets (40 mg total) by mouth daily. What changed: how much to take       Follow-up Information    [place] [facility] Follow up on 08/07/2020.   Specialty: Cardiology Why: at 11:30am. Enter through the [place] entrance Contact information: [address]       Kirk Ruths, MD Follow up in 1 week(s).   Specialty: Internal Medicine Contact information: [address] [facility] [address] [place] [address] [phone]             31 minutes  Signed: Sharen Hones 07/21/2020, 10:00 AM

## 2020-07-27 ENCOUNTER — Ambulatory Visit (INDEPENDENT_AMBULATORY_CARE_PROVIDER_SITE_OTHER): Payer: Medicare HMO

## 2020-07-27 ENCOUNTER — Ambulatory Visit (INDEPENDENT_AMBULATORY_CARE_PROVIDER_SITE_OTHER): Payer: Medicare Other | Admitting: Nurse Practitioner

## 2020-07-27 ENCOUNTER — Encounter (INDEPENDENT_AMBULATORY_CARE_PROVIDER_SITE_OTHER): Payer: Self-pay | Admitting: Nurse Practitioner

## 2020-07-27 ENCOUNTER — Other Ambulatory Visit: Payer: Self-pay

## 2020-07-27 VITALS — BP 94/67 | HR 82 | Resp 16 | Ht 65.0 in | Wt 163.0 lb

## 2020-07-27 DIAGNOSIS — N184 Chronic kidney disease, stage 4 (severe): Secondary | ICD-10-CM

## 2020-07-27 DIAGNOSIS — I5022 Chronic systolic (congestive) heart failure: Secondary | ICD-10-CM

## 2020-07-27 DIAGNOSIS — I739 Peripheral vascular disease, unspecified: Secondary | ICD-10-CM

## 2020-07-30 ENCOUNTER — Telehealth: Payer: Self-pay | Admitting: Radiology

## 2020-07-30 ENCOUNTER — Encounter (INDEPENDENT_AMBULATORY_CARE_PROVIDER_SITE_OTHER): Payer: Self-pay | Admitting: Nurse Practitioner

## 2020-07-30 NOTE — Progress Notes (Signed)
Subjective:    Patient ID: [name] [name], female    DOB: 07-13-1935, 85 y.o.   MRN: [id number] Chief Complaint  Patient presents with  . Follow-up    ultrasound    The patient returns to the office for followup and review of the noninvasive studies. There have been no interval changes in lower extremity symptoms. No interval shortening of the patient's claudication distance or development of rest pain symptoms. No new ulcers or wounds have occurred since the last visit.  There have been no significant changes to the patient's overall health care.  The patient denies amaurosis fugax or recent TIA symptoms. There are no recent neurological changes noted. The patient denies history of DVT, PE or superficial thrombophlebitis. The patient denies recent episodes of angina or shortness of breath.   Bilateral ABIs are noncompressible TBI Rt=0.57 and Lt=0.00  (previous ABI's Rt=0.67 and Lt=0.72) Duplex ultrasound of the right lower extremity reveals biphasic waveforms from the common femoral artery to the proximal popliteal artery where it transitions to monophasic waveforms.  The left lower extremity has biphasic waveforms down to the level of the distal popliteal with monophasic waveforms in the anterior tibial artery and posterior tibial artery.  There is noted collaterals at the distal SFA.  There is also a Baker's cyst in the posterior knee measuring 4.07 cm x 0.8 cm x 2.11 cm   Review of Systems  Musculoskeletal: Positive for gait problem.  All other systems reviewed and are negative.      Objective:   Physical Exam Vitals reviewed.  HENT:     Head: Normocephalic.  Cardiovascular:     Rate and Rhythm: Normal rate.     Pulses:          Dorsalis pedis pulses are 1+ on the right side and 0 on the left side.       Posterior tibial pulses are 1+ on the right side and detected w/ Doppler on the left side.  Pulmonary:     Effort: Pulmonary effort is normal.  Musculoskeletal:      Right lower leg: Edema present.     Left lower leg: Edema present.  Skin:    Capillary Refill: Capillary refill takes less than 2 seconds.  Neurological:     Mental Status: She is alert.     Motor: Weakness present.     Gait: Gait abnormal.  Psychiatric:        Mood and Affect: Mood normal.        Behavior: Behavior normal.        Thought Content: Thought content normal.        Judgment: Judgment normal.     BP 94/67 (BP Location: Right Arm)   Pulse 82   Resp 16   Ht 5\' 5"  (1.651 m)   Wt 163 lb (73.9 kg)   BMI 27.12 kg/m   Past Medical History:  Diagnosis Date  . [name]    a. LHC 02/03/17 for unstable angina showed severe 3v CAD w/ sequential 70% & 80% p-mLAD, 99% mLCx, CTO mRCA, elevated LV filling pressure  . Colon polyp 2010  . Diabetes mellitus with complication ([place])   . Glaucoma   . Hyperlipidemia   . Hypertension     Social History   Socioeconomic History  . Marital status: Widowed    Spouse name: Not on file  . Number of children: Not on file  . Years of education: Not on file  . Highest  education level: Not on file  Occupational History  . Not on file  Tobacco Use  . Smoking status: Never Smoker  . Smokeless tobacco: Never Used  Vaping Use  . Vaping Use: Never used  Substance and Sexual Activity  . Alcohol use: No  . Drug use: Never  . Sexual activity: Not Currently  Other Topics Concern  . Not on file  Social History Narrative  . Not on file   Social Determinants of Health   Financial Resource Strain:   . Difficulty of Paying Living Expenses:   Food Insecurity:   . Worried About Charity fundraiser in the Last Year:   . Arboriculturist in the Last Year:   Transportation Needs:   . Film/video editor (Medical):   Marland Kitchen Lack of Transportation (Non-Medical):   Physical Activity:   . Days of Exercise per Week:   . Minutes of Exercise per Session:   Stress:   . Feeling of Stress :   Social Connections:   . Frequency of  Communication with Friends and Family:   . Frequency of Social Gatherings with Friends and Family:   . Attends Religious Services:   . Active Member of Clubs or Organizations:   . Attends Archivist Meetings:   Marland Kitchen Marital Status:   Intimate Partner Violence:   . Fear of Current or Ex-Partner:   . Emotionally Abused:   Marland Kitchen Physically Abused:   . Sexually Abused:     Past Surgical History:  Procedure Laterality Date  . APPENDECTOMY    . BREAST BIOPSY Right 1999  . COLONOSCOPY  2010  . COLONOSCOPY WITH PROPOFOL N/A 10/07/2018   Procedure: COLONOSCOPY WITH PROPOFOL;  Surgeon: Lollie Sails, MD;  Location[facility] ENDOSCOPY;  Service: Endoscopy;  Laterality: N/A;  . CORONARY ANGIOPLASTY    . CORONARY ARTERY BYPASS GRAFT N/A 02/05/2017   Procedure: CORONARY ARTERY BYPASS GRAFTING (CABG) x 4                                                                               LIMA-LAD SEQ SVG-DIAG-OM SVG-PD;  Surgeon: Gaye Pollack, MD;  Location: [facility] OR;  Service: Open Heart Surgery;  Laterality: N/A;  . ESOPHAGOGASTRODUODENOSCOPY (EGD) WITH PROPOFOL N/A 10/07/2018   Procedure: ESOPHAGOGASTRODUODENOSCOPY (EGD) WITH PROPOFOL;  Surgeon: Lollie Sails, MD;  Location: [facility] ENDOSCOPY;  Service: Endoscopy;  Laterality: N/A;  . IABP INSERTION N/A 02/04/2017   Procedure: IABP Insertion;  Surgeon: Leonie Man, MD;  Location: [place] CV LAB;  Service: Cardiovascular;  Laterality: N/A;  . LEFT HEART CATH AND CORONARY ANGIOGRAPHY Left 02/03/2017   Procedure: Left Heart Cath and Coronary Angiography;  Surgeon: Nelva Bush, MD;  Location: [place] CV LAB;  Service: Cardiovascular;  Laterality: Left;  . MITRAL VALVE REPAIR N/A 02/05/2017   Procedure: MITRAL VALVE REPLACEMENT (MVR) WITH MAGNA MITRAL EASE PERICARDIAL BIOPROSTHESIS 25 MM;  Surgeon: Gaye Pollack, MD;  Location: [facility] OR;  Service: Open Heart Surgery;  Laterality: N/A;  . PARATHYROIDECTOMY     1986  . RIGHT HEART CATH N/A  02/04/2017   Procedure: Right Heart Cath;  Surgeon: Leonie Man, MD;  Location: [facility]  INVASIVE CV LAB;  Service: Cardiovascular;  Laterality: N/A;  . RIGHT/LEFT HEART CATH AND CORONARY/GRAFT ANGIOGRAPHY N/A 11/29/2019   Procedure: RIGHT/LEFT HEART CATH AND CORONARY/GRAFT ANGIOGRAPHY;  Surgeon: Sherren Mocha, MD;  Location: [place] CV LAB;  Service: Cardiovascular;  Laterality: N/A;  . TEE WITHOUT CARDIOVERSION N/A 02/05/2017   Procedure: TRANSESOPHAGEAL ECHOCARDIOGRAM (TEE);  Surgeon: Gaye Pollack, MD;  Location: [facility];  Service: Open Heart Surgery;  Laterality: N/A;    Family History  Problem Relation Age of Onset  . Diabetes Father   . Diabetes Sister   . Bladder Cancer Neg Hx   . Kidney cancer Neg Hx   . Prostate cancer Neg Hx     No Known Allergies     Assessment & Plan:   1. PAD (peripheral artery disease) ([place]) Discussed with patient at the findings of her noninvasive studies.  Due to the flat waveforms seen on left lower extremity I have recommended angiogram.  Complicating matters somewhat is the patient's renal status.  I discussed with the patient and her daughter the signs symptoms of ischemia as well as possible outcomes from her not undergoing angiogram, which could include emergent angiogram and/or possible amputation if the patient had acute ischemia.  The patient daughter wish to discuss with her nephrologist before proceeding.  They will contact her office if they wish to proceed.  Otherwise we will maintain close follow-up and have him return in 3 months or sooner if issues should arise.  2. Chronic systolic CHF (congestive heart failure) (HCC) The dampened waveforms can be a result of of severe CHF however the patient has an estimated EF of 40 to 45%.  Typically decreased perfusion is seen with EF of less than 20%.  3. CKD (chronic kidney disease), stage IV ([place]) The patient does have a GFR of 17.  This would complicate any angiogram due to dye.  We will need to  hydrate the kidneys thoroughly.  Based aspirin is possible.   Current Outpatient Medications on File Prior to Visit  Medication Sig Dispense Refill  . aspirin EC 81 MG tablet Take 1 tablet (81 mg total) by mouth daily. 90 tablet 3  . carvedilol (COREG) 12.5 MG tablet Take 12.5 mg by mouth 2 (two) times daily.     . dorzolamide-timolol (COSOPT) 22.3-6.8 MG/ML ophthalmic solution Place 1 drop into both eyes 2 (two) times daily.     Marland Kitchen ENTRESTO 49-51 MG Take 1 tablet by mouth 2 (two) times daily.     . isosorbide mononitrate (IMDUR) 30 MG 24 hr tablet Take 1 tablet by mouth once daily 90 tablet 0  . Magnesium 250 MG TABS Take 250 mg by mouth daily.    . Potassium Chloride ER 20 MEQ TBCR TAKE 1 TABLET BY MOUTH ONCE DAILY 90 tablet 1  . rosuvastatin (CRESTOR) 40 MG tablet Take 40 mg by mouth every evening.     . torsemide (DEMADEX) 20 MG tablet Take 2 tablets (40 mg total) by mouth daily. (Patient taking differently: Take 20 mg by mouth daily. ) 60 tablet 5   No current facility-administered medications on file prior to visit.    There are no Patient Instructions on file for this visit. No follow-ups on file.   Kris Hartmann, NP

## 2020-07-30 NOTE — Telephone Encounter (Signed)
Patient states she has blood spots in her underwear that is not in her urine. Per Debroah Loop, PA, advised patient to discuss this with her OB/GYN as it may be related to fibroids. Patient verbalized understanding.

## 2020-07-31 ENCOUNTER — Ambulatory Visit: Payer: Medicare HMO | Admitting: Physician Assistant

## 2020-08-03 ENCOUNTER — Telehealth: Payer: Self-pay | Admitting: Family

## 2020-08-03 ENCOUNTER — Ambulatory Visit: Payer: Medicare HMO | Admitting: Family

## 2020-08-03 NOTE — Telephone Encounter (Signed)
Patient did not show for her [facility] appointment on 08/03/20. Will attempt to reschedule.

## 2020-08-07 ENCOUNTER — Ambulatory Visit: Payer: Medicare HMO | Admitting: Family

## 2020-10-04 ENCOUNTER — Telehealth: Payer: Self-pay

## 2020-10-04 NOTE — Telephone Encounter (Signed)
Pt due for follow-up Echocardiogram and OV with Dr Burt Knack in December to evaluate structural heart disease.  I left the pt a message to contact the office to arrange.

## 2020-10-30 ENCOUNTER — Encounter (INDEPENDENT_AMBULATORY_CARE_PROVIDER_SITE_OTHER): Payer: Medicare HMO

## 2020-10-30 ENCOUNTER — Ambulatory Visit (INDEPENDENT_AMBULATORY_CARE_PROVIDER_SITE_OTHER): Payer: Medicare HMO | Admitting: Vascular Surgery

## 2020-11-05 ENCOUNTER — Other Ambulatory Visit (INDEPENDENT_AMBULATORY_CARE_PROVIDER_SITE_OTHER): Payer: Self-pay | Admitting: Nurse Practitioner

## 2020-11-05 DIAGNOSIS — I739 Peripheral vascular disease, unspecified: Secondary | ICD-10-CM

## 2020-11-06 ENCOUNTER — Encounter (INDEPENDENT_AMBULATORY_CARE_PROVIDER_SITE_OTHER): Payer: Medicare HMO

## 2020-11-06 ENCOUNTER — Ambulatory Visit (INDEPENDENT_AMBULATORY_CARE_PROVIDER_SITE_OTHER): Payer: Medicare HMO | Admitting: Vascular Surgery

## 2020-11-21 ENCOUNTER — Other Ambulatory Visit: Payer: Self-pay

## 2020-11-21 ENCOUNTER — Ambulatory Visit
Admission: RE | Admit: 2020-11-21 | Discharge: 2020-11-21 | Disposition: A | Discharge status: Home / Self Care | Payer: Self-pay | Source: Ambulatory Visit | Attending: Cardiovascular Disease | Admitting: Cardiovascular Disease

## 2020-11-21 DIAGNOSIS — I350 Nonrheumatic aortic (valve) stenosis: Secondary | ICD-10-CM

## 2020-12-22 DEATH — deceased

## 2021-01-14 ENCOUNTER — Telehealth: Payer: Self-pay

## 2021-01-14 ENCOUNTER — Ambulatory Visit: Payer: Medicare Other | Admitting: Cardiovascular Disease

## 2021-01-14 NOTE — Telephone Encounter (Signed)
The patient was a no-show to her appointment with Dr. Burt Knack this morning.  Per Dr. Burt Knack, the patient is being followed closely by Dr. Clayborn Bigness. The Structural [facility] Team will not reschedule an appointment at this time and Dr. Clayborn Bigness will notifty the office if needed.
